# Patient Record
Sex: Female | Born: 1935 | Race: White | Hispanic: No | Smoking: Former smoker
Health system: Southern US, Community
[De-identification: ages and names within clinical notes are randomized; demographics above are authoritative.]

## PROBLEM LIST (undated history)

## (undated) DIAGNOSIS — N189 Chronic kidney disease, unspecified: Secondary | ICD-10-CM

## (undated) DIAGNOSIS — R911 Solitary pulmonary nodule: Secondary | ICD-10-CM

## (undated) DIAGNOSIS — I4819 Other persistent atrial fibrillation: Secondary | ICD-10-CM

## (undated) DIAGNOSIS — E039 Hypothyroidism, unspecified: Secondary | ICD-10-CM

## (undated) DIAGNOSIS — B9681 Helicobacter pylori [H. pylori] as the cause of diseases classified elsewhere: Secondary | ICD-10-CM

## (undated) DIAGNOSIS — C8409 Mycosis fungoides, extranodal and solid organ sites: Secondary | ICD-10-CM

## (undated) DIAGNOSIS — I10 Essential (primary) hypertension: Secondary | ICD-10-CM

## (undated) DIAGNOSIS — E8881 Metabolic syndrome: Secondary | ICD-10-CM

## (undated) DIAGNOSIS — E559 Vitamin D deficiency, unspecified: Secondary | ICD-10-CM

## (undated) DIAGNOSIS — K449 Diaphragmatic hernia without obstruction or gangrene: Secondary | ICD-10-CM

## (undated) DIAGNOSIS — Z9849 Cataract extraction status, unspecified eye: Secondary | ICD-10-CM

## (undated) DIAGNOSIS — E782 Mixed hyperlipidemia: Secondary | ICD-10-CM

## (undated) DIAGNOSIS — D1779 Benign lipomatous neoplasm of other sites: Secondary | ICD-10-CM

## (undated) DIAGNOSIS — I422 Other hypertrophic cardiomyopathy: Secondary | ICD-10-CM

## (undated) DIAGNOSIS — D1771 Benign lipomatous neoplasm of kidney: Secondary | ICD-10-CM

## (undated) DIAGNOSIS — K5730 Diverticulosis of large intestine without perforation or abscess without bleeding: Secondary | ICD-10-CM

## (undated) DIAGNOSIS — E871 Hypo-osmolality and hyponatremia: Secondary | ICD-10-CM

## (undated) DIAGNOSIS — K635 Polyp of colon: Secondary | ICD-10-CM

## (undated) DIAGNOSIS — M1990 Unspecified osteoarthritis, unspecified site: Secondary | ICD-10-CM

## (undated) DIAGNOSIS — IMO0001 Reserved for inherently not codable concepts without codable children: Secondary | ICD-10-CM

## (undated) DIAGNOSIS — M810 Age-related osteoporosis without current pathological fracture: Secondary | ICD-10-CM

## (undated) DIAGNOSIS — D649 Anemia, unspecified: Secondary | ICD-10-CM

## (undated) DIAGNOSIS — K259 Gastric ulcer, unspecified as acute or chronic, without hemorrhage or perforation: Secondary | ICD-10-CM

## (undated) HISTORY — DX: Diaphragmatic hernia without obstruction or gangrene: K44.9

## (undated) HISTORY — DX: Other hypertrophic cardiomyopathy: I42.2

## (undated) HISTORY — DX: Cataract extraction status, unspecified eye: Z98.49

## (undated) HISTORY — DX: Benign lipomatous neoplasm of other sites: D17.79

## (undated) HISTORY — DX: Chronic kidney disease, unspecified: N18.9

## (undated) HISTORY — DX: Polyp of colon: K63.5

## (undated) HISTORY — DX: Metabolic syndrome: E88.810

## (undated) HISTORY — DX: Metabolic syndrome: E88.81

## (undated) HISTORY — DX: Anemia, unspecified: D64.9

## (undated) HISTORY — DX: Other persistent atrial fibrillation: I48.19

## (undated) HISTORY — PX: OTHER SURGICAL HISTORY: SHX169

## (undated) HISTORY — DX: Benign lipomatous neoplasm of kidney: D17.71

---

## 1998-03-17 ENCOUNTER — Other Ambulatory Visit: Admission: RE | Admit: 1998-03-17 | Discharge: 1998-03-17 | Payer: Self-pay | Admitting: Obstetrics and Gynecology

## 1999-05-18 ENCOUNTER — Other Ambulatory Visit: Admission: RE | Admit: 1999-05-18 | Discharge: 1999-05-18 | Payer: Self-pay | Admitting: Obstetrics and Gynecology

## 2000-08-29 ENCOUNTER — Other Ambulatory Visit: Admission: RE | Admit: 2000-08-29 | Discharge: 2000-08-29 | Payer: Self-pay | Admitting: Obstetrics and Gynecology

## 2000-09-19 ENCOUNTER — Encounter: Payer: Self-pay | Admitting: Internal Medicine

## 2001-02-25 ENCOUNTER — Emergency Department (HOSPITAL_COMMUNITY): Admission: EM | Admit: 2001-02-25 | Discharge: 2001-02-25 | Payer: Self-pay | Admitting: Emergency Medicine

## 2001-02-25 ENCOUNTER — Encounter: Payer: Self-pay | Admitting: Emergency Medicine

## 2002-05-18 ENCOUNTER — Other Ambulatory Visit: Admission: RE | Admit: 2002-05-18 | Discharge: 2002-05-18 | Payer: Self-pay | Admitting: Obstetrics and Gynecology

## 2003-12-02 ENCOUNTER — Encounter: Payer: Self-pay | Admitting: Internal Medicine

## 2004-03-29 ENCOUNTER — Encounter: Payer: Self-pay | Admitting: Internal Medicine

## 2004-05-10 ENCOUNTER — Ambulatory Visit: Payer: Self-pay | Admitting: Internal Medicine

## 2004-06-09 ENCOUNTER — Ambulatory Visit: Payer: Self-pay | Admitting: Cardiology

## 2004-06-23 ENCOUNTER — Ambulatory Visit: Payer: Self-pay | Admitting: Cardiovascular Disease

## 2004-11-02 ENCOUNTER — Ambulatory Visit: Payer: Self-pay | Admitting: Internal Medicine

## 2004-12-07 ENCOUNTER — Ambulatory Visit: Payer: Self-pay | Admitting: Cardiology

## 2004-12-14 ENCOUNTER — Ambulatory Visit: Payer: Self-pay | Admitting: Cardiology

## 2005-04-02 ENCOUNTER — Ambulatory Visit: Payer: Self-pay | Admitting: Cardiology

## 2005-04-05 ENCOUNTER — Ambulatory Visit: Payer: Self-pay | Admitting: Cardiology

## 2005-04-11 ENCOUNTER — Ambulatory Visit: Payer: Self-pay | Admitting: Internal Medicine

## 2005-06-22 ENCOUNTER — Ambulatory Visit: Payer: Self-pay | Admitting: Cardiology

## 2005-06-28 ENCOUNTER — Ambulatory Visit: Payer: Self-pay | Admitting: Cardiology

## 2005-12-06 ENCOUNTER — Ambulatory Visit: Payer: Self-pay | Admitting: Internal Medicine

## 2005-12-13 ENCOUNTER — Ambulatory Visit: Payer: Self-pay | Admitting: Cardiology

## 2006-04-24 ENCOUNTER — Ambulatory Visit: Payer: Self-pay | Admitting: Internal Medicine

## 2006-04-24 LAB — CONVERTED CEMR LAB
BUN: 15 mg/dL (ref 6–23)
Creatinine, Ser: 1.2 mg/dL (ref 0.4–1.2)
Creatinine,U: 79.4 mg/dL
Hgb A1c MFr Bld: 5.6 % (ref 4.6–6.0)
Microalb Creat Ratio: 2.5 mg/g (ref 0.0–30.0)
Microalb, Ur: 0.2 mg/dL (ref 0.0–1.9)
Potassium: 3.5 meq/L (ref 3.5–5.1)
TSH: 2.67 microintl units/mL (ref 0.35–5.50)

## 2006-05-10 ENCOUNTER — Ambulatory Visit: Payer: Self-pay | Admitting: Cardiology

## 2006-05-16 ENCOUNTER — Ambulatory Visit: Payer: Self-pay | Admitting: Cardiology

## 2006-05-23 ENCOUNTER — Other Ambulatory Visit: Admission: RE | Admit: 2006-05-23 | Discharge: 2006-05-23 | Payer: Self-pay | Admitting: Obstetrics & Gynecology

## 2006-10-25 ENCOUNTER — Ambulatory Visit: Payer: Self-pay | Admitting: Cardiology

## 2006-10-25 LAB — CONVERTED CEMR LAB
ALT: 20 units/L (ref 0–40)
AST: 19 units/L (ref 0–37)
Albumin: 3.7 g/dL (ref 3.5–5.2)
Alkaline Phosphatase: 69 units/L (ref 39–117)
Bilirubin, Direct: 0.1 mg/dL (ref 0.0–0.3)
Cholesterol: 170 mg/dL (ref 0–200)
HDL: 46.4 mg/dL (ref >39.0)
LDL Cholesterol: 89 mg/dL (ref 0–99)
Total Bilirubin: 0.9 mg/dL (ref 0.3–1.2)
Total CHOL/HDL Ratio: 3.7
Total Protein: 6.8 g/dL (ref 6.0–8.3)
Triglycerides: 173 mg/dL — ABNORMAL HIGH (ref 0–149)
VLDL: 35 mg/dL (ref 0–40)

## 2006-10-31 ENCOUNTER — Ambulatory Visit: Payer: Self-pay | Admitting: *Deleted

## 2006-11-14 DIAGNOSIS — IMO0001 Reserved for inherently not codable concepts without codable children: Secondary | ICD-10-CM

## 2006-12-02 ENCOUNTER — Encounter: Payer: Self-pay | Admitting: Internal Medicine

## 2007-01-20 ENCOUNTER — Ambulatory Visit: Payer: Self-pay | Admitting: Cardiology

## 2007-01-20 LAB — CONVERTED CEMR LAB
ALT: 17 units/L (ref 0–35)
AST: 16 units/L (ref 0–37)
Albumin: 3.6 g/dL (ref 3.5–5.2)
Alkaline Phosphatase: 73 units/L (ref 39–117)
Bilirubin, Direct: 0.1 mg/dL (ref 0.0–0.3)
Cholesterol: 152 mg/dL (ref 0–200)
HDL: 40.2 mg/dL (ref >39.0)
LDL Cholesterol: 82 mg/dL (ref 0–99)
Total Bilirubin: 0.8 mg/dL (ref 0.3–1.2)
Total CHOL/HDL Ratio: 3.8
Total Protein: 6.6 g/dL (ref 6.0–8.3)
Triglycerides: 148 mg/dL (ref 0–149)
VLDL: 30 mg/dL (ref 0–40)

## 2007-01-23 ENCOUNTER — Ambulatory Visit: Payer: Self-pay | Admitting: Cardiology

## 2007-01-23 LAB — CONVERTED CEMR LAB: Total CK: 124 units/L (ref 7–177)

## 2007-04-10 ENCOUNTER — Ambulatory Visit: Payer: Self-pay | Admitting: Cardiology

## 2007-04-10 LAB — CONVERTED CEMR LAB
ALT: 19 units/L (ref 0–35)
AST: 19 units/L (ref 0–37)
Albumin: 3.5 g/dL (ref 3.5–5.2)
Alkaline Phosphatase: 77 units/L (ref 39–117)
Bilirubin, Direct: 0.1 mg/dL (ref 0.0–0.3)
Cholesterol: 168 mg/dL (ref 0–200)
HDL: 41.9 mg/dL (ref >39.0)
LDL Cholesterol: 93 mg/dL (ref 0–99)
Total Bilirubin: 0.6 mg/dL (ref 0.3–1.2)
Total CHOL/HDL Ratio: 4
Total CK: 66 units/L (ref 7–177)
Total Protein: 6.6 g/dL (ref 6.0–8.3)
Triglycerides: 167 mg/dL — ABNORMAL HIGH (ref 0–149)
VLDL: 33 mg/dL (ref 0–40)

## 2007-04-17 ENCOUNTER — Ambulatory Visit: Payer: Self-pay | Admitting: Cardiology

## 2007-04-28 ENCOUNTER — Ambulatory Visit: Payer: Self-pay | Admitting: Internal Medicine

## 2007-04-28 DIAGNOSIS — E039 Hypothyroidism, unspecified: Secondary | ICD-10-CM | POA: Insufficient documentation

## 2007-04-28 DIAGNOSIS — E782 Mixed hyperlipidemia: Secondary | ICD-10-CM

## 2007-04-28 DIAGNOSIS — E8881 Metabolic syndrome: Secondary | ICD-10-CM | POA: Insufficient documentation

## 2007-04-28 DIAGNOSIS — I10 Essential (primary) hypertension: Secondary | ICD-10-CM | POA: Insufficient documentation

## 2007-04-28 DIAGNOSIS — M810 Age-related osteoporosis without current pathological fracture: Secondary | ICD-10-CM | POA: Insufficient documentation

## 2007-04-29 ENCOUNTER — Encounter (INDEPENDENT_AMBULATORY_CARE_PROVIDER_SITE_OTHER): Payer: Self-pay | Admitting: *Deleted

## 2007-05-05 ENCOUNTER — Ambulatory Visit: Payer: Self-pay | Admitting: Internal Medicine

## 2007-05-05 ENCOUNTER — Encounter (INDEPENDENT_AMBULATORY_CARE_PROVIDER_SITE_OTHER): Payer: Self-pay | Admitting: *Deleted

## 2007-05-16 ENCOUNTER — Ambulatory Visit: Payer: Self-pay | Admitting: Internal Medicine

## 2007-05-16 LAB — CONVERTED CEMR LAB
Ferritin: 7.8 ng/mL — ABNORMAL LOW (ref 10.0–291.0)
Iron: 43 ug/dL (ref 42–145)
Saturation Ratios: 9.3 % — ABNORMAL LOW (ref 20.0–50.0)
Transferrin: 329.8 mg/dL (ref >=212.0)

## 2007-05-21 ENCOUNTER — Encounter: Payer: Self-pay | Admitting: Internal Medicine

## 2007-06-02 DIAGNOSIS — M1990 Unspecified osteoarthritis, unspecified site: Secondary | ICD-10-CM | POA: Insufficient documentation

## 2007-06-26 ENCOUNTER — Ambulatory Visit: Payer: Self-pay | Admitting: Internal Medicine

## 2007-06-28 LAB — CONVERTED CEMR LAB
Basophils Relative: 0.4 % (ref 0.0–1.0)
Folate: 10.6 ng/mL
Monocytes Relative: 9.6 % (ref 3.0–11.0)
Platelets: 234 10*3/uL (ref 150–400)
RDW: 13.8 % (ref 11.5–14.6)
Vitamin B-12: 469 pg/mL (ref 211–911)

## 2007-06-30 ENCOUNTER — Ambulatory Visit: Payer: Self-pay | Admitting: Internal Medicine

## 2007-06-30 ENCOUNTER — Encounter: Payer: Self-pay | Admitting: Internal Medicine

## 2007-06-30 ENCOUNTER — Encounter (INDEPENDENT_AMBULATORY_CARE_PROVIDER_SITE_OTHER): Payer: Self-pay | Admitting: *Deleted

## 2007-06-30 LAB — HM COLONOSCOPY

## 2007-07-10 ENCOUNTER — Ambulatory Visit: Payer: Self-pay

## 2007-07-10 LAB — CONVERTED CEMR LAB
ALT: 17 units/L (ref 0–35)
AST: 16 units/L (ref 0–37)
Albumin: 3.5 g/dL (ref 3.5–5.2)
Cholesterol: 217 mg/dL (ref 0–200)
Direct LDL: 134.8 mg/dL
HDL: 37.7 mg/dL — ABNORMAL LOW (ref >39.0)
Total Bilirubin: 1 mg/dL (ref 0.3–1.2)

## 2007-07-17 ENCOUNTER — Ambulatory Visit: Payer: Self-pay | Admitting: Cardiology

## 2007-07-25 HISTORY — PX: OTHER SURGICAL HISTORY: SHX169

## 2007-08-27 ENCOUNTER — Ambulatory Visit: Payer: Self-pay | Admitting: Cardiology

## 2007-08-27 LAB — CONVERTED CEMR LAB
ALT: 16 units/L (ref 0–35)
Bilirubin, Direct: 0.1 mg/dL (ref 0.0–0.3)
Total Bilirubin: 0.5 mg/dL (ref 0.3–1.2)
Total CHOL/HDL Ratio: 2.9
VLDL: 26 mg/dL (ref 0–40)

## 2007-08-28 ENCOUNTER — Ambulatory Visit: Payer: Self-pay | Admitting: Internal Medicine

## 2007-12-18 ENCOUNTER — Ambulatory Visit: Payer: Self-pay | Admitting: Cardiology

## 2007-12-18 LAB — CONVERTED CEMR LAB
Albumin: 3.7 g/dL (ref 3.5–5.2)
Alkaline Phosphatase: 69 units/L (ref 39–117)
HDL: 43.3 mg/dL (ref >39.0)
LDL Cholesterol: 62 mg/dL (ref 0–99)
Total CHOL/HDL Ratio: 3
Total Protein: 6.8 g/dL (ref 6.0–8.3)
Triglycerides: 114 mg/dL (ref 0–149)
VLDL: 23 mg/dL (ref 0–40)

## 2007-12-25 ENCOUNTER — Ambulatory Visit: Payer: Self-pay | Admitting: Cardiology

## 2008-01-08 ENCOUNTER — Encounter: Payer: Self-pay | Admitting: Internal Medicine

## 2008-01-16 ENCOUNTER — Encounter: Payer: Self-pay | Admitting: Internal Medicine

## 2008-05-17 ENCOUNTER — Telehealth (INDEPENDENT_AMBULATORY_CARE_PROVIDER_SITE_OTHER): Payer: Self-pay | Admitting: *Deleted

## 2008-05-21 ENCOUNTER — Ambulatory Visit: Payer: Self-pay | Admitting: Internal Medicine

## 2008-05-21 DIAGNOSIS — C8409 Mycosis fungoides, extranodal and solid organ sites: Secondary | ICD-10-CM | POA: Insufficient documentation

## 2008-05-21 DIAGNOSIS — K5730 Diverticulosis of large intestine without perforation or abscess without bleeding: Secondary | ICD-10-CM | POA: Insufficient documentation

## 2008-05-25 ENCOUNTER — Encounter (INDEPENDENT_AMBULATORY_CARE_PROVIDER_SITE_OTHER): Payer: Self-pay | Admitting: *Deleted

## 2008-09-09 ENCOUNTER — Ambulatory Visit: Payer: Self-pay | Admitting: Cardiology

## 2008-09-09 LAB — CONVERTED CEMR LAB
AST: 24 units/L (ref 0–37)
Albumin: 3.8 g/dL (ref 3.5–5.2)
Alkaline Phosphatase: 80 units/L (ref 39–117)
Cholesterol: 201 mg/dL — ABNORMAL HIGH (ref 0–200)
Direct LDL: 128.5 mg/dL
Total CHOL/HDL Ratio: 4
Total Protein: 6.9 g/dL (ref 6.0–8.3)
Triglycerides: 155 mg/dL — ABNORMAL HIGH (ref 0.0–149.0)

## 2008-09-13 ENCOUNTER — Ambulatory Visit: Payer: Self-pay | Admitting: Internal Medicine

## 2008-10-06 ENCOUNTER — Ambulatory Visit: Payer: Self-pay | Admitting: Cardiology

## 2008-10-06 ENCOUNTER — Encounter (INDEPENDENT_AMBULATORY_CARE_PROVIDER_SITE_OTHER): Payer: Self-pay | Admitting: *Deleted

## 2008-10-06 LAB — CONVERTED CEMR LAB
ALT: 15 units/L (ref 0–35)
AST: 21 units/L (ref 0–37)
Alkaline Phosphatase: 70 units/L (ref 39–117)
Bilirubin, Direct: 0 mg/dL (ref 0.0–0.3)
Total Bilirubin: 0.7 mg/dL (ref 0.3–1.2)
Total CHOL/HDL Ratio: 3

## 2008-10-11 ENCOUNTER — Ambulatory Visit: Payer: Self-pay | Admitting: Cardiovascular Disease

## 2008-10-11 DIAGNOSIS — E785 Hyperlipidemia, unspecified: Secondary | ICD-10-CM | POA: Insufficient documentation

## 2009-01-01 ENCOUNTER — Encounter: Payer: Self-pay | Admitting: Cardiology

## 2009-01-21 ENCOUNTER — Ambulatory Visit: Payer: Self-pay | Admitting: Internal Medicine

## 2009-01-21 DIAGNOSIS — Z860100 Personal history of colon polyps, unspecified: Secondary | ICD-10-CM | POA: Insufficient documentation

## 2009-01-21 DIAGNOSIS — E559 Vitamin D deficiency, unspecified: Secondary | ICD-10-CM

## 2009-01-21 DIAGNOSIS — N1832 Chronic kidney disease, stage 3b: Secondary | ICD-10-CM | POA: Insufficient documentation

## 2009-01-21 DIAGNOSIS — D649 Anemia, unspecified: Secondary | ICD-10-CM

## 2009-01-21 DIAGNOSIS — N183 Chronic kidney disease, stage 3 (moderate): Secondary | ICD-10-CM

## 2009-01-25 ENCOUNTER — Encounter: Payer: Self-pay | Admitting: Internal Medicine

## 2009-01-28 LAB — CONVERTED CEMR LAB
BUN: 22 mg/dL (ref 6–23)
Basophils Absolute: 0 10*3/uL (ref 0.0–0.1)
Eosinophils Absolute: 0.1 10*3/uL (ref 0.0–0.7)
HCT: 36.2 % (ref 36.0–46.0)
Hemoglobin: 12.1 g/dL (ref 12.0–15.0)
Lymphs Abs: 1.4 10*3/uL (ref 0.7–4.0)
MCHC: 33.6 g/dL (ref 30.0–36.0)
MCV: 87.6 fL (ref 78.0–100.0)
Monocytes Absolute: 0.5 10*3/uL (ref 0.1–1.0)
Monocytes Relative: 7.7 % (ref 3.0–12.0)
Neutro Abs: 4.3 10*3/uL (ref 1.4–7.7)
Platelets: 180 10*3/uL (ref 150.0–400.0)
Potassium: 5.3 meq/L — ABNORMAL HIGH (ref 3.5–5.1)
RDW: 13.6 % (ref 11.5–14.6)
Transferrin: 279.2 mg/dL (ref 212.0–360.0)

## 2009-02-01 ENCOUNTER — Encounter (INDEPENDENT_AMBULATORY_CARE_PROVIDER_SITE_OTHER): Payer: Self-pay | Admitting: *Deleted

## 2009-02-01 ENCOUNTER — Telehealth (INDEPENDENT_AMBULATORY_CARE_PROVIDER_SITE_OTHER): Payer: Self-pay | Admitting: *Deleted

## 2009-02-15 ENCOUNTER — Telehealth (INDEPENDENT_AMBULATORY_CARE_PROVIDER_SITE_OTHER): Payer: Self-pay | Admitting: *Deleted

## 2009-02-18 ENCOUNTER — Telehealth (INDEPENDENT_AMBULATORY_CARE_PROVIDER_SITE_OTHER): Payer: Self-pay | Admitting: *Deleted

## 2009-03-22 ENCOUNTER — Ambulatory Visit: Payer: Self-pay | Admitting: Internal Medicine

## 2009-03-23 ENCOUNTER — Encounter (INDEPENDENT_AMBULATORY_CARE_PROVIDER_SITE_OTHER): Payer: Self-pay | Admitting: *Deleted

## 2009-04-06 ENCOUNTER — Telehealth: Payer: Self-pay | Admitting: Internal Medicine

## 2009-05-26 ENCOUNTER — Ambulatory Visit: Payer: Self-pay | Admitting: Internal Medicine

## 2009-05-30 ENCOUNTER — Encounter (INDEPENDENT_AMBULATORY_CARE_PROVIDER_SITE_OTHER): Payer: Self-pay | Admitting: *Deleted

## 2009-07-18 ENCOUNTER — Ambulatory Visit: Payer: Self-pay | Admitting: Cardiology

## 2009-07-18 DIAGNOSIS — R9431 Abnormal electrocardiogram [ECG] [EKG]: Secondary | ICD-10-CM

## 2009-08-09 ENCOUNTER — Ambulatory Visit (HOSPITAL_COMMUNITY): Admission: RE | Admit: 2009-08-09 | Discharge: 2009-08-09 | Payer: Self-pay | Admitting: Cardiology

## 2009-08-09 ENCOUNTER — Ambulatory Visit: Payer: Self-pay

## 2009-08-09 ENCOUNTER — Ambulatory Visit: Payer: Self-pay | Admitting: Cardiovascular Disease

## 2009-08-23 ENCOUNTER — Ambulatory Visit: Payer: Self-pay | Admitting: Cardiology

## 2009-08-23 DIAGNOSIS — I422 Other hypertrophic cardiomyopathy: Secondary | ICD-10-CM

## 2010-01-09 ENCOUNTER — Telehealth (INDEPENDENT_AMBULATORY_CARE_PROVIDER_SITE_OTHER): Payer: Self-pay | Admitting: *Deleted

## 2010-01-26 ENCOUNTER — Encounter: Payer: Self-pay | Admitting: Internal Medicine

## 2010-04-11 ENCOUNTER — Encounter: Payer: Self-pay | Admitting: Internal Medicine

## 2010-06-27 ENCOUNTER — Encounter: Payer: Self-pay | Admitting: Internal Medicine

## 2010-06-27 ENCOUNTER — Ambulatory Visit
Admission: RE | Admit: 2010-06-27 | Discharge: 2010-06-27 | Payer: Self-pay | Source: Home / Self Care | Attending: Internal Medicine | Admitting: Internal Medicine

## 2010-06-27 ENCOUNTER — Other Ambulatory Visit: Payer: Self-pay | Admitting: Internal Medicine

## 2010-06-27 LAB — CBC WITH DIFFERENTIAL/PLATELET
Basophils Absolute: 0 10*3/uL (ref 0.0–0.1)
Basophils Relative: 0.5 % (ref 0.0–3.0)
Eosinophils Absolute: 0 10*3/uL (ref 0.0–0.7)
Eosinophils Relative: 0.2 % (ref 0.0–5.0)
HCT: 32 % — ABNORMAL LOW (ref 36.0–46.0)
Hemoglobin: 11 g/dL — ABNORMAL LOW (ref 12.0–15.0)
Lymphocytes Relative: 24.4 % (ref 12.0–46.0)
Lymphs Abs: 1.3 10*3/uL (ref 0.7–4.0)
MCHC: 34.5 g/dL (ref 30.0–36.0)
MCV: 88.1 fl (ref 78.0–100.0)
Monocytes Absolute: 0.3 10*3/uL (ref 0.1–1.0)
Monocytes Relative: 6.4 % (ref 3.0–12.0)
Neutro Abs: 3.6 10*3/uL (ref 1.4–7.7)
Neutrophils Relative %: 68.5 % (ref 43.0–77.0)
Platelets: 229 10*3/uL (ref 150.0–400.0)
RBC: 3.63 Mil/uL — ABNORMAL LOW (ref 3.87–5.11)
RDW: 13.6 % (ref 11.5–14.6)
WBC: 5.2 10*3/uL (ref 4.5–10.5)

## 2010-06-27 LAB — BASIC METABOLIC PANEL
BUN: 17 mg/dL (ref 6–23)
CO2: 27 mEq/L (ref 19–32)
Calcium: 9.3 mg/dL (ref 8.4–10.5)
Chloride: 103 mEq/L (ref 96–112)
Creatinine, Ser: 1.2 mg/dL (ref 0.4–1.2)
GFR: 45.9 mL/min — ABNORMAL LOW (ref >60.00)
Glucose, Bld: 81 mg/dL (ref 70–99)
Potassium: 4.2 mEq/L (ref 3.5–5.1)
Sodium: 137 mEq/L (ref 135–145)

## 2010-06-27 LAB — LIPID PANEL
Cholesterol: 140 mg/dL (ref 0–200)
HDL: 61.1 mg/dL (ref >39.00)
LDL Cholesterol: 60 mg/dL (ref 0–99)
Total CHOL/HDL Ratio: 2
Triglycerides: 93 mg/dL (ref 0.0–149.0)
VLDL: 18.6 mg/dL (ref 0.0–40.0)

## 2010-06-27 LAB — HEPATIC FUNCTION PANEL
ALT: 16 U/L (ref 0–35)
AST: 17 U/L (ref 0–37)
Albumin: 3.9 g/dL (ref 3.5–5.2)
Alkaline Phosphatase: 58 U/L (ref 39–117)
Bilirubin, Direct: 0.1 mg/dL (ref 0.0–0.3)
Total Bilirubin: 1 mg/dL (ref 0.3–1.2)
Total Protein: 6.6 g/dL (ref 6.0–8.3)

## 2010-06-27 LAB — TSH: TSH: 2.08 u[IU]/mL (ref 0.35–5.50)

## 2010-06-28 LAB — CONVERTED CEMR LAB: Vit D, 25-Hydroxy: 51 ng/mL (ref 30–89)

## 2010-07-02 LAB — CONVERTED CEMR LAB
AST: 27 units/L (ref 0–37)
Albumin: 4.1 g/dL (ref 3.5–5.2)
Albumin: 4.1 g/dL (ref 3.5–5.2)
Alkaline Phosphatase: 70 units/L (ref 39–117)
Alkaline Phosphatase: 83 units/L (ref 39–117)
BUN: 21 mg/dL (ref 6–23)
BUN: 26 mg/dL — ABNORMAL HIGH (ref 6–23)
BUN: 28 mg/dL — ABNORMAL HIGH (ref 6–23)
Basophils Absolute: 0 10*3/uL (ref 0.0–0.1)
Basophils Absolute: 0 10*3/uL (ref 0.0–0.1)
Basophils Absolute: 0 10*3/uL (ref 0.0–0.1)
Bilirubin, Direct: 0 mg/dL (ref 0.0–0.3)
CO2: 26 meq/L (ref 19–32)
Calcium: 9.8 mg/dL (ref 8.4–10.5)
Chloride: 104 meq/L (ref 96–112)
Chloride: 104 meq/L (ref 96–112)
Cholesterol, target level: 200 mg/dL
Cholesterol: 157 mg/dL (ref 0–200)
Cholesterol: 165 mg/dL (ref 0–200)
Creatinine, Ser: 1.2 mg/dL (ref 0.4–1.2)
Creatinine, Ser: 1.4 mg/dL — ABNORMAL HIGH (ref 0.4–1.2)
Eosinophils Absolute: 0.1 10*3/uL (ref 0.0–0.7)
Eosinophils Absolute: 0.1 10*3/uL (ref 0.0–0.7)
GFR calc Af Amer: 47 mL/min
GFR calc Af Amer: 47 mL/min
GFR calc non Af Amer: 39 mL/min
HCT: 34.3 % — ABNORMAL LOW (ref 36.0–46.0)
HDL: 58.7 mg/dL (ref >39.0)
HDL: 72.8 mg/dL (ref >39.00)
Hemoglobin: 11.1 g/dL — ABNORMAL LOW (ref 12.0–15.0)
Hgb A1c MFr Bld: 5.9 % (ref 4.6–6.0)
LDL Cholesterol: 72 mg/dL (ref 0–99)
MCHC: 33.5 g/dL (ref 30.0–36.0)
MCHC: 33.6 g/dL (ref 30.0–36.0)
MCHC: 34.2 g/dL (ref 30.0–36.0)
MCV: 83.2 fL (ref 78.0–100.0)
MCV: 88.5 fL (ref 78.0–100.0)
Monocytes Absolute: 0.4 10*3/uL (ref 0.1–1.0)
Monocytes Absolute: 0.5 10*3/uL (ref 0.1–1.0)
Monocytes Absolute: 0.5 10*3/uL (ref 0.2–0.7)
Monocytes Relative: 7.3 % (ref 3.0–11.0)
Neutro Abs: 4.1 10*3/uL (ref 1.4–7.7)
Neutrophils Relative %: 63.4 % (ref 43.0–77.0)
Neutrophils Relative %: 67.5 % (ref 43.0–77.0)
Platelets: 217 10*3/uL (ref 150.0–400.0)
Platelets: 220 10*3/uL (ref 150–400)
Potassium: 3.9 meq/L (ref 3.5–5.1)
Potassium: 4 meq/L (ref 3.5–5.1)
RDW: 13.4 % (ref 11.5–14.6)
RDW: 14.9 % — ABNORMAL HIGH (ref 11.5–14.6)
TSH: 0.69 microintl units/mL (ref 0.35–5.50)
Total Bilirubin: 0.8 mg/dL (ref 0.3–1.2)
Total Bilirubin: 1 mg/dL (ref 0.3–1.2)
Triglycerides: 100 mg/dL (ref 0.0–149.0)
Triglycerides: 124 mg/dL (ref 0–149)
VLDL: 25 mg/dL (ref 0–40)
Vit D, 1,25-Dihydroxy: 34 (ref 30–89)
Vit D, 25-Hydroxy: 32 ng/mL (ref 30–89)
WBC: 6.1 10*3/uL (ref 4.5–10.5)

## 2010-07-04 NOTE — Assessment & Plan Note (Signed)
Summary: NP6/ABN/EKG      Allergies Added:   Primary Provider:  Marga Melnick MD  CC:  new patient.  Abnormal ekg.  History of Present Illness: 77 yo with history of HTN, hyperlipidemia, and abnormal ECG presents for cardiology evaluation.  Patient was noted to have an abnormal ECG back in 2003, when she was noted to have fairly widespread anterior T wave inversions.  Myoview at that time showed no evidence for ischemia or infarction.  ECG in 2004 showed anterior T wave inversions.  She was seen by Dr. [name] in 12/10, and ECG showed lateral and anterior T wave inversions that seemed somewhat progressive from the past, so she was referred for cardiology evaluation.  Today, her ECG actually shows inferior T wave inversions with normal-appearing anterior T waves.    Patient has been doing well recently.  She has not had any episodes of chest pain or tightness.  She has good exercise tolerance with no unusual exertional shortness of breath.  She vacuums, sweeps, and mops at home with no problem.  She is able to climb a flight of steps without problem.  She has never had significant lightheaded spells or syncope.    ECG (2003, 2004): anterior T wave inversions ECG (12/10): anterior and lateral T wave inversions ECG (07/18/09): NSR, LVH,  inferior T wave inversions  Labs (12/10): LDL 72, HDL 73, K 4.3, creatinine 1.2    Current Medications (verified): 1)  Synthroid 75 Mcg Tabs (Levothyroxine Sodium) .Marland Kitchen.. 1 Daily Except 1/2 Tues & Sat 2)  Mobic 7.5 Mg Tabs (Meloxicam) .Marland Kitchen.. 1 Tab Once Daily To Two Times A Day Prn 3)  Furosemide 40 Mg  Tabs (Furosemide) .Marland Kitchen.. 1 By Mouth Once Daily Prn 4)  Lisinopril 20 Mg  Tabs (Lisinopril) .... Take 1 Tablet By Mouth Daily 5)  Centrum With Lutein .... Once Daily 6)  Caltrate 600 With Vit. D .... Once Daily 7)  Baby Aspirin 81 Mg  Chew (Aspirin) .... Take 1 Tablet By Mouth Daily As Directed 8)  Vitamin D3 2000 Unit Caps (Cholecalciferol) .Marland Kitchen.. 1 By Mouth Once  Daily 9)  Crestor 5 Mg  Tabs (Rosuvastatin Calcium) .... Take 1 Tab By Mouth Daily For Cholesterol. 10)  Lorazepam 0.5 Mg Tabs (Lorazepam) .... Take 1 Every 8 Hrs As Needed  Allergies (verified): 1)  ! Celebrex 2)  ! Vytorin 3)  ! * Achromycin 4)  ! Gaye Pollack  Past History:  Past Medical History: 1. OSTEOARTHRITIS (ICD-715.90) 2. HYPERTENSION, ESSENTIAL NOS (ICD-401.9) 3. OSTEOPOROSIS (ICD-733.00) 4. HYPERLIPIDEMIA (ICD-272.2)                                                                                                                             5. METABOLIC SYNDROME X (ICD-277.7) 6. FIBROMYALGIA (ICD-729.1), PMH of 7. HYPOTHYROIDISM (ICD-244.9) 8. SKIN CANCER, HX OF (ICD-V10.83) , MYCOSIS FUNGOIDES, [facility] & Dr Nicholas Lose 9. Diverticulosis, colon 10. Vitamin D deficiency 11. Mild CKD, last creatinine 1.2 (  12/10) 12. Colonic polyps, hx of, Dr Juanda Chance 13. Anemia-NOS 14. Abnormal ECG: Past workup has included a myoview in 2003 showing EF 78% and no evidence for ischemia or infarction.    Family History: Father: neg Mother: DM Siblings: none MGM:  HTN, CVA Maternal uncle:  MI  No premature CAD in 1st degree relatives  Social History: Alcohol use-yes-rare Low carb diet Retired Married, daughter also lives in [place] Quit tobacco 40+ years ago  Vital Signs:  Patient profile:   77 year old female Height:      58.75 inches Weight:      151 pounds BMI:     30.87 Pulse rate:   57 / minute Pulse rhythm:   regular BP sitting:   130 / 58  (left arm) Cuff size:   large  Vitals Entered By: Judithe Modest CMA (July 18, 2009 2:42 PM)  Physical Exam  General:  Well developed, well nourished, in no acute distress. Head:  normocephalic and atraumatic Nose:  no deformity, discharge, inflammation, or lesions Mouth:  Teeth, gums and palate normal. Oral mucosa normal. Neck:  Neck supple, no JVD. No masses, thyromegaly or abnormal cervical nodes. Lungs:  Clear bilaterally to  auscultation and percussion. Heart:  Non-displaced PMI, chest non-tender; regular rate and rhythm, S1, S2 without murmurs, rubs or gallops. Carotid upstroke normal, no bruit. Pedals normal pulses. No edema, no varicosities. Abdomen:  Bowel sounds positive; abdomen soft and non-tender without masses, organomegaly, or hernias noted. No hepatosplenomegaly. Msk:  Back normal, normal gait. Muscle strength and tone normal. Extremities:  No clubbing or cyanosis. Neurologic:  Alert and oriented x 3. Skin:  Intact without lesions or rashes. Psych:  Normal affect.   Impression & Recommendations:  Problem # 1:  ABNORMAL ELECTROCARDIOGRAM (ICD-794.31) The patient has had an abnormal ECG over the years.  In the past, she has had anterior and lateral T wave inversions.  Today she has inferior T wave inversions.  She had a negative myoview in 2003 in the setting of anterior T wave inversions.  She currently is doing very well with no exertional symptoms.  Given her lack of symptoms, I will plan on getting an echocardiogram to make sure that LV function is preserved and that there are no gross wall motion abnormalities.  If this looks normal, I think that the best course will be continued aggressive management of risk factors as Dr. [name] is doing.  Lipids are under very good control and BP is controlled. Continue ASA 81 mg daily.   Problem # 2:  HYPERLIPIDEMIA-MIXED (ICD-272.4) Excellent lipid profile in 12/10.   Problem # 3:  HYPERTENSION, ESSENTIAL NOS (ICD-401.9) BP is under good control.  Continue lisinopril.   Other Orders: EKG w/ Interpretation (93000) Echocardiogram (Echo)  Patient Instructions: 1)  Your physician has requested that you have an echocardiogram.  Echocardiography is a painless test that uses sound waves to create images of your heart. It provides your doctor with information about the size and shape of your heart and how well your heart's chambers and valves are working.  This  procedure takes approximately one hour. There are no restrictions for this procedure. 2)  Your physician recommends that you schedule a follow-up appointment as needed with Dr Marca Ancona

## 2010-07-04 NOTE — Assessment & Plan Note (Signed)
Summary: rov    Visit Type:  Follow-up Primary Provider:  Marga Melnick MD   History of Present Illness: 77 yo with history of HTN, hyperlipidemia, and abnormal ECG returns for cardiology followup.  Patient was noted to have an abnormal ECG back in 2003, when she was noted to have fairly widespread anterior T wave inversions.  Myoview at that time showed no evidence for ischemia or infarction.  ECG in 2004 showed anterior T wave inversions.  She was seen by Dr. [name] in 12/10, and ECG showed lateral and anterior T wave inversions that seemed somewhat progressive from the past, so she was referred for cardiology evaluation.  At last appointment,  her ECG actually showed inferior T wave inversions with normal-appearing anterior T waves.    Patient has been doing well recently.  She has not had any episodes of chest pain or tightness.  She has good exercise tolerance with no unusual exertional shortness of breath.  She vacuums, sweeps, and mops at home with no problem.  She is able to climb a flight of steps without problem.  She has never had significant lightheaded spells or syncope.    I ordered an echocardiogram.  I reviewed this study today. This was done with echo contrast for better view of the LV apex.  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  This finding classically is associated with widespread T wave inversions, which the patient has on ECG.   ECG (2003, 2004): anterior T wave inversions ECG (12/10): anterior and lateral T wave inversions ECG (07/18/09): NSR, LVH,  inferior T wave inversions  Labs (12/10): LDL 72, HDL 73, K 4.3, creatinine 1.2    Current Medications (verified): 1)  Synthroid 75 Mcg Tabs (Levothyroxine Sodium) .Marland Kitchen.. 1 Daily Except 1/2 Tues & Sat 2)  Mobic 7.5 Mg Tabs (Meloxicam) .Marland Kitchen.. 1 Tab Once Daily To Two Times A Day Prn 3)  Furosemide 40 Mg  Tabs (Furosemide) .Marland Kitchen.. 1 By Mouth Once Daily Prn 4)  Lisinopril 20 Mg  Tabs (Lisinopril) ....  Take 1 Tablet By Mouth Daily 5)  Centrum With Lutein .... Once Daily 6)  Caltrate 600 With Vit. D .... Once Daily 7)  Baby Aspirin 81 Mg  Chew (Aspirin) .... Take 1 Tablet By Mouth Daily As Directed 8)  Vitamin D3 2000 Unit Caps (Cholecalciferol) .Marland Kitchen.. 1 By Mouth Once Daily 9)  Crestor 5 Mg  Tabs (Rosuvastatin Calcium) .... Take 1 Tab By Mouth Daily For Cholesterol. 10)  Lorazepam 0.5 Mg Tabs (Lorazepam) .... Take 1 Every 8 Hrs As Needed  Allergies: 1)  ! Celebrex 2)  ! Vytorin 3)  ! * Achromycin 4)  ! Gaye Pollack  Past History:  Past Medical History: 1. OSTEOARTHRITIS (ICD-715.90) 2. HYPERTENSION, ESSENTIAL NOS (ICD-401.9) 3. OSTEOPOROSIS (ICD-733.00) 4. HYPERLIPIDEMIA (ICD-272.2)  5. METABOLIC SYNDROME X (ICD-277.7) 6. FIBROMYALGIA (ICD-729.1), PMH of 7. HYPOTHYROIDISM (ICD-244.9) 8. SKIN CANCER, HX OF (ICD-V10.83) , MYCOSIS FUNGOIDES, [facility] & Dr Nicholas Lose 9. Diverticulosis, colon 10. Vitamin D deficiency 11. Mild CKD, last creatinine 1.2 (12/10) 12. Colonic polyps, hx of, Dr Juanda Chance 13. Anemia-NOS 14. Myoview in 2003 showing EF 78% and no evidence for ischemia or infarction.   15. Apical hypertrophic cardiomyopathy: Widespread T wave inversions on ECG.  Echo (3/11) showed EF 55-60% with LV asymmetric apical hypertrophy confirmed by echo contrast, moderate diastolic dysfunction, mild mitral regurgitation, mild left atrial enlargement, PA systolic pressure 42 mmHg.   Family History: Reviewed history from 07/18/2009 and no changes required. Father: neg Mother: DM Siblings: none MGM:  HTN, CVA Maternal uncle:  MI  No premature CAD in 1st degree relatives  Social History: Reviewed history from 07/18/2009 and no changes required. Alcohol use-yes-rare Low carb diet Retired Married, daughter also lives in [place] Quit tobacco 40+ years ago  Review of Systems        All systems reviewed and negative except as per HPI.   Vital Signs:  Patient profile:   77 year old female Height:      58.75 inches Weight:      153 pounds BMI:     31.28 Pulse rate:   68 / minute BP sitting:   130 / 60  (right arm)  Vitals Entered By: Laurance Flatten CMA (August 23, 2009 2:02 PM)  Physical Exam  General:  Well developed, well nourished, in no acute distress. Neck:  Neck supple, no JVD. No masses, thyromegaly or abnormal cervical nodes. Lungs:  Clear bilaterally to auscultation and percussion. Heart:  Non-displaced PMI, chest non-tender; regular rate and rhythm, S1, S2 without murmurs, rubs. +S4. Carotid upstroke normal, no bruit. Pedals normal pulses. No edema, no varicosities. Abdomen:  Bowel sounds positive; abdomen soft and non-tender without masses, organomegaly, or hernias noted. No hepatosplenomegaly. Extremities:  No clubbing or cyanosis. Neurologic:  Alert and oriented x 3. Psych:  Normal affect.   Impression & Recommendations:  Problem # 1:  HYPERTROPHIC CARDIOMYOPATHY (ICD-425.1) Patient's echo and ECG are strongly suggestive of apical variant hypertrophic cardiomyopathy.  This variant tends to be more benign than other variants, with lower risk of cardiac events.  She is essentially asymptomatic.  She does have moderate diastolic dysfunction by echo but does not appear volume overloaded.  She has Lasix for as needed use which I think is reasonable.  Normally I would confirm this diagnosis with a cardiac MRI, but she is very claustrophobic and was unable to complete an MRI in the past.  There is no specific treatment needed at this time and no evidence that a beta blocker would be helpful with this particular variant of HCM.  She will need to be monitored for signs and symptoms of diastolic CHF which I think is probably her main risk.

## 2010-07-04 NOTE — Miscellaneous (Signed)
Summary: [facility]   Imported By: Lanelle Bal 04/20/2010 13:19:00  _____________________________________________________________________  External Attachment:    Type:   Image     Comment:   External Document

## 2010-07-04 NOTE — Progress Notes (Signed)
Summary: refill  Phone Note Refill Request Message from:  Fax from Pharmacy on January 09, 2010 2:16 PM  Refills Requested: Medication #1:  LORAZEPAM 0.5 MG TABS take 1 every 8 hrs as needed. Hettie Holstein [id number]  Initial call taken by: Okey Regal [place],  January 09, 2010 2:16 PM    Prescriptions: LORAZEPAM 0.5 MG TABS (LORAZEPAM) take 1 every 8 hrs as needed  #60 x 1   Entered by:   Shonna Chock CMA   Authorized by:   Marga Melnick MD   Signed by:   Shonna Chock CMA on 01/09/2010   Method used:   Printed then faxed to ...       [facility] (retail)       [address]       [place], [place]  [address]       Ph: [phone]       Fax: [phone]   RxID:   [id number]

## 2010-07-06 NOTE — Assessment & Plan Note (Signed)
Summary: yearly check/cbs   Vital Signs:  Patient profile:   77 year old female Height:      58.5 inches Weight:      148.6 pounds BMI:     30.64 Temp:     97.7 degrees F oral Pulse rate:   76 / minute Resp:     14 per minute BP sitting:   122 / 70  (left arm) Cuff size:   large  Vitals Entered By: Shonna Chock CMA (June 27, 2010 1:09 PM) CC: CPX   Vision Screening:      Vision Comments: Eye appointment with Dr.Hecker beginning of Jan 2012   Primary Care Provider:  Marga Melnick MD  CC:  CPX .  History of Present Illness: Here for Medicare AWV: 1.Risk factors based on Past M, S, F history:see Diagnoses; chart updated 2.Physical Activities walking :infrequently ; tv exercises daily 3.Depression/mood: denied ; she lost husband in Oct but has adjusted well, good support group in place 4.Hearing:decreased to whisper @ 6 ft on L 5.ADL's: no limitations 6.Fall Risk: no risks 7.Home Safety: safety proofed 8.Height, weight, &visual acuity: wall chart read @ 6 ft 9.Counseling: POA & Living Will in place 10.Labs ordered based on risk factors: see Orders  11.Referral Coordination: asked to consider Audiology referral 12. Care Plan: see Instructions 13.Cognitive Assessment: Oriented X 3; memory & recall  intact ; "WORLD" spelled backwards  ; mood & affect normal.    Hyperlipidemia Follow-Up:  The patient denies muscle aches, GI upset, abdominal pain, flushing, itching, constipation, diarrhea, and fatigue.  The patient denies the following symptoms: chest pain/pressure, exercise intolerance, dypsnea, palpitations, syncope, and pedal edema.  Compliance with medications (by patient report) has been near 100%.  Dietary compliance has been fair.  The patient reports exercising daily.  Adjunctive measures currently used by the patient include ASA and fish oil supplements.      Hypertension Follow-Up: The patient denies lightheadedness, urinary frequency, and headaches.  Compliance with  medications (by patient report) has been near 100%.  Adjunctive measures currently used by the patient include salt restriction.    Preventive Screening-Counseling & Management  Alcohol-Tobacco     Alcohol drinks/day: 0     Smoking Status: quit > 6 months     Packs/Day: 1/4 @ max     Year Quit: 1962  Caffeine-Diet-Exercise     Caffeine use/day: 1-2 cups/ day  Hep-HIV-STD-Contraception     Dental Visit-last 6 months yes     Sun Exposure-Excessive: no  Safety-Violence-Falls     Seat Belt Use: yes     Smoke Detectors: yes      Blood Transfusions:  no.        Travel History:  never.    Current Medications (verified): 1)  Synthroid 75 Mcg Tabs (Levothyroxine Sodium) .Marland Kitchen.. 1 Daily Except 1/2 Tues & Sat 2)  Mobic 7.5 Mg Tabs (Meloxicam) .Marland Kitchen.. 1 Tab Once Daily To Two Times A Day Prn 3)  Furosemide 40 Mg  Tabs (Furosemide) .Marland Kitchen.. 1 By Mouth Once Daily Prn 4)  Lisinopril 20 Mg  Tabs (Lisinopril) .... Take 1 Tablet By Mouth Daily 5)  Baby Aspirin 81 Mg  Chew (Aspirin) .... Take 1 Tablet By Mouth Daily As Directed 6)  Vitamin D3 2000 Unit Caps (Cholecalciferol) .Marland Kitchen.. 1 By Mouth Once Daily 7)  Crestor 5 Mg  Tabs (Rosuvastatin Calcium) .... Take 1 Tab By Mouth Daily For Cholesterol. 8)  Lorazepam 0.5 Mg Tabs (Lorazepam) .... Take 1 Every 8 Hrs  As Needed 9)  Ibuprofen 400 Mg Tabs (Ibuprofen) .... As Needed (Alternates With Mobic) 10)  Macu Cure Vit .Marland Kitchen.. 1 By Mouth Once Daily 11)  Fish Oil 1200 Mg Caps (Omega-3 Fatty Acids) .Marland Kitchen.. 1 By Mouth Once Daily  Allergies: 1)  ! Celebrex 2)  ! Vytorin 3)  ! * Achromycin 4)  ! Gaye Pollack  Past History:  Past Medical History: 1. OSTEOARTHRITIS (ICD-715.90) 2. HYPERTENSION, ESSENTIAL NOS (ICD-401.9) 3. OSTEOPOROSIS (ICD-733.00) 4. HYPERLIPIDEMIA (ICD-272.2)  : NMR Lipoprofile 2005: LDL [id number]), HDL 74, TG 118.                                  5.  5.METABOLIC SYNDROME X (ICD-277.7) 6. FIBROMYALGIA (ICD-729.1), PMH of 7. HYPOTHYROIDISM  (ICD-244.9) 8. SKIN CANCER, PMH OF (ICD-V10.83) , MYCOSIS FUNGOIDES, [facility] & Dr Nicholas Lose 9. Diverticulosis, colon 10. Vitamin D deficiency 11. Mild CKD, creatinine 1.2 (05/2009) 12. Colonic polyps, PMH of, Dr Juanda Chance 13. Anemia-NOS, PMH of 14. Myoview in 2003 showing EF 78% ; no evidence for ischemia or infarction.   15. Apical hypertrophic cardiomyopathy: Widespread T wave inversions on ECG.  Echo (3/11) showed EF 55-60% with LV asymmetric apical hypertrophy confirmed by echo contrast, moderate diastolic dysfunction, mild mitral regurgitation, mild left atrial enlargement, PA systolic pressure 42 mmHg.   Past Surgical History: Cyst removed Rt Breast, benign Cyst on Lt shoulder; G3 P2 Mis 1 Colonoscopy-2002,2009 : Tics, Dr Juanda Chance (due 2014) D&C -40  Family History: Father: negative Mother: DM Siblings: none MGM:  HTN, CVA Maternal: uncle:  MI in 32s No premature CAD in 1st degree relatives  Social History: Low carb diet Retired daughter also lives in [place] Quit tobacco  1962, < 1/4 ppd WidowSmoking Status:  quit > 6 months Packs/Day:  1/4 @ max Caffeine use/day:  1-2 cups/ day Dental Care w/in 6 mos.:  yes Sun Exposure-Excessive:  no Seat Belt Use:  yes Blood Transfusions:  no  Review of Systems  The patient denies anorexia, fever, weight loss, weight gain, hoarseness, prolonged cough, hemoptysis, melena, hematochezia, severe indigestion/heartburn, hematuria, suspicious skin lesions, unusual weight change, abnormal bleeding, enlarged lymph nodes, and angioedema.    Physical Exam  General:  appears younger than age,in no acute distress; alert,appropriate and cooperative throughout examination Head:  Normocephalic and atraumatic without obvious abnormalities.  Eyes:  No corneal or conjunctival inflammation noted. Perrla. Funduscopic exam benign, without hemorrhages, exudates or papilledema.  Ears:  External ear exam shows no significant lesions or deformities.  Otoscopic  examination reveals  wax bilaterally Nose:  External nasal examination shows no deformity or inflammation. Nasal mucosa are pink and moist without lesions or exudates. Mouth:  Oral mucosa and oropharynx without lesions or exudates.  Teeth in good repair. Neck:  No deformities, masses, or tenderness noted. Breasts:  mammograms 12/11 Lungs:  Normal respiratory effort, chest expands symmetrically. Lungs are clear to auscultation, no crackles or wheezes. Heart:  regular rhythm, no gallop, no rub, no JVD, no HJR, bradycardia, and grade 1 /6 systolic murmur. l carotid bruit  Abdomen:  Bowel sounds positive,abdomen soft and non-tender without masses, organomegaly or hernias noted. Genitalia:  Gyn exam due Msk:  Increased lordosis Pulses:  R and L carotid,radial,dorsalis pedis and posterior tibial pulses are full and equal bilaterally Extremities:  No clubbing, cyanosis, edema, or deformity noted with normal full range of motion of all joints.   Neurologic:  alert & oriented X3 and  DTRs symmetrical and normal.   Skin:  Intact without suspicious lesions or rashes Cervical Nodes:  No lymphadenopathy noted Axillary Nodes:  No palpable lymphadenopathy Psych:  memory intact for recent and remote, normally interactive, good eye contact, not anxious appearing, and not depressed appearing.     Impression & Recommendations:  Problem # 1:  PREVENTIVE HEALTH CARE (ICD-V70.0)  Orders: Medicare -1st Annual Wellness Visit [phone])  Problem # 2:  HYPERLIPIDEMIA-MIXED (ICD-272.4)  Her updated medication list for this problem includes:    Crestor 5 Mg Tabs (Rosuvastatin calcium) .Marland Kitchen... Take 1 tab by mouth daily for cholesterol.  Problem # 3:  HYPERLIPIDEMIA (ICD-272.2)  Her updated medication list for this problem includes:    Crestor 5 Mg Tabs (Rosuvastatin calcium) .Marland Kitchen... Take 1 tab by mouth daily for cholesterol.  Orders: Venipuncture (60454) TLB-Lipid Panel (80061-LIPID) TLB-Hepatic/Liver Function Pnl  (80076-HEPATIC) Specimen Handling (09811)  Problem # 4:  OSTEOPOROSIS (ICD-733.00)  Orders: Venipuncture (91478) T-Vitamin D (25-Hydroxy) (29562-13086) Specimen Handling (57846)  Problem # 5:  HYPERTROPHIC CARDIOMYOPATHY (ICD-425.1) as per Dr Myrtis Ser; chronic NS ST -T changes  Problem # 6:  HYPOTHYROIDISM (ICD-244.9)  Orders: Venipuncture (96295) TLB-TSH (Thyroid Stimulating Hormone) (84443-TSH)  Problem # 7:  COLONIC POLYPS, HX OF (ICD-V12.72)  due 2014  Orders: TLB-CBC Platelet - w/Differential (85025-CBCD) Specimen Handling (28413)  Problem # 8:  ABNORMAL ELECTROCARDIOGRAM (ICD-794.31) stable  Complete Medication List: 1)  Levothyroxine Sodium 75 Mcg  .Marland Kitchen.. 1 daily except 1/2 tues & sat 2)  Mobic 7.5 Mg Tabs (Meloxicam) .Marland Kitchen.. 1 tab once daily to two times a day prn 3)  Furosemide 40 Mg Tabs (Furosemide) .Marland Kitchen.. 1 by mouth once daily prn 4)  Lisinopril 20 Mg Tabs (Lisinopril) .... Take 1 tablet by mouth daily 5)  Baby Aspirin 81 Mg Chew (Aspirin) .... Take 1 tablet by mouth daily as directed 6)  Vitamin D3 2000 Unit Caps (Cholecalciferol) .Marland Kitchen.. 1 by mouth once daily 7)  Crestor 5 Mg Tabs (Rosuvastatin calcium) .... Take 1 tab by mouth daily for cholesterol. 8)  Lorazepam 0.5 Mg Tabs (Lorazepam) .... Take 1 every 8 hrs as needed 9)  Ibuprofen 400 Mg Tabs (Ibuprofen) .... As needed (alternates with mobic) 10)  Macu Cure Vit  .Marland KitchenMarland Kitchen. 1 by mouth once daily 11)  Fish Oil 1200 Mg Caps (Omega-3 fatty acids) .Marland Kitchen.. 1 by mouth once daily 12)  Tramadol Hcl 50 Mg Tabs (Tramadol hcl) .Marland Kitchen.. 1 every 8 hrs as needed for pain  Other Orders: TLB-BMP (Basic Metabolic Panel-BMET) (80048-METABOL) EKG w/ Interpretation (93000) Tdap => 9yrs IM (24401) Admin 1st Vaccine (02725)  Patient Instructions: 1)  Consider Bone Density & Gyn appt. 2)  Schedule a colonoscopy  to help detect colon cancer as per Dr Juanda Chance (? due 2014). 3)  Check your Blood Pressure regularly. If it is above:135/85 ON AVERAGE   you should make an appointment. 4)  Take calcium  600 mg twice a day & Vitamin D daily (Note : on 2000 IU once daily  @ present ) Prescriptions: TRAMADOL HCL 50 MG TABS (TRAMADOL HCL) 1 every 8 hrs as needed for pain  #30 x 2   Entered and Authorized by:   Marga Melnick MD   Signed by:   Marga Melnick MD on 06/27/2010   Method used:   Electronically to        [facility] (retail)       [address]       [place]  [place], [place]  [address]       Ph: [phone]       Fax: [phone]   RxID:   [id number] LORAZEPAM 0.5 MG TABS (LORAZEPAM) take 1 every 8 hrs as needed  #60 x 1   Entered and Authorized by:   Marga Melnick MD   Signed by:   Marga Melnick MD on 06/27/2010   Method used:   Printed then faxed to ...       [facility] (retail)       [address]       [place], [place]  [address]       Ph: [phone]       Fax: [phone]   RxID:   [id number] LISINOPRIL 20 MG  TABS (LISINOPRIL) take 1 tablet by mouth daily  #90 x 3   Entered and Authorized by:   Marga Melnick MD   Signed by:   Marga Melnick MD on 06/27/2010   Method used:   Printed then faxed to ...       [facility] (retail)       [address]       [place], [place]  [address]       Ph: [phone]       Fax: [phone]   RxID:   [id number] FUROSEMIDE 40 MG  TABS (FUROSEMIDE) 1 by mouth once daily prn  #90 x 3   Entered and Authorized by:   Marga Melnick MD   Signed by:   Marga Melnick MD on 06/27/2010   Method used:   Printed then faxed to ...       [facility] (retail)       [address]       [place], [place]  [address]       Ph: [phone]       Fax: [phone]   RxID:   [id number] LEVOTHYROXINE SODIUM 75 MCG 1 daily EXCEPT 1/2 Tues & Sat  #90 x 3   Entered and Authorized by:   Marga Melnick MD   Signed by:   Marga Melnick MD on  06/27/2010   Method used:   Printed then faxed to ...       [facility] (retail)       [address]       [place], [place]  [address]       Ph: [phone]       Fax: [phone]   RxID:   [id number]    Orders Added: 1)  Medicare -1st Annual Wellness Visit [G0438] 2)  Est. Patient Level III [19509] 3)  Venipuncture [32671] 4)  TLB-Lipid Panel [80061-LIPID] 5)  TLB-BMP (Basic Metabolic Panel-BMET) [80048-METABOL] 6)  TLB-CBC Platelet - w/Differential [85025-CBCD] 7)  TLB-Hepatic/Liver Function Pnl [80076-HEPATIC] 8)  TLB-TSH (Thyroid Stimulating Hormone) [84443-TSH] 9)  T-Vitamin D (25-Hydroxy) [24580-99833] 10)  EKG w/ Interpretation [93000] 11)  Tdap => 41yrs IM [90715] 12)  Admin 1st Vaccine [90471] 13)  Specimen Handling [99000]   Immunization History:  Pneumovax Immunization History:    Pneumovax:  historical (06/05/2007)  Immunizations Administered:  Tetanus Vaccine:    Vaccine Type: Tdap    Site: right deltoid    Mfr: GlaxoSmithKline    Dose: 0.5 ml    Route: IM  Given by: Shonna Chock CMA    Exp. Date: 03/23/2012    Lot #: [id number]   Immunization History:  Pneumovax Immunization History:    Pneumovax:  Historical (06/05/2007)  Immunizations Administered:  Tetanus Vaccine:    Vaccine Type: Tdap    Site: right deltoid    Mfr: GlaxoSmithKline    Dose: 0.5 ml    Route: IM    Given by: Shonna Chock CMA    Exp. Date: 03/23/2012    Lot #: [id number]     Laboratory Results

## 2010-08-24 ENCOUNTER — Other Ambulatory Visit: Payer: Self-pay | Admitting: Dermatology

## 2010-09-07 ENCOUNTER — Other Ambulatory Visit: Payer: Self-pay | Admitting: Dermatology

## 2010-10-17 NOTE — Assessment & Plan Note (Signed)
[facility]                               LIPID CLINIC NOTE   NAME:[name], [name]                     MRN:          [id number]  DATE:07/17/2007                            DOB:          Jul 31, 1927    The patient is seen in Lipid Clinic for further evaluation and  medication titration associated with her hyperlipidemia in the setting  of hypothyroidism, hypertension and high risk features. [name] states  that she is feeling well and doing well overall.  She has worked to  decrease her starches and carbohydrate and the simple sugars.  For the  most part, since her last visit, she has not been able to exercise due  to the bad weather and due to her other work activities.  She has been  evaluated by gastroenterology since we last saw her and is under  evaluation for some possible gastroesophageal reflux. At our last visit  the patient was increased to Lipitor 40 mg daily and she is back today  for followup associated with lipid and liver panels drawn.  Her past  medical history is pertinent for as noted above.   CURRENT MEDICATIONS:  1. Lipitor 40 mg daily.  2. Vitamin D three daily.  3. Furosemide 40 mg daily.  4. Calcium with D daily.  5. Multivitamin daily.  6. Aspirin 81 mg daily.  7. Meloxicam 7.5 mg daily.  8. Lisinopril 20 mg daily.  9. Synthroid 75 mcg daily.   DRUG ALLERGIES:  VYTORIN AND PENICILLIN.   LABORATORY DATA:  Labs obtained on July 10, 2007 reveal direct LDL  cholesterol of 134.  Liver function tests were within normal limits.  The total cholesterol is 217, triglyceride 222, HDL of 37.7.   PHYSICAL EXAMINATION:  Weight today in the office is 170 pounds, blood  pressure is 120/78, heart rate is 76.   ASSESSMENT:  The patient does not meet primary goal for lipid lowering  therapy at this time.  Have discussed addition of Zetia which is not a  good idea that the patient is willing to tolerate at this time due to  the  history of not tolerating Vytorin. Will plan:  1. Increase Lipitor to 80. I have discussed with her very frankly that      if her diet and exercise do not improve we will not be able to use      single therapy and will have to follow up at a later time with      addition of more potent statins or combination therapy to reduce      her LDL. She does seem motivated and willing to work to improve her      panel based on our discussion today. Time spent with the patient is      25 minutes.  She will follow up in 2-3 months with labs      and call sooner with issues in the meantime time.  Her prescription      was sent to [facility] per her request.      [name], PharmD, BCPS,  CPP  Electronically Signed      Amber Rotunda, MD, [facility]  Electronically Signed   MP/MedQ  DD: 07/29/2007  DT: 07/29/2007  Job #: [id number]   cc:   Amber C. Wall, MD, [facility]

## 2010-10-17 NOTE — Assessment & Plan Note (Signed)
[facility]                               LIPID CLINIC NOTE   NAME:[name], [name]                     MRN:          [id number]  DATE:10/31/2006                            DOB:          May 03, 1928    PAST MEDICAL HISTORY:  1. Hyperlipidemia.  2. Hypothyroidism.  3. Hypertension.   CURRENT MEDICATIONS:  1. Synthroid 75 mg daily.  2. Lisinopril 20 mg daily.  3. Meloxicam 7.5 mg daily.  4. Lipitor 20 mg daily.  5. Aspirin 81 mg daily.  6. Multivitamin daily.  7. Calcium plus D daily.   VITAL SIGNS:  Weight 171 pounds, blood pressure 124/70, heart rate 80.   LABORATORY DATA:  Total cholesterol 170, triglycerides 173, HDL 46, LDL  89.  LFTs within normal limits.   ASSESSMENT:  [name] is a very pleasant woman who returns to the Lipid  Clinic today for cholesterol management.  She has no complaints, no  shortness of breath, no muscle aches or pains.  She is compliant with  current medication regimen.  She had been doing great at her last visit,  however, over the last several months she has decreased her exercise or  completely stopped her exercise.  She has a multitude of excuses of why  she has done that and why she has not restarted.  I have encouraged her  to restart today by walking 15 minutes every day around her  neighborhood.  I have encouraged her to begin slowly and we will  increase this as she tolerates.  She had previously been eating low-fat  and low-carbohydrate meals, however she has increased her portions and  also increased the amount of carbohydrates in her diet; it is reflected  in her increasing triglycerides.  She says that she knows everything  that she needs to do and how to be healthy and make her lifestyle  modifications, she just says it is hard to change bad habits.  She makes  a good effort for a couple months and then falls back to her old habits  again.  I feel that given her 6 month followup is not  advantageous  because she needs someone to continue to encourage lifestyle  modification.  Total cholesterol is a goal of less than 200,  triglycerides greater than goal of less than 150, HDL at goal greater  than 40, and LDL is at goal of less than 100 however significantly  increased from last visit, non HDL is at goal of less than 130.   PLAN:  1. To continue current medication regimen although I have encouraged      her or warned her that we will have to increase her Lipitor if she      does not decrease her LDL.  2. Restart exercise regimen of walking 15 minutes daily around      neighborhood.  3. Decreased portion size and carbohydrates in her diet.  4. Followup visit in 3 months for lipid panel and LFTs, and make      adjustments that will be needed at that time.  Leota Sauers, PharmD  Electronically Signed      Jesse Sans. Daleen Squibb, MD, [facility]  Electronically Signed   LC/MedQ  DD: 10/31/2006  DT: 10/31/2006  Job #: [id number]

## 2010-10-17 NOTE — Assessment & Plan Note (Signed)
[facility]                               LIPID CLINIC NOTE   NAME:[name], [name]                     MRN:          [id number]  DATE:08/28/2007                            DOB:          11/23/1927    [name] comes in today for followup of her hyperlipidemia therapy  which includes Lipitor 80 mg daily.  She has been compliant with this  therapy and is tolerating it with no new muscle aches and pains to  report.  Her other medications have not changed, and they include  Synthroid, lisinopril, Mobic, aspirin 81 mg, multivitamin, calcium plus  D, Lasix and a vitamin D tablet.   PHYSICAL EXAMINATION:  Includes a weight of 168 pounds.  Blood pressure  is 130/55.  Heart rate is 64.   LABORATORY DATA:  Total cholesterol 119, triglycerides 130, HDL 40.9,  LDL 52.  Liver function tests are within normal limits.   ASSESSMENT:  [name] has been trying to follow a heart healthy diet,  but admits to some indiscretions.  She is limited in the amount of  exercise she can do due to her fibromyalgia, which is mainly foot and  leg pains.  Her cholesterol panel is improved.  Triglycerides, HDL and  LDL are all at goal.  [name] was recently increased from Lipitor 40  mg to 80 mg.  She has a lot of concerns about the potential toxicities  of medications in general, but especially what she hears in the media  about statin.  I assured her we are watching her liver function tests  carefully, and we will continue to ask her about new muscle aches and  pains, and have her call us with any questions, concerns or new  symptoms.   PLAN:  We are going to continue the Lipitor 80 mg daily.  I asked her to  try to improve her diet as much as she could and to exercise as much as  tolerated.  We are going to follow up with her with a repeat liver and  lipid panel in four months, and make any changes at that time that are  necessary.      [name], [name]  Electronically Signed      Amber Rotunda, MD, [facility]  Electronically Signed   TP/MedQ  DD: 08/28/2007  DT: 08/28/2007  Job #: [id number]

## 2010-10-17 NOTE — Assessment & Plan Note (Signed)
[facility]                               LIPID CLINIC NOTE   NAME:[name], [name]                     MRN:          [id number]  DATE:01/23/2007                            DOB:          22-Feb-1928    PAST MEDICAL HISTORY:  1. Hyperlipidemia.  2. Hypothyroidism.  3. Hypertension.   CURRENT MEDICATIONS:  1. Synthroid 75 mcg daily.  2. Lisinopril 20 mg daily.  3. Meloxicam 7.5 mg daily.  4. Lipitor 20 mg daily.  5. Aspirin 81 mg daily.  6. Multivitamin daily.  7. Calcium plus D daily.  8. Furosemide 20 mg daily.   VITAL SIGNS:  Weight 172 pounds, blood pressure 130/66, heart rate 60.   LAB DATA:  Total cholesterol 152, triglycerides 148, HDL 40, LDL 82,  LFTs within normal limits.   ASSESSMENT:  [name] is a pleasant elderly woman who returns to lipid  clinic today with no chest pain, no shortness of breath.  She does  complain of some muscle pains in the evening time about a month ago for  about 2-3 days.  She was having some pains across her shoulders and her  arms that felt kind of like an achy muscle pain.  She also had some  muscle pain or weakness in her upper legs.  This did resolve and has not  returned.  I doubt that it is anything related to her lipid therapy;  however, she would like a total CK done at this visit.  Her total  cholesterol is at goal of less than 200, triglycerides at goal of less  than 150.  HDL. although decreased since last visit remains above goal  of greater than 40, and her LDL remains less than goal of 100.  She  seems to be following her low fat, low carbohydrate diet.  She says she  is eating less bread recently and has increased the amount of her  vegetables; however, she remains to be noncompliant with her exercise.  She walks approximately 30 minutes 2-3 times a week whenever she kind of  feels like it.  She is not on any consistent exercise regimen.  She said  there are no sidewalks around her house  for her to walk, and it is a  chore for her to drive to the nearest park which would be [facility]  and does not like to go to [employer] to walk.  I have encouraged  her to add this to her daily routine to take 15-20 minutes whenever she  is planning on going to a grocery store or out and about to stop by the  [facility] either before or after errands for a 30 minute walk.  She  does seem to be agreeable to this, and I will continue to encourage  lifestyle modification with her in the future.   PLAN:  1. Continue current medication regimen.  2. Restart exercise regimen.  3. Continue to decrease carbohydrates and fats in diet and increase      fruits and vegetables.  4. Followup visit in 3  months for lipid panel and LFTs, and we will      make adjustments that will be needed at that time.      Leota Sauers, PharmD  Electronically Signed      Luis Abed, MD, [facility]  Electronically Signed   LC/MedQ  DD: 01/23/2007  DT: 01/24/2007  Job #: [id number]

## 2010-10-17 NOTE — Assessment & Plan Note (Signed)
[facility]                         GASTROENTEROLOGY OFFICE NOTE   NAME:Conkright, Pedro [place] PAONE                     MRN:          [id number]  DATE:05/16/2007                            DOB:          Jan 31, 1928    PROBLEMS:  1. Hemoccult positive stool.  2. Anemia.   HISTORY:  Jennifer is a pleasant, 77 year old, white female known  remotely to Dr. Lina Sar from colonoscopy done in 2002.  This was  done for evaluation of anemia and low iron saturation.  She was found to  have severe diverticular disease with narrowing of the lumen in the  sigmoid segment, required pediatric scope to pass.  The exam was  otherwise negative.  She was to have followup in 10 years.  The patient  says that she did hemoccult cards as part of her recent physical and was  found to be hemoccult positive.  Routine labs also show a hemoglobin of  11.1, hematocrit of 32.4, MCV of 81.5.  BUN was 28, creatinine 1.4.  The  patient denies any current GI problems.  She has not had any recent  difficulty with heartburn, indigestion, dysphagia, etcetera.  Her  appetite has been fine.  Her weight has been stable.  She has no  complaints of abdominal pain.  No changes in her bowel habits.  No  melena or hematochezia.  She does take one aspirin daily and has been  using Mobic for arthritic symptoms, generally no more than 3 times per  week.   CURRENT MEDICATIONS:  1. Synthroid 75 mcg daily.  2. Lisinopril 20 daily.  3. Mobic 7.5 perhaps every other day.  4. Lipitor 40 mg daily.  5. Aspirin 81 mg daily.  6. Multivitamin daily.  7. Calcium plus D daily.  8. Lasix 40 mg daily.  9. Vitamin D supplement daily.   ALLERGIES:  1. VYTORIN.  2. PENICILLIN.   FAMILY HISTORY:  Negative for colon cancer or polyps.   PAST HISTORY:  Pertinent for:  1. 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  2. She has not had any abdominal surgery.  3. She does have hypertension.  4.  Hyperlipidemia.  5. Arthritis.  6. Possible fibromyalgia.  7. Hypothyroidism.   SOCIAL HISTORY:  The patient is married and lives with her spouse.  She  has 2 children.  She is a nonsmoker, nondrinker.   REVIEW OF SYSTEMS:  Positive for arthritic symptoms, otherwise  completely negative review of systems.   PHYSICAL EXAMINATION:  GENERAL:  Well developed, elderly, white female,  pleasant in no acute distress.  VITAL SIGNS:  Height is 5 feet, weight is 173.8, blood pressure 150/60,  pulse is 72.  HEENT:  Nontraumatic, normocephalic.  EOMI.  PERRLA.  Sclerae are  anicteric.  NECK:  Supple.  CARDIOVASCULAR:  Regular rate and rhythm with S1 and S2.  No murmur,  rub, or gallop.  PULMONARY:  Clear to A&P.  ABDOMEN:  Soft, nontender.  Bowel sounds are active.  There is no  palpable mass or hepatosplenomegaly.  RECTAL:  Not done today.  Recent hemoccult positive.   IMPRESSION:  A 77 year old white female with a normocytic anemia,  hemoccult positive stool rule out aspirin/NSAID-induced gastropathy,  rule out occult lesion with previous history of an iron-deficiency  anemia dating several years back, consider arteriovenous malformations  as another possibility.   PLAN:  1. Check iron studies today.  2. Discontinue Mobic.  3. Trial of Protonix 40 mg q.a.m.  4. Schedule an upper endoscopy.  5. Schedule colonoscopy with a pediatric scope.  These procedures will      be scheduled with Dr. Juanda Chance.      Mike Gip, PA-C  Electronically Signed      Iva Boop, MD,FACG  Electronically Signed   AE/MedQ  DD: 05/16/2007  DT: 05/18/2007  Job #: [id number]   cc:   Titus Dubin. [name], MD,FACP,FCCP  Hedwig Morton. Juanda Chance, MD

## 2010-10-17 NOTE — Assessment & Plan Note (Signed)
[facility]                               LIPID CLINIC NOTE   NAME:LEWISTaylinn, [name]                     MRN:          [id number]  DATE:12/25/2007                            DOB:          Oct 31, 1927    Return office visit for Lipid Clinic.   PAST MEDICAL HISTORY:  1. Hyperlipidemia.  2. Degenerative joint disease.  3. Hypothyroidism.  4. Hypertension.   MEDICATIONS:  1. Synthroid 75 mcg daily.  2. Lisinopril 20 mg daily.  3. Meloxicam 7.5 mg daily.  4. Lipitor 80 mg daily.  5. Aspirin 81 mg daily.  6. Multivitamin daily.  7. Calcium with vitamin D daily.  8. Furosemide 40 mg daily.  9. Vitamin D 2000 mg daily.   PHYSICAL EXAMINATION:  VITAL SIGNS:  Weight 166 pounds, blood pressure  150/60, heart rate was 80.   LABORATORY DATA:  Total cholesterol 128, triglycerides 114, HDL 43, and  LDL 62.  LFTs within normal limits.   ASSESSMENT:  [name] is a very pleasant elderly woman who returns to  Lipid Clinic today with no chest pain, no shortness, no muscle aches or  pains.  She is compliant with current medication regimen.  She tries to  be compliant with a low-fat and low-carbohydrate diet.  She occasionally  snacks on sweets and thinks that she knows that she should not have it,  but sometimes she states that she needs to self-indulge on sweets that  she misses.  She follows a fairly low-fat, low-carbohydrate diet on a  daily basis.  Her biggest vice is that she knows that she should  exercise, but she does not like to, nor does she want to do it.  She  occasionally will walk about 10-20 minutes around her block, but does  not do this on a consistent basis.  We had a lengthy conversation  regarding the benefit of exercise and she does seem somewhat willing to  begin a small exercise regimen of lifting some arm weights at home that  she has and also doing 3 days a week of a 20-minute walk.  When she is  on some type of exercise schedule, I  will encourage an increase in the  time that she is walking.  Currently, her total cholesterol is at goal  of less than 200, triglycerides at goal of less than 150, HDL at goal of  greater than 40, and LDL at goal of less than 70.   PLAN:  1. Continue current Lipitor 80 mg daily.  She is not having any      problems with this dose.  2. Continue low-fat, low-carbohydrate diet.  3. Increase exercise.  4. Followup visit, a lipid panel, and LFTs in 6 months and make      adjustments at that time.      Leota Sauers, PharmD  Electronically Signed      Jesse Sans. Daleen Squibb, MD, [facility]  Electronically Signed   LC/MedQ  DD: 12/25/2007  DT: 12/26/2007  Job #: [id number]

## 2010-10-17 NOTE — Assessment & Plan Note (Signed)
[facility]                               LIPID CLINIC NOTE   NAME:[name], [name]                     MRN:          [id number]  DATE:04/17/2007                            DOB:          Nov 02, 1927    CHIEF COMPLAINT:  Dyslipidemia.   PAST MEDICAL HISTORY:  1. Hyperlipidemia.  2. Hypothyroidism.  3. Hypertension.   CURRENT MEDICATIONS:  1. Synthroid 75 mcg daily.  2. Lisinopril 20 mg daily.  3. Meloxicam 7.5 mg daily.  4. Lipitor 20 mg daily.  5. Aspirin 81 mg daily.  6. Multivitamin once daily.  7. Calcium once daily.  8. Furosemide 40 mg daily.   CURRENT LABORATORY:  Total cholesterol 168, triglycerides 167, HDL 41.9,  LDL 93.  LFTs within normal limits.  CK is 66.   VITAL SIGNS:  Weight is 172 pounds, blood pressure is 132/60, heart rate  is 64.   ASSESSMENT:  Ms. Moening comes in today with assessing her diet today.  She states that she tries to lay off her sweets and she eats lots of  vegetables but she does occasionally eat cornbread and some ice cream  and more deserts than she thinks she should.  She also eats a lot of  pasta, so I encouraged her to decrease the amount of pasta and be more  conscious on her deserts, also to use a whole grain pasta which she was  willing to try.  As far as her exercise, she unfortunately is having difficulty meeting  our goals for her exercise.  I encouraged her to try a little bit more  at least 30 minutes 4-5 times a week, but she does have some  fibromyalgia which seems to be impairing her abilities.  She did seem to  express some interest in possibly joining a gym or a facility to  increase her exercise abilities and she will be pursuing this at  followup and we will assess this.  Today, her cholesterol panel has changed slightly since last time.  Her  LDL has increased slightly from 82 to 93, and her total cholesterol has  gone up as well, and her triglycerides.  As a result, today we  decided  to increase her Lipitor to 40 mg daily.  As stated before, we assessed  diet changes and exercise changes and we will follow up in 3 months with  a lipid panel and LFTs panel.  Also because at last visit she was having some muscle pain, we drew a CK  level which was normal and explained to her what this meant.      Leota Sauers, PharmD  Electronically Signed      Jesse Sans. Daleen Squibb, MD, [facility]  Electronically Signed   LC/MedQ  DD: 04/17/2007  DT: 04/17/2007  Job #: [id number]

## 2010-10-20 NOTE — Assessment & Plan Note (Signed)
[facility]                              CARDIOLOGY OFFICE NOTE   NAME:[name], [name]                     MRN:          [id number]  DATE:12/13/2005                            DOB:          [birth year]    PAST MEDICAL HISTORY:  1.  Hyperlipidemia.  2.  Hypothyroidism.  3.  Hypertension.   MEDICATIONS:  1.  Synthroid 0.75 mg daily.  2.  Furosemide 40 mg daily.  3.  Mobic 7.5 mg daily.  4.  Multivitamin daily.  5.  Aspirin 81 mg daily.  6.  Lipitor 20 mg daily.   VITAL SIGNS:  Weight 178 pounds, blood pressure 150/70, heart rate 76.   LABORATORY DATA:  Total cholesterol 126, triglycerides 97, HDL 40, LDL 67.  LFTs within normal limits.   ASSESSMENT:  [name] is a very pleasant woman who returns to Lipid Clinic  today with no chest pain, no shortness of breath, no muscle aches or pains.  Her only complaint is increased swelling in her feet. She is on Furosemide  40 mg once daily.  Occasionally she takes another tablet in the afternoon if  her feet are very swollen.  I have discussed with her decreasing the amount  of sodium in her diet.  I am limiting her to a 2 g sodium diet.  Talked to  her about reading labels which she needs to look at regarding sodium in her  diet as well as fat and cholesterol in her diet.  She does exercise by  walking two to three times per week for about 30 minutes at a time.  She  also does arm weights while seated at home.  I have encouraged her to  increase her exercise to three to four days a week, make that a consistent  schedule for 30 minutes at a time and continue her arm weights.  Her total  cholesterol has a goal of less than 200, triglycerides at goal of less than  150, HDL at goal of 40 or greater, LDL at goal of less than 70.  My only  concern is that she is having the swelling in her feet and her blood  pressure is elevated this visit.  In the past, she had been on Lisinopril  20, however, she says it  was not refilled at her one-year check-up by her  primary physician last year.  I have encouraged her on her next visit, to  ask him about this and see if there was a misunderstanding that she needs to  continue that.   PLAN:  1.  Continue current medication therapy, Lipitor 20 mg daily.  2.  Continue heart healthy diet while decreasing fats and sodium.  3.  Increase exercise regimen.  4.  Follow-up visit in six months for lipid panel and LFTs and will make any      medications that will be warranted at that time.  [name], PharmD    LC/MedQ  DD:  12/17/2005  DT:  12/17/2005  Job #:  [id number]

## 2010-10-20 NOTE — Assessment & Plan Note (Signed)
[facility]                               LIPID CLINIC NOTE   NAME:[name], [name]                     MRN:          [id number]  DATE:05/16/2006                            DOB:          [birth year]    PAST MEDICAL HISTORY:  Significant for hyperlipidemia, hypothyroidism,  and hypertension.   CURRENT MEDICATIONS:  Include Synthroid 0.75 mg daily, Furosemide 40 mg  daily, Mobic 7.5 mg daily, multivitamin once daily, aspirin 81 mg daily,  Lipitor 20 mg daily, and lisinopril 20 mg daily.   VITAL SIGNS:  Weight 173 pounds, blood pressure 126/56, and a heart rate  of 68.   Most recent labs from May 10, 2006.  Total cholesterol 140,  triglycerides of 110, HDL 47.9, LDL of 70.  Her liver function tests are  within normal limits.   PATIENT ASSESSMENT:  [name] is a 77 year old female who returns to  the lipid clinic for cholesterol management.  She has no complaints of  shortness of breath, chest pain, or muscle aches.  The patient states  that she has decreased her carbohydrate intake and decreased portion  sizes since her last visit.  She is eating soups with a half sandwich  for lunch, and eats pickles almost daily.  I have instructed her to  limit her salt intake by decreasing the number of pickles, or diluting  her canned soups.  She has agreed to cook less fried meat and to  continue to watch her portion sizes.  In terms of exercises, she  exercises 10-15 minutes around the block twice weekly by walking.  I  have encouraged her to increase the length of her walks to about 25  minutes as tolerated, and to increase her walking to 3 to 4 times a  week.  Furthermore, I have advised her to begin her arm exercises daily,  as she has not been doing this as directed previously.  The patient is  currently at goal of a total cholesterol less than 200, triglycerides  less than 150, HDL greater than 40.  Her LDL is currently at 70 and all  of her liver  function tests are within normal limits.  In comparison to  her labs from 6 months ago, [name] has an increase in her  triglycerides from 97 to 110, and an increase in her LDL from 67 to 70.  Her HDL has improved from 39.7 to 47.9 since her last visit.   PLAN:  1. We plan to continue her Lipitor 20 mg once daily.  [name] did      enquire about generic alternatives.  I suggested simvastatin 40 mg.      However, due to a previous nausea, vomiting, and diarrhea with      Vytorin, the patient would like to continue her Lipitor at this      time, and she may reconsider simvastatin at a future visit.  2. We emphasized heart healthy diet, decrease her fried foods, and to      limit her salt intake.  3. Increase her exercise regimen by walking  for at least 25 minutes as      directed, and encouraging arm exercises.  4. Followup visit will be scheduled in 6 months for a lipid panel and      LFTs.  We will make any medication changes warranted at that time.      Leota Sauers, PharmD  Electronically Signed      Jesse Sans. Daleen Squibb, MD, [facility]  Electronically Signed   LC/MedQ  DD: 05/16/2006  DT: 05/16/2006  Job #: [id number]

## 2010-12-11 ENCOUNTER — Other Ambulatory Visit: Payer: Self-pay | Admitting: Internal Medicine

## 2010-12-12 MED ORDER — LORAZEPAM 0.5 MG PO TABS: 0.5000 mg | ORAL_TABLET | Freq: Three times a day (TID) | ORAL | Status: DC | PRN

## 2010-12-12 NOTE — Telephone Encounter (Signed)
Faxed to pharmacy

## 2010-12-13 ENCOUNTER — Other Ambulatory Visit: Payer: Self-pay | Admitting: Internal Medicine

## 2010-12-13 ENCOUNTER — Other Ambulatory Visit (INDEPENDENT_AMBULATORY_CARE_PROVIDER_SITE_OTHER): Payer: Self-pay

## 2010-12-13 DIAGNOSIS — D649 Anemia, unspecified: Secondary | ICD-10-CM

## 2010-12-13 DIAGNOSIS — Z79899 Other long term (current) drug therapy: Secondary | ICD-10-CM

## 2010-12-13 DIAGNOSIS — E785 Hyperlipidemia, unspecified: Secondary | ICD-10-CM

## 2010-12-13 LAB — IRON: Iron: 61 ug/dL (ref 42–145)

## 2010-12-13 LAB — CBC WITH DIFFERENTIAL/PLATELET
Basophils Relative: 0.7 % (ref 0.0–3.0)
Eosinophils Relative: 0.2 % (ref 0.0–5.0)
Lymphocytes Relative: 34.2 % (ref 12.0–46.0)
Monocytes Relative: 8.5 % (ref 3.0–12.0)
Neutrophils Relative %: 56.4 % (ref 43.0–77.0)
RBC: 3.49 Mil/uL — ABNORMAL LOW (ref 3.87–5.11)
WBC: 4.3 10*3/uL — ABNORMAL LOW (ref 4.5–10.5)

## 2010-12-13 LAB — FOLATE: Folate: >24.8 ng/mL (ref >5.9)

## 2010-12-13 LAB — VITAMIN B12: Vitamin B-12: 370 pg/mL (ref 211–911)

## 2010-12-25 ENCOUNTER — Encounter: Payer: Self-pay | Admitting: Internal Medicine

## 2010-12-27 ENCOUNTER — Ambulatory Visit (INDEPENDENT_AMBULATORY_CARE_PROVIDER_SITE_OTHER): Payer: Medicare Other | Admitting: Internal Medicine

## 2010-12-27 ENCOUNTER — Encounter: Payer: Self-pay | Admitting: Internal Medicine

## 2010-12-27 DIAGNOSIS — E782 Mixed hyperlipidemia: Secondary | ICD-10-CM

## 2010-12-27 DIAGNOSIS — Z86010 Personal history of colonic polyps: Secondary | ICD-10-CM

## 2010-12-27 DIAGNOSIS — I10 Essential (primary) hypertension: Secondary | ICD-10-CM

## 2010-12-27 DIAGNOSIS — D649 Anemia, unspecified: Secondary | ICD-10-CM

## 2010-12-27 NOTE — Patient Instructions (Addendum)
Eat a low-fat diet with lots of fruits and vegetables, up to 7-9 servings per day. Avoid obesity; your goal is waist measurement < 35 inches.Consume less than 30 grams of sugar per day from foods & drinks with High Fructose Corn Sugar as #1,2,3 or # 4 on label. Follow the low carb nutrition program in [employer] as closely as possible to prevent Diabetes progression & complications. White carbohydrates (potatoes, rice, bread, and pasta) have a high spike of sugar and a high load of sugar. For example a  baked potato has a cup of sugar and a  french fry  2 teaspoons of sugar. Yams, wild  rice, whole grained bread &  wheat pasta have been much lower spike and load of  sugar. Portions should be the size of a deck of cards or your palm. Please  schedule fasting Labs : BMET,Lipids,A1c, TSH , CBCD( 277.7, V18.0, 401.9, 244.9, 285.9)

## 2010-12-27 NOTE — Progress Notes (Signed)
Subjective:    Patient ID: [name], female    DOB: 1927-06-11, 77 y.o.   MRN: [id number]  HPI  #1 Anemia:  Her hematocrit is 31.1. In the past 3 years this has  married from this level to a high of 36.5 in December/2010. She denies dysphasia, abdominal pain, weight loss,melena, or rectal bleeding. She states that she had an upper endoscopy and colonoscopy in 2009 by Dr. Juanda Chance. Polyps were removed; followup was recommended in 7-10 years. Her iron level and B12 level are both normal. The B12 level was 815 one year ago and is now 370.   #2 Dyslipidemia assessment: Advanced Lipid Testing: NMR Lipoprofile 12/13/10.   Family history of premature CAD/ MI: no .  Nutrition: no plan .  Exercise: no . Diabetes : no but mother had DM . HTN: controlled. Smoking history  : quit 1962 .   Weight :  Up 10#.  Lab results reviewed : Triglycerides are  Up to 228.These previously were 93-100. LDL is 139; goal would be less than 100, ideally  less than 70 based on particle distribution.    Review of SystemsROS: fatigue: no ; chest pain : no ;claudication: no; palpitations: no; abd pain/bowel changes: no ; myalgias:no;  syncope : no ; memory loss: no;skin changes: no.     Objective:   Physical Exam Gen.: well-nourished in appearance. Alert, appropriate and cooperative throughout exam. Eyes: No corneal or conjunctival inflammation noted.  Neck: No deformities, masses, or tenderness noted.  Thyroid  normal. Lungs: Normal respiratory effort; chest expands symmetrically. Lungs are clear to auscultation without rales, wheezes, or increased work of breathing. Heart: Normal rate and rhythm. Normal S1 and S2. No gallop, click, or rub. Occasional premature; Grade 1/6 systolic  murmur. Abdomen: Bowel sounds normal; abdomen soft and nontender. No masses, organomegaly or hernias noted.                                                                                  Musculoskeletal/extremities: Lordosis noted  of  the thoracic or lumbar spine. No clubbing, cyanosis, edema, or deformity noted. Joints normal. Nail health  good. Vascular: Carotid, radial artery, dorsalis pedis and  posterior tibial pulses are full and equal. No bruits present. Neurologic: Alert and oriented x3. Deep tendon reflexes symmetrical and normal.         Skin: Intact without suspicious lesions or rashes. Psych: Mood and affect are normal. Normally interactive                                                                                         Assessment & Plan:  #1 dyslipidemia; LDL is not at goal of less than 100. Major dietary contribution suggested by triglycerides of 228  #2 hypertension, controlled  #3 anemia, essentially stable. By history she's had a negative  upper endoscopy and colonoscopy in 2009. B12, folate, and iron levels are normal. There is no renal insufficiency based on recent labs.  Plan: See patient instructions for nutritional interventions. Repeat fasting labs in 4 months. If lipids are not at goal; considers pravastatin 20 mg daily.  CBC will be monitored as well.

## 2011-01-01 ENCOUNTER — Other Ambulatory Visit: Payer: Medicare Other

## 2011-01-01 DIAGNOSIS — Z1211 Encounter for screening for malignant neoplasm of colon: Secondary | ICD-10-CM

## 2011-01-01 LAB — HEMOCCULT GUIAC POC 1CARD (OFFICE)
Card #2 Fecal Occult Blod, POC: NEGATIVE
Card #3 Fecal Occult Blood, POC: NEGATIVE

## 2011-01-01 NOTE — Progress Notes (Signed)
Labs only

## 2011-01-26 ENCOUNTER — Telehealth: Payer: Self-pay | Admitting: Internal Medicine

## 2011-01-26 DIAGNOSIS — E039 Hypothyroidism, unspecified: Secondary | ICD-10-CM

## 2011-01-26 DIAGNOSIS — I10 Essential (primary) hypertension: Secondary | ICD-10-CM

## 2011-01-26 DIAGNOSIS — D649 Anemia, unspecified: Secondary | ICD-10-CM

## 2011-01-26 DIAGNOSIS — E8881 Metabolic syndrome: Secondary | ICD-10-CM

## 2011-01-26 DIAGNOSIS — E785 Hyperlipidemia, unspecified: Secondary | ICD-10-CM

## 2011-01-26 DIAGNOSIS — R7309 Other abnormal glucose: Secondary | ICD-10-CM

## 2011-01-26 DIAGNOSIS — Z833 Family history of diabetes mellitus: Secondary | ICD-10-CM

## 2011-01-26 NOTE — Telephone Encounter (Signed)
Spoke w/ pt would like to start on Pravastatin 20 mg and will call office to schedule appt to recheck labs.

## 2011-01-29 MED ORDER — PRAVASTATIN SODIUM 20 MG PO TABS: 20.0000 mg | ORAL_TABLET | Freq: Every day | ORAL | Status: DC

## 2011-01-29 NOTE — Telephone Encounter (Signed)
Addended by: Doristine Devoid on: 01/29/2011 09:41 AM   Modules accepted: Orders

## 2011-02-14 ENCOUNTER — Telehealth: Payer: Self-pay | Admitting: *Deleted

## 2011-02-14 DIAGNOSIS — T887XXA Unspecified adverse effect of drug or medicament, initial encounter: Secondary | ICD-10-CM

## 2011-02-14 DIAGNOSIS — IMO0001 Reserved for inherently not codable concepts without codable children: Secondary | ICD-10-CM

## 2011-02-14 NOTE — Telephone Encounter (Signed)
She needs a CK drawn  to assess complaints; this does not need to be fasting and can be done on 9/13.(729.1, 995.20)

## 2011-02-14 NOTE — Telephone Encounter (Signed)
Patient states that she was recently placed on Pravastatin 20 mg and is now experiencing sickness w/muscle aches and would like to know if there is an alternative medication or if she should not take cholesterol medication at this time. Informed Pt to stop Pravastatin until she hears back with response from MD.

## 2011-02-15 NOTE — Telephone Encounter (Signed)
Patient informed; scheduled at [facility] lab per patient request 02/16/11 @ 9:00am Lab order placed.

## 2011-02-16 ENCOUNTER — Other Ambulatory Visit: Payer: Self-pay | Admitting: Internal Medicine

## 2011-02-16 ENCOUNTER — Other Ambulatory Visit: Payer: Medicare Other

## 2011-02-16 ENCOUNTER — Other Ambulatory Visit (INDEPENDENT_AMBULATORY_CARE_PROVIDER_SITE_OTHER): Payer: Medicare Other

## 2011-02-16 DIAGNOSIS — I10 Essential (primary) hypertension: Secondary | ICD-10-CM

## 2011-02-16 DIAGNOSIS — E039 Hypothyroidism, unspecified: Secondary | ICD-10-CM

## 2011-02-16 DIAGNOSIS — T887XXA Unspecified adverse effect of drug or medicament, initial encounter: Secondary | ICD-10-CM

## 2011-02-16 DIAGNOSIS — R7309 Other abnormal glucose: Secondary | ICD-10-CM

## 2011-02-16 DIAGNOSIS — D649 Anemia, unspecified: Secondary | ICD-10-CM

## 2011-02-16 DIAGNOSIS — E785 Hyperlipidemia, unspecified: Secondary | ICD-10-CM

## 2011-02-16 LAB — BASIC METABOLIC PANEL
BUN: 25 mg/dL — ABNORMAL HIGH (ref 6–23)
Chloride: 109 mEq/L (ref 96–112)
GFR: 46.27 mL/min — ABNORMAL LOW (ref >60.00)
Glucose, Bld: 94 mg/dL (ref 70–99)
Potassium: 4.5 mEq/L (ref 3.5–5.1)
Sodium: 142 mEq/L (ref 135–145)

## 2011-02-16 LAB — CBC WITH DIFFERENTIAL/PLATELET
Basophils Absolute: 0 10*3/uL (ref 0.0–0.1)
HCT: 32.3 % — ABNORMAL LOW (ref 36.0–46.0)
Lymphs Abs: 1.6 10*3/uL (ref 0.7–4.0)
MCV: 90.2 fl (ref 78.0–100.0)
Monocytes Absolute: 0.4 10*3/uL (ref 0.1–1.0)
Monocytes Relative: 8.7 % (ref 3.0–12.0)
Neutrophils Relative %: 52.4 % (ref 43.0–77.0)
Platelets: 213 10*3/uL (ref 150.0–400.0)
RDW: 14.2 % (ref 11.5–14.6)
WBC: 4.3 10*3/uL — ABNORMAL LOW (ref 4.5–10.5)

## 2011-02-16 LAB — LIPID PANEL
HDL: 53.5 mg/dL (ref >39.00)
Total CHOL/HDL Ratio: 4
VLDL: 37.2 mg/dL (ref 0.0–40.0)

## 2011-02-16 LAB — CK: Total CK: 75 U/L (ref 7–177)

## 2011-02-16 LAB — TSH: TSH: 2.4 u[IU]/mL (ref 0.35–5.50)

## 2011-02-19 ENCOUNTER — Encounter: Payer: Self-pay | Admitting: Internal Medicine

## 2011-05-10 ENCOUNTER — Other Ambulatory Visit: Payer: Self-pay | Admitting: Internal Medicine

## 2011-06-14 ENCOUNTER — Telehealth: Payer: Self-pay | Admitting: Internal Medicine

## 2011-06-14 MED ORDER — LORAZEPAM 0.5 MG PO TABS: 0.5000 mg | ORAL_TABLET | Freq: Three times a day (TID) | ORAL | Status: DC | PRN

## 2011-06-14 NOTE — Telephone Encounter (Signed)
Please advise refill? 

## 2011-06-14 NOTE — Telephone Encounter (Signed)
Refill-lorazepam 0.5mg  tab wats. Last fill 7.10.12

## 2011-06-14 NOTE — Telephone Encounter (Signed)
Rx sent 

## 2011-06-25 DIAGNOSIS — M19079 Primary osteoarthritis, unspecified ankle and foot: Secondary | ICD-10-CM | POA: Diagnosis not present

## 2011-07-03 ENCOUNTER — Encounter: Payer: Self-pay | Admitting: Internal Medicine

## 2011-07-03 ENCOUNTER — Ambulatory Visit (INDEPENDENT_AMBULATORY_CARE_PROVIDER_SITE_OTHER): Payer: Medicare Other | Admitting: Internal Medicine

## 2011-07-03 VITALS — BP 120/68 | HR 70 | Temp 97.8°F | Resp 12 | Ht 58.25 in | Wt 157.2 lb

## 2011-07-03 DIAGNOSIS — Z0000 Encounter for general adult medical examination without abnormal findings: Secondary | ICD-10-CM | POA: Diagnosis not present

## 2011-07-03 DIAGNOSIS — E782 Mixed hyperlipidemia: Secondary | ICD-10-CM | POA: Diagnosis not present

## 2011-07-03 DIAGNOSIS — I10 Essential (primary) hypertension: Secondary | ICD-10-CM | POA: Diagnosis not present

## 2011-07-03 DIAGNOSIS — D649 Anemia, unspecified: Secondary | ICD-10-CM | POA: Diagnosis not present

## 2011-07-03 DIAGNOSIS — R9431 Abnormal electrocardiogram [ECG] [EKG]: Secondary | ICD-10-CM

## 2011-07-03 DIAGNOSIS — E8881 Metabolic syndrome: Secondary | ICD-10-CM | POA: Diagnosis not present

## 2011-07-03 DIAGNOSIS — E559 Vitamin D deficiency, unspecified: Secondary | ICD-10-CM

## 2011-07-03 DIAGNOSIS — E039 Hypothyroidism, unspecified: Secondary | ICD-10-CM

## 2011-07-03 NOTE — Assessment & Plan Note (Signed)
By report home blood pressure measurements indicate good control; labs will be checked.

## 2011-07-03 NOTE — Progress Notes (Signed)
Subjective:    Patient ID: [name], female    DOB: Apr 29, 1928, 78 y.o.   MRN: [id number]   HPI Medicare Wellness Visit:  The following psychosocial & medical history were reviewed as required by Medicare.   Social history: caffeine: 2 cups coffee/ day , alcohol: no ,  tobacco use : quit 1960s  & exercise : no.   Home & personal  safety / fall risk: no issues, activities of daily living: no limitations, seatbelt use : yes , and smoke alarm employment : yes.  Power of Attorney/Living Will status : yes  Vision ( as recorded per Nurse) & Hearing  evaluation : Ophth exam appt pending;acuity slightly decreased to whisper on L. Orientation :orientedx 3 , memory & recall :good, spelling testing: good,and mood & affect : normal . Depression / anxiety: denied Travel history : never out of [place] , immunization status :Shingles needed , transfusion history:  ? Post partum, and preventive health surveillance ( colonoscopies, BMD , etc as per protocol/ [facility]): colonoscopy up to date, Dental care: seen every 6 mos . Chart reviewed &  Updated. Active issues reviewed & addressed.       Review of Systems HYPERTENSION: Disease Monitoring: Blood pressure range-well controlled Chest pain, palpitations- no       Dyspnea- no Medications: Compliance- yes Lightheadedness,Syncope- no    Edema- occasional   METABOLIC SYNDROME: Disease Monitoring: Blood Sugar ranges-not checked  Polyuria/phagia/dipsia-no       Visual problems- no   HYPERLIPIDEMIA: Disease Monitoring: See symptoms for Hypertension Medications: Compliance- no  Abd pain, bowel changes-no  Muscle aches- no        Objective:   Physical Exam Gen.: Healthy and well-nourished in appearance. Alert, appropriate and cooperative throughout exam. Head: Normocephalic without obvious abnormalities Eyes: No corneal or conjunctival inflammation noted.  Extraocular motion intact. Vision grossly decreased in OS even  with lenses. Ears:  External  ear exam reveals no significant lesions or deformities. Canals;wax bilaterally. Nose: External nasal exam reveals no deformity or inflammation. Nasal mucosa are pink and moist. No lesions or exudates noted.  Mouth: Oral mucosa and oropharynx reveal no lesions or exudates. Teeth in good repair. Neck: No deformities, masses, or tenderness noted.  Thyroid normal Lungs: Normal respiratory effort; chest expands symmetrically. Lungs are clear to auscultation without rales, wheezes, or increased work of breathing. Heart: Normal rate and rhythm. Normal S1 and S2. No gallop, click, or rub. Occasional premature beat.Grade 1/6 systolic murmur  Abdomen: Bowel sounds normal; abdomen soft and nontender. No masses, organomegaly or hernias noted. Genitalia: as per Gyn  .                                                                                   Musculoskeletal/extremities: lordosis noted of  the thoracic or lumbar spine. No clubbing, cyanosis, edema, or deformity noted. Tone & strength  normal.Joints normal. Nail health  good. Vascular: Carotid, radial artery, dorsalis pedis and  posterior tibial pulses are full and equal. No bruits present. Neurologic: Alert and oriented x3. Deep tendon reflexes symmetrical and normal.          Skin: Intact without suspicious lesions or rashes.  Lymph: No cervical, axillary lymphadenopathy present. Psych: Mood and affect are normal. Normally interactive                                                                                         Assessment & Plan:  #1 Medicare Wellness Exam; criteria met ; data entered #2 Problem List reviewed ; Assessment/ Recommendations made Plan: see Orders

## 2011-07-03 NOTE — Assessment & Plan Note (Signed)
Fasting lipids 02/16/11 revealed triglycerides of 186 and LDL of 127. Fasting lipids will be rechecked.

## 2011-07-03 NOTE — Patient Instructions (Signed)
The most common cause of elevated triglycerides is the ingestion of sugar from high fructose corn syrup sources added to processed foods & drinks.  Eat a low-fat diet with lots of fruits and vegetables, up to 7-9 servings per day. Consume less than 30 (preferably ZERO)grams of sugar per day from foods & drinks with High Fructose Corn Syrup (HFCS) sugar as #1,2,3 or # 4 on label.Whole Foods, [employer] Jo[employer] & [employer] do not carry products with HFCS. Follow a  low carb nutrition program such as [place] or [employer]  to prevent Diabetes progression . White carbohydrates (potatoes, rice, bread, and pasta) have a high spike of sugar and a high load of sugar. For example a  baked potato has a cup of sugar and a  french fry  2 teaspoons of sugar. Yams, wild  rice, whole grained bread &  wheat pasta have been much lower spike and load of  sugar. Portions should be the size of a deck of cards or your palm.  Blood Pressure Goal  Ideally is an AVERAGE < 135/85. This AVERAGE should be calculated from @ least 5-7 BP readings taken @ different times of day on different days of week. You should not respond to isolated BP readings , but rather the AVERAGE for that week

## 2011-07-04 LAB — LIPID PANEL
Cholesterol: 167 mg/dL (ref 0–200)
HDL: 61.9 mg/dL (ref >39.00)
LDL Cholesterol: 83 mg/dL (ref 0–99)
Triglycerides: 109 mg/dL (ref 0.0–149.0)
VLDL: 21.8 mg/dL (ref 0.0–40.0)

## 2011-07-04 LAB — BASIC METABOLIC PANEL
BUN: 23 mg/dL (ref 6–23)
Chloride: 100 mEq/L (ref 96–112)
Creatinine, Ser: 1.2 mg/dL (ref 0.4–1.2)
GFR: 44.91 mL/min — ABNORMAL LOW (ref >60.00)
Glucose, Bld: 89 mg/dL (ref 70–99)
Potassium: 4.5 mEq/L (ref 3.5–5.1)

## 2011-07-04 LAB — HEPATIC FUNCTION PANEL: Total Bilirubin: 0.8 mg/dL (ref 0.3–1.2)

## 2011-07-04 LAB — CBC WITH DIFFERENTIAL/PLATELET
Basophils Absolute: 0 10*3/uL (ref 0.0–0.1)
HCT: 32.8 % — ABNORMAL LOW (ref 36.0–46.0)
Lymphs Abs: 1.4 10*3/uL (ref 0.7–4.0)
MCV: 86.8 fl (ref 78.0–100.0)
Monocytes Absolute: 0.2 10*3/uL (ref 0.1–1.0)
Platelets: 206 10*3/uL (ref 150.0–400.0)
RDW: 13.9 % (ref 11.5–14.6)

## 2011-07-04 LAB — VITAMIN D 25 HYDROXY (VIT D DEFICIENCY, FRACTURES): Vit D, 25-Hydroxy: 48 ng/mL (ref 30–89)

## 2011-07-16 ENCOUNTER — Ambulatory Visit (INDEPENDENT_AMBULATORY_CARE_PROVIDER_SITE_OTHER): Payer: Medicare Other | Admitting: *Deleted

## 2011-07-16 DIAGNOSIS — Z2911 Encounter for prophylactic immunotherapy for respiratory syncytial virus (RSV): Secondary | ICD-10-CM

## 2011-07-16 DIAGNOSIS — Z0000 Encounter for general adult medical examination without abnormal findings: Secondary | ICD-10-CM

## 2011-08-17 ENCOUNTER — Other Ambulatory Visit: Payer: Self-pay | Admitting: Internal Medicine

## 2011-11-06 ENCOUNTER — Other Ambulatory Visit (INDEPENDENT_AMBULATORY_CARE_PROVIDER_SITE_OTHER): Payer: Medicare Other

## 2011-11-06 ENCOUNTER — Telehealth: Payer: Self-pay | Admitting: Internal Medicine

## 2011-11-06 DIAGNOSIS — IMO0001 Reserved for inherently not codable concepts without codable children: Secondary | ICD-10-CM

## 2011-11-06 DIAGNOSIS — Z789 Other specified health status: Secondary | ICD-10-CM

## 2011-11-06 LAB — BASIC METABOLIC PANEL
Chloride: 107 mEq/L (ref 96–112)
Potassium: 4.4 mEq/L (ref 3.5–5.1)
Sodium: 143 mEq/L (ref 135–145)

## 2011-11-06 NOTE — Telephone Encounter (Signed)
Future order placed, patient aware

## 2011-11-06 NOTE — Telephone Encounter (Signed)
Patient stated she called this morning and requested labs at [facility]. I do not see orders in nor do I see a phone note stating she called. Patient states she thinks it is for sodium reck., can you review and advise, if ok enter order and I will call patient back at [phone] Also does patient need to fast for this lab

## 2011-11-27 ENCOUNTER — Encounter: Payer: Self-pay | Admitting: Internal Medicine

## 2011-11-27 ENCOUNTER — Other Ambulatory Visit: Payer: Self-pay | Admitting: Internal Medicine

## 2011-11-27 ENCOUNTER — Ambulatory Visit (INDEPENDENT_AMBULATORY_CARE_PROVIDER_SITE_OTHER): Payer: Medicare Other | Admitting: Internal Medicine

## 2011-11-27 VITALS — BP 130/68 | HR 60 | Wt 156.6 lb

## 2011-11-27 DIAGNOSIS — D649 Anemia, unspecified: Secondary | ICD-10-CM

## 2011-11-27 DIAGNOSIS — E8881 Metabolic syndrome: Secondary | ICD-10-CM

## 2011-11-27 DIAGNOSIS — M25571 Pain in right ankle and joints of right foot: Secondary | ICD-10-CM

## 2011-11-27 DIAGNOSIS — I10 Essential (primary) hypertension: Secondary | ICD-10-CM

## 2011-11-27 DIAGNOSIS — E782 Mixed hyperlipidemia: Secondary | ICD-10-CM | POA: Diagnosis not present

## 2011-11-27 DIAGNOSIS — E559 Vitamin D deficiency, unspecified: Secondary | ICD-10-CM

## 2011-11-27 DIAGNOSIS — M25579 Pain in unspecified ankle and joints of unspecified foot: Secondary | ICD-10-CM

## 2011-11-27 DIAGNOSIS — N259 Disorder resulting from impaired renal tubular function, unspecified: Secondary | ICD-10-CM

## 2011-11-27 DIAGNOSIS — R7309 Other abnormal glucose: Secondary | ICD-10-CM

## 2011-11-27 MED ORDER — TRAMADOL HCL 50 MG PO TABS: ORAL_TABLET | ORAL | Status: DC

## 2011-11-27 NOTE — Progress Notes (Signed)
  Subjective:    Patient ID: [name], female    DOB: 1927-06-21, 78 y.o.   MRN: [id number]  HPI HYPERTENSION: Disease Monitoring: Blood pressure range-no list  Chest pain, palpitations- no      Dyspnea- no Medications: Compliance- yes  Lightheadedness,Syncope-no    Edema- minimally on occasion  FASTING HYPERGLYCEMIA, PMH of:  Disease Monitoring: Blood Sugar ranges-no monitor  Polyuria/phagia/dipsia- no       Visual problems- no Medications: Compliance- on ACE-I   HYPERLIPIDEMIA: Disease Monitoring: See symptoms for Hypertension Medications: Compliance- yes  Abd pain, bowel changes- no   Muscle aches- no but joint pain            Review of Systems Dr. Charlett Blake has prescribed Aleve twice a day for her arthralgias. She takes prophylactic 81 mg aspirin daily as well.     Objective:   Physical Exam Gen.:  well-nourished in appearance. Alert, appropriate and cooperative throughout exam. Eyes: No corneal or conjunctival inflammation noted.  Neck: No deformities, masses, or tenderness noted.  Thyroid normal. Lungs: Normal respiratory effort; chest expands symmetrically. Lungs are clear to auscultation without rales, wheezes, or increased work of breathing. Heart: Slow rate and regular rhythm. Normal S1 and S2. No gallop, click, or rub. No murmur. Abdomen: Bowel sounds normal; abdomen soft and nontender. No masses, organomegaly or hernias noted.                                                                 Musculoskeletal/extremities: No clubbing, cyanosis, edema, or deformity noted. Finger joints normal. Nail health  good. Vascular: Carotid, radial artery, dorsalis pedis and  posterior tibial pulses are full and equal. No bruits present. Neurologic: Alert and oriented x3. Deep tendon reflexes symmetrical and normal.          Skin: Intact without suspicious lesions or rashes. Psych: Mood and affect are normal. Normally interactive                                                                                          Assessment & Plan:

## 2011-11-27 NOTE — Patient Instructions (Addendum)
Blood Pressure Goal  Ideally is an AVERAGE < 135/85. This AVERAGE should be calculated from @ least 5-7 BP readings taken @ different times of day on different days of week. You should not respond to isolated BP readings , but rather the AVERAGE for that week  Please try to go on My Chart within the next 24 hours to allow me to release the results directly to you.  Please  schedule fasting Labs : BMET,A1c in 6-8 weeks. PLEASE BRING THESE INSTRUCTIONS TO FOLLOW UP  LAB APPOINTMENT.This will guarantee correct labs are drawn, eliminating need for repeat blood sampling ( needle sticks ! ). Diagnoses /Codes: 790.29,401.9.

## 2011-11-27 NOTE — Assessment & Plan Note (Signed)
As noted she takes nonsteroidals twice a day; a trial of tramadol will be substituted with renal function monitor.

## 2011-11-27 NOTE — Assessment & Plan Note (Signed)
Vitamin D level was therapeutic at 48 in January 2013; no change indicated

## 2011-11-28 NOTE — Addendum Note (Signed)
Addended by: Maurice Small on: 11/28/2011 10:24 AM   Modules accepted: Orders

## 2011-12-10 ENCOUNTER — Other Ambulatory Visit: Payer: Self-pay | Admitting: Internal Medicine

## 2011-12-10 MED ORDER — LORAZEPAM 0.5 MG PO TABS: 0.5000 mg | ORAL_TABLET | Freq: Three times a day (TID) | ORAL | Status: DC | PRN

## 2011-12-10 NOTE — Telephone Encounter (Signed)
refill lorazepam 0.5mg  tab #60 take one tablet by mouth every eight hours as needed Last fill 3.15.13 Last ov 6.25.13

## 2011-12-10 NOTE — Telephone Encounter (Signed)
RX called in .

## 2011-12-19 DIAGNOSIS — H2510 Age-related nuclear cataract, unspecified eye: Secondary | ICD-10-CM | POA: Diagnosis not present

## 2011-12-19 DIAGNOSIS — H35369 Drusen (degenerative) of macula, unspecified eye: Secondary | ICD-10-CM | POA: Diagnosis not present

## 2011-12-19 DIAGNOSIS — H04129 Dry eye syndrome of unspecified lacrimal gland: Secondary | ICD-10-CM | POA: Diagnosis not present

## 2011-12-19 DIAGNOSIS — H353190 Nonexudative age-related macular degeneration, unspecified eye, stage unspecified: Secondary | ICD-10-CM | POA: Diagnosis not present

## 2011-12-19 DIAGNOSIS — H43399 Other vitreous opacities, unspecified eye: Secondary | ICD-10-CM | POA: Diagnosis not present

## 2011-12-26 ENCOUNTER — Encounter: Payer: Self-pay | Admitting: Internal Medicine

## 2011-12-26 ENCOUNTER — Telehealth: Payer: Self-pay | Admitting: Internal Medicine

## 2011-12-26 ENCOUNTER — Ambulatory Visit (INDEPENDENT_AMBULATORY_CARE_PROVIDER_SITE_OTHER): Payer: Medicare Other | Admitting: Internal Medicine

## 2011-12-26 VITALS — BP 124/62 | HR 67 | Temp 97.6°F | Wt 154.0 lb

## 2011-12-26 DIAGNOSIS — R5383 Other fatigue: Secondary | ICD-10-CM

## 2011-12-26 DIAGNOSIS — N259 Disorder resulting from impaired renal tubular function, unspecified: Secondary | ICD-10-CM

## 2011-12-26 DIAGNOSIS — R5381 Other malaise: Secondary | ICD-10-CM | POA: Diagnosis not present

## 2011-12-26 DIAGNOSIS — E039 Hypothyroidism, unspecified: Secondary | ICD-10-CM | POA: Diagnosis not present

## 2011-12-26 DIAGNOSIS — I10 Essential (primary) hypertension: Secondary | ICD-10-CM

## 2011-12-26 NOTE — Telephone Encounter (Signed)
Decrease lisinopril to one half pill daily and monitor blood pressure.Blood Pressure Goal  Ideally is an AVERAGE < 135/85. This AVERAGE should be calculated from @ least 5-7 BP readings taken @ different times of day on different days of week. You should not respond to isolated BP readings , but rather the AVERAGE for that week . Office visit with all meds if symptoms persist or progress

## 2011-12-26 NOTE — Telephone Encounter (Signed)
Left message to call office

## 2011-12-26 NOTE — Patient Instructions (Addendum)
Blood Pressure Goal  Ideally is an AVERAGE < 135/85. This AVERAGE should be calculated from @ least 5-7 BP readings taken @ different times of day on different days of week. You should not respond to isolated BP readings , but rather the AVERAGE for that week  Please try to go on My Chart within the next 24 hours to allow me to release the results directly to you.  

## 2011-12-26 NOTE — Telephone Encounter (Signed)
Caller: [name]/Mother; PCP: Marga Melnick; CB#: [phone];  Call regarding Blood Pressure Is Low. Onset 12/25/11.  Afebrile.   BP 129/51 P 65-70 in L arm.  Recent BP 123/48.  Reports checked BP d/t "energy level is off." Feels OK. Doing NL activities. No weakness. Says is not "eating right" d/t lives alone. Also reports not drinking enough water. Advised to see MD within 72 hrs for all other situations per Weakness Guideline.  Declined to schedule appt now d/t daughter must drive. Will check with daughter and call back for appt.

## 2011-12-26 NOTE — Progress Notes (Signed)
  Subjective:    Patient ID: [name], female    DOB: [birth year], 78 y.o.   MRN: [id number]  HPI She believes that she became overheated at the [employer] 12/25/11. She felt weak and monitor blood pressure. From yesterday today it has ranged from 119-131/42-51. Her intake has been suboptimal as she does not enjoy cooking for  or eating by herself .  Creatinine was 1.4, BUN 31, and GFR 38.29 on 11/06/11. Her thyroid was therapeutic and liver function tests normal in January    Review of Systems  She denies chest pain, palpitations, dyspnea, paroxysmal nocturnal dyspnea, or claudication.     Objective:   Physical Exam General appearance is one of good  nourishment w/o distress.  Eyes: No conjunctival inflammation or scleral icterus is present.  Oral exam: Dental hygiene is good; lips and gums are healthy appearing.There is no oropharyngeal erythema or exudate noted.   Neck: thyroid normal  Heart:  Normal rate and regular rhythm. S1 and S2 normal without gallop, murmur, click, rub or other extra sounds . Heart sounds distant   Lungs:Chest clear to auscultation; no wheezes, rhonchi,rales ,or rubs present.No increased work of breathing.   Abdomen: bowel sounds normal, soft and non-tender without masses, organomegaly or hernias noted.  No guarding or rebound   Skin:Warm & dry.  Intact without suspicious lesions or rashes ; no jaundice or tenting  Lymphatic: No lymphadenopathy is noted about the head, neck, axilla areas.              Assessment & Plan:  #1 malaise  #2 mild diastolic hypotension  #3 mild renal insufficiency  #4 statin therapy   #5 hypothyroidism  Plan: See orders and recommendations

## 2011-12-27 LAB — CBC WITH DIFFERENTIAL/PLATELET
Basophils Relative: 0.4 % (ref 0.0–3.0)
Eosinophils Absolute: 0.1 10*3/uL (ref 0.0–0.7)
Eosinophils Relative: 0.9 % (ref 0.0–5.0)
Lymphocytes Relative: 20.5 % (ref 12.0–46.0)
MCHC: 33.2 g/dL (ref 30.0–36.0)
MCV: 83.3 fl (ref 78.0–100.0)
Monocytes Absolute: 0.4 10*3/uL (ref 0.1–1.0)
Neutrophils Relative %: 72.3 % (ref 43.0–77.0)
Platelets: 266 10*3/uL (ref 150.0–400.0)
RBC: 3.93 Mil/uL (ref 3.87–5.11)
WBC: 7.3 10*3/uL (ref 4.5–10.5)

## 2011-12-27 LAB — AST: AST: 19 U/L (ref 0–37)

## 2011-12-27 LAB — BASIC METABOLIC PANEL
BUN: 20 mg/dL (ref 6–23)
Creatinine, Ser: 1.3 mg/dL — ABNORMAL HIGH (ref 0.4–1.2)
GFR: 41.32 mL/min — ABNORMAL LOW (ref >60.00)

## 2011-12-27 LAB — ALT: ALT: 13 U/L (ref 0–35)

## 2011-12-27 LAB — TSH: TSH: 1.34 u[IU]/mL (ref 0.35–5.50)

## 2012-01-02 NOTE — Telephone Encounter (Signed)
Discuss with patient  

## 2012-02-07 DIAGNOSIS — Z1231 Encounter for screening mammogram for malignant neoplasm of breast: Secondary | ICD-10-CM | POA: Diagnosis not present

## 2012-02-19 ENCOUNTER — Encounter: Payer: Self-pay | Admitting: Internal Medicine

## 2012-03-06 NOTE — Progress Notes (Signed)
This encounter was created in error - please disregard. This encounter was created in error - please disregard. This encounter was created in error - please disregard. 

## 2012-03-11 DIAGNOSIS — Z23 Encounter for immunization: Secondary | ICD-10-CM | POA: Diagnosis not present

## 2012-05-19 ENCOUNTER — Other Ambulatory Visit: Payer: Self-pay | Admitting: Internal Medicine

## 2012-05-19 MED ORDER — LORAZEPAM 0.5 MG PO TABS: 0.5000 mg | ORAL_TABLET | Freq: Three times a day (TID) | ORAL | Status: DC | PRN

## 2012-05-19 NOTE — Telephone Encounter (Signed)
refill LORazepam (Tab) 0.5 MG NO instructions listed --last ov 7.24.13 last fill 7.13.13

## 2012-05-19 NOTE — Telephone Encounter (Signed)
RX called in .

## 2012-07-19 ENCOUNTER — Other Ambulatory Visit: Payer: Self-pay

## 2012-08-27 ENCOUNTER — Other Ambulatory Visit: Payer: Self-pay | Admitting: Internal Medicine

## 2012-09-08 ENCOUNTER — Encounter: Payer: Self-pay | Admitting: Lab

## 2012-09-09 ENCOUNTER — Ambulatory Visit (INDEPENDENT_AMBULATORY_CARE_PROVIDER_SITE_OTHER): Payer: Medicare Other | Admitting: Internal Medicine

## 2012-09-09 ENCOUNTER — Encounter: Payer: Self-pay | Admitting: Internal Medicine

## 2012-09-09 VITALS — BP 130/74 | HR 82 | Temp 97.5°F | Resp 14 | Ht <= 58 in | Wt 155.0 lb

## 2012-09-09 DIAGNOSIS — I10 Essential (primary) hypertension: Secondary | ICD-10-CM | POA: Diagnosis not present

## 2012-09-09 DIAGNOSIS — Z0000 Encounter for general adult medical examination without abnormal findings: Secondary | ICD-10-CM | POA: Diagnosis not present

## 2012-09-09 DIAGNOSIS — M810 Age-related osteoporosis without current pathological fracture: Secondary | ICD-10-CM

## 2012-09-09 DIAGNOSIS — E782 Mixed hyperlipidemia: Secondary | ICD-10-CM

## 2012-09-09 DIAGNOSIS — E039 Hypothyroidism, unspecified: Secondary | ICD-10-CM

## 2012-09-09 DIAGNOSIS — Z860100 Personal history of colon polyps, unspecified: Secondary | ICD-10-CM

## 2012-09-09 LAB — BASIC METABOLIC PANEL
CO2: 24 mEq/L (ref 19–32)
Chloride: 99 mEq/L (ref 96–112)
Glucose, Bld: 91 mg/dL (ref 70–99)
Sodium: 133 mEq/L — ABNORMAL LOW (ref 135–145)

## 2012-09-09 LAB — CBC WITH DIFFERENTIAL/PLATELET
Basophils Absolute: 0 10*3/uL (ref 0.0–0.1)
Basophils Relative: 0.6 % (ref 0.0–3.0)
Eosinophils Absolute: 0.1 10*3/uL (ref 0.0–0.7)
Hemoglobin: 10.9 g/dL — ABNORMAL LOW (ref 12.0–15.0)
Lymphs Abs: 1.4 10*3/uL (ref 0.7–4.0)
MCHC: 34.1 g/dL (ref 30.0–36.0)
MCV: 86 fl (ref 78.0–100.0)
Monocytes Absolute: 0.5 10*3/uL (ref 0.1–1.0)
Neutro Abs: 3.9 10*3/uL (ref 1.4–7.7)
RBC: 3.73 Mil/uL — ABNORMAL LOW (ref 3.87–5.11)
RDW: 13 % (ref 11.5–14.6)

## 2012-09-09 LAB — HEPATIC FUNCTION PANEL
Albumin: 4 g/dL (ref 3.5–5.2)
Total Protein: 7 g/dL (ref 6.0–8.3)

## 2012-09-09 LAB — LIPID PANEL
Cholesterol: 209 mg/dL — ABNORMAL HIGH (ref 0–200)
HDL: 50 mg/dL (ref >39.00)
Triglycerides: 186 mg/dL — ABNORMAL HIGH (ref 0.0–149.0)

## 2012-09-09 MED ORDER — PRAVASTATIN SODIUM 20 MG PO TABS: ORAL_TABLET | ORAL | Status: DC

## 2012-09-09 MED ORDER — LISINOPRIL 10 MG PO TABS: 10.0000 mg | ORAL_TABLET | Freq: Every day | ORAL | Status: DC

## 2012-09-09 NOTE — Progress Notes (Signed)
Subjective:    Patient ID: [name], female    DOB: [birth year], 79 y.o.   MRN: [id number]  HPI Medicare Wellness Visit:  Psychosocial & medical history were reviewed as required by Medicare (abuse,antisocial behavioral risks,firearm risk).  Social history: caffeine:2 cups coffee/ day  , alcohol: no  ,  tobacco use:  Quit 1961  Exercise : minimal No home & personal  safety / fall risk Activities of daily living: no limitations  Seatbelt  and smoke alarm employed. Power of Attorney/Living Will status : in place Ophthalmology exam current Hearing evaluation not current Orientation :oriented X 3  Memory & recall :good Spelling or math testing:declined Mood & affect : normal . Depression / anxiety: denied Travel history :  never  Immunization status : Shingles /Flu/ PNA/ tetanus current Transfusion history:  none  Preventive health surveillance ( colonoscopy, BMD , mammograms,PAP as per protocol/ SOC): current ; no Gyn F/U Dental care:  Every 6 mos. Chart reviewed &  Updated. Active issues reviewed & addressed.      Review of Systems HYPERTENSION: Disease Monitoring: Blood pressure range- data not brought  Chest pain, palpitations- no       Dyspnea- no Medications: Compliance- yes  Lightheadedness,Syncope-no    Edema- intermittent  PMH of elevated FBS: Disease Monitoring: Blood Sugar ranges-no Polyuria/phagia/dipsia- no       Visual problems-no  HYPERLIPIDEMIA: Disease Monitoring: See symptoms for Hypertension Medications: Compliance- yes  Abd pain, bowel changes- no   Muscle aches- no      Objective:   Physical Exam Gen.: Healthy and well-nourished in appearance. Alert, appropriate and cooperative throughout exam. Appears younger than stated age  Head: Normocephalic without obvious abnormalities  Eyes: No corneal or conjunctival inflammation noted. Ptosis bilaterally. Extraocular motion intact. Vision grossly normal with lenses Ears: External  ear  exam reveals no significant lesions or deformities. Wax in  Both canals. Hearing is grossly decreased bilaterally. Nose: External nasal exam reveals no deformity or inflammation. Nasal mucosa are pink and moist. No lesions or exudates noted.   Mouth: Oral mucosa and oropharynx reveal no lesions or exudates. Teeth in good repair. Neck: No deformities, masses, or tenderness noted. Range of motion decreased. Thyroid normal Lungs: Normal respiratory effort; chest expands symmetrically. Lungs are clear to auscultation without rales, wheezes, or increased work of breathing. Heart: Normal rate and rhythm. Normal S1 and S2. No gallop, click, or rub. Grade 1/2 over 6 systolic murmur. Abdomen: Bowel sounds normal; abdomen soft and nontender. No masses, organomegaly or hernias noted. Genitalia:As per Gyn                                  Musculoskeletal/extremities: Accentuated curvature of  thoracic spine.  No clubbing, cyanosis, edema, or significant extremity  deformity noted. Range of motion decreased @ knees .Tone & strength  Normal. Joints normal . Nail health good. Able to lie down & sit up w/o help. Negative SLR bilaterally Vascular: Carotid, radial artery, dorsalis pedis and  posterior tibial pulses are full and equal. Faint L carotid  bruit. Neurologic: Alert and oriented x3. Deep tendon reflexes symmetrical and normal.   Skin: Intact without suspicious lesions or rashes. Lymph: No cervical, axillary lymphadenopathy present. Psych: Mood and affect are normal. Normally interactive  Assessment & Plan:  #1 Medicare Wellness Exam; criteria met ; data entered #2 Problem List reviewed ; Assessment/ Recommendations made Plan: see Orders

## 2012-09-09 NOTE — Patient Instructions (Addendum)
Review and correct the record as indicated. Please share record with all medical staff seen.  

## 2012-09-10 LAB — LDL CHOLESTEROL, DIRECT: Direct LDL: 128.9 mg/dL

## 2012-09-11 DIAGNOSIS — Z85828 Personal history of other malignant neoplasm of skin: Secondary | ICD-10-CM | POA: Diagnosis not present

## 2012-09-11 DIAGNOSIS — L820 Inflamed seborrheic keratosis: Secondary | ICD-10-CM | POA: Diagnosis not present

## 2012-09-11 DIAGNOSIS — L570 Actinic keratosis: Secondary | ICD-10-CM | POA: Diagnosis not present

## 2012-09-11 DIAGNOSIS — C8409 Mycosis fungoides, extranodal and solid organ sites: Secondary | ICD-10-CM | POA: Diagnosis not present

## 2012-09-14 LAB — VITAMIN D 1,25 DIHYDROXY: Vitamin D 1, 25 (OH)2 Total: 33 pg/mL (ref 18–72)

## 2012-09-19 ENCOUNTER — Other Ambulatory Visit: Payer: Self-pay | Admitting: Internal Medicine

## 2012-10-09 DIAGNOSIS — H524 Presbyopia: Secondary | ICD-10-CM | POA: Diagnosis not present

## 2012-10-09 DIAGNOSIS — H353190 Nonexudative age-related macular degeneration, unspecified eye, stage unspecified: Secondary | ICD-10-CM | POA: Diagnosis not present

## 2012-10-09 DIAGNOSIS — H04129 Dry eye syndrome of unspecified lacrimal gland: Secondary | ICD-10-CM | POA: Diagnosis not present

## 2012-10-09 DIAGNOSIS — H43399 Other vitreous opacities, unspecified eye: Secondary | ICD-10-CM | POA: Diagnosis not present

## 2012-10-09 DIAGNOSIS — H2510 Age-related nuclear cataract, unspecified eye: Secondary | ICD-10-CM | POA: Diagnosis not present

## 2012-11-12 ENCOUNTER — Telehealth: Payer: Self-pay | Admitting: Internal Medicine

## 2012-11-12 DIAGNOSIS — T887XXA Unspecified adverse effect of drug or medicament, initial encounter: Secondary | ICD-10-CM

## 2012-11-12 DIAGNOSIS — E785 Hyperlipidemia, unspecified: Secondary | ICD-10-CM

## 2012-11-12 NOTE — Telephone Encounter (Signed)
Dispense 30, refill x2 of atorvastatin 10 mg Please  schedule fasting Labs in 12 weeks after new med: CK,Lipids, hepatic panel. PLEASE BRING THESE INSTRUCTIONS TO FOLLOW UP  LAB APPOINTMENT.This will guarantee correct labs are drawn, eliminating need for repeat blood sampling ( needle sticks ! ). Diagnoses /Codes: 454.0,981.19

## 2012-11-12 NOTE — Telephone Encounter (Signed)
Patient states that her Pravastatin Rx has gone up from $10 to $75. Patient is asking that another generic be sent to the pharmacy that is cheaper. Patient still uses [facility] on [address].

## 2012-11-12 NOTE — Telephone Encounter (Signed)
Tried to call pt line busy will try in AM.

## 2012-11-13 MED ORDER — ATORVASTATIN CALCIUM 10 MG PO TABS: 10.0000 mg | ORAL_TABLET | Freq: Every day | ORAL | Status: DC

## 2012-11-13 NOTE — Telephone Encounter (Signed)
Discuss with patient, Rx sent. 

## 2012-11-14 ENCOUNTER — Telehealth: Payer: Self-pay | Admitting: *Deleted

## 2012-11-14 MED ORDER — SIMVASTATIN 20 MG PO TABS: 20.0000 mg | ORAL_TABLET | Freq: Every day | ORAL | Status: DC

## 2012-11-14 NOTE — Telephone Encounter (Signed)
Discuss with patient, Rx sent will call with any reaction.

## 2012-11-14 NOTE — Telephone Encounter (Signed)
Pt has listed reaction with Ezetimibe-simvastatin this med causes diarrhea and nausea.Please advise

## 2012-11-14 NOTE — Telephone Encounter (Signed)
Simvastatin 20 mg #90.Please  schedule fasting Labs in 12 weeks after med changes: CK,Lipids, hepatic panel. PLEASE BRING THESE INSTRUCTIONS TO FOLLOW UP  LAB APPOINTMENT.This will guarantee correct labs are drawn, eliminating need for repeat blood sampling ( needle sticks ! ). Diagnoses /Codes: 161.0,960.45

## 2012-11-14 NOTE — Telephone Encounter (Signed)
Pt states that Lipitor was expensive as well. Pt states that pharmacy advise her that simvastatin would be the cheapest for her..Please advise   Pt will call her insurance as well to obtain a formulary.

## 2012-11-14 NOTE — Telephone Encounter (Signed)
Diarrhea would have been due to Zetia component; change # from 90 to 30  With 2 refills just in case

## 2012-11-18 ENCOUNTER — Other Ambulatory Visit: Payer: Self-pay | Admitting: Internal Medicine

## 2012-12-10 ENCOUNTER — Telehealth: Payer: Self-pay | Admitting: *Deleted

## 2012-12-10 MED ORDER — LORAZEPAM 0.5 MG PO TABS: 0.5000 mg | ORAL_TABLET | Freq: Three times a day (TID) | ORAL | Status: DC | PRN

## 2012-12-10 NOTE — Telephone Encounter (Signed)
Last OV 09-09-12, Last refilled 05-19-12 #60

## 2012-12-10 NOTE — Telephone Encounter (Signed)
OK X1 

## 2012-12-10 NOTE — Telephone Encounter (Signed)
Rx sent 

## 2012-12-15 ENCOUNTER — Telehealth: Payer: Self-pay | Admitting: Internal Medicine

## 2012-12-15 ENCOUNTER — Other Ambulatory Visit: Payer: Self-pay | Admitting: *Deleted

## 2012-12-15 MED ORDER — LEVOTHYROXINE SODIUM 75 MCG PO TABS: ORAL_TABLET | ORAL | Status: DC

## 2012-12-15 NOTE — Telephone Encounter (Signed)
Refilled Levothyroxine 75 mcg 12/15/12 Medication was e-scribed to [facility].

## 2012-12-15 NOTE — Telephone Encounter (Signed)
[name] is calling to let Dr. [name] know that her Simvastatin Rx is causing her to have muscle aches and feel sick to her stomach at night. She states that she has stopped taking it for three days and has not had those symptoms so she knows its the medication that is causing them. She is requesting that something else be prescribed.

## 2012-12-15 NOTE — Telephone Encounter (Signed)
Discuss with patient  

## 2012-12-15 NOTE — Telephone Encounter (Signed)
Stay off Simvastatin.  Eat a low-fat diet with lots of fruits and vegetables, up to 7-9 servings per day. Consume less than  30  Grams (preferably ZERO) of sugar per day from foods & drinks with High Fructose Corn Syrup (HFCS) sugar as #1,2,3 or # 4 on label.Whole Foods, [employer] & [employer] do not carry products with HFCS. Please  schedule fasting Labs in 12 weeks after nutrition changes: CK,Lipids, hepatic panel. PLEASE BRING THESE INSTRUCTIONS TO FOLLOW UP  LAB APPOINTMENT.This will guarantee correct labs are drawn, eliminating need for repeat blood sampling ( needle sticks ! ). Diagnoses /Codes:277.7

## 2013-02-23 DIAGNOSIS — Z1231 Encounter for screening mammogram for malignant neoplasm of breast: Secondary | ICD-10-CM | POA: Diagnosis not present

## 2013-02-25 DIAGNOSIS — Z23 Encounter for immunization: Secondary | ICD-10-CM | POA: Diagnosis not present

## 2013-03-04 ENCOUNTER — Other Ambulatory Visit: Payer: Self-pay | Admitting: Internal Medicine

## 2013-03-04 NOTE — Telephone Encounter (Signed)
OK X1 The medication is " as needed" and should not be taken on a regular basis. It should be taken :as needed only. Regular use can increase risk of addiction but more importantly affect level of alertness and balance with increased risk of falling. 

## 2013-03-04 NOTE — Telephone Encounter (Signed)
LORazepam (ATIVAN) 0.5 MG tablet Last OV 09/09/2012 Last refill: 12/10/2012 Contract on file  *Lisinopril refill sent to pharmacy

## 2013-03-05 NOTE — Telephone Encounter (Signed)
Refill done.  

## 2013-03-12 ENCOUNTER — Encounter: Payer: Self-pay | Admitting: Internal Medicine

## 2013-04-08 ENCOUNTER — Telehealth: Payer: Self-pay | Admitting: Internal Medicine

## 2013-04-08 DIAGNOSIS — E8881 Metabolic syndrome: Secondary | ICD-10-CM

## 2013-04-08 NOTE — Telephone Encounter (Signed)
Patient called and stated that its time for her to recheck her vitamin d level and chl. Please advise me with orders. Also patient wants to go to [place] lab to get the labs drawn thanks.

## 2013-04-09 ENCOUNTER — Other Ambulatory Visit: Payer: Self-pay

## 2013-04-10 DIAGNOSIS — H6120 Impacted cerumen, unspecified ear: Secondary | ICD-10-CM | POA: Diagnosis not present

## 2013-04-15 NOTE — Telephone Encounter (Signed)
Spoke with the pt and informed her that she did not need a Vit D level, but what she does need has been ordered and sent to [facility] and she can go at anytime to have it drawn.  Pt understood and agreed.//AB/CMA

## 2013-04-21 ENCOUNTER — Other Ambulatory Visit (INDEPENDENT_AMBULATORY_CARE_PROVIDER_SITE_OTHER): Payer: Medicare Other

## 2013-04-21 DIAGNOSIS — E8881 Metabolic syndrome: Secondary | ICD-10-CM | POA: Diagnosis not present

## 2013-04-21 LAB — LIPID PANEL
Cholesterol: 200 mg/dL (ref 0–200)
LDL Cholesterol: 121 mg/dL — ABNORMAL HIGH (ref 0–99)
Total CHOL/HDL Ratio: 4
VLDL: 29.6 mg/dL (ref 0.0–40.0)

## 2013-04-21 LAB — HEPATIC FUNCTION PANEL
ALT: 14 U/L (ref 0–35)
AST: 14 U/L (ref 0–37)
Bilirubin, Direct: 0.1 mg/dL (ref 0.0–0.3)
Total Bilirubin: 0.8 mg/dL (ref 0.3–1.2)
Total Protein: 7.3 g/dL (ref 6.0–8.3)

## 2013-04-21 LAB — CK: Total CK: 63 U/L (ref 7–177)

## 2013-05-14 ENCOUNTER — Telehealth: Payer: Self-pay | Admitting: Internal Medicine

## 2013-05-14 NOTE — Telephone Encounter (Signed)
Please take the probiotic ,Florastor twice a day until the bowels are normal. This will replace the normal bacteria which  are necessary for formation of normal stool and processing of food.OVINB

## 2013-05-14 NOTE — Telephone Encounter (Signed)
Advised per orders. Enacted verbal order of next 48 hours per Hopp. Patient repeats and voices understanding.

## 2013-05-14 NOTE — Telephone Encounter (Signed)
Patient says that she is having an issue with Diarrhea and is unable to make it in for an appointment this week. She would like to know if Dr. [name] can rx something to her pharmacy or recommend something OTC. Patient denies a fever and says she has checked her BP which is normal. Please advise.

## 2013-05-14 NOTE — Telephone Encounter (Signed)
Spoke with patient and advised of liquid and bland food to let the gut rest. Has not had any diarrhea today but is concerned that it will start back. Had two episodes last evening and has taken immoudin x 2. Would like for Hopp to send something in. Advised to call me later today to let me know how she is doing. Please advise if you would like to send something in for her.

## 2013-05-14 NOTE — Telephone Encounter (Signed)
Patient called and check on the status. Informed patient that as soon as we get a response we will let her know. Patient fully understood.

## 2013-05-18 ENCOUNTER — Telehealth: Payer: Self-pay | Admitting: Internal Medicine

## 2013-05-18 NOTE — Telephone Encounter (Signed)
Patient states that she was told to call to let [name] know how she was doing on the probiotic. Patient states that it has helped and she no longer has diarrhea. She does have some gas. Wants to know if Dr. [name] would suggest her taking Florastor regularly to help with "bad bacteria." Also wants to know if he would suggest her eating the yogurt that is supposed to help her digestive system. Please advise.

## 2013-05-19 NOTE — Telephone Encounter (Signed)
Please take a  probiotic , Florastor  OR Align ( whichever is cheaper) every day if the bowels are loose. This will replace the normal bacteria which  are necessary for formation of normal stool and processing of food.

## 2013-05-19 NOTE — Telephone Encounter (Signed)
Please advise.//AB/CMA 

## 2013-05-19 NOTE — Telephone Encounter (Signed)
Advised patient per recommendations. Voices understanding.  Advised to CB if symptoms reoccur or doesn't completely clear.

## 2013-06-01 ENCOUNTER — Telehealth: Payer: Self-pay | Admitting: *Deleted

## 2013-06-01 NOTE — Telephone Encounter (Signed)
Please take the probiotic , Align, every day until the bowels are normal. This will replace the normal bacteria which  are necessary for formation of normal stool and processing of food. 

## 2013-06-01 NOTE — Telephone Encounter (Signed)
Called and left message for patient to call back so I can inform her of Dr. Caryl Never recommendation below. JG//CMA

## 2013-06-01 NOTE — Telephone Encounter (Signed)
Patient called and stated that she was seen at a urgent care this past weekend for diarrhea and was told to take Align over the counter. Patient states that she is felling better but wanted to know could she still keep taking that medication.

## 2013-06-01 NOTE — Telephone Encounter (Signed)
Please advise 

## 2013-06-01 NOTE — Telephone Encounter (Signed)
Patient stated her bowels are now normal and will take the Probiotic for one more day. JG//CMA

## 2013-06-12 ENCOUNTER — Other Ambulatory Visit: Payer: Self-pay | Admitting: Internal Medicine

## 2013-06-15 NOTE — Telephone Encounter (Signed)
LORazepam (ATIVAN) 0.5 MG tablet Last refill: 03/04/13 #60, 0 refills Last OV: 09/09/12 Contract on file

## 2013-06-15 NOTE — Telephone Encounter (Signed)
Lorazepam script faxed to [facility]. JG//CMA

## 2013-06-15 NOTE — Telephone Encounter (Signed)
Ok X1 

## 2013-06-17 ENCOUNTER — Telehealth: Payer: Self-pay | Admitting: *Deleted

## 2013-06-17 NOTE — Telephone Encounter (Signed)
Patient called and stated that sometimes she still has diarrhea at times. Patient would like a nurse to call her back.

## 2013-06-18 NOTE — Telephone Encounter (Signed)
Called and left message for patient to return call.  

## 2013-06-26 ENCOUNTER — Encounter: Payer: Self-pay | Admitting: Internal Medicine

## 2013-06-26 ENCOUNTER — Ambulatory Visit (INDEPENDENT_AMBULATORY_CARE_PROVIDER_SITE_OTHER): Payer: BC Managed Care – PPO | Admitting: Internal Medicine

## 2013-06-26 ENCOUNTER — Ambulatory Visit: Payer: Medicare Other

## 2013-06-26 VITALS — BP 108/61 | HR 72 | Temp 97.8°F | Wt 149.6 lb

## 2013-06-26 DIAGNOSIS — R197 Diarrhea, unspecified: Secondary | ICD-10-CM | POA: Diagnosis not present

## 2013-06-26 DIAGNOSIS — E039 Hypothyroidism, unspecified: Secondary | ICD-10-CM | POA: Diagnosis not present

## 2013-06-26 DIAGNOSIS — D729 Disorder of white blood cells, unspecified: Secondary | ICD-10-CM

## 2013-06-26 LAB — TSH: TSH: 2.81 u[IU]/mL (ref 0.35–5.50)

## 2013-06-26 LAB — HEPATIC FUNCTION PANEL
ALT: 19 U/L (ref 0–35)
AST: 32 U/L (ref 0–37)
Albumin: 3.8 g/dL (ref 3.5–5.2)
Alkaline Phosphatase: 61 U/L (ref 39–117)
BILIRUBIN TOTAL: 0.7 mg/dL (ref 0.3–1.2)
Bilirubin, Direct: 0 mg/dL (ref 0.0–0.3)
Total Protein: 6.8 g/dL (ref 6.0–8.3)

## 2013-06-26 LAB — CBC WITH DIFFERENTIAL/PLATELET
BASOS ABS: 0 10*3/uL (ref 0.0–0.1)
Basophils Relative: 0.1 % (ref 0.0–3.0)
EOS ABS: 0 10*3/uL (ref 0.0–0.7)
Eosinophils Relative: 0 % (ref 0.0–5.0)
HEMATOCRIT: 31.6 % — AB (ref 36.0–46.0)
HEMOGLOBIN: 10.6 g/dL — AB (ref 12.0–15.0)
LYMPHS ABS: 0.4 10*3/uL — AB (ref 0.7–4.0)
Lymphocytes Relative: 2.9 % — ABNORMAL LOW (ref 12.0–46.0)
MCHC: 33.4 g/dL (ref 30.0–36.0)
MCV: 85.9 fl (ref 78.0–100.0)
Monocytes Absolute: 0.5 10*3/uL (ref 0.1–1.0)
Monocytes Relative: 4.2 % (ref 3.0–12.0)
NEUTROS ABS: 11.8 10*3/uL — AB (ref 1.4–7.7)
Neutrophils Relative %: 92.8 % — ABNORMAL HIGH (ref 43.0–77.0)
PLATELETS: 251 10*3/uL (ref 150.0–400.0)
RBC: 3.67 Mil/uL — ABNORMAL LOW (ref 3.87–5.11)
RDW: 15.3 % — AB (ref 11.5–14.6)
WBC: 12.8 10*3/uL — ABNORMAL HIGH (ref 4.5–10.5)

## 2013-06-26 LAB — BASIC METABOLIC PANEL
BUN: 23 mg/dL (ref 6–23)
CALCIUM: 9.1 mg/dL (ref 8.4–10.5)
CO2: 24 meq/L (ref 19–32)
Chloride: 95 mEq/L — ABNORMAL LOW (ref 96–112)
Creatinine, Ser: 1.3 mg/dL — ABNORMAL HIGH (ref 0.4–1.2)
GFR: 40.46 mL/min — ABNORMAL LOW (ref >60.00)
GLUCOSE: 96 mg/dL (ref 70–99)
POTASSIUM: 4.3 meq/L (ref 3.5–5.1)
SODIUM: 126 meq/L — AB (ref 135–145)

## 2013-06-26 NOTE — Progress Notes (Signed)
  To  Subjective:    Patient ID: [name], female    DOB: 09-19-1927, 80 y.o.   MRN: [id number]  HPI  "DIARRHEA"  Reports incident of bowel incontinence at Christmas and approximately every 1 accident/2 weeks since that time, described as watery stool where she can't make it to the bathroom. Having to wear Depends since that time; concerned that it could be greasy food, although noted a decreased appetite since Christmas. Reports increasing Lorazepam to 1mg  taking every 8 hrs when she feels she needs it.   Onset:October 2014  Time course: worse since 12/14   Description: loose stool until Align started ; now 1 formed stool / day & stool incontinence every 2 weeks  Number of stools per day: one Previous treatment: only Align Vomiting: no Abdominal Pain: no Weight loss:no  Decreased urine output: no Lightheadedness:no Recent travel history: no Sick contacts: no    Suspicious food or water: only greasy foods as these may be trigger for  incontinence watery stool   Red Flags Fever: no   Bloody stools: no Recent antibiotics:no Immuncompromised: no      Review of Systems Cardiovascular: chest pain, palpitations, exertional dyspnea, claudication, paroxysmal nocturnal dyspnea, or edema absent GI: no constipation;hoarseness;dysphagia Derm: no change in nails,hair,skin Neuro: no numbness or tremor.  Tingling in fingers @ night Psych:no anxiety; depression; panic attacks Endo: no temperature intolerance to heat ,cold      Objective:   Physical Exam General appearance is one of good health and nourishment w/o distress.Appears younger than stated age  Eyes: No conjunctival inflammation or scleral icterus is present.  Oral exam: Dental hygiene is good; lips and gums are healthy appearing.There is no oropharyngeal erythema or exudate noted. Tongue moist.   Heart:  Normal rate and regular rhythm. S1 and S2 normal without gallop, murmur, click, rub or other extra sounds      Lungs:Chest clear to auscultation; no wheezes, rhonchi,rales ,or rubs present.No increased work of breathing.   Abdomen: bowel sounds normal, soft and non-tender without masses, organomegaly or hernias noted.  No guarding or rebound   Skin:Warm & dry.  Intact without suspicious lesions or rashes ; no jaundice or tenting  Lymphatic: No lymphadenopathy is noted about the head, neck, axilla                  Assessment & Plan:  #1 abnormal bowels with episodic diarrhea. R/O IBS #2 see Problem List  Plan:see orders

## 2013-06-26 NOTE — Patient Instructions (Signed)
Please keep a food diary of possible food triggers. If you have frank diarrhea (frankly watery stool) look @  the previous one to 2 meals. For example fatty or greasy foods might exacerbate gallbladder disease. Other food triggers for bowel dysfunction include lactose (milk sugar) or wheat / gluten which can cause Sprue.

## 2013-06-26 NOTE — Progress Notes (Signed)
Pre visit review using our clinic review tool, if applicable. No additional management support is needed unless otherwise documented below in the visit note. 

## 2013-06-26 NOTE — Assessment & Plan Note (Signed)
TSH 

## 2013-06-27 ENCOUNTER — Other Ambulatory Visit: Payer: Self-pay | Admitting: Internal Medicine

## 2013-06-27 DIAGNOSIS — E871 Hypo-osmolality and hyponatremia: Secondary | ICD-10-CM | POA: Insufficient documentation

## 2013-06-27 DIAGNOSIS — N259 Disorder resulting from impaired renal tubular function, unspecified: Secondary | ICD-10-CM

## 2013-06-27 DIAGNOSIS — D649 Anemia, unspecified: Secondary | ICD-10-CM

## 2013-06-29 LAB — PATHOLOGIST SMEAR REVIEW

## 2013-07-02 ENCOUNTER — Telehealth: Payer: Self-pay

## 2013-07-02 ENCOUNTER — Other Ambulatory Visit: Payer: Self-pay | Admitting: *Deleted

## 2013-07-02 NOTE — Telephone Encounter (Signed)
Take the ranitidine  150 mg( #60 , RX 1 )30 minutes before breakfast and evening meal. If this does not control reflux symptoms;start omeprazole 20 mg  30 minutes before breakfast in place of the ranitidine.

## 2013-07-02 NOTE — Telephone Encounter (Signed)
Patient states that she is still having nausea without diarrhea, decreased appetite and afebrile.  She is unsure if she is depressed but reports feeling down because she can't get out and do things right now. Request something for the nausea. Please advise recommendations.

## 2013-07-03 ENCOUNTER — Encounter: Payer: Self-pay | Admitting: Internal Medicine

## 2013-07-03 ENCOUNTER — Ambulatory Visit (INDEPENDENT_AMBULATORY_CARE_PROVIDER_SITE_OTHER): Payer: Medicare Other | Admitting: Internal Medicine

## 2013-07-03 VITALS — BP 152/83 | HR 66 | Temp 97.7°F | Wt 145.2 lb

## 2013-07-03 DIAGNOSIS — R110 Nausea: Secondary | ICD-10-CM

## 2013-07-03 DIAGNOSIS — E871 Hypo-osmolality and hyponatremia: Secondary | ICD-10-CM

## 2013-07-03 DIAGNOSIS — D649 Anemia, unspecified: Secondary | ICD-10-CM

## 2013-07-03 DIAGNOSIS — N259 Disorder resulting from impaired renal tubular function, unspecified: Secondary | ICD-10-CM

## 2013-07-03 DIAGNOSIS — F411 Generalized anxiety disorder: Secondary | ICD-10-CM

## 2013-07-03 LAB — CBC WITH DIFFERENTIAL/PLATELET
Basophils Absolute: 0 10*3/uL (ref 0.0–0.1)
Basophils Relative: 0.3 % (ref 0.0–3.0)
EOS ABS: 0 10*3/uL (ref 0.0–0.7)
EOS PCT: 0.4 % (ref 0.0–5.0)
HEMATOCRIT: 35.7 % — AB (ref 36.0–46.0)
Hemoglobin: 11.6 g/dL — ABNORMAL LOW (ref 12.0–15.0)
LYMPHS ABS: 2 10*3/uL (ref 0.7–4.0)
Lymphocytes Relative: 19.8 % (ref 12.0–46.0)
MCHC: 32.5 g/dL (ref 30.0–36.0)
MCV: 87.4 fl (ref 78.0–100.0)
MONO ABS: 0.7 10*3/uL (ref 0.1–1.0)
Monocytes Relative: 6.6 % (ref 3.0–12.0)
Neutro Abs: 7.2 10*3/uL (ref 1.4–7.7)
Neutrophils Relative %: 72.9 % (ref 43.0–77.0)
PLATELETS: 375 10*3/uL (ref 150.0–400.0)
RBC: 4.09 Mil/uL (ref 3.87–5.11)
RDW: 15.7 % — ABNORMAL HIGH (ref 11.5–14.6)
WBC: 9.9 10*3/uL (ref 4.5–10.5)

## 2013-07-03 LAB — VITAMIN B12: VITAMIN B 12: 632 pg/mL (ref 211–911)

## 2013-07-03 LAB — IBC PANEL
IRON: 48 ug/dL (ref 42–145)
Saturation Ratios: 12.6 % — ABNORMAL LOW (ref 20.0–50.0)
Transferrin: 272.4 mg/dL (ref 212.0–360.0)

## 2013-07-03 LAB — BASIC METABOLIC PANEL
BUN: 13 mg/dL (ref 6–23)
CHLORIDE: 98 meq/L (ref 96–112)
CO2: 24 mEq/L (ref 19–32)
Calcium: 9.1 mg/dL (ref 8.4–10.5)
Creatinine, Ser: 1 mg/dL (ref 0.4–1.2)
GFR: 53.73 mL/min — ABNORMAL LOW (ref >60.00)
Glucose, Bld: 88 mg/dL (ref 70–99)
Potassium: 4 mEq/L (ref 3.5–5.1)
Sodium: 130 mEq/L — ABNORMAL LOW (ref 135–145)

## 2013-07-03 MED ORDER — RANITIDINE HCL 150 MG PO TABS: 150.0000 mg | ORAL_TABLET | Freq: Two times a day (BID) | ORAL | Status: DC

## 2013-07-03 NOTE — Progress Notes (Signed)
   Subjective:    Patient ID: [name], female    DOB: Feb 17, 1928, 80 y.o.   MRN: [id number]  HPI  NAUSEA  Onset: since 06/26/13  Time course: constant  Description: Decreased lorazepam dosage on 1/23 from 1 mg BID to 0.5 mg daily.  Previous treatment: Ematrol (?) taken on Wed/Thurs; symptoms unrelieved  Vomiting: no emesis  Emesis occurrences: 0  Anorexia: yes  Abdominal Pain: no  Last BM: today, normal . Probiotic discontinued Weight loss: 4 lbs since 1/23  Decreased urine output: no  Lightheadedness:no  Recent travel history: no  Sick contacts: no  Suspicious food or water: none  Red Flags:  Fever: no  Bloody stools: no  Recent antibiotics:no  Immuncompromised: no     Review of Systems  Cardiovascular: Chest pain, palpitations, exertional dyspnea, claudication, paroxysmal nocturnal dyspnea, or edema absent.  GI: no constipation;hoarseness;dysphagia  Derm: no change in nails,hair,skin  Neuro: no numbness or tremor. Tingling in fingers @ night  Psych:no anxiety; depression; panic attacks  Endo: no temperature intolerance to heat ,cold       Objective:   Physical Exam General appearance is one of good health and nourishment w/o distress.Appears younger than stated age  Eyes: No conjunctival inflammation or scleral icterus is present.  Oral exam: Dental hygiene is good; lips and gums are healthy appearing.There is no oropharyngeal erythema or exudate noted. Tongue moist.  Heart: Normal rate and regular rhythm. S1 and S2 normal without gallop, murmur, click, rub or other extra sounds  Lungs:Chest clear to auscultation; no wheezes, rhonchi,rales ,or rubs present.No increased work of breathing.  Abdomen: bowel sounds hypoactive, soft and non-tender without masses, organomegaly or hernias noted. No guarding or rebound  Skin:Warm & dry. Intact without suspicious lesions or rashes ; no jaundice, mild tenting  Lymphatic: No lymphadenopathy is noted about the head, neck,  axilla       Assessment & Plan:  #1 Nausea, R/O gastritis; R/O Lorazepam withdrawal reaction #2 Anxiety #3hyponatremia #4 leukocytosis #5 anemia See Orders

## 2013-07-03 NOTE — Patient Instructions (Signed)
Your next office appointment will be determined based upon review of your pending labs &/ or x-rays. Those instructions will be transmitted to you through My Chart  or by mail if you're not using this system.  Followup as needed for your this acute issue. Please report any significant change in your symptoms. Reflux of gastric acid may be asymptomatic as this may occur mainly during sleep.The triggers for reflux  include stress; the "aspirin family" ; alcohol; peppermint; and caffeine (coffee, tea, cola, and chocolate). The aspirin family would include aspirin and the nonsteroidal agents such as ibuprofen &  Naproxen. Tylenol would not cause reflux. If having symptoms ; food & drink should be avoided for @ least 2 hours before going to bed.

## 2013-07-03 NOTE — Addendum Note (Signed)
Addended by: Reino Bellis on: 07/03/2013 08:43 AM   Modules accepted: Orders

## 2013-07-03 NOTE — Progress Notes (Signed)
   Subjective:    Patient ID: [name], female    DOB: [birth year], 80 y.o.   MRN: [id number]  HPI NAUSEA  Onset: since 06/26/13  Time course: constant  Description: Decreased lorazepam dosage on 1/23 from 1 mg BID to 0.5 mg daily.  Previous treatment: Ematrol (?) taken on Wed/Thurs; symptoms unrelieved Vomiting: no emesis  Emesis occurrences: 0  Anorexia: yes Abdominal Pain: no  Last BM: today, normal Weight loss: 4 lbs since 1/23 Decreased urine output: no  Lightheadedness:no  Recent travel history: no  Sick contacts: no  Suspicious food or water: none Red Flags:  Fever: no  Bloody stools: no  Recent antibiotics:no  Immuncompromised: no     Review of Systems Cardiovascular: Chest pain, palpitations, exertional dyspnea, claudication, paroxysmal nocturnal dyspnea, or edema absent. GI: no constipation;hoarseness;dysphagia  Derm: no change in nails,hair,skin  Neuro: no numbness or tremor. Tingling in fingers @ night  Psych:no anxiety; depression; panic attacks  Endo: no temperature intolerance to heat ,cold     Objective:   Physical Exam  General appearance is one of good health and nourishment w/o distress.Appears younger than stated age  Eyes: No conjunctival inflammation or scleral icterus is present.  Oral exam: Dental hygiene is good; lips and gums are healthy appearing.There is no oropharyngeal erythema or exudate noted. Tongue moist.  Heart: Normal rate and regular rhythm. S1 and S2 normal without gallop, murmur, click, rub or other extra sounds  Lungs:Chest clear to auscultation; no wheezes, rhonchi,rales ,or rubs present.No increased work of breathing.  Abdomen: bowel sounds hypoactive, soft and non-tender without masses, organomegaly or hernias noted. No guarding or rebound  Skin:Warm & dry. Intact without suspicious lesions or rashes ; no jaundice, mild tenting  Lymphatic: No lymphadenopathy is noted about the head, neck, axilla       Assessment &  Plan:  #1 nausea r/o gastritis #2 hyponatremia #3 leukocytosis #4 B12 deficiency #5 anxiety

## 2013-07-03 NOTE — Progress Notes (Signed)
Pre visit review using our clinic review tool, if applicable. No additional management support is needed unless otherwise documented below in the visit note. 

## 2013-07-03 NOTE — Telephone Encounter (Addendum)
Spoke with patient and advised per recommendations. Patient states that she feels so bad that she would like to be seen. Scheduled her an appointment for today.  Did not send any meds in yet.

## 2013-07-06 ENCOUNTER — Telehealth: Payer: Self-pay | Admitting: Internal Medicine

## 2013-07-06 NOTE — Telephone Encounter (Signed)
Spoke with patient and she has had nausea without vomiting since December. She saw Dr. Linna Darner and has been on Zantac without relief. She can only come on Monday and Friday's due to transportation. Scheduled with Nicoletta Ba, PA on 07/10/13 at 9:30 AM.

## 2013-07-07 ENCOUNTER — Encounter: Payer: Self-pay | Admitting: Physician Assistant

## 2013-07-10 ENCOUNTER — Ambulatory Visit (INDEPENDENT_AMBULATORY_CARE_PROVIDER_SITE_OTHER): Payer: Medicare Other | Admitting: Physician Assistant

## 2013-07-10 ENCOUNTER — Other Ambulatory Visit (INDEPENDENT_AMBULATORY_CARE_PROVIDER_SITE_OTHER): Payer: Medicare Other

## 2013-07-10 ENCOUNTER — Encounter: Payer: Self-pay | Admitting: Physician Assistant

## 2013-07-10 VITALS — BP 138/72 | HR 80 | Ht <= 58 in | Wt 144.0 lb

## 2013-07-10 DIAGNOSIS — R634 Abnormal weight loss: Secondary | ICD-10-CM | POA: Diagnosis not present

## 2013-07-10 DIAGNOSIS — R110 Nausea: Secondary | ICD-10-CM

## 2013-07-10 LAB — HEPATIC FUNCTION PANEL
ALT: 14 U/L (ref 0–35)
AST: 17 U/L (ref 0–37)
Albumin: 4.3 g/dL (ref 3.5–5.2)
Alkaline Phosphatase: 68 U/L (ref 39–117)
Bilirubin, Direct: 0.1 mg/dL (ref 0.0–0.3)
TOTAL PROTEIN: 7.4 g/dL (ref 6.0–8.3)
Total Bilirubin: 0.9 mg/dL (ref 0.3–1.2)

## 2013-07-10 MED ORDER — ONDANSETRON HCL 4 MG PO TABS: ORAL_TABLET | ORAL | Status: DC

## 2013-07-10 NOTE — Progress Notes (Signed)
Subjective:    Patient ID: [name], female    DOB: 1928/04/19, 80 y.o.   MRN: [id number]  HPI  [name] is a very nice 80 year old white female known remotely to Dr. Delfin Edis from previous procedures. She had undergone colonoscopy in January 2009 with finding of hyperplastic polyps and diverticulosis and also had EGD at that same time with a mild acute gastritis. She is referred today for evaluation of persistent nausea and weight loss. Patient and her daughter states that she has had symptoms for over a month with a weight loss of 5-7 pounds. Patient admits to feeling anxious and somewhat depressed and her daughter feels that most of her symptoms may be due to depression. She says she wakes up feeling nauseated and is nauseated all day long, "queasy". She has not had any vomiting. Denies any dysphagia or odynophagia no heartburn, no abdominal pain or changes in her bowel habits. Apparently she had had some loose stools this past fall but that has resolved and she has not noted any melena or hematochezia. She is alone much of the day and says she has found herself thinking negative dots during the day and thinking about all of her friends and family who have died etc. She says she's also not sleeping well and is laying awake worrying. She has lorazepam but has only been using that as needed and currently only a quarter tablet. She says she has no appetite but is able to force herself to 8. She's not been on any new medications or had any other changes in her regimen. No prior abdominal surgery  Labs done January 30 hemoglobin 11.6 hematocrit 35.7 baseline for her WC 9.9 sodium 1:30 creatinine 1. no LFTs were done.     Review of Systems  Constitutional: Positive for appetite change and unexpected weight change.  HENT: Negative.   Eyes: Negative.   Respiratory: Negative.   Cardiovascular: Negative.   Gastrointestinal: Positive for nausea.  Endocrine: Negative.   Genitourinary:  Negative.   Musculoskeletal: Negative.   Allergic/Immunologic: Negative.   Neurological: Negative.   Hematological: Negative.   Psychiatric/Behavioral: Positive for dysphoric mood. The patient is nervous/anxious.    Outpatient Prescriptions Prior to Visit  Medication Sig Dispense Refill  . aspirin 81 MG tablet Take 81 mg by mouth daily.        . Calcium Carbonate-Vitamin D (CALTRATE 600+D PO) Take by mouth daily.      . furosemide (LASIX) 40 MG tablet TAKE HALF TABLET BY MOUTH DAILY   45 tablet  5  . levothyroxine (SYNTHROID, LEVOTHROID) 75 MCG tablet TAKE 1 TABLET BY MOUTH DAILY WITH THE EXCEPTION OF TUESDAYS ANDSATURDAYS TAKE 1/2 TABLET AS DIRECTED.  72 tablet  3  . lisinopril (PRINIVIL,ZESTRIL) 20 MG tablet Take 10 tablets by mouth daily.      Marland Kitchen LORazepam (ATIVAN) 0.5 MG tablet TAKE ONE TABLET BY MOUTH EVERY EIGHT HOURS AS NEEDED   60 tablet  0  . Multiple Vitamins-Minerals (OCUVITE PO) Take by mouth daily.        . ranitidine (ZANTAC) 150 MG tablet Take 1 tablet (150 mg total) by mouth 2 (two) times daily.  60 tablet  2   No facility-administered medications prior to visit.   Allergies  Allergen Reactions  . Achromycin [Tetracycline Hcl]     Facial swelling   . Omnipen [Ampicillin]     Facial swelling   . Simvastatin     12/15/12 myalgias  . Celecoxib  REACTION: bleeding  . Ezetimibe-Simvastatin     REACTION: diarrhea, N/V   Patient Active Problem List   Diagnosis Date Noted  . Hyponatremia 06/27/2013  . HYPERTROPHIC CARDIOMYOPATHY 08/23/2009  . ABNORMAL ELECTROCARDIOGRAM 07/18/2009  . UNSPECIFIED VITAMIN D DEFICIENCY 01/21/2009  . RENAL INSUFFICIENCY 01/21/2009  . COLONIC POLYPS, HX OF 01/21/2009  . ANEMIA-NOS 01/21/2009  . MYCOSIS FUNGOIDES 05/21/2008  . DIVERTICULOSIS, COLON 05/21/2008  . OSTEOARTHRITIS 06/02/2007  . HYPOTHYROIDISM 04/28/2007  . HYPERLIPIDEMIA 04/28/2007  . METABOLIC SYNDROME X 93/23/5573  . HYPERTENSION, ESSENTIAL NOS 04/28/2007  .  OSTEOPOROSIS 04/28/2007  . FIBROMYALGIA 11/14/2006     History  Substance Use Topics  . Smoking status: Former Smoker    Quit date: 06/05/1959  . Smokeless tobacco: Not on file     Comment: smoked 20-40, up to 1/2 ppd  . Alcohol Use: No   family history includes Diabetes in her mother; Heart attack (age of onset: 82) in her maternal uncle; Hypertension in her maternal grandmother; Stroke (age of onset: 16) in her maternal grandmother. There is no history of Cancer.     Objective:   Physical Exam well-developed elderly white female very pleasant in no acute distress accompanied by her daughter blood pressure 138/72 pulse 80 height 4 foot 9 weight 144. HEENT; nontraumatic normocephalic EOMI PERRLA sclera anicteric, Supple; no JVD, Cardiovascular ;regular rate and rhythm with S1-S2 no murmur or gallop, Pulmonary; clear bilaterally, Abdomen; soft nontender nondistended no palpable mass or hepatosplenomegaly bowel sounds are present, Rectal ;exam not done, Extremities ;no clubbing cyanosis or edema skin warm and dry, Psych; mood and affect normal and appropriate        Assessment & Plan:  # 64  80 year old white female with several week history of persistent nausea and 5-7 pound weight loss. Nausea appears to be in the setting of dysphoria and increased anxiety and may be due to underlying depression. Organic causes should be ruled out #2 history of hyperplastic polyps last colonoscopy 2009 #3 hypothyroidism #4 hypertension #5 hypertrophic cardiomyopathy #6 history of fibromyalgia #7 mild chronic anemia normocytic  Plan; check hepatic panel today to complete baseline labs CT scan of the abdomen and pelvis with contrast Start Zofran 4 mg every morning and may take every 8 hours as needed for nausea Encourage patient to use the lorazepam and take one half tablet every morning and one half tablet each evening Stop Zantac Start Zegerid 40 mg every morning-samples given Patient and daughter  encouraged to contact Dr. Clayborn Heron office regarding a trial of antidepressant if the above measures are not quickly helpful We discussed possibility of an upper endoscopy as well

## 2013-07-10 NOTE — Patient Instructions (Signed)
Please go to the basement level to have your labs drawn.  We sent a prescription for Zofran for nausea to [facility].    You have been scheduled for a CT scan of the abdomen and pelvis at [facility] ([address]---this is in the same building as Press [facility]).   You are scheduled on Friday 07-17-2013 at 9:00 am . You should arrive at 8:45 AM  prior to your appointment time for registration. Please follow the written instructions below on the day of your exam:  WARNING: IF YOU ARE ALLERGIC TO IODINE/X-RAY DYE, PLEASE NOTIFY RADIOLOGY IMMEDIATELY AT [phone]! YOU WILL BE GIVEN A 13 HOUR PREMEDICATION PREP.  1) Do not eat or drink anything after 5:00 am  (4 hours prior to your test) 2) You have been given 2 bottles of oral contrast to drink. The solution may taste better if refrigerated, but do NOT add ice or any other liquid to this solution. Shake well before drinking.    Drink 1 bottle of contrast @ 7:00 am  (2 hours prior to your exam)  Drink 1 bottle of contrast @ 8:00 am  (1 hour prior to your exam)  You may take any medications as prescribed with a small amount of water except for the following: Metformin, Glucophage, Glucovance, Avandamet, Riomet, Fortamet, Actoplus Met, Janumet, Glumetza or Metaglip. The above medications must be held the day of the exam AND 48 hours after the exam.  The purpose of you drinking the oral contrast is to aid in the visualization of your intestinal tract. The contrast solution may cause some diarrhea. Before your exam is started, you will be given a small amount of fluid to drink. Depending on your individual set of symptoms, you may also receive an intravenous injection of x-ray contrast/dye. Plan on being at [facility] for 30 minutes or long, depending on the type of exam you are having performed.  If you have any questions regarding your exam or if you need to reschedule, you may call the CT department at  [phone] between the hours of 8:00 am and 5:00 pm, Monday-Friday.  ________________________________________________________________________

## 2013-07-10 NOTE — Progress Notes (Signed)
Reviewed and agree.

## 2013-07-12 ENCOUNTER — Encounter: Payer: Self-pay | Admitting: Physician Assistant

## 2013-07-12 ENCOUNTER — Encounter: Payer: Self-pay | Admitting: Internal Medicine

## 2013-07-13 ENCOUNTER — Other Ambulatory Visit: Payer: Self-pay | Admitting: Internal Medicine

## 2013-07-13 MED ORDER — SERTRALINE HCL 25 MG PO TABS: 25.0000 mg | ORAL_TABLET | Freq: Every day | ORAL | Status: DC

## 2013-07-17 ENCOUNTER — Ambulatory Visit (INDEPENDENT_AMBULATORY_CARE_PROVIDER_SITE_OTHER)
Admission: RE | Admit: 2013-07-17 | Discharge: 2013-07-17 | Disposition: A | Discharge status: Home / Self Care | Payer: Medicare Other | Source: Ambulatory Visit | Attending: Physician Assistant | Admitting: Physician Assistant

## 2013-07-17 DIAGNOSIS — R634 Abnormal weight loss: Secondary | ICD-10-CM

## 2013-07-17 DIAGNOSIS — N289 Disorder of kidney and ureter, unspecified: Secondary | ICD-10-CM | POA: Diagnosis not present

## 2013-07-17 DIAGNOSIS — R110 Nausea: Secondary | ICD-10-CM

## 2013-07-17 MED ORDER — IOHEXOL 300 MG/ML  SOLN
100.0000 mL | Freq: Once | INTRAMUSCULAR | Status: AC | PRN
  Administered 2013-07-17: 100 mL via INTRAVENOUS

## 2013-07-18 ENCOUNTER — Encounter: Payer: Self-pay | Admitting: Internal Medicine

## 2013-07-18 ENCOUNTER — Inpatient Hospital Stay (HOSPITAL_COMMUNITY)
Admission: EM | Admit: 2013-07-18 | Discharge: 2013-07-20 | DRG: 641 | Disposition: A | Discharge status: Home / Self Care | Payer: Medicare Other | Attending: Internal Medicine | Admitting: Internal Medicine

## 2013-07-18 ENCOUNTER — Encounter (HOSPITAL_COMMUNITY): Payer: Self-pay | Admitting: Emergency Medicine

## 2013-07-18 DIAGNOSIS — E559 Vitamin D deficiency, unspecified: Secondary | ICD-10-CM | POA: Diagnosis present

## 2013-07-18 DIAGNOSIS — Z860100 Personal history of colon polyps, unspecified: Secondary | ICD-10-CM

## 2013-07-18 DIAGNOSIS — Z87891 Personal history of nicotine dependence: Secondary | ICD-10-CM | POA: Diagnosis not present

## 2013-07-18 DIAGNOSIS — R531 Weakness: Secondary | ICD-10-CM

## 2013-07-18 DIAGNOSIS — R5383 Other fatigue: Secondary | ICD-10-CM | POA: Diagnosis present

## 2013-07-18 DIAGNOSIS — Z79899 Other long term (current) drug therapy: Secondary | ICD-10-CM | POA: Diagnosis not present

## 2013-07-18 DIAGNOSIS — F339 Major depressive disorder, recurrent, unspecified: Secondary | ICD-10-CM | POA: Diagnosis present

## 2013-07-18 DIAGNOSIS — Z833 Family history of diabetes mellitus: Secondary | ICD-10-CM | POA: Diagnosis not present

## 2013-07-18 DIAGNOSIS — F329 Major depressive disorder, single episode, unspecified: Secondary | ICD-10-CM

## 2013-07-18 DIAGNOSIS — R9431 Abnormal electrocardiogram [ECG] [EKG]: Secondary | ICD-10-CM | POA: Diagnosis not present

## 2013-07-18 DIAGNOSIS — T43205A Adverse effect of unspecified antidepressants, initial encounter: Secondary | ICD-10-CM | POA: Diagnosis present

## 2013-07-18 DIAGNOSIS — F32A Depression, unspecified: Secondary | ICD-10-CM

## 2013-07-18 DIAGNOSIS — I422 Other hypertrophic cardiomyopathy: Secondary | ICD-10-CM | POA: Diagnosis present

## 2013-07-18 DIAGNOSIS — E8881 Metabolic syndrome: Secondary | ICD-10-CM | POA: Diagnosis present

## 2013-07-18 DIAGNOSIS — Z823 Family history of stroke: Secondary | ICD-10-CM | POA: Diagnosis not present

## 2013-07-18 DIAGNOSIS — R5381 Other malaise: Secondary | ICD-10-CM | POA: Diagnosis not present

## 2013-07-18 DIAGNOSIS — D649 Anemia, unspecified: Secondary | ICD-10-CM | POA: Diagnosis present

## 2013-07-18 DIAGNOSIS — Z7982 Long term (current) use of aspirin: Secondary | ICD-10-CM

## 2013-07-18 DIAGNOSIS — Z8719 Personal history of other diseases of the digestive system: Secondary | ICD-10-CM

## 2013-07-18 DIAGNOSIS — F419 Anxiety disorder, unspecified: Secondary | ICD-10-CM | POA: Diagnosis present

## 2013-07-18 DIAGNOSIS — R110 Nausea: Secondary | ICD-10-CM | POA: Diagnosis not present

## 2013-07-18 DIAGNOSIS — J438 Other emphysema: Secondary | ICD-10-CM | POA: Diagnosis present

## 2013-07-18 DIAGNOSIS — E039 Hypothyroidism, unspecified: Secondary | ICD-10-CM | POA: Diagnosis present

## 2013-07-18 DIAGNOSIS — T502X5A Adverse effect of carbonic-anhydrase inhibitors, benzothiadiazides and other diuretics, initial encounter: Secondary | ICD-10-CM | POA: Diagnosis present

## 2013-07-18 DIAGNOSIS — R911 Solitary pulmonary nodule: Secondary | ICD-10-CM | POA: Diagnosis present

## 2013-07-18 DIAGNOSIS — E785 Hyperlipidemia, unspecified: Secondary | ICD-10-CM | POA: Diagnosis present

## 2013-07-18 DIAGNOSIS — E871 Hypo-osmolality and hyponatremia: Secondary | ICD-10-CM | POA: Diagnosis not present

## 2013-07-18 DIAGNOSIS — Z8249 Family history of ischemic heart disease and other diseases of the circulatory system: Secondary | ICD-10-CM | POA: Diagnosis not present

## 2013-07-18 DIAGNOSIS — F341 Dysthymic disorder: Secondary | ICD-10-CM | POA: Diagnosis not present

## 2013-07-18 DIAGNOSIS — IMO0001 Reserved for inherently not codable concepts without codable children: Secondary | ICD-10-CM | POA: Diagnosis present

## 2013-07-18 DIAGNOSIS — I10 Essential (primary) hypertension: Secondary | ICD-10-CM | POA: Diagnosis not present

## 2013-07-18 HISTORY — DX: Reserved for inherently not codable concepts without codable children: IMO0001

## 2013-07-18 HISTORY — DX: Essential (primary) hypertension: I10

## 2013-07-18 HISTORY — DX: Hypo-osmolality and hyponatremia: E87.1

## 2013-07-18 HISTORY — DX: Mixed hyperlipidemia: E78.2

## 2013-07-18 HISTORY — DX: Diverticulosis of large intestine without perforation or abscess without bleeding: K57.30

## 2013-07-18 HISTORY — DX: Unspecified osteoarthritis, unspecified site: M19.90

## 2013-07-18 HISTORY — DX: Age-related osteoporosis without current pathological fracture: M81.0

## 2013-07-18 HISTORY — DX: Mycosis fungoides, extranodal and solid organ sites: C84.09

## 2013-07-18 HISTORY — DX: Solitary pulmonary nodule: R91.1

## 2013-07-18 HISTORY — DX: Vitamin D deficiency, unspecified: E55.9

## 2013-07-18 HISTORY — DX: Hypothyroidism, unspecified: E03.9

## 2013-07-18 LAB — CBC
HCT: 31.3 % — ABNORMAL LOW (ref 36.0–46.0)
Hemoglobin: 11 g/dL — ABNORMAL LOW (ref 12.0–15.0)
MCH: 28.9 pg (ref 26.0–34.0)
MCHC: 35.1 g/dL (ref 30.0–36.0)
MCV: 82.2 fL (ref 78.0–100.0)
Platelets: 253 K/uL (ref 150–400)
RBC: 3.81 MIL/uL — ABNORMAL LOW (ref 3.87–5.11)
RDW: 13.9 % (ref 11.5–15.5)
WBC: 5.5 K/uL (ref 4.0–10.5)

## 2013-07-18 LAB — URINALYSIS, ROUTINE W REFLEX MICROSCOPIC
Bilirubin Urine: NEGATIVE
Glucose, UA: NEGATIVE mg/dL
Hgb urine dipstick: NEGATIVE
Ketones, ur: NEGATIVE mg/dL
Nitrite: NEGATIVE
Protein, ur: NEGATIVE mg/dL
Specific Gravity, Urine: 1.008 (ref 1.005–1.030)
Urobilinogen, UA: 0.2 mg/dL (ref 0.0–1.0)
pH: 7 (ref 5.0–8.0)

## 2013-07-18 LAB — BASIC METABOLIC PANEL WITH GFR
BUN: 12 mg/dL (ref 6–23)
Calcium: 9.4 mg/dL (ref 8.4–10.5)
Chloride: 85 meq/L — ABNORMAL LOW (ref 96–112)

## 2013-07-18 LAB — BASIC METABOLIC PANEL
CO2: 23 mEq/L (ref 19–32)
Creatinine, Ser: 1.02 mg/dL (ref 0.50–1.10)
GFR calc Af Amer: 57 mL/min — ABNORMAL LOW (ref >90)
GFR calc non Af Amer: 49 mL/min — ABNORMAL LOW (ref >90)
Glucose, Bld: 112 mg/dL — ABNORMAL HIGH (ref 70–99)
Potassium: 4.3 mEq/L (ref 3.7–5.3)
Sodium: 122 mEq/L — ABNORMAL LOW (ref 137–147)

## 2013-07-18 LAB — OSMOLALITY: OSMOLALITY: 250 mosm/kg — AB (ref 275–300)

## 2013-07-18 LAB — URINE MICROSCOPIC-ADD ON

## 2013-07-18 LAB — TROPONIN I: Troponin I: <0.3 ng/mL (ref <0.30)

## 2013-07-18 LAB — CORTISOL: Cortisol, Plasma: 22.9 ug/dL

## 2013-07-18 MED ORDER — SODIUM CHLORIDE 0.9 % IV SOLN
INTRAVENOUS | Status: DC
  Administered 2013-07-18 – 2013-07-20 (×3): via INTRAVENOUS

## 2013-07-18 MED ORDER — SODIUM CHLORIDE 0.9 % IV SOLN
INTRAVENOUS | Status: AC
  Administered 2013-07-18: 125 mL/h via INTRAVENOUS

## 2013-07-18 MED ORDER — ONDANSETRON HCL 4 MG/2ML IJ SOLN: 4.0000 mg | Freq: Four times a day (QID) | INTRAMUSCULAR | Status: DC | PRN

## 2013-07-18 MED ORDER — ENOXAPARIN SODIUM 40 MG/0.4ML ~~LOC~~ SOLN
40.0000 mg | SUBCUTANEOUS | Status: DC
  Administered 2013-07-18 – 2013-07-19 (×2): 40 mg via SUBCUTANEOUS
  Filled 2013-07-18 (×3): qty 0.4

## 2013-07-18 MED ORDER — LISINOPRIL 20 MG PO TABS
20.0000 mg | ORAL_TABLET | Freq: Every morning | ORAL | Status: DC
  Administered 2013-07-18 – 2013-07-20 (×3): 20 mg via ORAL
  Filled 2013-07-18 (×3): qty 1

## 2013-07-18 MED ORDER — ONDANSETRON HCL 4 MG PO TABS
4.0000 mg | ORAL_TABLET | Freq: Four times a day (QID) | ORAL | Status: DC | PRN
  Administered 2013-07-18 – 2013-07-19 (×2): 4 mg via ORAL
  Filled 2013-07-18 (×2): qty 1

## 2013-07-18 MED ORDER — LORAZEPAM 0.5 MG PO TABS
0.5000 mg | ORAL_TABLET | Freq: Three times a day (TID) | ORAL | Status: DC | PRN
  Administered 2013-07-18: 0.5 mg via ORAL
  Filled 2013-07-18: qty 1

## 2013-07-18 MED ORDER — ASPIRIN 81 MG PO CHEW
81.0000 mg | CHEWABLE_TABLET | Freq: Every morning | ORAL | Status: DC
  Administered 2013-07-18 – 2013-07-20 (×3): 81 mg via ORAL
  Filled 2013-07-18 (×3): qty 1

## 2013-07-18 MED ORDER — PROSIGHT PO TABS
1.0000 | ORAL_TABLET | Freq: Every morning | ORAL | Status: DC
  Administered 2013-07-18 – 2013-07-20 (×3): 1 via ORAL
  Filled 2013-07-18 (×3): qty 1

## 2013-07-18 MED ORDER — ALUM & MAG HYDROXIDE-SIMETH 200-200-20 MG/5ML PO SUSP: 30.0000 mL | Freq: Four times a day (QID) | ORAL | Status: DC | PRN

## 2013-07-18 MED ORDER — LEVOTHYROXINE SODIUM 75 MCG PO TABS
37.5000 ug | ORAL_TABLET | ORAL | Status: DC
  Administered 2013-07-18: 15:00:00 via ORAL
  Filled 2013-07-18 (×2): qty 0.5

## 2013-07-18 MED ORDER — ACETAMINOPHEN 325 MG PO TABS
650.0000 mg | ORAL_TABLET | Freq: Once | ORAL | Status: AC
  Administered 2013-07-18: 650 mg via ORAL
  Filled 2013-07-18: qty 2

## 2013-07-18 MED ORDER — ONDANSETRON HCL 4 MG/2ML IJ SOLN: 4.0000 mg | Freq: Three times a day (TID) | INTRAMUSCULAR | Status: DC | PRN

## 2013-07-18 MED ORDER — ACETAMINOPHEN 325 MG PO TABS
650.0000 mg | ORAL_TABLET | Freq: Three times a day (TID) | ORAL | Status: DC
  Administered 2013-07-18 (×2): 650 mg via ORAL
  Filled 2013-07-18 (×5): qty 2

## 2013-07-18 MED ORDER — METOCLOPRAMIDE HCL 5 MG PO TABS
5.0000 mg | ORAL_TABLET | Freq: Three times a day (TID) | ORAL | Status: DC
  Administered 2013-07-18 – 2013-07-20 (×5): 5 mg via ORAL
  Filled 2013-07-18 (×8): qty 1

## 2013-07-18 MED ORDER — PANTOPRAZOLE SODIUM 40 MG PO TBEC
40.0000 mg | DELAYED_RELEASE_TABLET | Freq: Every day | ORAL | Status: DC
  Administered 2013-07-19 – 2013-07-20 (×2): 40 mg via ORAL
  Filled 2013-07-18 (×3): qty 1

## 2013-07-18 MED ORDER — LEVOTHYROXINE SODIUM 75 MCG PO TABS
75.0000 ug | ORAL_TABLET | ORAL | Status: DC
  Administered 2013-07-19 – 2013-07-20 (×2): 75 ug via ORAL
  Filled 2013-07-18 (×2): qty 1

## 2013-07-18 MED ORDER — VITAMIN D3 25 MCG (1000 UNIT) PO TABS
2000.0000 [IU] | ORAL_TABLET | Freq: Every morning | ORAL | Status: DC
  Administered 2013-07-18 – 2013-07-20 (×3): 2000 [IU] via ORAL
  Filled 2013-07-18 (×3): qty 2

## 2013-07-18 MED ORDER — SODIUM CHLORIDE 0.9 % IV BOLUS (SEPSIS)
1000.0000 mL | Freq: Once | INTRAVENOUS | Status: AC
  Administered 2013-07-18: 1000 mL via INTRAVENOUS

## 2013-07-18 NOTE — ED Provider Notes (Signed)
CSN: [id number]     Arrival date & time 07/18/13  [id number] History   First MD Initiated Contact with Patient 07/18/13 0745     Chief Complaint  Patient presents with  . Nausea  . Weakness     (Consider location/radiation/quality/duration/timing/severity/associated sxs/prior Treatment) HPI Comments: Pt feels more weak since yesterday.  Pt had an abd CT scan and had diarrhea ffrom the barium.  No abd pain, CP.  Pt has had nausea and poor appetite worsening for a few months.  Pt eats, but only small amounts, cereal, yogurt, cheese and crackers,.  She reports thinking of bigger meals like spagheti makes her nauseated.  She has seen PMD and Dr. Olevia Perches with GI due to these symptoms.  She has now been on lorazepam which she has used with bouts of minor depression in the past well, and was on zoloft for a few months 3 years ago when spouse passed and also did well.  She has been on zoloft now for 5 days, feels like she is hot, having bad thoughts at night and feels achy and she seems to be blaming the zoloft.  She didn't have this with prior use of zoloft.  Denies SI.  She has lost some weight over the last few months.  She states that she will feel hungry, will not have nausea, has no problems chewing or swallowing things like cereal, but then has problems with larger meals.  She ate cereal at 3 AM today.  No headache, blood in stool, back pain.  Pt lives alone.  Daughter notes pt doesn't do things with friends anymore, she has stopped watching the news due to the bad things she sees on the news.  She admits to worrying about her granddaughters a lot.    Patient is a 80 y.o. female presenting with weakness. The history is provided by the patient, a relative and medical records.  Weakness This is a new problem. Pertinent negatives include no chest pain and no abdominal pain.    Past Medical History  Diagnosis Date  . HTN (hypertension)   . Dysmetabolic syndrome X   . Hyperlipidemia   . Other abnormal  glucose    Past Surgical History  Procedure Laterality Date  . G 4 p 2    . Colonoscopy with polypectomy  07/25/2007    Dr Olevia Perches, Recheck in 7 years for 2009   Family History  Problem Relation Age of Onset  . Diabetes Mother   . Heart attack Maternal Uncle 13    his 2 sons had MI in 22s  . Hypertension Maternal Grandmother   . Stroke Maternal Grandmother 69  . Cancer Neg Hx    History  Substance Use Topics  . Smoking status: Former Smoker    Quit date: 06/05/1959  . Smokeless tobacco: Not on file     Comment: smoked 20-40, up to 1/2 ppd  . Alcohol Use: No   OB History   Grav Para Term Preterm Abortions TAB SAB Ect Mult Living                 Review of Systems  Constitutional: Negative for fever.  Cardiovascular: Negative for chest pain.  Gastrointestinal: Positive for nausea and diarrhea. Negative for abdominal pain.  Musculoskeletal: Negative for back pain.  Skin: Negative for rash.  Neurological: Positive for weakness.  Psychiatric/Behavioral: Positive for sleep disturbance and dysphoric mood. Negative for suicidal ideas, hallucinations, confusion, self-injury and agitation. The patient is nervous/anxious.   All other  systems reviewed and are negative.      Allergies  Achromycin; Omnipen; Simvastatin; Celecoxib; and Ezetimibe-simvastatin  Home Medications   Current Outpatient Rx  Name  Route  Sig  Dispense  Refill  . acetaminophen (TYLENOL) 325 MG tablet   Oral   Take 650 mg by mouth 3 (three) times daily.         Marland Kitchen aspirin 81 MG chewable tablet   Oral   Chew 81 mg by mouth every morning.         . beta carotene w/minerals (OCUVITE) tablet   Oral   Take 1 tablet by mouth every morning.         . cholecalciferol (VITAMIN D) 1000 UNITS tablet   Oral   Take 2,000 Units by mouth every morning.         . furosemide (LASIX) 40 MG tablet   Oral   Take 20 mg by mouth every morning.         Marland Kitchen levothyroxine (SYNTHROID, LEVOTHROID) 75 MCG  tablet   Oral   Take 37.5-75 mcg by mouth daily before breakfast. Take half of a tablet on Tuesdays and Saturdays. On all other days take 1 tablet         . lisinopril (PRINIVIL,ZESTRIL) 20 MG tablet   Oral   Take 10 tablets by mouth every morning.          Marland Kitchen LORazepam (ATIVAN) 0.5 MG tablet   Oral   Take 0.5 mg by mouth every 8 (eight) hours as needed for anxiety.         . ondansetron (ZOFRAN) 4 MG tablet   Oral   Take 4 mg by mouth 2 (two) times daily.         . sertraline (ZOLOFT) 25 MG tablet   Oral   Take 1 tablet (25 mg total) by mouth at bedtime.   30 tablet   1    BP 160/70  Pulse 66  Temp(Src) 98.6 F (37 C) (Oral)  Resp 20  SpO2 98% Physical Exam  Nursing note and vitals reviewed. Constitutional: She is oriented to person, place, and time. She appears well-developed and well-nourished. No distress.  HENT:  Head: Normocephalic and atraumatic.  Mouth/Throat: Oropharynx is clear and moist. No oropharyngeal exudate.  Eyes: Conjunctivae and EOM are normal. No scleral icterus.  Neck: Neck supple.  Cardiovascular: Normal rate, regular rhythm and intact distal pulses.   Pulmonary/Chest: Effort normal and breath sounds normal.  Abdominal: Soft. She exhibits no distension. There is no tenderness. There is no rebound and no guarding.  Musculoskeletal: She exhibits no edema and no tenderness.  Neurological: She is alert and oriented to person, place, and time.  Skin: Skin is warm and dry. No rash noted.  Psychiatric: Her mood appears anxious. Her affect is not blunt, not labile and not inappropriate. Her speech is not rapid and/or pressured, not delayed, not tangential and not slurred. She is not agitated, not aggressive, not hyperactive, not slowed, not withdrawn, not actively hallucinating and not combative. Thought content is not delusional. She does not express impulsivity. She exhibits a depressed mood. She expresses no suicidal ideation. She is communicative.  She exhibits normal recent memory and normal remote memory. She is attentive.    ED Course  Procedures (including critical care time) Labs Review Labs Reviewed  BASIC METABOLIC PANEL - Abnormal; Notable for the following:    Sodium 122 (*)    Chloride 85 (*)    Glucose, Bld  112 (*)    GFR calc non Af Amer 49 (*)    GFR calc Af Amer 57 (*)    All other components within normal limits  URINALYSIS, ROUTINE W REFLEX MICROSCOPIC  CBC  TROPONIN I   Imaging Review Ct Abdomen Pelvis W Contrast  07/17/2013   CLINICAL DATA:  10 lb weight loss and nausea for a few weeks. Abdominal discomfort beginning in late December. Decreased appetite. Hypertension.  EXAM: CT ABDOMEN AND PELVIS WITH CONTRAST  TECHNIQUE: Multidetector CT imaging of the abdomen and pelvis was performed using the standard protocol following bolus administration of intravenous contrast.  CONTRAST:  121mL OMNIPAQUE IOHEXOL 300 MG/ML  SOLN  COMPARISON:  None.  FINDINGS: Lower Chest: 5 mm left lower lobe lung nodule on image 6. Mild cardiomegaly. A small hiatal hernia. Trace bilateral pleural thickening.  Abdomen/Pelvis: No focal osseous lesion. Normal spleen, distal stomach. Moderate-sized periampullary duodenal diverticulum. Normal pancreas. A diverticulum is also identified at the duodenal jejunal junction on image 23.  Normal gallbladder, biliary tract, right adrenal gland. A left adrenal mass measures 3.4 x 3.8 cm on image 15 and has macroscopic fat within, most consistent with a myelolipoma.  An interpolar left renal lesion measures 2.3 cm and is also fat density, consistent with an angiomyolipoma. More ill-defined perirenal fat irregularity is likely related to extension of the capsular based angiomyolipoma.  Aortic and branch vessel atherosclerosis. No retroperitoneal or retrocrural adenopathy.  Extensive colonic diverticulosis with sigmoid muscular hypertrophy. Normal terminal ileum and appendix. Normal small bowel without abdominal  ascites.  No pelvic adenopathy. Pelvic floor laxity which is moderate. Otherwise normal urinary bladder and uterus, without adnexal mass or significant free pelvic fluid.  Bones/Musculoskeletal: Advanced lower lumbar spondylosis with mild osteopenia.  IMPRESSION: 1.  No acute process in the abdomen or pelvis. 2. 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 4. 5 mm left lower lobe lung nodule. If the patient is at high risk for bronchogenic carcinoma, follow-up chest CT at 6-12 months is recommended. If the patient is at low risk for bronchogenic carcinoma, follow-up chest CT at 12 months is recommended. This recommendation follows the consensus statement: "Guidelines for Management of Small Pulmonary Nodules Detected on CT Scans: A Statement from the [place]" as published in Radiology2005;237:395-400. Online at: https://www.arnold.com/.   Electronically Signed   By: Abigail Miyamoto M.D.   On: 07/17/2013 10:16    EKG Interpretation    Date/Time:  Saturday July 18 2013 09:08:39 EST Ventricular Rate:  62 PR Interval:  155 QRS Duration: 92 QT Interval:  440 QTC Calculation: 447 R Axis:   53 Text Interpretation:  Sinus rhythm Abnormal R-wave progression, early transition Abnormal T, consider ischemia, lateral leads Abnormal ECG No previous tracing Reconfirmed by [facility]  MD, MICHEAL ([id number]) on 07/18/2013 9:15:06 AM           RA sat is 98% and I interpret to be adequate   9:15 AM Pt's Na and Cl are low, Na is 122, suggesting she may be slightly encephalopathic.  Will need IVF's and brief admission.  May be a result of the diarrhea yesterday.  Normal renal function.  MDM   Final diagnoses:  Hyponatremia  Abnormal ECG  Weakness    I reviewed pt's records.  Her CT yesterday had no acute findings, discussed with pt and family.  Pt is in no distress.  Given her diarrhea yesterday, physically she may feel worse.  Will gvie IVF's, check BMP, UA  and ECG.  Otherwise symptoms seems more chronic and needs continued follow up with Dr. Linna Darner.  Pt and daughter voice understanding.      Saddie Benders. Alfie Rideaux, MD 07/18/13 1058

## 2013-07-18 NOTE — ED Notes (Signed)
Pt from home c/o nausea, weakness, inability to eat x 2 weeks. Pt had CT abd yesterday but has not received results. Pt was given Zofran for nausea, but is not helping. Pt has just been rxd Zoloft on Monday and believes she is having a reaction. Pt denies CP, SOB, V/D. Pt is A&O and in NAD

## 2013-07-18 NOTE — H&P (Signed)
History and Physical  [name]:[id number] DOB: March 17, 1928 DOA: 07/18/2013  Referring physician: Dr. Kingsley Spittle PCP: Unice Cobble, MD  GI: Velora Heckler  Chief Complaint: Weakness and nausea   History of Present Illness: [name] is an 80 y.o. female with a PMH of depression/anxiety, prior history of hyponatremia who presents with a 1-2 month history of nausea without vomiting, decreased oral intake, and weight loss.  Prior to this she had some issues with diarrhea.  No history of hematochezia or melena.  She had undergone colonoscopy in January 2009 with finding of hyperplastic polyps and diverticulosis and also had EGD at that same time with a mild acute gastritis. She saw Dr. Olevia Perches of gastroenterology on 07/10/13 who obtained a CT scan of the abdomen and pelvis on 07/17/13 with no significant pathology noted. The patient has also had problems with her mood including generalized anxiety, excessive worry, anhedonia, insomnia, and negativity. Her symptoms had been previously managed with Lorazepam, and the dosage was adjusted by her PCP on 07/03/13.  She was subsequently started on Zoloft, which she feels has worsened the insomnia.  She presented to the hospital today secondary to generalized weakness and persistent nausea. Upon initial evaluation in the ED, the patient was noted to be hyponatremic with sodium of 122.  Review of Systems: Constitutional: No fever, no chills;  Appetite diminished; + weight loss of 10 lbs in 6 months, no  weight gain, + fatigue.  HEENT: No blurry vision, no diplopia, no pharyngitis, no dysphagia CV: No chest pain, no palpitations, no PND.  Resp: No SOB, no cough, no pleuritic pain. GI: +nausea, no vomiting, + recent diarrhea, no melena, no hematochezia, no constipation.  GU: No dysuria, no hematuria, no frequency, no urgency. MSK: + nocturnal myalgias with ? Restless legs, no arthralgias.  Neuro:  No headache, no focal neurological deficits, no history of  seizures.  Psych: + depression, + anxiety, +insomnia.  Endo: No heat intolerance, + cold intolerance, no polyuria, no polydipsia  Skin: No rashes, no skin lesions.  Heme: + easy bruising.  Travel history:  None recent.  Past Medical History Past Medical History  Diagnosis Date  . Dysmetabolic syndrome X   . Hyperlipidemia   . Other abnormal glucose   . Hypothyroidism   . ANEMIA-NOS 01/21/2009    Qualifier: Diagnosis of  By: Linna Darner MD, Gwyndolyn Saxon   Hematocrit has ranged from 31.1-32.8 with normochromic, normocytic indices. B12 level was 370 in July 2012.    Marland Kitchen DIVERTICULOSIS, COLON 05/21/2008    Qualifier: Diagnosis of  By: Linna Darner MD, Eda Paschal POLYPS, HX OF 01/21/2009    Qualifier: Diagnosis of  By: Linna Darner MD, Gwyndolyn Saxon    . FIBROMYALGIA 11/14/2006    Qualifier: Diagnosis of  By: Linna Darner MD, Gwyndolyn Saxon    . HYPERLIPIDEMIA 04/28/2007    Qualifier: Diagnosis of  By: Linna Darner MD, Cristopher Estimable 2012: LDL 139 ( 2348/1176), HDL 51, TG 228. LDL goal = < 100, ideally < 70. M uncle MI @ 67.   Marland Kitchen HYPERTENSION, ESSENTIAL NOS 04/28/2007    Qualifier: Diagnosis of  By: Linna Darner MD, Gwyndolyn Saxon    . HYPERTROPHIC CARDIOMYOPATHY 08/23/2009    Qualifier: Diagnosis of  By: Aundra Dubin, MD, [place]    . Hyponatremia 06/27/2013  . MYCOSIS FUNGOIDES 05/21/2008    Qualifier: Diagnosis of  By: Linna Darner MD, Gwyndolyn Saxon   Dr Rolm Bookbinder, Derm    . OSTEOARTHRITIS 06/02/2007    Qualifier: Diagnosis of  By: Nils Pyle CMA (Deborra Medina), Mearl Latin    . OSTEOPOROSIS 04/28/2007    Qualifier: Diagnosis of  By: Linna Darner MD, Gwyndolyn Saxon    . RENAL INSUFFICIENCY 01/21/2009    Qualifier: Diagnosis of  By: Linna Darner MD, Gwyndolyn Saxon   02/16/11: BUN 25, creatinine 1.2, GFR 46.27  11/06/11: BUN 31, creatinine 1.4, GFR 38.29    . Unspecified vitamin D deficiency 01/21/2009    Qualifier: Diagnosis of  By: Linna Darner MD, Gwyndolyn Saxon    . Nodule of left lung 07/18/2013     Past Surgical History Past Surgical History  Procedure Laterality Date  . G 4 p 2    . Colonoscopy with  polypectomy  07/25/2007    Dr Olevia Perches, Recheck in 7 years for 2009     Social History: History   Social History  . Marital Status: Married    Spouse Name: N/A    Number of Children: 2  . Years of Education: N/A   Occupational History  .     Social History Main Topics  . Smoking status: Former Smoker    Quit date: 06/05/1959  . Smokeless tobacco: Not on file     Comment: smoked [age], up to 1/2 ppd  . Alcohol Use: No  . Drug Use: No  . Sexual Activity: Not on file   Other Topics Concern  . Not on file   Social History Narrative   Widowed.  Lives alone.  Ambulates without assistance.  Steady on feet.    Family History:  Family History  Problem Relation Age of Onset  . Diabetes Mother   . Heart attack Maternal Uncle 38    his 2 sons had MI in 60s  . Hypertension Maternal Grandmother   . Stroke Maternal Grandmother 69  . Cancer Neg Hx     Allergies: Achromycin; Omnipen; Simvastatin; Celecoxib; and Ezetimibe-simvastatin  Meds: Prior to Admission medications   Medication Sig Start Date End Date Taking? Authorizing Provider  acetaminophen (TYLENOL) 325 MG tablet Take 650 mg by mouth 3 (three) times daily.   Yes Historical Provider, MD  aspirin 81 MG chewable tablet Chew 81 mg by mouth every morning.   Yes Historical Provider, MD  beta carotene w/minerals (OCUVITE) tablet Take 1 tablet by mouth every morning.   Yes Historical Provider, MD  cholecalciferol (VITAMIN D) 1000 UNITS tablet Take 2,000 Units by mouth every morning.   Yes Historical Provider, MD  furosemide (LASIX) 40 MG tablet Take 20 mg by mouth every morning.   Yes Historical Provider, MD  levothyroxine (SYNTHROID, LEVOTHROID) 75 MCG tablet Take 37.5-75 mcg by mouth daily before breakfast. Take half of a tablet on Tuesdays and Saturdays. On all other days take 1 tablet   Yes Historical Provider, MD  lisinopril (PRINIVIL,ZESTRIL) 20 MG tablet Take 10 tablets by mouth every morning.  06/17/13  Yes Historical  Provider, MD  LORazepam (ATIVAN) 0.5 MG tablet Take 0.5 mg by mouth every 8 (eight) hours as needed for anxiety.   Yes Historical Provider, MD  ondansetron (ZOFRAN) 4 MG tablet Take 4 mg by mouth 2 (two) times daily.   Yes Historical Provider, MD  sertraline (ZOLOFT) 25 MG tablet Take 1 tablet (25 mg total) by mouth at bedtime. 07/13/13  Yes Hendricks Limes, MD    Physical Exam: Filed Vitals:   07/18/13 0735 07/18/13 1156  BP: 160/70 146/70  Pulse: 66 65  Temp: 98.6 F (37 C) 97.7 F (36.5 C)  TempSrc: Oral Oral  Resp: 20 18  SpO2: 98%  96%     Physical Exam: Blood pressure 146/70, pulse 65, temperature 97.7 F (36.5 C), temperature source Oral, resp. rate 18, SpO2 96.00%. Gen: No acute distress. Head: Normocephalic, atraumatic. Eyes: PERRL, EOMI, sclerae nonicteric. Mouth: Oropharynx clear with moist mucous membranes. Neck: Supple, no thyromegaly, no lymphadenopathy, no jugular venous distention. Chest: Lungs clear to auscultation bilaterally with good air movement. CV: Heart sounds are regular. No murmurs, rubs, or gallops. Abdomen: Soft, nontender, nondistended with normal active bowel sounds. Extremities: Extremities are without clubbing, edema, or cyanosis. Skin: Warm and dry. Neuro: Alert and oriented times 3; cranial nerves II through XII grossly intact. Psych: Mood and affect mildly depressed.  Labs on Admission:  Basic Metabolic Panel:  Recent Labs Lab 07/18/13 0755  NA 122*  K 4.3  CL 85*  CO2 23  GLUCOSE 112*  BUN 12  CREATININE 1.02  CALCIUM 9.4   CBC:  Recent Labs Lab 07/18/13 0800  WBC 5.5  HGB 11.0*  HCT 31.3*  MCV 82.2  PLT 253   Cardiac Enzymes:  Recent Labs Lab 07/18/13 0800  TROPONINI <0.30   Radiological Exams on Admission: Ct Abdomen Pelvis W Contrast  07/17/2013   CLINICAL DATA:  10 lb weight loss and nausea for a few weeks. Abdominal discomfort beginning in late December. Decreased appetite. Hypertension.  EXAM: CT ABDOMEN  AND PELVIS WITH CONTRAST  TECHNIQUE: Multidetector CT imaging of the abdomen and pelvis was performed using the standard protocol following bolus administration of intravenous contrast.  CONTRAST:  167mL OMNIPAQUE IOHEXOL 300 MG/ML  SOLN  COMPARISON:  None.  FINDINGS: Lower Chest: 5 mm left lower lobe lung nodule on image 6. Mild cardiomegaly. A small hiatal hernia. Trace bilateral pleural thickening.  Abdomen/Pelvis: No focal osseous lesion. Normal spleen, distal stomach. Moderate-sized periampullary duodenal diverticulum. Normal pancreas. A diverticulum is also identified at the duodenal jejunal junction on image 23.  Normal gallbladder, biliary tract, right adrenal gland. A left adrenal mass measures 3.4 x 3.8 cm on image 15 and has macroscopic fat within, most consistent with a myelolipoma.  An interpolar left renal lesion measures 2.3 cm and is also fat density, consistent with an angiomyolipoma. More ill-defined perirenal fat irregularity is likely related to extension of the capsular based angiomyolipoma.  Aortic and branch vessel atherosclerosis. No retroperitoneal or retrocrural adenopathy.  Extensive colonic diverticulosis with sigmoid muscular hypertrophy. Normal terminal ileum and appendix. Normal small bowel without abdominal ascites.  No pelvic adenopathy. Pelvic floor laxity which is moderate. Otherwise normal urinary bladder and uterus, without adnexal mass or significant free pelvic fluid.  Bones/Musculoskeletal: Advanced lower lumbar spondylosis with mild osteopenia.  IMPRESSION: 1.  No acute process in the abdomen or pelvis. 2. 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 4. 5 mm left lower lobe lung nodule. If the patient is at high risk for bronchogenic carcinoma, follow-up chest CT at 6-12 months is recommended. If the patient is at low risk for bronchogenic carcinoma, follow-up chest CT at 12 months is recommended. This recommendation follows the consensus statement: "Guidelines for  Management of Small Pulmonary Nodules Detected on CT Scans: A Statement from the [place]" as published in Radiology2005;237:395-400. Online at: [email or web address].arnold.com/.   Electronically Signed   By: Abigail Miyamoto M.D.   On: 07/17/2013 10:16    EKG: Independently reviewed. Sinus rhythm at 62 beats per minute. T wave inversions in aVL and V2 with T wave flattening V3 to V6.  Assessment/Plan Principal Problem:   Hyponatremia The patient has had problems  with hyponatremia in the past with documented sodium levels as well as 126. Her current sodium is lower than her usual baseline values of 126-130, and likely is a side effect of SSRI therapy. We'll hold Zoloft and send off urine sodium, urine osmolality, and serum osmolality.  TSH recently checked and was within normal limits. Check cortisol. Active Problems:   HYPOTHYROIDISM Continue Synthroid. TSH 2.81 on 06/26/13.   Generalized weakness Likely from hyponatremia. Physical therapy evaluation requested.   Anxiety and depression Psychiatry evaluation requested given hyponatremia with SSRI use.   Nodule of left lung Consider followup CT in one year, the patient considered to be low risk with only a remote history of tobacco use.   Nausea alone Continue Zofran as needed. Place on empiric Reglan and Protonix.   Normocytic anemia Iron 48, transferrin 272.4, B12 632 on prior testing.   DVT prophylaxis Lovenox ordered.  Code Status: Full. Family Communication: Desma Mcgregor (daughter): [phone]. Disposition Plan: Home when stable.  Time spent: 70 minutes.  Ison Wichmann [facility] Pager [phone] Cell: [phone]   If 7PM-7AM, please contact night-coverage [email or web address] Password [email or web address] 07/18/2013, 1:40 PM    **Disclaimer: This note was dictated with voice recognition software. Similar sounding words can inadvertently be transcribed and this note may contain transcription errors which may not  have been corrected upon publication of note.**

## 2013-07-19 DIAGNOSIS — R110 Nausea: Secondary | ICD-10-CM | POA: Diagnosis not present

## 2013-07-19 DIAGNOSIS — E039 Hypothyroidism, unspecified: Secondary | ICD-10-CM | POA: Diagnosis not present

## 2013-07-19 DIAGNOSIS — E871 Hypo-osmolality and hyponatremia: Secondary | ICD-10-CM | POA: Diagnosis not present

## 2013-07-19 DIAGNOSIS — F341 Dysthymic disorder: Secondary | ICD-10-CM | POA: Diagnosis not present

## 2013-07-19 LAB — BASIC METABOLIC PANEL
BUN: 9 mg/dL (ref 6–23)
CALCIUM: 8.7 mg/dL (ref 8.4–10.5)
CO2: 23 mEq/L (ref 19–32)
CREATININE: 1 mg/dL (ref 0.50–1.10)
Chloride: 94 mEq/L — ABNORMAL LOW (ref 96–112)
GFR calc Af Amer: 59 mL/min — ABNORMAL LOW (ref >90)
GFR calc non Af Amer: 51 mL/min — ABNORMAL LOW (ref >90)
GLUCOSE: 99 mg/dL (ref 70–99)
Potassium: 4.2 mEq/L (ref 3.7–5.3)
Sodium: 128 mEq/L — ABNORMAL LOW (ref 137–147)

## 2013-07-19 LAB — OSMOLALITY, URINE: Osmolality, Ur: 133 mOsm/kg — ABNORMAL LOW (ref 390–1090)

## 2013-07-19 LAB — SODIUM, URINE, RANDOM: SODIUM UR: 24 meq/L

## 2013-07-19 MED ORDER — ARIPIPRAZOLE 2 MG PO TABS
2.0000 mg | ORAL_TABLET | Freq: Every day | ORAL | Status: DC
  Administered 2013-07-19: 2 mg via ORAL
  Filled 2013-07-19 (×2): qty 1

## 2013-07-19 MED ORDER — BUPROPION HCL ER (SR) 100 MG PO TB12: 100.0000 mg | ORAL_TABLET | Freq: Every day | ORAL | Status: DC

## 2013-07-19 MED ORDER — ACETAMINOPHEN 325 MG PO TABS
650.0000 mg | ORAL_TABLET | Freq: Four times a day (QID) | ORAL | Status: DC
  Administered 2013-07-19 – 2013-07-20 (×6): 650 mg via ORAL
  Filled 2013-07-19 (×5): qty 2

## 2013-07-19 MED ORDER — BUPROPION HCL ER (SR) 100 MG PO TB12
100.0000 mg | ORAL_TABLET | Freq: Every day | ORAL | Status: DC
  Administered 2013-07-19 – 2013-07-20 (×2): 100 mg via ORAL
  Filled 2013-07-19 (×2): qty 1

## 2013-07-19 MED ORDER — ENSURE COMPLETE PO LIQD
237.0000 mL | Freq: Two times a day (BID) | ORAL | Status: DC
  Administered 2013-07-19: 237 mL via ORAL

## 2013-07-19 NOTE — Progress Notes (Signed)
Utilization review completed.  

## 2013-07-19 NOTE — Progress Notes (Signed)
[facility] PROGRESS NOTE  [name] SNK:[id number] DOB: 17-Aug-1927 DOA: 07/18/2013  PCP: Unice Cobble, MD  Brief HPI: [name] is an 80 y.o. female with a PMH of depression/anxiety, prior history of hyponatremia who presents with a 1-2 month history of nausea without vomiting, decreased oral intake, and weight loss. She saw Dr. Olevia Perches of gastroenterology on 07/10/13 who obtained a CT scan of the abdomen and pelvis on 07/17/13 with no significant pathology noted. The patient has also had symptoms suggestive of depression and her Lorazepam dosage was adjusted by her PCP on 07/03/13. She was subsequently started on Zoloft, which she feels has worsened the insomnia. She presented to the hospital secondary to generalized weakness and persistent nausea. Upon initial evaluation in the ED, the patient was noted to be hyponatremic with sodium of 122.  Past medical history:  Past Medical History  Diagnosis Date  . Dysmetabolic syndrome X   . Hyperlipidemia   . Other abnormal glucose   . Hypothyroidism   . ANEMIA-NOS 01/21/2009    Qualifier: Diagnosis of  By: Linna Darner MD, Gwyndolyn Saxon   Hematocrit has ranged from 31.1-32.8 with normochromic, normocytic indices. B12 level was 370 in July 2012.    Marland Kitchen DIVERTICULOSIS, COLON 05/21/2008    Qualifier: Diagnosis of  By: Linna Darner MD, Eda Paschal POLYPS, HX OF 01/21/2009    Qualifier: Diagnosis of  By: Linna Darner MD, Gwyndolyn Saxon    . FIBROMYALGIA 11/14/2006    Qualifier: Diagnosis of  By: Linna Darner MD, Gwyndolyn Saxon    . HYPERLIPIDEMIA 04/28/2007    Qualifier: Diagnosis of  By: Linna Darner MD, Cristopher Estimable 2012: LDL 139 ( 2348/1176), HDL 51, TG 228. LDL goal = < 100, ideally < 70. M uncle MI @ 31.   Marland Kitchen HYPERTENSION, ESSENTIAL NOS 04/28/2007    Qualifier: Diagnosis of  By: Linna Darner MD, Gwyndolyn Saxon    . HYPERTROPHIC CARDIOMYOPATHY 08/23/2009    Qualifier: Diagnosis of  By: Aundra Dubin, MD, [place]    . Hyponatremia 06/27/2013  . MYCOSIS FUNGOIDES 05/21/2008    Qualifier:  Diagnosis of  By: Linna Darner MD, Gwyndolyn Saxon   Dr Rolm Bookbinder, Derm    . OSTEOARTHRITIS 06/02/2007    Qualifier: Diagnosis of  By: Nils Pyle CMA ([place]), Mearl Latin    . OSTEOPOROSIS 04/28/2007    Qualifier: Diagnosis of  By: Linna Darner MD, Gwyndolyn Saxon    . RENAL INSUFFICIENCY 01/21/2009    Qualifier: Diagnosis of  By: Linna Darner MD, Gwyndolyn Saxon   02/16/11: BUN 25, creatinine 1.2, GFR 46.27  11/06/11: BUN 31, creatinine 1.4, GFR 38.29    . Unspecified vitamin D deficiency 01/21/2009    Qualifier: Diagnosis of  By: Linna Darner MD, Gwyndolyn Saxon    . Nodule of left lung 07/18/2013    Consultants: None  Procedures: None  Antibiotics: None  Subjective: Patient was able to eat her breakfast. Nausea is present but better. Denies any pain in abdomen.  Objective: Vital Signs  Filed Vitals:   07/18/13 1156 07/18/13 2016 07/18/13 2141 07/19/13 0525  BP: 146/70  103/54 122/70  Pulse: 65  67 61  Temp: 97.7 F (36.5 C)  97.4 F (36.3 C) 97.5 F (36.4 C)  TempSrc: Oral  Oral Oral  Resp: 18  16 16   Height:  4\' 9"  (1.448 m)    Weight:  67.8 kg (149 lb 7.6 oz)    SpO2: 96%  95% 95%    Intake/Output Summary (Last 24 hours) at 07/19/13 0840 Last data filed at 07/19/13 0600  Gross  per 24 hour  Intake 763.33 ml  Output    500 ml  Net 263.33 ml   Filed Weights   07/18/13 2016  Weight: 67.8 kg (149 lb 7.6 oz)    General appearance: alert, cooperative, appears stated age and no distress Resp: clear to auscultation bilaterally Cardio: regular rate and rhythm, S1, S2 normal, no murmur, click, rub or gallop GI: soft, non-tender; bowel sounds normal; no masses,  no organomegaly Extremities: extremities normal, atraumatic, no cyanosis or edema Neurologic: No focal deficits  Lab Results:  Basic Metabolic Panel:  Recent Labs Lab 07/18/13 0755 07/19/13 0530  NA 122* 128*  K 4.3 4.2  CL 85* 94*  CO2 23 23  GLUCOSE 112* 99  BUN 12 9  CREATININE 1.02 1.00  CALCIUM 9.4 8.7   CBC:  Recent Labs Lab 07/18/13 0800  WBC 5.5    HGB 11.0*  HCT 31.3*  MCV 82.2  PLT 253   Cardiac Enzymes:  Recent Labs Lab 07/18/13 0800  TROPONINI <0.30    Studies/Results: Ct Abdomen Pelvis W Contrast  07/17/2013   CLINICAL DATA:  10 lb weight loss and nausea for a few weeks. Abdominal discomfort beginning in late December. Decreased appetite. Hypertension.  EXAM: CT ABDOMEN AND PELVIS WITH CONTRAST  TECHNIQUE: Multidetector CT imaging of the abdomen and pelvis was performed using the standard protocol following bolus administration of intravenous contrast.  CONTRAST:  132mL OMNIPAQUE IOHEXOL 300 MG/ML  SOLN  COMPARISON:  None.  FINDINGS: Lower Chest: 5 mm left lower lobe lung nodule on image 6. Mild cardiomegaly. A small hiatal hernia. Trace bilateral pleural thickening.  Abdomen/Pelvis: No focal osseous lesion. Normal spleen, distal stomach. Moderate-sized periampullary duodenal diverticulum. Normal pancreas. A diverticulum is also identified at the duodenal jejunal junction on image 23.  Normal gallbladder, biliary tract, right adrenal gland. A left adrenal mass measures 3.4 x 3.8 cm on image 15 and has macroscopic fat within, most consistent with a myelolipoma.  An interpolar left renal lesion measures 2.3 cm and is also fat density, consistent with an angiomyolipoma. More ill-defined perirenal fat irregularity is likely related to extension of the capsular based angiomyolipoma.  Aortic and branch vessel atherosclerosis. No retroperitoneal or retrocrural adenopathy.  Extensive colonic diverticulosis with sigmoid muscular hypertrophy. Normal terminal ileum and appendix. Normal small bowel without abdominal ascites.  No pelvic adenopathy. Pelvic floor laxity which is moderate. Otherwise normal urinary bladder and uterus, without adnexal mass or significant free pelvic fluid.  Bones/Musculoskeletal: Advanced lower lumbar spondylosis with mild osteopenia.  IMPRESSION: 1.  No acute process in the abdomen or pelvis. 2. Left adrenal myelolipoma.  3. Left renal angiomyolipoma. 4. 5 mm left lower lobe lung nodule. If the patient is at high risk for bronchogenic carcinoma, follow-up chest CT at 6-12 months is recommended. If the patient is at low risk for bronchogenic carcinoma, follow-up chest CT at 12 months is recommended. This recommendation follows the consensus statement: "Guidelines for Management of Small Pulmonary Nodules Detected on CT Scans: A Statement from the [place]" as published in Radiology2005;237:395-400. Online at: https://www.arnold.com/.   Electronically Signed   By: Abigail Miyamoto M.D.   On: 07/17/2013 10:16    Medications:  Scheduled: . acetaminophen  650 mg Oral Q6H  . aspirin  81 mg Oral q morning - 10a  . cholecalciferol  2,000 Units Oral q morning - 10a  . enoxaparin (LOVENOX) injection  40 mg Subcutaneous Q24H  . levothyroxine  37.5 mcg Oral Once per day on  Tue Sat  . levothyroxine  75 mcg Oral Once per day on Sun Mon Wed Thu Fri  . lisinopril  20 mg Oral q morning - 10a  . metoCLOPramide  5 mg Oral TID AC  . multivitamin  1 tablet Oral q morning - 10a  . pantoprazole  40 mg Oral Q0600   Continuous: . sodium chloride 50 mL/hr at 07/19/13 0526   [id number] & mag hydroxide-simeth, LORazepam, ondansetron (ZOFRAN) IV, ondansetron  Assessment/Plan:  Principal Problem:   Hyponatremia Active Problems:   HYPOTHYROIDISM   Generalized weakness   Anxiety and depression   Nodule of left lung   Nausea alone   Normocytic anemia    Hyponatremia  Improved. Was low probably due to medication, SSRI. Poor oral intake could have contributed as well. Continue to hold Zoloft. Urine osm is low and so SIADH is unlikely. The patient has had problems with hyponatremia in the past with documented sodium levels as low as 126. TSH recently checked and was within normal limits. Cortisol is normal.  HYPOTHYROIDISM  Continue Synthroid. TSH 2.81 on 06/26/13.   Generalized weakness    Likely from hyponatremia. Physical therapy evaluation requested. Ambulate in hall with assistance.  Anxiety and depression  Psychiatry evaluation can be pursued as OP. Stop SSRI for now. Since this was started recently no need to taper it off.  Nodule of left lung  Consider followup CT in one year, the patient considered to be low risk with only a remote history of tobacco use.   Nausea alone  Etiology remains unclear. Recent CT failed to reveal a specific reason. Continue Zofran as needed. Continue empiric Reglan and Protonix. LFT's were normal recently. Can follow up with Dr. Olevia Perches as OP. May end up requiring endoscopy eventually.   Normocytic anemia  Iron 48, transferrin 272.4, B12 632 on prior testing.   Code Status: Full Code  DVT Prophylaxis: Lovenox    Family Communication: Discussed with patient and daughter  Disposition Plan: Anticipate discharge 2/16.    LOS: 1 day   [facility] Pager [phone] 07/19/2013, 8:40 AM  If 8PM-8AM, please contact night-coverage at [email or web address], password [facility]

## 2013-07-19 NOTE — Progress Notes (Signed)
INITIAL NUTRITION ASSESSMENT  DOCUMENTATION CODES Per approved criteria  -Obesity grade 1   INTERVENTION: Ensure Complete po BID, each supplement provides 350 kcal and 13 grams of protein Educated patient on good sources of protein, benefits of 3 meals plus snacks or supplements when intake is poor.  Ideas for oral nutrition supplements are Ensure which patient has tried and [employer].  Coupons for Ensure provided RD available as needed  NUTRITION DIAGNOSIS: Inadequate oral intake related to depression and nausea as evidenced by patient report.   Goal: Intake of meals and supplements to meet ?90% estimated needs.  Monitor:  Intake, labs, weight  Reason for Assessment: Consult in patient with chronic nausea and weight loss.  80 y.o. female  Admitting Dx: Hyponatremia  ASSESSMENT: Patient admitted with hyponatremia.  Patient states that she is depressed.  Has to force herself to eat.  Eats cereal in the morning, snacks such as cheese and crackers and maybe soup and sandwich for dinner.  Has had Ensure in the past.  Patient is worried about the weight loss and reports loss to 144 lbs but now increased to 149 lbs.  UBW 150 lbs.  Patient is worried about her poor appetite.  Depression contributing to poor appetite.  Patient's meds have been adjusted.    Nutrition Focused Physical Exam:  Subcutaneous Fat:  Orbital Region: wnl Upper Arm Region: wnl Thoracic and Lumbar Region: wnl  Muscle:  Temple Region: wnl Clavicle Bone Region: wnl Clavicle and Acromion Bone Region: wnl Scapular Bone Region: mild Dorsal Hand: wnl Patellar Region: wnl Anterior Thigh Region: wnl Posterior Calf Region: mild  Edema: none noted    Height: Ht Readings from Last 1 Encounters:  07/18/13 4\' 9"  (1.448 m)    Weight: Wt Readings from Last 1 Encounters:  07/18/13 149 lb 7.6 oz (67.8 kg)    Ideal Body Weight: 91 lbs  % Ideal Body Weight: 164  Wt Readings from Last 10  Encounters:  07/18/13 149 lb 7.6 oz (67.8 kg)  07/10/13 144 lb (65.318 kg)  07/03/13 145 lb 3.2 oz (65.862 kg)  06/26/13 149 lb 9.6 oz (67.858 kg)  09/09/12 155 lb (70.308 kg)  02/12/12 155 lb 3.2 oz (70.398 kg)  12/26/11 154 lb (69.854 kg)  11/27/11 156 lb 9.6 oz (71.033 kg)  07/03/11 157 lb 3.2 oz (71.305 kg)  12/27/10 153 lb 9.6 oz (69.673 kg)    Usual Body Weight: 150 lbs  % Usual Body Weight: 99  BMI:  Body mass index is 32.34 kg/(m^2).  Estimated Nutritional Needs: Kcal: 1200-1400 Protein: 50-60 gm Fluid: 1.7L  Skin: wnl  Diet Order: General  EDUCATION NEEDS: -No education needs identified at this time   Intake/Output Summary (Last 24 hours) at 07/19/13 1507 Last data filed at 07/19/13 1403  Gross per 24 hour  Intake   1630 ml  Output    500 ml  Net   1130 ml     Labs:   Recent Labs Lab 07/18/13 0755 07/19/13 0530  NA 122* 128*  K 4.3 4.2  CL 85* 94*  CO2 23 23  BUN 12 9  CREATININE 1.02 1.00  CALCIUM 9.4 8.7  GLUCOSE 112* 99    CBG (last 3)  No results found for this basename: GLUCAP,  in the last 72 hours  Scheduled Meds: . acetaminophen  650 mg Oral Q6H  . ARIPiprazole  2 mg Oral QHS  . aspirin  81 mg Oral q morning - 10a  . buPROPion  100 mg Oral Daily  . cholecalciferol  2,000 Units Oral q morning - 10a  . enoxaparin (LOVENOX) injection  40 mg Subcutaneous Q24H  . levothyroxine  37.5 mcg Oral Once per day on Tue Sat  . levothyroxine  75 mcg Oral Once per day on Sun Mon Wed Thu Fri  . lisinopril  20 mg Oral q morning - 10a  . metoCLOPramide  5 mg Oral TID AC  . multivitamin  1 tablet Oral q morning - 10a  . pantoprazole  40 mg Oral Q0600    Continuous Infusions: . sodium chloride 50 mL/hr at 07/19/13 5465    Past Medical History  Diagnosis Date  . Dysmetabolic syndrome X   . Hyperlipidemia   . Other abnormal glucose   . Hypothyroidism   . ANEMIA-NOS 01/21/2009    Qualifier: Diagnosis of  By: Linna Darner MD, Gwyndolyn Saxon   Hematocrit  has ranged from 31.1-32.8 with normochromic, normocytic indices. B12 level was 370 in July 2012.    Marland Kitchen DIVERTICULOSIS, COLON 05/21/2008    Qualifier: Diagnosis of  By: Linna Darner MD, Eda Paschal POLYPS, HX OF 01/21/2009    Qualifier: Diagnosis of  By: Linna Darner MD, Gwyndolyn Saxon    . FIBROMYALGIA 11/14/2006    Qualifier: Diagnosis of  By: Linna Darner MD, Gwyndolyn Saxon    . HYPERLIPIDEMIA 04/28/2007    Qualifier: Diagnosis of  By: Linna Darner MD, Cristopher Estimable 2012: LDL 139 ( 2348/1176), HDL 51, TG 228. LDL goal = < 100, ideally < 70. M uncle MI @ 63.   Marland Kitchen HYPERTENSION, ESSENTIAL NOS 04/28/2007    Qualifier: Diagnosis of  By: Linna Darner MD, Gwyndolyn Saxon    . HYPERTROPHIC CARDIOMYOPATHY 08/23/2009    Qualifier: Diagnosis of  By: Aundra Dubin, MD, [place]    . Hyponatremia 06/27/2013  . MYCOSIS FUNGOIDES 05/21/2008    Qualifier: Diagnosis of  By: Linna Darner MD, Gwyndolyn Saxon   Dr Rolm Bookbinder, Derm    . OSTEOARTHRITIS 06/02/2007    Qualifier: Diagnosis of  By: Nils Pyle CMA ([place]), Mearl Latin    . OSTEOPOROSIS 04/28/2007    Qualifier: Diagnosis of  By: Linna Darner MD, Gwyndolyn Saxon    . RENAL INSUFFICIENCY 01/21/2009    Qualifier: Diagnosis of  By: Linna Darner MD, Gwyndolyn Saxon   02/16/11: BUN 25, creatinine 1.2, GFR 46.27  11/06/11: BUN 31, creatinine 1.4, GFR 38.29    . Unspecified vitamin D deficiency 01/21/2009    Qualifier: Diagnosis of  By: Linna Darner MD, Gwyndolyn Saxon    . Nodule of left lung 07/18/2013    Past Surgical History  Procedure Laterality Date  . G 4 p 2    . Colonoscopy with polypectomy  07/25/2007    Dr Olevia Perches, Recheck in 7 years for 2009    Antonieta Iba, RD, LDN Clinical Inpatient Dietitian Pager:  [phone] Weekend and after hours pager:  [phone]

## 2013-07-19 NOTE — Progress Notes (Signed)
PT Cancellation Note  Patient Details Name: TENSLEY WERY MRN: [id number] DOB: 1927/07/21   Cancelled Treatment:    Reason Eval/Treat Not Completed: Patient declined, already walked w/ CNA/ Eval in AM.   Claretha Cooper 07/19/2013, 2:01 PM Tresa Endo PT [phone]

## 2013-07-19 NOTE — Consult Note (Signed)
Reason for Consult:depression and medication management Referring Physician: Dr. Tye Maryland [name] is an 80 y.o. female.  HPI: Patient was seen and chart reviewed. Patient daughter and granddaughter were at bedside. Patient reportedly suffering with the symptoms of depression and has been receiving medication Zoloft which he has a limited response and also found with hyponatremia. Patient medication might have a some contribution towards her low sodium levels and need to be changed. Patient and her family consent to start a new medication Wellbutrin SR 100 mg daily for depression and also Abilify 2 mg at bedtime as an add-on medication for depression and also improving sleep and appetite. Patient and her family expecting to be discharged tomorrow when she was able to tolerate her medication and has not reported adverse effects.   As per medical history: [name] is an 80 y.o. female with a PMH of depression/anxiety, prior history of hyponatremia who presents with a 1-2 month history of nausea without vomiting, decreased oral intake, and weight loss. Prior to this she had some issues with diarrhea. No history of hematochezia or melena. She had undergone colonoscopy in January 2009 with finding of hyperplastic polyps and diverticulosis and also had EGD at that same time with a mild acute gastritis. She saw Dr. Olevia Perches of gastroenterology on 07/10/13 who obtained a CT scan of the abdomen and pelvis on 07/17/13 with no significant pathology noted. The patient has also had problems with her mood including generalized anxiety, excessive worry, anhedonia, insomnia, and negativity. Her symptoms had been previously managed with Lorazepam, and the dosage was adjusted by her PCP on 07/03/13. She was subsequently started on Zoloft, which she feels has worsened the insomnia. She presented to the hospital today secondary to generalized weakness and persistent nausea. Upon initial evaluation in the ED, the patient  was noted to be hyponatremic with sodium of 122.    Review of Systems:  Constitutional: No fever, no chills; Appetite diminished; + weight loss of 10 lbs in 6 months, no weight gain, + fatigue. HEENT: No blurry vision, no diplopia, no pharyngitis, no dysphagia CV: No chest pain, no palpitations, no PND. Resp: No SOB, no cough, no pleuritic pain. GI: +nausea, no vomiting, + recent diarrhea, no melena, no hematochezia, no constipation. GU: No dysuria, no hematuria, no frequency, no urgency. MSK: + nocturnal myalgias with ? Restless legs, no arthralgias. Neuro: No headache, no focal neurological deficits, no history of seizures. Psych: + depression, + anxiety, +insomnia. Endo: No heat intolerance, + cold intolerance, no polyuria, no polydipsia Skin: No rashes, no skin lesions. Heme: + easy bruising. Travel history: None recent.   Mental Status Examination: Patient appeared as per his stated age and fairly groomed, and  has  good eye contact. Patient has  depressed and anxious  mood and  affect is appropriate and congruent .  she has normal rate, rhythm, and volume of speech.  her  thought process is linear and goal directed. Patient has denied suicidal, homicidal ideations, intentions or plans. Patient has no evidence of auditory or visual hallucinations, delusions, and paranoia. Patient has fair insight judgment and impulse control.  Past Medical History  Diagnosis Date  . Dysmetabolic syndrome X   . Hyperlipidemia   . Other abnormal glucose   . Hypothyroidism   . ANEMIA-NOS 01/21/2009    Qualifier: Diagnosis of  By: Linna Darner MD, Gwyndolyn Saxon   Hematocrit has ranged from 31.1-32.8 with normochromic, normocytic indices. B12 level was 370 in July 2012.    Marland Kitchen  DIVERTICULOSIS, COLON 05/21/2008    Qualifier: Diagnosis of  By: Linna Darner MD, Eda Paschal POLYPS, HX OF 01/21/2009    Qualifier: Diagnosis of  By: Linna Darner MD, Gwyndolyn Saxon    . FIBROMYALGIA 11/14/2006    Qualifier: Diagnosis of  By: Linna Darner MD, Gwyndolyn Saxon     . HYPERLIPIDEMIA 04/28/2007    Qualifier: Diagnosis of  By: Linna Darner MD, Cristopher Estimable 2012: LDL 139 ( 2348/1176), HDL 51, TG 228. LDL goal = < 100, ideally < 70. M uncle MI @ 28.   Marland Kitchen HYPERTENSION, ESSENTIAL NOS 04/28/2007    Qualifier: Diagnosis of  By: Linna Darner MD, Gwyndolyn Saxon    . HYPERTROPHIC CARDIOMYOPATHY 08/23/2009    Qualifier: Diagnosis of  By: Aundra Dubin, MD, [place]    . Hyponatremia 06/27/2013  . MYCOSIS FUNGOIDES 05/21/2008    Qualifier: Diagnosis of  By: Linna Darner MD, Gwyndolyn Saxon   Dr Rolm Bookbinder, Derm    . OSTEOARTHRITIS 06/02/2007    Qualifier: Diagnosis of  By: Nils Pyle CMA ([place]), Mearl Latin    . OSTEOPOROSIS 04/28/2007    Qualifier: Diagnosis of  By: Linna Darner MD, Gwyndolyn Saxon    . RENAL INSUFFICIENCY 01/21/2009    Qualifier: Diagnosis of  By: Linna Darner MD, Gwyndolyn Saxon   02/16/11: BUN 25, creatinine 1.2, GFR 46.27  11/06/11: BUN 31, creatinine 1.4, GFR 38.29    . Unspecified vitamin D deficiency 01/21/2009    Qualifier: Diagnosis of  By: Linna Darner MD, Gwyndolyn Saxon    . Nodule of left lung 07/18/2013    Past Surgical History  Procedure Laterality Date  . G 4 p 2    . Colonoscopy with polypectomy  07/25/2007    Dr Olevia Perches, Recheck in 7 years for 2009    Family History  Problem Relation Age of Onset  . Diabetes Mother   . Heart attack Maternal Uncle 12    his 2 sons had MI in 35s  . Hypertension Maternal Grandmother   . Stroke Maternal Grandmother 69  . Cancer Neg Hx     Social History:  reports that she quit smoking about 54 years ago. She does not have any smokeless tobacco history on file. She reports that she does not drink alcohol or use illicit drugs.  Allergies:  Allergies  Allergen Reactions  . Achromycin [Tetracycline Hcl]     Facial swelling   . Omnipen [Ampicillin]     Facial swelling   . Simvastatin Other (See Comments)    12/15/12 myalgias  . Celecoxib Other (See Comments)    REACTION: bleeding  . Ezetimibe-Simvastatin Diarrhea, Nausea And Vomiting and Other (See Comments)    REACTION:  myalgia    Medications: I have reviewed the patient's current medications.  Results for orders placed during the hospital encounter of 07/18/13 (from the past 48 hour(s))  BASIC METABOLIC PANEL     Status: Abnormal   Collection Time    07/18/13  7:55 AM      Result Value Ref Range   Sodium 122 (*) 137 - 147 mEq/L   Potassium 4.3  3.7 - 5.3 mEq/L   Chloride 85 (*) 96 - 112 mEq/L   CO2 23  19 - 32 mEq/L   Glucose, Bld 112 (*) 70 - 99 mg/dL   BUN 12  6 - 23 mg/dL   Creatinine, Ser 1.02  0.50 - 1.10 mg/dL   Calcium 9.4  8.4 - 10.5 mg/dL   GFR calc non Af Amer 49 (*) >90 mL/min   GFR calc Af Amer 57 (*) >  90 mL/min   Comment: (NOTE)     The eGFR has been calculated using the CKD EPI equation.     This calculation has not been validated in all clinical situations.     eGFR's persistently <90 mL/min signify possible Chronic Kidney     Disease.  OSMOLALITY     Status: Abnormal   Collection Time    07/18/13  7:55 AM      Result Value Ref Range   Osmolality 250 (*) 275 - 300 mOsm/kg   Comment: Performed at [facility]     Status: None   Collection Time    07/18/13  7:55 AM      Result Value Ref Range   Cortisol, Plasma 22.9     Comment: (NOTE)     AM:  4.3 - 22.4 ug/dL     PM:  3.1 - 16.7 ug/dL     Performed at [facility]  CBC     Status: Abnormal   Collection Time    07/18/13  8:00 AM      Result Value Ref Range   WBC 5.5  4.0 - 10.5 K/uL   RBC 3.81 (*) 3.87 - 5.11 MIL/uL   Hemoglobin 11.0 (*) 12.0 - 15.0 g/dL   HCT 31.3 (*) 36.0 - 46.0 %   MCV 82.2  78.0 - 100.0 fL   MCH 28.9  26.0 - 34.0 pg   MCHC 35.1  30.0 - 36.0 g/dL   RDW 13.9  11.5 - 15.5 %   Platelets 253  150 - 400 K/uL  TROPONIN I     Status: None   Collection Time    07/18/13  8:00 AM      Result Value Ref Range   Troponin I <0.30  <0.30 ng/mL   Comment:            Due to the release kinetics of cTnI,     a negative result within the first hours     of the onset of symptoms  does not rule out     myocardial infarction with certainty.     If myocardial infarction is still suspected,     repeat the test at appropriate intervals.  URINALYSIS, ROUTINE W REFLEX MICROSCOPIC     Status: Abnormal   Collection Time    07/18/13 10:44 AM      Result Value Ref Range   Color, Urine YELLOW  YELLOW   APPearance CLEAR  CLEAR   Specific Gravity, Urine 1.008  1.005 - 1.030   pH 7.0  5.0 - 8.0   Glucose, UA NEGATIVE  NEGATIVE mg/dL   Hgb urine dipstick NEGATIVE  NEGATIVE   Bilirubin Urine NEGATIVE  NEGATIVE   Ketones, ur NEGATIVE  NEGATIVE mg/dL   Protein, ur NEGATIVE  NEGATIVE mg/dL   Urobilinogen, UA 0.2  0.0 - 1.0 mg/dL   Nitrite NEGATIVE  NEGATIVE   Leukocytes, UA TRACE (*) NEGATIVE  URINE MICROSCOPIC-ADD ON     Status: None   Collection Time    07/18/13 10:44 AM      Result Value Ref Range   Squamous Epithelial / LPF RARE  RARE   WBC, UA 0-2  <3 WBC/hpf  SODIUM, URINE, RANDOM     Status: None   Collection Time    07/18/13  8:11 PM      Result Value Ref Range   Sodium, Ur 24     Comment: Performed at [facility]  OSMOLALITY, URINE     Status: Abnormal   Collection Time    07/18/13  8:11 PM      Result Value Ref Range   Osmolality, Ur 133 (*) 390 - 1090 mOsm/kg   Comment: Performed at [facility]     Status: Abnormal   Collection Time    07/19/13  5:30 AM      Result Value Ref Range   Sodium 128 (*) 137 - 147 mEq/L   Potassium 4.2  3.7 - 5.3 mEq/L   Chloride 94 (*) 96 - 112 mEq/L   Comment: DELTA CHECK NOTED   CO2 23  19 - 32 mEq/L   Glucose, Bld 99  70 - 99 mg/dL   BUN 9  6 - 23 mg/dL   Creatinine, Ser 1.00  0.50 - 1.10 mg/dL   Calcium 8.7  8.4 - 10.5 mg/dL   GFR calc non Af Amer 51 (*) >90 mL/min   GFR calc Af Amer 59 (*) >90 mL/min   Comment: (NOTE)     The eGFR has been calculated using the CKD EPI equation.     This calculation has not been validated in all clinical situations.     eGFR's persistently  <90 mL/min signify possible Chronic Kidney     Disease.    No results found.  Positive for anxiety, bad mood, depression and sleep disturbance Blood pressure 147/75, pulse 61, temperature 97.5 F (36.4 C), temperature source Oral, resp. rate 16, height '4\' 9"'  (1.448 m), weight 67.8 kg (149 lb 7.6 oz), SpO2 95.00%.   Assessment/Plan: Major depressive disorder, recurrent  Recommendation: Patient does not meet criteria for acute acute psychiatric hospitalization  She will be referred to the outpatient psychiatric services and appropriate  Start Wellbutrin SR 100 mg PO Qam for depression Start Abilify 2 mg PO Qhs for depression as a add on  Discontinue zoloft due to hyponatremia Monitor for adverse effects Appreciate psych consult and call [phone] if needs further assistance  [name],Amber R. 07/19/2013, 1:56 PM

## 2013-07-20 DIAGNOSIS — E871 Hypo-osmolality and hyponatremia: Secondary | ICD-10-CM | POA: Diagnosis not present

## 2013-07-20 LAB — BASIC METABOLIC PANEL
BUN: 9 mg/dL (ref 6–23)
CO2: 23 mEq/L (ref 19–32)
Calcium: 8.5 mg/dL (ref 8.4–10.5)
Chloride: 98 mEq/L (ref 96–112)
Creatinine, Ser: 0.91 mg/dL (ref 0.50–1.10)
GFR calc Af Amer: 66 mL/min — ABNORMAL LOW (ref >90)
GFR calc non Af Amer: 57 mL/min — ABNORMAL LOW (ref >90)
Glucose, Bld: 117 mg/dL — ABNORMAL HIGH (ref 70–99)
Potassium: 4.5 mEq/L (ref 3.7–5.3)
Sodium: 133 mEq/L — ABNORMAL LOW (ref 137–147)

## 2013-07-20 MED ORDER — METOCLOPRAMIDE HCL 5 MG PO TABS: 5.0000 mg | ORAL_TABLET | Freq: Three times a day (TID) | ORAL | Status: DC

## 2013-07-20 MED ORDER — BUPROPION HCL ER (SR) 100 MG PO TB12: 100.0000 mg | ORAL_TABLET | Freq: Every day | ORAL | Status: DC

## 2013-07-20 MED ORDER — ONDANSETRON HCL 4 MG PO TABS: 4.0000 mg | ORAL_TABLET | Freq: Two times a day (BID) | ORAL | Status: DC | PRN

## 2013-07-20 MED ORDER — ESOMEPRAZOLE MAGNESIUM 20 MG PO CPDR: 20.0000 mg | DELAYED_RELEASE_CAPSULE | Freq: Every day | ORAL | Status: DC

## 2013-07-20 NOTE — Progress Notes (Signed)
PT Cancellation Note  Patient Details Name: RYIAH BELLISSIMO MRN: [id number] DOB: 14-Jan-1928   Cancelled Treatment:    Reason Eval/Treat Not Completed: PT screened, no needs identified, will sign off   Claretha Cooper 07/20/2013, 11:06 AM

## 2013-07-20 NOTE — Discharge Summary (Signed)
[facility]  Physician Discharge Summary   Patient ID: [name] MRN: [id number] DOB/AGE: 1927-07-15 80 y.o.  Admit date: 07/18/2013 Discharge date: 07/20/2013  PCP: Unice Cobble, MD  DISCHARGE DIAGNOSES:  Principal Problem:   Hyponatremia Active Problems:   HYPOTHYROIDISM   Generalized weakness   Anxiety and depression   Nodule of left lung   Nausea alone   Normocytic anemia   RECOMMENDATIONS FOR OUTPATIENT FOLLOW UP: 1. Patient needs close follow up for depression 2. Please address need for Lasix in view of hyponatremia 3. Needs follow up with Dr. Nichola Sizer office for nausea. 4. PCP to follow up for lung nodule detected on CT  DISCHARGE CONDITION: fair  Diet recommendation: Low Salt  Filed Weights   07/18/13 2016  Weight: 67.8 kg (149 lb 7.6 oz)    INITIAL HISTORY: [name] is an 80 y.o. female with a PMH of depression/anxiety, prior history of hyponatremia who presents with a 1-2 month history of nausea without vomiting, decreased oral intake, and weight loss. She saw Dr. Olevia Perches of gastroenterology on 07/10/13 who obtained a CT scan of the abdomen and pelvis on 07/17/13 with no significant pathology noted. The patient has also had symptoms suggestive of depression and her Lorazepam dosage was adjusted by her PCP on 07/03/13. She was subsequently started on Zoloft, which she feels has worsened the insomnia. She presented to the hospital secondary to generalized weakness and persistent nausea. Upon initial evaluation in the ED, the patient was noted to be hyponatremic with sodium of 122.  Consultations:  Psychiatry  Procedures:  None  HOSPITAL COURSE:   Hyponatremia  This was low probably due to medications: SSRI and Lasix. Poor oral intake could have contributed as well. Will stop Zoloft and Laisx for now. PCP to re-evaluate need for Lasix. Alternative anti-depressant ordered as below. Urine osm was low and so SIADH was thought to be unlikely.  The patient has had problems with hyponatremia in the past with documented sodium levels as low as 126. TSH recently checked and was within normal limits. Cortisol is normal. Please recheck as OP as necessary clinically.  HYPOTHYROIDISM  Continue Synthroid. TSH 2.81 on 06/26/13.   Generalized weakness  Likely from hyponatremia. Has improved with increasing sodium levels. Physical therapy evaluation requested but patient was able to ambulate without assistance and so they did not see her.   Anxiety and depression  Psychiatry evaluation was requested as inpatient by admitting provider. She was changed over to Wellbutrin and was also given Abilify at nighttime. But patient could not sleep after taking Abilify and does not want to take it any more. Will stop. Will continue Wellbutrin and PCP can address this further.   Nodule of left lung  Consider followup CT in one year, the patient considered to be low risk with only a remote history of tobacco use.   Nausea alone  Etiology remains unclear. Recent CT failed to reveal a specific reason. Continue Zofran as needed. She was initiated on Reglan before meals. She has been able to tolerate her diet well without nausea. But have explained to patient and daughter that Reglan is not a good long term medications for nausea of unknown etiology. She was asked to follow up with Dr. Olevia Perches next week. She may end up requiring endoscopy eventually. PPI will be continued as well.  Normocytic anemia  Iron 48, transferrin 272.4, B12 632 on prior testing. Hgb remained stable.  Patient feels better. She wants to go home. She is  stable for discharge.   PERTINENT LABS:  The results of significant diagnostics from this hospitalization (including imaging, microbiology, ancillary and laboratory) are listed below for reference.     Labs: Basic Metabolic Panel:  Recent Labs Lab 07/18/13 0755 07/19/13 0530 07/20/13 0525  NA 122* 128* 133*  K 4.3 4.2 4.5  CL  85* 94* 98  CO2 23 23 23   GLUCOSE 112* 99 117*  BUN 12 9 9   CREATININE 1.02 1.00 0.91  CALCIUM 9.4 8.7 8.5   CBC:  Recent Labs Lab 07/18/13 0800  WBC 5.5  HGB 11.0*  HCT 31.3*  MCV 82.2  PLT 253   Cardiac Enzymes:  Recent Labs Lab 07/18/13 0800  TROPONINI <0.30    IMAGING STUDIES Ct Abdomen Pelvis W Contrast  07/17/2013   CLINICAL DATA:  10 lb weight loss and nausea for a few weeks. Abdominal discomfort beginning in late December. Decreased appetite. Hypertension.  EXAM: CT ABDOMEN AND PELVIS WITH CONTRAST  TECHNIQUE: Multidetector CT imaging of the abdomen and pelvis was performed using the standard protocol following bolus administration of intravenous contrast.  CONTRAST:  138mL OMNIPAQUE IOHEXOL 300 MG/ML  SOLN  COMPARISON:  None.  FINDINGS: Lower Chest: 5 mm left lower lobe lung nodule on image 6. Mild cardiomegaly. A small hiatal hernia. Trace bilateral pleural thickening.  Abdomen/Pelvis: No focal osseous lesion. Normal spleen, distal stomach. Moderate-sized periampullary duodenal diverticulum. Normal pancreas. A diverticulum is also identified at the duodenal jejunal junction on image 23.  Normal gallbladder, biliary tract, right adrenal gland. A left adrenal mass measures 3.4 x 3.8 cm on image 15 and has macroscopic fat within, most consistent with a myelolipoma.  An interpolar left renal lesion measures 2.3 cm and is also fat density, consistent with an angiomyolipoma. More ill-defined perirenal fat irregularity is likely related to extension of the capsular based angiomyolipoma.  Aortic and branch vessel atherosclerosis. No retroperitoneal or retrocrural adenopathy.  Extensive colonic diverticulosis with sigmoid muscular hypertrophy. Normal terminal ileum and appendix. Normal small bowel without abdominal ascites.  No pelvic adenopathy. Pelvic floor laxity which is moderate. Otherwise normal urinary bladder and uterus, without adnexal mass or significant free pelvic fluid.   Bones/Musculoskeletal: Advanced lower lumbar spondylosis with mild osteopenia.  IMPRESSION: 1.  No acute process in the abdomen or pelvis. 2. 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 4. 5 mm left lower lobe lung nodule. If the patient is at high risk for bronchogenic carcinoma, follow-up chest CT at 6-12 months is recommended. If the patient is at low risk for bronchogenic carcinoma, follow-up chest CT at 12 months is recommended. This recommendation follows the consensus statement: "Guidelines for Management of Small Pulmonary Nodules Detected on CT Scans: A Statement from the [place]" as published in Radiology2005;237:395-400. Online at: https://www.arnold.com/.   Electronically Signed   By: Abigail Miyamoto M.D.   On: 07/17/2013 10:16    DISCHARGE EXAMINATION: Filed Vitals:   07/19/13 1002 07/19/13 1402 07/19/13 2136 07/20/13 0612  BP: 147/75 134/65 130/57 146/80  Pulse:  68 68 68  Temp:  97.9 F (36.6 C) 98.1 F (36.7 C) 97.6 F (36.4 C)  TempSrc:  Oral Oral Oral  Resp:  16 18 18   Height:      Weight:      SpO2:  97% 97% 97%   General appearance: alert, cooperative, appears stated age and no distress Resp: clear to auscultation bilaterally Cardio: regular rate and rhythm, S1, S2 normal, no murmur, click, rub or gallop GI: soft,  non-tender; bowel sounds normal; no masses,  no organomegaly Neurologic: Alert and oriented X 3, normal strength and tone. Normal symmetric reflexes. Normal coordination and gait  DISPOSITION: Home with daughter  Discharge Orders   Future Orders Complete By Expires   Diet - low sodium heart healthy  As directed    Discharge instructions  As directed    Comments:     Please follow up with Dr. Olevia Perches or her PA for further evaluation of nausea. Please follow with Dr. Linna Darner for symptoms of depression and to discuss if you need to continue taking Lasix.   Increase activity slowly  As directed        ALLERGIES:  Allergies  Allergen Reactions  . Achromycin [Tetracycline Hcl]     Facial swelling   . Omnipen [Ampicillin]     Facial swelling   . Simvastatin Other (See Comments)    12/15/12 myalgias  . Celecoxib Other (See Comments)    REACTION: bleeding  . Ezetimibe-Simvastatin Diarrhea, Nausea And Vomiting and Other (See Comments)    REACTION: myalgia    Current Discharge Medication List    START taking these medications   Details  buPROPion (WELLBUTRIN SR) 100 MG 12 hr tablet Take 1 tablet (100 mg total) by mouth daily. Qty: 30 tablet, Refills: 0    esomeprazole (NEXIUM) 20 MG capsule Take 1 capsule (20 mg total) by mouth daily at 12 noon. Qty: 30 capsule, Refills: 0    metoCLOPramide (REGLAN) 5 MG tablet Take 1 tablet (5 mg total) by mouth 3 (three) times daily before meals. Qty: 90 tablet, Refills: 0      CONTINUE these medications which have CHANGED   Details  ondansetron (ZOFRAN) 4 MG tablet Take 1 tablet (4 mg total) by mouth 2 (two) times daily as needed for nausea or vomiting. Qty: 20 tablet, Refills: 0      CONTINUE these medications which have NOT CHANGED   Details  acetaminophen (TYLENOL) 325 MG tablet Take 650 mg by mouth 3 (three) times daily.    aspirin 81 MG chewable tablet Chew 81 mg by mouth every morning.    beta carotene w/minerals (OCUVITE) tablet Take 1 tablet by mouth every morning.    cholecalciferol (VITAMIN D) 1000 UNITS tablet Take 2,000 Units by mouth every morning.    levothyroxine (SYNTHROID, LEVOTHROID) 75 MCG tablet Take 37.5-75 mcg by mouth daily before breakfast. Take half of a tablet on Tuesdays and Saturdays. On all other days take 1 tablet    lisinopril (PRINIVIL,ZESTRIL) 20 MG tablet Take 20 mg by mouth every morning.     LORazepam (ATIVAN) 0.5 MG tablet Take 0.5 mg by mouth every 8 (eight) hours as needed for anxiety.      STOP taking these medications     furosemide (LASIX) 40 MG tablet      sertraline (ZOLOFT) 25 MG  tablet        Follow-up Information   Follow up with Unice Cobble, MD. Schedule an appointment as soon as possible for a visit in 3 days. (to discuss depression and to see if Lasix is necessary to take in view of low sodium levels)    Specialty:  Internal Medicine   Contact information:   [address] [place] [address] [phone]       Follow up with Delfin Edis, MD. Schedule an appointment as soon as possible for a visit in 1 week. (for nausea)    Specialty:  Gastroenterology   Contact information:  [address] [place] [address] [phone]       TOTAL DISCHARGE TIME: 46 mins  [facility] Pager [phone]  07/20/2013, 7:51 AM

## 2013-07-22 ENCOUNTER — Encounter: Payer: Self-pay | Admitting: Internal Medicine

## 2013-07-24 ENCOUNTER — Encounter: Payer: Self-pay | Admitting: *Deleted

## 2013-07-27 ENCOUNTER — Encounter: Payer: Self-pay | Admitting: Internal Medicine

## 2013-07-27 ENCOUNTER — Other Ambulatory Visit (INDEPENDENT_AMBULATORY_CARE_PROVIDER_SITE_OTHER): Payer: Medicare Other

## 2013-07-27 ENCOUNTER — Ambulatory Visit (INDEPENDENT_AMBULATORY_CARE_PROVIDER_SITE_OTHER): Payer: Medicare Other | Admitting: Internal Medicine

## 2013-07-27 VITALS — BP 150/62 | HR 80 | Temp 97.1°F | Wt 143.1 lb

## 2013-07-27 DIAGNOSIS — E871 Hypo-osmolality and hyponatremia: Secondary | ICD-10-CM | POA: Diagnosis not present

## 2013-07-27 DIAGNOSIS — F3289 Other specified depressive episodes: Secondary | ICD-10-CM | POA: Diagnosis not present

## 2013-07-27 DIAGNOSIS — N259 Disorder resulting from impaired renal tubular function, unspecified: Secondary | ICD-10-CM

## 2013-07-27 DIAGNOSIS — R911 Solitary pulmonary nodule: Secondary | ICD-10-CM

## 2013-07-27 DIAGNOSIS — F329 Major depressive disorder, single episode, unspecified: Secondary | ICD-10-CM | POA: Insufficient documentation

## 2013-07-27 DIAGNOSIS — F32A Depression, unspecified: Secondary | ICD-10-CM | POA: Insufficient documentation

## 2013-07-27 LAB — BASIC METABOLIC PANEL
BUN: 16 mg/dL (ref 6–23)
CO2: 25 meq/L (ref 19–32)
CREATININE: 1 mg/dL (ref 0.4–1.2)
Calcium: 9.2 mg/dL (ref 8.4–10.5)
Chloride: 99 mEq/L (ref 96–112)
GFR: 54.33 mL/min — ABNORMAL LOW (ref >60.00)
GLUCOSE: 92 mg/dL (ref 70–99)
Potassium: 4.3 mEq/L (ref 3.5–5.1)
Sodium: 131 mEq/L — ABNORMAL LOW (ref 135–145)

## 2013-07-27 NOTE — Assessment & Plan Note (Signed)
CT scan of the lung in February 2016. Concept of  doubling time and risk discussed

## 2013-07-27 NOTE — Assessment & Plan Note (Signed)
BMET 

## 2013-07-27 NOTE — Progress Notes (Signed)
Pre visit review using our clinic review tool, if applicable. No additional management support is needed unless otherwise documented below in the visit note. 

## 2013-07-27 NOTE — Progress Notes (Signed)
   Subjective:    Patient ID: [name], female    DOB: [birth year], 80 y.o.   MRN: [id number]  [facility] records 2/14-2/16/15 were reviewed. The sertraline was discontinued as there were concerns that might be contributing to her hyponatremia.  She was prescribed generic Wellbutrin as well as Abilify. The latter may have caused insomnia and was discontinued.  An incidental finding was a pulmonary nodule. She was at a low risk. Followup CT in one year was recommended.    Review of Systems  She denies significant nausea, vomiting,dyspepsia, dysphagia, unexplained weight loss, abdominal pain, melena, rectal bleeding, or small caliber stools.  She has no cough, sputum production, or dyspnea except with significant walking.     Objective:   Physical Exam General appearance is one of good health and nourishment w/o distress.  Eyes: No conjunctival inflammation or scleral icterus is present. Ptosis  Oral exam: Dental hygiene is good; lips and gums are healthy appearing.There is no oropharyngeal erythema or exudate noted.   Heart:  Normal rate and regular rhythm. S1 and S2 normal without gallop, murmur, click, rub or other extra sounds  . Occasional premature  Trace edema; good pedal pulses   Lungs:Chest clear to auscultation; no wheezes, rhonchi,rales ,or rubs present.No increased work of breathing.   Abdomen: bowel sounds normal, soft and non-tender without masses, organomegaly or hernias noted.  No guarding or rebound . No tenderness over the flanks to percussion  Musculoskeletal: Able to lie flat and sit up with minimal help.  Skin:Warm & dry.  Intact without suspicious lesions or rashes ; no jaundice. Slight tenting  Lymphatic: No lymphadenopathy is noted about the head, neck, axilla              Assessment & Plan:  See Current Assessment & Plan in Problem List under specific Diagnosis

## 2013-07-27 NOTE — Patient Instructions (Signed)
To prevent sleep dysfunction follow these instructions for sleep hygiene. Do not read, watch TV, or eat in bed. Do not get into bed until you are ready to turn off the light &  to go to sleep. Do not ingest stimulants ( decongestants, diet pills, nicotine, caffeine) after the evening meal.Do not take daytime naps.Cardiovascular exercise, this can be as simple a program as walking, is recommended 10-15 minutes 3-4 times per week. If you're not exercising you should take 6-8 weeks to build up to this level.

## 2013-07-28 ENCOUNTER — Other Ambulatory Visit: Payer: Self-pay | Admitting: Internal Medicine

## 2013-07-28 DIAGNOSIS — E871 Hypo-osmolality and hyponatremia: Secondary | ICD-10-CM

## 2013-08-16 ENCOUNTER — Encounter: Payer: Self-pay | Admitting: Internal Medicine

## 2013-08-18 MED ORDER — BUPROPION HCL ER (SR) 100 MG PO TB12: 100.0000 mg | ORAL_TABLET | Freq: Every day | ORAL | Status: DC

## 2013-08-18 NOTE — Telephone Encounter (Signed)
Rx sent to the pharmacy by e-script.//AB/CMA 

## 2013-08-20 ENCOUNTER — Encounter: Payer: Self-pay | Admitting: Internal Medicine

## 2013-08-20 ENCOUNTER — Ambulatory Visit (INDEPENDENT_AMBULATORY_CARE_PROVIDER_SITE_OTHER): Payer: Medicare Other | Admitting: Internal Medicine

## 2013-08-20 VITALS — BP 138/62 | HR 68 | Ht <= 58 in | Wt 142.2 lb

## 2013-08-20 DIAGNOSIS — R634 Abnormal weight loss: Secondary | ICD-10-CM

## 2013-08-20 DIAGNOSIS — R112 Nausea with vomiting, unspecified: Secondary | ICD-10-CM | POA: Diagnosis not present

## 2013-08-20 NOTE — Patient Instructions (Addendum)
You have been scheduled for an endoscopy with propofol. Please follow written instructions given to you at your visit today. If you use inhalers (even only as needed), please bring them with you on the day of your procedure. Your physician has requested that you go to www.startemmi.com and enter the access code given to you at your visit today. This web site gives a general overview about your procedure. However, you should still follow specific instructions given to you by our office regarding your preparation for the procedure.  CC: Dr Linna Darner

## 2013-08-20 NOTE — Progress Notes (Signed)
[name] 1928/04/16 [id number]  Note: This dictation was prepared with Dragon digital system. Any transcriptional errors that result from this procedure are unintentional.   History of Present Illness:  This is an 80 year old white female with nausea and gradual mild weight loss from 153 pounds in July 2012 to 144 pounds last month when she saw Amy Esterwood, PA-C to 142 pounds today. She comes with her daughter. She denies abdominal pain but does note decreased appetite. She was started on Wellbutrin 100 mg daily for depression. Her last upper endoscopy in January 2009 showed gastritis. She had a colonoscopy at the same time with removal of a hyperplastic polyp. She had mild diverticulosis of the time. A CT scan of the abdomen in February 2015 showed no acute process. Her daughter feels that the nausea is functional becausewhen she gets distracted when she is with her grandchildren the nausea does not seem to be a problem.. There has been no vomiting.    Past Medical History  Diagnosis Date  . Dysmetabolic syndrome X   . Hyperlipidemia   . Other abnormal glucose   . Hypothyroidism   . DIVERTICULOSIS, COLON   . Hyperplastic colon polyp   . FIBROMYALGIA   . HYPERLIPIDEMIA   . HYPERTENSION, ESSENTIAL NOS   . HYPERTROPHIC CARDIOMYOPATHY   . Hyponatremia   . MYCOSIS FUNGOIDES   . OSTEOARTHRITIS   . OSTEOPOROSIS   . RENAL INSUFFICIENCY   . Unspecified vitamin D deficiency   . Nodule of left lung   . Normocytic anemia   . Adrenal myelolipoma   . Renal angiomyolipoma     Past Surgical History  Procedure Laterality Date  . G 4 p 2    . Colonoscopy with polypectomy  07/25/2007    Dr Olevia Perches, Recheck in 7 years for 2009    Allergies  Allergen Reactions  . Achromycin [Tetracycline Hcl]     Facial swelling   . Omnipen [Ampicillin]     Facial swelling   . Simvastatin Other (See Comments)    12/15/12 myalgias  . Celecoxib Other (See Comments)    REACTION: bleeding  .  Ezetimibe-Simvastatin Diarrhea, Nausea And Vomiting and Other (See Comments)    REACTION: myalgia  . Abilify [Aripiprazole]     2/15 prescribed by psychiatry for depression along with Wellbutrin. This caused insomnia    Family history and social history have been reviewed.  Review of Systems: Negative for dysphagia fever or rectal bleeding  The remainder of the 10 point ROS is negative except as outlined in the H&P  Physical Exam: General Appearance Well developed, in no distress Eyes  Non icteric  HEENT  Non traumatic, normocephalic  Mouth No lesion, tongue papillated, no cheilosis Neck Supple without adenopathy, thyroid not enlarged, no carotid bruits, no JVD Lungs Clear to auscultation bilaterally COR Normal S1, normal S2, regular rhythm, no murmur, quiet precordium Abdomen protuberant abdomen. Soft with normoactive bowel sounds. No fluid wave no tympany. Liver edge at costal margin Rectal not done Extremities  No pedal edema Skin No lesions Neurological Alert and oriented x 3 Psychological Normal mood and affect  Assessment and Plan:   Problem #1 Gradual weight loss which may be related to depression. Her daughter is worried about her poor appetite. She has a history of gastritis. We have discussed the pros and cons of upper endoscopy. We will go ahead with upper endoscopy to rule out H. pylori gastropathy, peptic ulcer disease or structural abnormality of the stomach. She will continue  Wellbutrin and Zofran as needed.    Delfin Edis 08/20/2013

## 2013-08-21 ENCOUNTER — Encounter: Payer: Self-pay | Admitting: Internal Medicine

## 2013-09-08 ENCOUNTER — Other Ambulatory Visit: Payer: Self-pay | Admitting: Internal Medicine

## 2013-09-08 NOTE — Telephone Encounter (Signed)
Rx sent to the pharmacy by e-script.//AB/CMA 

## 2013-09-11 DIAGNOSIS — D692 Other nonthrombocytopenic purpura: Secondary | ICD-10-CM | POA: Diagnosis not present

## 2013-09-11 DIAGNOSIS — Z85828 Personal history of other malignant neoplasm of skin: Secondary | ICD-10-CM | POA: Diagnosis not present

## 2013-09-11 DIAGNOSIS — L821 Other seborrheic keratosis: Secondary | ICD-10-CM | POA: Diagnosis not present

## 2013-09-11 DIAGNOSIS — L819 Disorder of pigmentation, unspecified: Secondary | ICD-10-CM | POA: Diagnosis not present

## 2013-09-11 DIAGNOSIS — L570 Actinic keratosis: Secondary | ICD-10-CM | POA: Diagnosis not present

## 2013-09-25 DIAGNOSIS — H524 Presbyopia: Secondary | ICD-10-CM | POA: Diagnosis not present

## 2013-09-25 DIAGNOSIS — H353190 Nonexudative age-related macular degeneration, unspecified eye, stage unspecified: Secondary | ICD-10-CM | POA: Diagnosis not present

## 2013-09-25 DIAGNOSIS — H43399 Other vitreous opacities, unspecified eye: Secondary | ICD-10-CM | POA: Diagnosis not present

## 2013-09-25 DIAGNOSIS — H2510 Age-related nuclear cataract, unspecified eye: Secondary | ICD-10-CM | POA: Diagnosis not present

## 2013-09-30 ENCOUNTER — Telehealth: Payer: Self-pay | Admitting: *Deleted

## 2013-09-30 NOTE — Telephone Encounter (Signed)
Pt called states she was advised to call PCP to request whether Eye Caps oral medication is compatible with other medications.  Please advise

## 2013-09-30 NOTE — Telephone Encounter (Signed)
Left detailed message on VM.

## 2013-09-30 NOTE — Telephone Encounter (Signed)
OK but do not take with thyroid supplement

## 2013-10-06 ENCOUNTER — Telehealth: Payer: Self-pay | Admitting: *Deleted

## 2013-10-06 NOTE — Telephone Encounter (Signed)
Pt called requesting whether she can take Ocuvite with her other medications.  Please advise

## 2013-10-06 NOTE — Telephone Encounter (Signed)
Left detailed message of MDs response

## 2013-10-06 NOTE — Telephone Encounter (Signed)
I reviewed Med List ; I see no contraindication

## 2013-10-12 ENCOUNTER — Encounter: Payer: Self-pay | Admitting: Internal Medicine

## 2013-10-18 ENCOUNTER — Other Ambulatory Visit: Payer: Self-pay | Admitting: Internal Medicine

## 2013-10-19 NOTE — Telephone Encounter (Signed)
Last ov 07/27/13

## 2013-10-19 NOTE — Telephone Encounter (Signed)
OK #30 

## 2013-10-23 ENCOUNTER — Encounter: Payer: Medicare Other | Admitting: Internal Medicine

## 2013-10-30 ENCOUNTER — Ambulatory Visit (AMBULATORY_SURGERY_CENTER): Payer: Medicare Other | Admitting: Internal Medicine

## 2013-10-30 ENCOUNTER — Encounter: Payer: Self-pay | Admitting: Internal Medicine

## 2013-10-30 VITALS — BP 138/63 | HR 59 | Temp 96.2°F | Resp 27 | Ht <= 58 in | Wt 142.0 lb

## 2013-10-30 DIAGNOSIS — E669 Obesity, unspecified: Secondary | ICD-10-CM | POA: Diagnosis not present

## 2013-10-30 DIAGNOSIS — K2970 Gastritis, unspecified, without bleeding: Secondary | ICD-10-CM

## 2013-10-30 DIAGNOSIS — K2960 Other gastritis without bleeding: Secondary | ICD-10-CM

## 2013-10-30 DIAGNOSIS — K259 Gastric ulcer, unspecified as acute or chronic, without hemorrhage or perforation: Secondary | ICD-10-CM | POA: Diagnosis not present

## 2013-10-30 DIAGNOSIS — I10 Essential (primary) hypertension: Secondary | ICD-10-CM | POA: Diagnosis not present

## 2013-10-30 DIAGNOSIS — I428 Other cardiomyopathies: Secondary | ICD-10-CM | POA: Diagnosis not present

## 2013-10-30 DIAGNOSIS — IMO0001 Reserved for inherently not codable concepts without codable children: Secondary | ICD-10-CM | POA: Diagnosis not present

## 2013-10-30 DIAGNOSIS — E039 Hypothyroidism, unspecified: Secondary | ICD-10-CM | POA: Diagnosis not present

## 2013-10-30 DIAGNOSIS — K2990 Gastroduodenitis, unspecified, without bleeding: Secondary | ICD-10-CM

## 2013-10-30 DIAGNOSIS — R634 Abnormal weight loss: Secondary | ICD-10-CM | POA: Diagnosis not present

## 2013-10-30 DIAGNOSIS — R110 Nausea: Secondary | ICD-10-CM | POA: Diagnosis not present

## 2013-10-30 DIAGNOSIS — A048 Other specified bacterial intestinal infections: Secondary | ICD-10-CM | POA: Diagnosis not present

## 2013-10-30 DIAGNOSIS — R112 Nausea with vomiting, unspecified: Secondary | ICD-10-CM

## 2013-10-30 MED ORDER — OMEPRAZOLE 20 MG PO CPDR: DELAYED_RELEASE_CAPSULE | ORAL | Status: DC

## 2013-10-30 MED ORDER — SODIUM CHLORIDE 0.9 % IV SOLN: 500.0000 mL | INTRAVENOUS | Status: DC

## 2013-10-30 NOTE — Op Note (Signed)
[place]  [address]. [place], [address]   ENDOSCOPY PROCEDURE REPORT  PATIENT: [name], [name]  MR#: [id number] BIRTHDATE: [birth year] , 82  yrs. old GENDER: Female ENDOSCOPIST: Lafayette Dragon, MD REFERRED BY:  Unice Cobble, M.D. PROCEDURE DATE:  10/30/2013 PROCEDURE:  EGD w/ biopsy ASA CLASS:     Class III INDICATIONS:  Nausea.   Vomiting.   weight loss last endoscopy in January 2009 and prior to that in 2002 showed acute gastritis which was H.  pylori negative. MEDICATIONS: MAC sedation, administered by CRNA and Propofol (Diprivan) 90 mg IV TOPICAL ANESTHETIC: none  DESCRIPTION OF PROCEDURE: After the risks benefits and alternatives of the procedure were thoroughly explained, informed consent was obtained.  The LB [id number] endoscope was introduced through the mouth and advanced to the second portion of the duodenum. Without limitations.  The instrument was slowly withdrawn as the mucosa was fully examined.      Esophagus: Proximal mid and distal esophageal mucosa was normal. Squamocolumnar junction appeared normal. There was no stricture or esophagitis. There was a small reducible hiatal hernia Stomach: There was decreased rugal pattern. There was reticulation and erythema throughout the body and the gastric antrum. There was a small shallow 6-7 mm ulcer in the gastric antrum surrounded by multiple erosions. There was no sign of bleeding. Gastric outlet was normal. Biopsies were taken to rule out H. pylori. Retroflexion of the endoscope revealed normal fundus and cardia Duodenum duodenal bulb and descending duodenum was normal[ The scope was then withdrawn from the patient and the procedure completed.  COMPLICATIONS: There were no complications. ENDOSCOPIC IMPRESSION:  small benign-appearing antral ulcer status post biopsies Gastritis antrum and body Small reducible hiatal hernia  RECOMMENDATIONS: 1.  Await pathology results 2.   Continue PPI 3.  Continue Zofran and dietary modifications.  Patient has been feeling better since her last visit.  I feel that some of her symptoms are stress-related  REPEAT EXAM: for EGD pending biopsy results.  eSigned:  Lafayette Dragon, MD 10/30/2013 8:24 AM   CC:  PATIENT NAME:  Leliana, Kontz MR#: [id number]

## 2013-10-30 NOTE — Patient Instructions (Addendum)
YOU HAD AN ENDOSCOPIC PROCEDURE TODAY AT THE [facility]: Refer to the procedure report that was given to you for any specific questions about what was found during the examination.  If the procedure report does not answer your questions, please call your gastroenterologist to clarify.  If you requested that your care partner not be given the details of your procedure findings, then the procedure report has been included in a sealed envelope for you to review at your convenience later.  YOU SHOULD EXPECT: Some feelings of bloating in the abdomen. Passage of more gas than usual.  Walking can help get rid of the air that was put into your GI tract during the procedure and reduce the bloating. If you had a lower endoscopy (such as a colonoscopy or flexible sigmoidoscopy) you may notice spotting of blood in your stool or on the toilet paper. If you underwent a bowel prep for your procedure, then you may not have a normal bowel movement for a few days.  DIET: Your first meal following the procedure should be a light meal and then it is ok to progress to your normal diet.  A half-sandwich or bowl of soup is an example of a good first meal.  Heavy or fried foods are harder to digest and may make you feel nauseous or bloated.  Likewise meals heavy in dairy and vegetables can cause extra gas to form and this can also increase the bloating.  Drink plenty of fluids but you should avoid alcoholic beverages for 24 hours.  ACTIVITY: Your care partner should take you home directly after the procedure.  You should plan to take it easy, moving slowly for the rest of the day.  You can resume normal activity the day after the procedure however you should NOT DRIVE or use heavy machinery for 24 hours (because of the sedation medicines used during the test).    SYMPTOMS TO REPORT IMMEDIATELY: A gastroenterologist can be reached at any hour.  During normal business hours, 8:30 AM to 5:00 PM Monday through Friday,  call [phone].  After hours and on weekends, please call the GI answering service at [phone] who will take a message and have the physician on call contact you.     Following upper endoscopy (EGD)  Vomiting of blood or coffee ground material  New chest pain or pain under the shoulder blades  Painful or persistently difficult swallowing  New shortness of breath  Fever of 100F or higher  Black, tarry-looking stools  FOLLOW UP: If any biopsies were taken you will be contacted by phone or by letter within the next 1-3 weeks.  Call your gastroenterologist if you have not heard about the biopsies in 3 weeks.  Our staff will call the home number listed on your records the next business day following your procedure to check on you and address any questions or concerns that you may have at that time regarding the information given to you following your procedure. This is a courtesy call and so if there is no answer at the home number and we have not heard from you through the emergency physician on call, we will assume that you have returned to your regular daily activities without incident.  SIGNATURES/CONFIDENTIALITY: You and/or your care partner have signed paperwork which will be entered into your electronic medical record.  These signatures attest to the fact that that the information above on your After Visit Summary has been reviewed and is understood.    Full responsibility of the confidentiality of this discharge information lies with you and/or your care-partner.   Prilosec ( anti reflux medication ) 20 mg every am 30 minutes before 1st meal of the day . This order was sent to [facility] for you to pick up.  Continue Zofran if needed and dietary modifications  Appointment to see Dr. Olevia Perches back as you discussed with her

## 2013-10-30 NOTE — Progress Notes (Signed)
Procedure ends, to recovery, report given and VSS. 

## 2013-10-30 NOTE — Progress Notes (Signed)
Called to room to assist during endoscopic procedure.  Patient ID and intended procedure confirmed with present staff. Received instructions for my participation in the procedure from the performing physician.  

## 2013-11-02 ENCOUNTER — Telehealth: Payer: Self-pay | Admitting: *Deleted

## 2013-11-02 ENCOUNTER — Telehealth: Payer: Self-pay | Admitting: Internal Medicine

## 2013-11-02 NOTE — Telephone Encounter (Signed)
May have Zofran 4mg , #30 1 po q8 hrs prn nausea, 1 refill

## 2013-11-02 NOTE — Telephone Encounter (Signed)
Spoke with patient and she is having problems with nausea. She states she does not have any Zofran. Per procedure note she was to take it for nausea. Can she have rx for Zofran 4 mg? Please, advise.

## 2013-11-02 NOTE — Telephone Encounter (Signed)
  Follow up Call-  Call back number 10/30/2013  Post procedure Call Back phone  # [phone]  Permission to leave phone message Yes     Patient questions:  Do you have a fever, pain , or abdominal swelling? no Pain Score  0 *  Have you tolerated food without any problems? yes  Have you been able to return to your normal activities? yes  Do you have any questions about your discharge instructions: Diet   no Medications  no Follow up visit  no  Do you have questions or concerns about your Care? no  Actions: * If pain score is 4 or above: No action needed, pain <4.

## 2013-11-03 ENCOUNTER — Telehealth: Payer: Self-pay | Admitting: *Deleted

## 2013-11-03 ENCOUNTER — Encounter: Payer: Self-pay | Admitting: Internal Medicine

## 2013-11-03 MED ORDER — ONDANSETRON HCL 4 MG PO TABS: 4.0000 mg | ORAL_TABLET | Freq: Three times a day (TID) | ORAL | Status: DC | PRN

## 2013-11-03 NOTE — Telephone Encounter (Signed)
Spoke with pt advised of MDs message 

## 2013-11-03 NOTE — Telephone Encounter (Signed)
She needs OV with all meds

## 2013-11-03 NOTE — Telephone Encounter (Signed)
Spoke with patient and told her I have sent rx to pharmacy.

## 2013-11-03 NOTE — Telephone Encounter (Signed)
Line busy will try again later. Rx sent to pharmacy.

## 2013-11-03 NOTE — Telephone Encounter (Signed)
Pt called states Bupropion is not effective.  Please advise

## 2013-11-04 ENCOUNTER — Telehealth: Payer: Self-pay | Admitting: *Deleted

## 2013-11-04 DIAGNOSIS — K259 Gastric ulcer, unspecified as acute or chronic, without hemorrhage or perforation: Secondary | ICD-10-CM

## 2013-11-04 MED ORDER — METRONIDAZOLE 500 MG PO TABS: ORAL_TABLET | ORAL | Status: DC

## 2013-11-04 MED ORDER — CLARITHROMYCIN 500 MG PO TABS: ORAL_TABLET | ORAL | Status: DC

## 2013-11-04 MED ORDER — OMEPRAZOLE 20 MG PO CPDR: DELAYED_RELEASE_CAPSULE | ORAL | Status: DC

## 2013-11-04 NOTE — Telephone Encounter (Signed)
Please substitute Flagyl 500 mg po tid x 10 days., #30

## 2013-11-04 NOTE — Telephone Encounter (Signed)
Please call pt to start on a H.pylori treatment with Bioxin 500mg  po tid, Amoxacillin 500mg  po tid and a PPI bid x 10 days.

## 2013-11-04 NOTE — Telephone Encounter (Signed)
Patient is allergic to penicillins. Please advise.

## 2013-11-04 NOTE — Telephone Encounter (Signed)
Patient given results and recommendations. Rx's sent to pharmacy.

## 2013-11-05 ENCOUNTER — Telehealth: Payer: Self-pay | Admitting: Internal Medicine

## 2013-11-05 NOTE — Telephone Encounter (Signed)
Reviewed Omeprazole instructions with pt and answered her questions.

## 2013-11-07 ENCOUNTER — Encounter: Payer: Self-pay | Admitting: Internal Medicine

## 2013-11-08 ENCOUNTER — Encounter: Payer: Self-pay | Admitting: Internal Medicine

## 2013-11-08 ENCOUNTER — Inpatient Hospital Stay (HOSPITAL_COMMUNITY)
Admission: EM | Admit: 2013-11-08 | Discharge: 2013-11-12 | DRG: 125 | Disposition: A | Discharge status: Home Health | Payer: Medicare Other | Attending: Internal Medicine | Admitting: Internal Medicine

## 2013-11-08 ENCOUNTER — Encounter (HOSPITAL_COMMUNITY): Payer: Self-pay | Admitting: Emergency Medicine

## 2013-11-08 DIAGNOSIS — F329 Major depressive disorder, single episode, unspecified: Secondary | ICD-10-CM | POA: Diagnosis not present

## 2013-11-08 DIAGNOSIS — E785 Hyperlipidemia, unspecified: Secondary | ICD-10-CM | POA: Diagnosis present

## 2013-11-08 DIAGNOSIS — D649 Anemia, unspecified: Secondary | ICD-10-CM | POA: Diagnosis present

## 2013-11-08 DIAGNOSIS — C8409 Mycosis fungoides, extranodal and solid organ sites: Secondary | ICD-10-CM

## 2013-11-08 DIAGNOSIS — Z881 Allergy status to other antibiotic agents status: Secondary | ICD-10-CM | POA: Diagnosis not present

## 2013-11-08 DIAGNOSIS — K259 Gastric ulcer, unspecified as acute or chronic, without hemorrhage or perforation: Secondary | ICD-10-CM | POA: Diagnosis present

## 2013-11-08 DIAGNOSIS — F3289 Other specified depressive episodes: Secondary | ICD-10-CM | POA: Diagnosis not present

## 2013-11-08 DIAGNOSIS — Z79899 Other long term (current) drug therapy: Secondary | ICD-10-CM | POA: Diagnosis not present

## 2013-11-08 DIAGNOSIS — F419 Anxiety disorder, unspecified: Secondary | ICD-10-CM

## 2013-11-08 DIAGNOSIS — Z87891 Personal history of nicotine dependence: Secondary | ICD-10-CM

## 2013-11-08 DIAGNOSIS — E871 Hypo-osmolality and hyponatremia: Secondary | ICD-10-CM | POA: Diagnosis not present

## 2013-11-08 DIAGNOSIS — I10 Essential (primary) hypertension: Secondary | ICD-10-CM | POA: Diagnosis present

## 2013-11-08 DIAGNOSIS — E8881 Metabolic syndrome: Secondary | ICD-10-CM

## 2013-11-08 DIAGNOSIS — IMO0001 Reserved for inherently not codable concepts without codable children: Secondary | ICD-10-CM

## 2013-11-08 DIAGNOSIS — H5316 Psychophysical visual disturbances: Secondary | ICD-10-CM | POA: Diagnosis present

## 2013-11-08 DIAGNOSIS — Z833 Family history of diabetes mellitus: Secondary | ICD-10-CM

## 2013-11-08 DIAGNOSIS — R110 Nausea: Secondary | ICD-10-CM | POA: Diagnosis present

## 2013-11-08 DIAGNOSIS — E039 Hypothyroidism, unspecified: Secondary | ICD-10-CM | POA: Diagnosis present

## 2013-11-08 DIAGNOSIS — Z8249 Family history of ischemic heart disease and other diseases of the circulatory system: Secondary | ICD-10-CM | POA: Diagnosis not present

## 2013-11-08 DIAGNOSIS — I422 Other hypertrophic cardiomyopathy: Secondary | ICD-10-CM | POA: Diagnosis present

## 2013-11-08 DIAGNOSIS — R911 Solitary pulmonary nodule: Secondary | ICD-10-CM

## 2013-11-08 DIAGNOSIS — M810 Age-related osteoporosis without current pathological fracture: Secondary | ICD-10-CM | POA: Diagnosis present

## 2013-11-08 DIAGNOSIS — E782 Mixed hyperlipidemia: Secondary | ICD-10-CM

## 2013-11-08 DIAGNOSIS — K5730 Diverticulosis of large intestine without perforation or abscess without bleeding: Secondary | ICD-10-CM | POA: Diagnosis present

## 2013-11-08 DIAGNOSIS — Z823 Family history of stroke: Secondary | ICD-10-CM

## 2013-11-08 DIAGNOSIS — A048 Other specified bacterial intestinal infections: Secondary | ICD-10-CM | POA: Diagnosis present

## 2013-11-08 DIAGNOSIS — Z7982 Long term (current) use of aspirin: Secondary | ICD-10-CM | POA: Diagnosis not present

## 2013-11-08 DIAGNOSIS — N259 Disorder resulting from impaired renal tubular function, unspecified: Secondary | ICD-10-CM

## 2013-11-08 DIAGNOSIS — M1990 Unspecified osteoarthritis, unspecified site: Secondary | ICD-10-CM

## 2013-11-08 DIAGNOSIS — Z888 Allergy status to other drugs, medicaments and biological substances status: Secondary | ICD-10-CM

## 2013-11-08 DIAGNOSIS — F32A Depression, unspecified: Secondary | ICD-10-CM

## 2013-11-08 DIAGNOSIS — E559 Vitamin D deficiency, unspecified: Secondary | ICD-10-CM | POA: Diagnosis present

## 2013-11-08 DIAGNOSIS — R9431 Abnormal electrocardiogram [ECG] [EKG]: Secondary | ICD-10-CM

## 2013-11-08 DIAGNOSIS — Z86010 Personal history of colonic polyps: Secondary | ICD-10-CM

## 2013-11-08 DIAGNOSIS — T50995A Adverse effect of other drugs, medicaments and biological substances, initial encounter: Secondary | ICD-10-CM | POA: Diagnosis present

## 2013-11-08 DIAGNOSIS — R441 Visual hallucinations: Secondary | ICD-10-CM | POA: Diagnosis present

## 2013-11-08 DIAGNOSIS — R531 Weakness: Secondary | ICD-10-CM

## 2013-11-08 DIAGNOSIS — R443 Hallucinations, unspecified: Secondary | ICD-10-CM | POA: Diagnosis not present

## 2013-11-08 DIAGNOSIS — F341 Dysthymic disorder: Secondary | ICD-10-CM | POA: Diagnosis not present

## 2013-11-08 HISTORY — DX: Gastric ulcer, unspecified as acute or chronic, without hemorrhage or perforation: K25.9

## 2013-11-08 HISTORY — DX: Gastric ulcer, unspecified as acute or chronic, without hemorrhage or perforation: B96.81

## 2013-11-08 LAB — COMPREHENSIVE METABOLIC PANEL
ALK PHOS: 67 U/L (ref 39–117)
ALT: 15 U/L (ref 0–35)
AST: 16 U/L (ref 0–37)
Albumin: 3.5 g/dL (ref 3.5–5.2)
BUN: 13 mg/dL (ref 6–23)
CHLORIDE: 89 meq/L — AB (ref 96–112)
CO2: 23 mEq/L (ref 19–32)
Calcium: 9.1 mg/dL (ref 8.4–10.5)
Creatinine, Ser: 0.85 mg/dL (ref 0.50–1.10)
GFR calc Af Amer: 71 mL/min — ABNORMAL LOW (ref >90)
GFR calc non Af Amer: 62 mL/min — ABNORMAL LOW (ref >90)
Glucose, Bld: 115 mg/dL — ABNORMAL HIGH (ref 70–99)
POTASSIUM: 4.6 meq/L (ref 3.7–5.3)
SODIUM: 124 meq/L — AB (ref 137–147)
TOTAL PROTEIN: 6.3 g/dL (ref 6.0–8.3)
Total Bilirubin: 0.3 mg/dL (ref 0.3–1.2)

## 2013-11-08 LAB — CBC WITH DIFFERENTIAL/PLATELET
BASOS PCT: 0 % (ref 0–1)
Basophils Absolute: 0 10*3/uL (ref 0.0–0.1)
Eosinophils Absolute: 0 10*3/uL (ref 0.0–0.7)
Eosinophils Relative: 0 % (ref 0–5)
HCT: 32 % — ABNORMAL LOW (ref 36.0–46.0)
HEMOGLOBIN: 10.9 g/dL — AB (ref 12.0–15.0)
Lymphocytes Relative: 14 % (ref 12–46)
Lymphs Abs: 1 10*3/uL (ref 0.7–4.0)
MCH: 28.7 pg (ref 26.0–34.0)
MCHC: 34.1 g/dL (ref 30.0–36.0)
MCV: 84.2 fL (ref 78.0–100.0)
MONOS PCT: 11 % (ref 3–12)
Monocytes Absolute: 0.8 10*3/uL (ref 0.1–1.0)
NEUTROS ABS: 5.8 10*3/uL (ref 1.7–7.7)
Neutrophils Relative %: 75 % (ref 43–77)
PLATELETS: 250 10*3/uL (ref 150–400)
RBC: 3.8 MIL/uL — ABNORMAL LOW (ref 3.87–5.11)
RDW: 12.9 % (ref 11.5–15.5)
WBC: 7.6 10*3/uL (ref 4.0–10.5)

## 2013-11-08 LAB — URINE MICROSCOPIC-ADD ON

## 2013-11-08 LAB — URINALYSIS, ROUTINE W REFLEX MICROSCOPIC
BILIRUBIN URINE: NEGATIVE
GLUCOSE, UA: NEGATIVE mg/dL
HGB URINE DIPSTICK: NEGATIVE
Ketones, ur: NEGATIVE mg/dL
Nitrite: NEGATIVE
PROTEIN: NEGATIVE mg/dL
Specific Gravity, Urine: 1.008 (ref 1.005–1.030)
Urobilinogen, UA: 0.2 mg/dL (ref 0.0–1.0)
pH: 6.5 (ref 5.0–8.0)

## 2013-11-08 MED ORDER — BUPROPION HCL ER (SR) 100 MG PO TB12
100.0000 mg | ORAL_TABLET | Freq: Every day | ORAL | Status: DC
  Administered 2013-11-09 – 2013-11-12 (×4): 100 mg via ORAL
  Filled 2013-11-08 (×4): qty 1

## 2013-11-08 MED ORDER — ASPIRIN 81 MG PO CHEW
81.0000 mg | CHEWABLE_TABLET | Freq: Every morning | ORAL | Status: DC
  Administered 2013-11-09 – 2013-11-12 (×4): 81 mg via ORAL
  Filled 2013-11-08 (×4): qty 1

## 2013-11-08 MED ORDER — LEVOTHYROXINE SODIUM 75 MCG PO TABS
75.0000 ug | ORAL_TABLET | ORAL | Status: DC
  Administered 2013-11-09 – 2013-11-12 (×3): 75 ug via ORAL
  Filled 2013-11-08 (×4): qty 1

## 2013-11-08 MED ORDER — CLARITHROMYCIN 500 MG PO TABS
500.0000 mg | ORAL_TABLET | Freq: Two times a day (BID) | ORAL | Status: DC
  Filled 2013-11-08 (×3): qty 1

## 2013-11-08 MED ORDER — ONDANSETRON 8 MG PO TBDP
8.0000 mg | ORAL_TABLET | Freq: Once | ORAL | Status: AC
  Administered 2013-11-08: 8 mg via ORAL
  Filled 2013-11-08: qty 1

## 2013-11-08 MED ORDER — PANTOPRAZOLE SODIUM 40 MG PO TBEC
40.0000 mg | DELAYED_RELEASE_TABLET | Freq: Every day | ORAL | Status: DC
  Administered 2013-11-09 – 2013-11-12 (×4): 40 mg via ORAL
  Filled 2013-11-08 (×4): qty 1

## 2013-11-08 MED ORDER — LORAZEPAM 0.5 MG PO TABS
0.5000 mg | ORAL_TABLET | Freq: Three times a day (TID) | ORAL | Status: DC | PRN
  Administered 2013-11-09: 0.5 mg via ORAL
  Filled 2013-11-08: qty 1

## 2013-11-08 MED ORDER — ONDANSETRON HCL 4 MG/2ML IJ SOLN
4.0000 mg | Freq: Four times a day (QID) | INTRAMUSCULAR | Status: DC | PRN
  Administered 2013-11-09 – 2013-11-10 (×6): 4 mg via INTRAVENOUS
  Filled 2013-11-08 (×6): qty 2

## 2013-11-08 MED ORDER — LEVOTHYROXINE SODIUM 75 MCG PO TABS
37.5000 ug | ORAL_TABLET | ORAL | Status: DC
  Administered 2013-11-10: 37.5 ug via ORAL
  Filled 2013-11-08: qty 0.5

## 2013-11-08 MED ORDER — METRONIDAZOLE 500 MG PO TABS
500.0000 mg | ORAL_TABLET | Freq: Three times a day (TID) | ORAL | Status: DC
  Administered 2013-11-09 – 2013-11-12 (×11): 500 mg via ORAL
  Filled 2013-11-08 (×14): qty 1

## 2013-11-08 MED ORDER — ENOXAPARIN SODIUM 40 MG/0.4ML ~~LOC~~ SOLN
40.0000 mg | SUBCUTANEOUS | Status: DC
  Administered 2013-11-09 – 2013-11-12 (×4): 40 mg via SUBCUTANEOUS
  Filled 2013-11-08 (×4): qty 0.4

## 2013-11-08 MED ORDER — SODIUM CHLORIDE 0.9 % IV SOLN
INTRAVENOUS | Status: DC
  Administered 2013-11-08: 23:00:00 via INTRAVENOUS

## 2013-11-08 MED ORDER — ACETAMINOPHEN 325 MG PO TABS
650.0000 mg | ORAL_TABLET | ORAL | Status: DC | PRN
  Administered 2013-11-08 – 2013-11-12 (×8): 650 mg via ORAL
  Filled 2013-11-08 (×8): qty 2

## 2013-11-08 MED ORDER — ONDANSETRON HCL 4 MG PO TABS: 4.0000 mg | ORAL_TABLET | Freq: Three times a day (TID) | ORAL | Status: DC | PRN

## 2013-11-08 MED ORDER — LISINOPRIL 10 MG PO TABS
10.0000 mg | ORAL_TABLET | Freq: Every day | ORAL | Status: DC
  Administered 2013-11-09 – 2013-11-12 (×4): 10 mg via ORAL
  Filled 2013-11-08 (×4): qty 1

## 2013-11-08 NOTE — ED Notes (Signed)
Per pt report: Pt recently put on 2 antibiotics for stomach ulcers. Pt had a endoscopy with Dr. Maurene Capes on may 29 and found pt's ulcers was caused by H. Pylori. Pt did not sleep at all last night.  Family reports pt is seeing flowers and people on the walls.  Pt took herself off wellbutrin last Thursday without telling anyone.  Pt denies hearing any voices.

## 2013-11-08 NOTE — ED Notes (Signed)
MD at bedside. 

## 2013-11-08 NOTE — ED Notes (Signed)
Family states pt has been taking flagyl and biaxin for h pylori and has been not feeling well from it for the past two days  They called the dr and they discontinued it today  Pt stopped taking her wellbutrin 3 days ago and did not tell anyone  Today pt has been having visual hallucinations seeing flowers and people on the walls  Pt states she sees chairs and a table on the wall in her hospital room  Denies visual hallucinations

## 2013-11-08 NOTE — ED Provider Notes (Signed)
CSN: [id number]     Arrival date & time 11/08/13  1949 History   First MD Initiated Contact with Patient 11/08/13 2039     Chief Complaint  Patient presents with  . Nausea  . Hallucinations     (Consider location/radiation/quality/duration/timing/severity/associated sxs/prior Treatment) The history is provided by the patient and a relative.   patient here with experiencing visual hallucinations which began 2 days ago. Patient recently stopped her Wellbutrin without consulting her doctor. That was stopped 3 days ago and she restarted her Wellbutrin today. Denies any visualization. No fever or chills. No confusion noted. No muscle pain or stiffness. She did have some insomnia. She's also recently started on medications for a tolerate what she had to stop due to medication intolerance. Her symptoms have been persistent and nothing makes them better worse. No treatment used prior to arrival. Denies any suicidal ideations.  Past Medical History  Diagnosis Date  . Dysmetabolic syndrome X   . Hyperlipidemia   . Other abnormal glucose   . Hypothyroidism   . DIVERTICULOSIS, COLON   . Hyperplastic colon polyp   . FIBROMYALGIA   . HYPERLIPIDEMIA   . HYPERTENSION, ESSENTIAL NOS   . HYPERTROPHIC CARDIOMYOPATHY   . Hyponatremia   . MYCOSIS FUNGOIDES   . OSTEOARTHRITIS   . OSTEOPOROSIS   . RENAL INSUFFICIENCY   . Unspecified vitamin D deficiency   . Nodule of left lung   . Normocytic anemia   . Adrenal myelolipoma   . Renal angiomyolipoma   . Gastric ulcer due to Helicobacter pylori    Past Surgical History  Procedure Laterality Date  . G 4 p 2    . Colonoscopy with polypectomy  07/25/2007    Dr Olevia Perches, Recheck in 7 years for 2009   Family History  Problem Relation Age of Onset  . Diabetes Mother   . Heart attack Maternal Uncle 86    his 2 sons had MI in 59s  . Hypertension Maternal Grandmother   . Stroke Maternal Grandmother 69  . Cancer Neg Hx   . Asthma Neg Hx   . COPD Neg Hx     History  Substance Use Topics  . Smoking status: Former Smoker    Quit date: 06/05/1963  . Smokeless tobacco: Never Used     Comment: smoked 1951-1965 , up to 1/2 ppd  . Alcohol Use: No   OB History   Grav Para Term Preterm Abortions TAB SAB Ect Mult Living                 Review of Systems  All other systems reviewed and are negative.     Allergies  Achromycin; Omnipen; Simvastatin; Celecoxib; Ezetimibe-simvastatin; and Abilify  Home Medications   Prior to Admission medications   Medication Sig Start Date End Date Taking? Authorizing Provider  acetaminophen (TYLENOL) 325 MG tablet Take 650 mg by mouth 3 (three) times daily.    Historical Provider, MD  aspirin 81 MG chewable tablet Chew 81 mg by mouth every morning.    Historical Provider, MD  beta carotene w/minerals (OCUVITE) tablet Take 1 tablet by mouth every morning.    Historical Provider, MD  buPROPion (WELLBUTRIN SR) 100 MG 12 hr tablet TAKE ONE TABLET BY MOUTH ONE TIME DAILY     Hendricks Limes, MD  cholecalciferol (VITAMIN D) 1000 UNITS tablet Take 2,000 Units by mouth every morning.    Historical Provider, MD  clarithromycin (BIAXIN) 500 MG tablet Take one po BID x 10  days 11/04/13   Lafayette Dragon, MD  levothyroxine (SYNTHROID, LEVOTHROID) 75 MCG tablet Take 37.5-75 mcg by mouth daily before breakfast. Take half of a tablet on Tuesdays and Saturdays. On all other days take 1 tablet    Historical Provider, MD  lisinopril (PRINIVIL,ZESTRIL) 20 MG tablet TAKE ONE TABLET BY MOUTH ONE TIME DAILY  09/08/13   Hendricks Limes, MD  LORazepam (ATIVAN) 0.5 MG tablet Take 0.5 mg by mouth every 8 (eight) hours as needed for anxiety.    Historical Provider, MD  metoCLOPramide (REGLAN) 5 MG tablet Take 1 tablet (5 mg total) by mouth 3 (three) times daily before meals. 07/20/13   Bonnielee Haff, MD  metroNIDAZOLE (FLAGYL) 500 MG tablet Take one po TID x 10 days 11/04/13   Lafayette Dragon, MD  omeprazole (PRILOSEC) 20 MG capsule Take one  capsule BID x 10 days then take one po daily 11/04/13   Lafayette Dragon, MD  ondansetron (ZOFRAN) 4 MG tablet Take 1 tablet (4 mg total) by mouth every 8 (eight) hours as needed for nausea or vomiting. 11/03/13   Lafayette Dragon, MD   BP 177/72  Pulse 71  Temp(Src) 97.9 F (36.6 C) (Oral)  Resp 20  SpO2 100% Physical Exam  Nursing note and vitals reviewed. Constitutional: She is oriented to person, place, and time. She appears well-developed and well-nourished.  Non-toxic appearance. No distress.  HENT:  Head: Normocephalic and atraumatic.  Eyes: Conjunctivae, EOM and lids are normal. Pupils are equal, round, and reactive to light.  Neck: Normal range of motion. Neck supple. No tracheal deviation present. No mass present.  Cardiovascular: Normal rate, regular rhythm and normal heart sounds.  Exam reveals no gallop.   No murmur heard. Pulmonary/Chest: Effort normal and breath sounds normal. No stridor. No respiratory distress. She has no decreased breath sounds. She has no wheezes. She has no rhonchi. She has no rales.  Abdominal: Soft. Normal appearance and bowel sounds are normal. She exhibits no distension. There is no tenderness. There is no rebound and no CVA tenderness.  Musculoskeletal: Normal range of motion. She exhibits no edema and no tenderness.  Neurological: She is alert and oriented to person, place, and time. She has normal strength. No cranial nerve deficit or sensory deficit. Coordination normal. GCS eye subscore is 4. GCS verbal subscore is 5. GCS motor subscore is 6.  Skin: Skin is warm and dry. No abrasion and no rash noted.  Psychiatric: She has a normal mood and affect. Her speech is normal. She is actively hallucinating. Thought content is paranoid. Thought content is not delusional. Cognition and memory are not impaired.  Patient notes visual hallucinations consisting of same flowers on the walls. Denies any auditory hallucinations.    ED Course  Procedures (including  critical care time) Labs Review Labs Reviewed  URINE CULTURE  URINALYSIS, ROUTINE W REFLEX MICROSCOPIC  CBC WITH DIFFERENTIAL  COMPREHENSIVE METABOLIC PANEL    Imaging Review No results found.   EKG Interpretation None      MDM   Final diagnoses:  None    Patient started on IV saline here and will be admitted for treatment of her hyponatremia    Leota Jacobsen, MD 11/08/13 2228

## 2013-11-08 NOTE — H&P (Signed)
[facility] History and Physical  [name] PPI:[id number] DOB: May 16, 1928 DOA: 11/08/2013  Referring physician: EDP PCP: Unice Cobble, MD   Chief Complaint: nausea and hallucinations since 2 days  HPI: [name] is a 80 y.o. female with prior h/o hypertension, hypothyroidism, chronic hyponatremia, recently diagnosed h pylori comes in for persistent nausea, and visual hallucinations sicne 2 days. On arrival to ED, she was found to be hyponatremic with sodium of 124 and she also reports stopping the wellbutrin 4 days ago. She is referred to medical service for admission for evaluation and management of the above. She denies any other complaints.    Review of Systems:  See hpi otherwise negative.   Past Medical History  Diagnosis Date  . Dysmetabolic syndrome X   . Hyperlipidemia   . Other abnormal glucose   . Hypothyroidism   . DIVERTICULOSIS, COLON   . Hyperplastic colon polyp   . FIBROMYALGIA   . HYPERLIPIDEMIA   . HYPERTENSION, ESSENTIAL NOS   . HYPERTROPHIC CARDIOMYOPATHY   . Hyponatremia   . MYCOSIS FUNGOIDES   . OSTEOARTHRITIS   . OSTEOPOROSIS   . RENAL INSUFFICIENCY   . Unspecified vitamin D deficiency   . Nodule of left lung   . Normocytic anemia   . Adrenal myelolipoma   . Renal angiomyolipoma   . Gastric ulcer due to Helicobacter pylori    Past Surgical History  Procedure Laterality Date  . G 4 p 2    . Colonoscopy with polypectomy  07/25/2007    Dr Olevia Perches, Recheck in 7 years for 2009   Social History:  reports that she quit smoking about 50 years ago. She has never used smokeless tobacco. She reports that she does not drink alcohol or use illicit drugs.  Allergies  Allergen Reactions  . Achromycin [Tetracycline Hcl]     Facial swelling   . Omnipen [Ampicillin]     Facial swelling   . Simvastatin Other (See Comments)    12/15/12 myalgias  . Celecoxib Other (See Comments)    REACTION: bleeding  . Ezetimibe-Simvastatin Diarrhea,  Nausea And Vomiting and Other (See Comments)    REACTION: myalgia  . Abilify [Aripiprazole]     2/15 prescribed by psychiatry for depression along with Wellbutrin. This caused insomnia    Family History  Problem Relation Age of Onset  . Diabetes Mother   . Heart attack Maternal Uncle 29    his 2 sons had MI in 8s  . Hypertension Maternal Grandmother   . Stroke Maternal Grandmother 69  . Cancer Neg Hx   . Asthma Neg Hx   . COPD Neg Hx      Prior to Admission medications   Medication Sig Start Date End Date Taking? Authorizing Provider  acetaminophen (TYLENOL) 325 MG tablet Take 650 mg by mouth 3 (three) times daily.   Yes Historical Provider, MD  aspirin 81 MG chewable tablet Chew 81 mg by mouth every morning.   Yes Historical Provider, MD  beta carotene w/minerals (OCUVITE) tablet Take 1 tablet by mouth every morning.   Yes Historical Provider, MD  buPROPion (WELLBUTRIN SR) 100 MG 12 hr tablet Take 100 mg by mouth daily.   Yes Historical Provider, MD  cholecalciferol (VITAMIN D) 1000 UNITS tablet Take 2,000 Units by mouth every morning.   Yes Historical Provider, MD  clarithromycin (BIAXIN) 500 MG tablet Take one po BID x 10 days 11/04/13  Yes Lafayette Dragon, MD  levothyroxine (SYNTHROID, LEVOTHROID) 75 MCG tablet  Take 37.5-75 mcg by mouth daily before breakfast. Take half of a tablet on Tuesdays and Saturdays. On all other days take 1 tablet   Yes Historical Provider, MD  lisinopril (PRINIVIL,ZESTRIL) 20 MG tablet Take 10 mg by mouth daily. Take one half tablet   Yes Historical Provider, MD  LORazepam (ATIVAN) 0.5 MG tablet Take 0.5 mg by mouth every 8 (eight) hours as needed for anxiety.   Yes Historical Provider, MD  metroNIDAZOLE (FLAGYL) 500 MG tablet Take one po TID x 10 days 11/04/13  Yes Lafayette Dragon, MD  omeprazole (PRILOSEC) 20 MG capsule Take one capsule BID x 10 days then take one po daily 11/04/13  Yes Lafayette Dragon, MD  ondansetron (ZOFRAN) 4 MG tablet Take 1 tablet (4 mg  total) by mouth every 8 (eight) hours as needed for nausea or vomiting. 11/03/13  Yes Lafayette Dragon, MD   Physical Exam: Filed Vitals:   11/08/13 2313  BP: 158/76  Pulse: 70  Temp: 97.5 F (36.4 C)  Resp: 18    BP 158/76  Pulse 70  Temp(Src) 97.5 F (36.4 C) (Oral)  Resp 18  Ht 4\' 9"  (1.448 m)  Wt 64.003 kg (141 lb 1.6 oz)  BMI 30.53 kg/m2  SpO2 97%  General:  Appears calm and comfortable Eyes: PERRL, normal lids, irises & conjunctiva Neck: no LAD, masses or thyromegaly Cardiovascular: RRR, no m/r/g. No LE edema. Respiratory: CTA bilaterally, no w/r/r. Normal respiratory effort. Abdomen: soft, ntnd Skin: no rash or induration seen on limited exam Musculoskeletal: grossly normal tone BUE/BLE Neurologic: grossly non-focal.          Labs on Admission:  Basic Metabolic Panel:  Recent Labs Lab 11/08/13 2100  NA 124*  K 4.6  CL 89*  CO2 23  GLUCOSE 115*  BUN 13  CREATININE 0.85  CALCIUM 9.1   Liver Function Tests:  Recent Labs Lab 11/08/13 2100  AST 16  ALT 15  ALKPHOS 67  BILITOT 0.3  PROT 6.3  ALBUMIN 3.5   No results found for this basename: LIPASE, AMYLASE,  in the last 168 hours No results found for this basename: AMMONIA,  in the last 168 hours CBC:  Recent Labs Lab 11/08/13 2100  WBC 7.6  NEUTROABS 5.8  HGB 10.9*  HCT 32.0*  MCV 84.2  PLT 250   Cardiac Enzymes: No results found for this basename: CKTOTAL, CKMB, CKMBINDEX, TROPONINI,  in the last 168 hours  BNP (last 3 results) No results found for this basename: PROBNP,  in the last 8760 hours CBG: No results found for this basename: GLUCAP,  in the last 168 hours  Radiological Exams on Admission: No results found.  EKG: sinus   Assessment/Plan Active Problems:   HYPOTHYROIDISM   HYPERTENSION, ESSENTIAL NOS   Hyponatremia   Nausea alone   Normocytic anemia   Hallucination, visual   1.  Hyponatremia:  - appears to be chronic. Order tsh, urine studies ordered.  - gentle  hydration and repeat in am.   2. Hallucinations: - could be secondary to antibiotics vs stopping the wellbutrin.  - monitor.   3. Nausea: - secondary to flagyl and biaxin.  - anti emetics.   4. H pylori in the antral ulcer : - resume biaxin and flagyl.  - call Dr Olevia Perches if she gets intolerant to the above antibiotics.  - resume PPI.   5. Hypothyroidism: - TSH , resume synthroid.   6. DVT prophylaxis.     Code  Status: full code Family Communication: discussed with daughter at bedside.  Disposition Plan: admit to med surg  Time spent: 76 m inutes/   [facility] Pager [phone]  **Disclaimer: This note may have been dictated with voice recognition software. Similar sounding words can inadvertently be transcribed and this note may contain transcription errors which may not have been corrected upon publication of note.**

## 2013-11-09 DIAGNOSIS — E871 Hypo-osmolality and hyponatremia: Secondary | ICD-10-CM

## 2013-11-09 LAB — URINE CULTURE

## 2013-11-09 LAB — BASIC METABOLIC PANEL
BUN: 10 mg/dL (ref 6–23)
CALCIUM: 8.7 mg/dL (ref 8.4–10.5)
CO2: 23 mEq/L (ref 19–32)
CREATININE: 0.84 mg/dL (ref 0.50–1.10)
Chloride: 91 mEq/L — ABNORMAL LOW (ref 96–112)
GFR, EST AFRICAN AMERICAN: 72 mL/min — AB (ref >90)
GFR, EST NON AFRICAN AMERICAN: 63 mL/min — AB (ref >90)
Glucose, Bld: 91 mg/dL (ref 70–99)
Potassium: 4.6 mEq/L (ref 3.7–5.3)
Sodium: 125 mEq/L — ABNORMAL LOW (ref 137–147)

## 2013-11-09 LAB — TSH: TSH: 4.45 u[IU]/mL (ref 0.350–4.500)

## 2013-11-09 LAB — OSMOLALITY: OSMOLALITY: 259 mosm/kg — AB (ref 275–300)

## 2013-11-09 LAB — SODIUM, URINE, RANDOM: SODIUM UR: 51 meq/L

## 2013-11-09 LAB — OSMOLALITY, URINE: OSMOLALITY UR: 189 mosm/kg — AB (ref 390–1090)

## 2013-11-09 MED ORDER — LORAZEPAM 0.5 MG PO TABS
0.5000 mg | ORAL_TABLET | Freq: Two times a day (BID) | ORAL | Status: DC
  Administered 2013-11-09 – 2013-11-12 (×7): 0.5 mg via ORAL
  Filled 2013-11-09 (×7): qty 1

## 2013-11-09 NOTE — Care Management Note (Signed)
    Page 1 of 1   11/12/2013     1:11:05 PM CARE MANAGEMENT NOTE 11/12/2013  Patient:  [name], [name]   Account Number:  [id number]  Date Initiated:  11/09/2013  Documentation initiated by:  [facility]  Subjective/Objective Assessment:   80 year old female admitted with nausea and hallucinations.     Action/Plan:   From home. HHPT recommended.   Anticipated DC Date:  11/12/2013   Anticipated DC Plan:  [place]  CM consult      Choice offered to / List presented to:  C-1 Patient        [place] arranged  [place] [facility]      [place].   Status of service:  In process, will continue to follow Medicare Important Message given?  YES (If response is "NO", the following Medicare IM given date fields will be blank) Date Medicare IM given:  11/12/2013 Date Additional Medicare IM given:    Discharge Disposition:  [place]  Per UR Regulation:  Reviewed for med. necessity/level of care/duration of stay  If discussed at [place] of Stay Meetings, dates discussed:    Comments:  11/12/13 Allene Dillon RN BSN [phone] Met with pt at bedside to follow up with PT recommendation of HHPT. Offered choice for [facility] agency to provide the service. She chose [facility]. Referreal made.  11/09/13 Allene Dillon RN BSN [phone] Met with pt at bedside to discuss d/c planning. She informed me she lives alone and is independent of her ADL's. 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 Her daughter lives nearby and checks on her several times a day. She has been getting up to the bathroom with staff. I will follow up with her tomorrow.

## 2013-11-09 NOTE — Progress Notes (Signed)
PROGRESS NOTE  [name] VXB:[id number] DOB: 07-24-1927 DOA: 11/08/2013 PCP: Unice Cobble, MD  HPI: [name] is a 80 y.o. female with prior h/o hypertension, hypothyroidism, chronic hyponatremia, recently diagnosed h pylori comes in for persistent nausea, and visual hallucinations sicne 2 days. On arrival to ED, she was found to be hyponatremic with sodium of 124 and she also reports stopping the wellbutrin 4 days ago. She is referred to medical service for admission for evaluation and management of the above. She denies any other complaints.   Assessment/Plan:  Hallucinations - ?due to sudden discontinuation of Wellbutrin, will restart. - clarithromycin can cause hallucinations also, extremely rare but worth holding for now  - patient on Lorazepam at home, she is unclear on how often she is taking it, ?whether she stopped it few days ago and withdrawing, will schedule low dose.   Hyponatremia - this is somewhat chronic, low osm this morning, will change fluids to [facility] repeat BMP in the morning. Doubt that this is related to her hallucinations.  H PylorI infection - unfortunately patient's allergies make treatment choices limited. She is on Clarithromycin and Metronidazole.   Hypothyroidism - TSH OK, continue synthroid.   Nausea - improved some this morning  Diet: regular  Fluids: NS at [facility] DVT Prophylaxis: lovenox  Code Status: Full Family Communication: daughter over the phone  Disposition Plan: inpatient   Consultants:  None   Procedures:  None   Antibiotics  Anti-infectives   Start     Dose/Rate Route Frequency Ordered Stop   11/08/13 2359  metroNIDAZOLE (FLAGYL) tablet 500 mg     500 mg Oral 3 times per day 11/08/13 2321     11/08/13 2359  clarithromycin (BIAXIN) tablet 500 mg  Status:  Discontinued     500 mg Oral Every 12 hours 11/08/13 2321 11/09/13 0921     Antibiotics Given (last 72 hours)   Date/Time Action Medication Dose   11/09/13 0835  Given   metroNIDAZOLE (FLAGYL) tablet 500 mg 500 mg     HPI/Subjective: Still complains of ongoing hallucinations.   Objective: Filed Vitals:   11/08/13 2304 11/08/13 2313 11/09/13 0641 11/09/13 1454  BP: 153/48 158/76 157/63 132/73  Pulse: 62 70 64 69  Temp:  97.5 F (36.4 C) 97.8 F (36.6 C) 98.5 F (36.9 C)  TempSrc:  Oral Oral Oral  Resp: 16 18 18 18   Height:  4\' 9"  (1.448 m)    Weight:  64.003 kg (141 lb 1.6 oz)    SpO2: 100% 97% 96% 98%    Intake/Output Summary (Last 24 hours) at 11/09/13 1636 Last data filed at 11/09/13 0600  Gross per 24 hour  Intake 600.42 ml  Output    700 ml  Net -99.58 ml   Filed Weights   11/08/13 2313  Weight: 64.003 kg (141 lb 1.6 oz)   Exam:  General:  NAD  Cardiovascular: regular rate and rhythm, without MRG  Respiratory: good air movement, clear to auscultation throughout, no wheezing, ronchi or rales  Abdomen: soft, not tender to palpation, positive bowel sounds  MSK: no peripheral edema  Neuro: non focal  Data Reviewed: Basic Metabolic Panel:  Recent Labs Lab 11/08/13 2100 11/09/13 0520  NA 124* 125*  K 4.6 4.6  CL 89* 91*  CO2 23 23  GLUCOSE 115* 91  BUN 13 10  CREATININE 0.85 0.84  CALCIUM 9.1 8.7   Liver Function Tests:  Recent Labs Lab 11/08/13 2100  AST 16  ALT 15  ALKPHOS 67  BILITOT 0.3  PROT 6.3  ALBUMIN 3.5   CBC:  Recent Labs Lab 11/08/13 2100  WBC 7.6  NEUTROABS 5.8  HGB 10.9*  HCT 32.0*  MCV 84.2  PLT 250   Studies: No results found.  Scheduled Meds: . aspirin  81 mg Oral q morning - 10a  . buPROPion  100 mg Oral Daily  . enoxaparin (LOVENOX) injection  40 mg Subcutaneous Q24H  . [START ON 11/10/2013] levothyroxine  37.5 mcg Oral Once per day on Tue Sat  . levothyroxine  75 mcg Oral Once per day on Sun Mon Wed Thu Fri  . lisinopril  10 mg Oral Daily  . LORazepam  0.5 mg Oral BID  . metroNIDAZOLE  500 mg Oral 3 times per day  . pantoprazole  40 mg Oral Daily   Continuous  Infusions: . sodium chloride 75 mL/hr at 11/08/13 2348   Active Problems:   HYPOTHYROIDISM   HYPERTENSION, ESSENTIAL NOS   Hyponatremia   Nausea alone   Normocytic anemia   Hallucination, visual  Time spent: 35  This note has been created with Surveyor, quantity. Any transcriptional errors are unintentional.   Marzetta Board, MD [facility] Pager [phone]. If 7 PM - 7 AM, please contact night-coverage at [email or web address], password [facility] 11/09/2013, 4:36 PM  LOS: 1 day

## 2013-11-09 NOTE — Progress Notes (Signed)
INITIAL NUTRITION ASSESSMENT  DOCUMENTATION CODES Per approved criteria  -Obesity Unspecified   INTERVENTION: - [employer] Instant Breakfast at night - Anti-emetics per MD - Encouraged increased meal intake - RD to continue to monitor   NUTRITION DIAGNOSIS: Predicted suboptimal energy intake related to chronic nausea as evidenced by daughter's report.   Goal: 1. Resolution of nausea 2. Pt to consume >90% of meals/supplements   Monitor:  Weights, labs, intake, nausea   Reason for Assessment: Malnutrition screening tool   80 y.o. female  Admitting Dx: Nausea and hallucinations x 2 days  ASSESSMENT: Pt with h/o hypertension, hypothyroidism, chronic hyponatremia, recently diagnosed h pylori comes in for persistent nausea, and visual hallucinations sicne 2 days.   - Pt and daughter in room, both report pt eats small amounts of food at home like jello and cottage and cheese for a meal. Eats well on Sunday nights when she has dinner with her daughter - Report pt with usual body weight of 142 pounds, now 141 pounds - Daughter reports pt has had nausea for months but it worsening recently which she attributes to pt being on Flagyl  - Pt agreeable to getting [employer], will order  - Lunch arrived during conversation, unable to perform nutrition focused physical exam    Height: Ht Readings from Last 1 Encounters:  11/08/13 4\' 9"  (1.448 m)    Weight: Wt Readings from Last 1 Encounters:  11/08/13 141 lb 1.6 oz (64.003 kg)    Ideal Body Weight: 95 lbs   % Ideal Body Weight: 148%  Wt Readings from Last 10 Encounters:  11/08/13 141 lb 1.6 oz (64.003 kg)  10/30/13 142 lb (64.411 kg)  08/20/13 142 lb 3.2 oz (64.501 kg)  07/27/13 143 lb 1.3 oz (64.901 kg)  07/18/13 149 lb 7.6 oz (67.8 kg)  07/10/13 144 lb (65.318 kg)  07/03/13 145 lb 3.2 oz (65.862 kg)  06/26/13 149 lb 9.6 oz (67.858 kg)  09/09/12 155 lb (70.308 kg)  02/12/12 155 lb 3.2 oz (70.398 kg)     Usual Body Weight: 142 lbs per pt and daughter   % Usual Body Weight: 99%  BMI:  Body mass index is 30.53 kg/(m^2). Class I obesity   Estimated Nutritional Needs: Kcal: 1200-1400 Protein: 50-65g Fluid: 1.2-1.4L/day   Skin: Intact   Diet Order: General  EDUCATION NEEDS: -No education needs identified at this time   Intake/Output Summary (Last 24 hours) at 11/09/13 1402 Last data filed at 11/09/13 0600  Gross per 24 hour  Intake 600.42 ml  Output    700 ml  Net -99.58 ml    Last BM: 6/7  Labs:   Recent Labs Lab 11/08/13 2100 11/09/13 0520  NA 124* 125*  K 4.6 4.6  CL 89* 91*  CO2 23 23  BUN 13 10  CREATININE 0.85 0.84  CALCIUM 9.1 8.7  GLUCOSE 115* 91    CBG (last 3)  No results found for this basename: GLUCAP,  in the last 72 hours  Scheduled Meds: . aspirin  81 mg Oral q morning - 10a  . buPROPion  100 mg Oral Daily  . enoxaparin (LOVENOX) injection  40 mg Subcutaneous Q24H  . [START ON 11/10/2013] levothyroxine  37.5 mcg Oral Once per day on Tue Sat  . levothyroxine  75 mcg Oral Once per day on Sun Mon Wed Thu Fri  . lisinopril  10 mg Oral Daily  . LORazepam  0.5 mg Oral BID  . metroNIDAZOLE  500 mg  Oral 3 times per day  . pantoprazole  40 mg Oral Daily    Continuous Infusions: . sodium chloride 75 mL/hr at 11/08/13 2348    Past Medical History  Diagnosis Date  . Dysmetabolic syndrome X   . Hyperlipidemia   . Other abnormal glucose   . Hypothyroidism   . DIVERTICULOSIS, COLON   . Hyperplastic colon polyp   . FIBROMYALGIA   . HYPERLIPIDEMIA   . HYPERTENSION, ESSENTIAL NOS   . HYPERTROPHIC CARDIOMYOPATHY   . Hyponatremia   . MYCOSIS FUNGOIDES   . OSTEOARTHRITIS   . OSTEOPOROSIS   . RENAL INSUFFICIENCY   . Unspecified vitamin D deficiency   . Nodule of left lung   . Normocytic anemia   . Adrenal myelolipoma   . Renal angiomyolipoma   . Gastric ulcer due to Helicobacter pylori     Past Surgical History  Procedure Laterality  Date  . G 4 p 2    . Colonoscopy with polypectomy  07/25/2007    Dr Olevia Perches, Recheck in 7 years for 2009    Allexis Bordenave MS, [place], [place] Pager [phone] Weekend/After Hours Pager

## 2013-11-10 ENCOUNTER — Inpatient Hospital Stay (HOSPITAL_COMMUNITY): Payer: Medicare Other

## 2013-11-10 LAB — BASIC METABOLIC PANEL
BUN: 12 mg/dL (ref 6–23)
CHLORIDE: 94 meq/L — AB (ref 96–112)
CO2: 21 meq/L (ref 19–32)
Calcium: 9.2 mg/dL (ref 8.4–10.5)
Creatinine, Ser: 0.98 mg/dL (ref 0.50–1.10)
GFR calc Af Amer: 60 mL/min — ABNORMAL LOW (ref >90)
GFR calc non Af Amer: 52 mL/min — ABNORMAL LOW (ref >90)
Glucose, Bld: 87 mg/dL (ref 70–99)
POTASSIUM: 4.7 meq/L (ref 3.7–5.3)
SODIUM: 130 meq/L — AB (ref 137–147)

## 2013-11-10 LAB — CBC
HCT: 31.8 % — ABNORMAL LOW (ref 36.0–46.0)
Hemoglobin: 11.1 g/dL — ABNORMAL LOW (ref 12.0–15.0)
MCH: 29 pg (ref 26.0–34.0)
MCHC: 34.9 g/dL (ref 30.0–36.0)
MCV: 83 fL (ref 78.0–100.0)
PLATELETS: 265 10*3/uL (ref 150–400)
RBC: 3.83 MIL/uL — AB (ref 3.87–5.11)
RDW: 13.2 % (ref 11.5–15.5)
WBC: 8.5 10*3/uL (ref 4.0–10.5)

## 2013-11-10 NOTE — Progress Notes (Signed)
PROGRESS NOTE  [name] WVP:[id number] DOB: 01-Sep-1927 DOA: 11/08/2013 PCP: Unice Cobble, MD  HPI: [name] is a 80 y.o. female with prior h/o hypertension, hypothyroidism, chronic hyponatremia, recently diagnosed h pylori comes in for persistent nausea, and visual hallucinations sicne 2 days. On arrival to ED, she was found to be hyponatremic with sodium of 124 and she also reports stopping the wellbutrin 4 days ago. She is referred to medical service for admission for evaluation and management of the above. She denies any other complaints.   Assessment/Plan:  Hallucinations - ?due to sudden discontinuation of Wellbutrin, will restart, now back on it for 2 days, still hallucinating. - clarithromycin can cause hallucinations also, extremely rare but worth holding for now  - patient on Lorazepam at home, she is unclear on how often she is taking it, ?whether she stopped it few days ago and withdrawing, will schedule low dose.  - consulted psychiatry today to evaluate whether this is due to her medications vs other etiologies  Hyponatremia - this is somewhat chronic - improving with fluid restriction.   H PylorI infection - unfortunately patient's allergies make treatment choices limited. She is on Clarithromycin and Metronidazole.   Hypothyroidism - TSH OK, continue synthroid.   Nausea - improved some this morning  Diet: regular  Fluids: NS at [facility] DVT Prophylaxis: lovenox  Code Status: Full Family Communication: daughter over the phone  Disposition Plan: inpatient   Consultants:  None   Procedures:  None   Antibiotics  Anti-infectives   Start     Dose/Rate Route Frequency Ordered Stop   11/08/13 2359  metroNIDAZOLE (FLAGYL) tablet 500 mg     500 mg Oral 3 times per day 11/08/13 2321     11/08/13 2359  clarithromycin (BIAXIN) tablet 500 mg  Status:  Discontinued     500 mg Oral Every 12 hours 11/08/13 2321 11/09/13 0921     Antibiotics Given (last 72  hours)   Date/Time Action Medication Dose   11/09/13 0835 Given   metroNIDAZOLE (FLAGYL) tablet 500 mg 500 mg   11/09/13 1645 Given   metroNIDAZOLE (FLAGYL) tablet 500 mg 500 mg   11/09/13 2332 Given   metroNIDAZOLE (FLAGYL) tablet 500 mg 500 mg   11/10/13 5462 Given   metroNIDAZOLE (FLAGYL) tablet 500 mg 500 mg     HPI/Subjective: Still complains of ongoing hallucinations, feels that they are better. She does not hear her daughter's voice anymore, and the flowers on the walls are still present but no longer spinning.   Objective: Filed Vitals:   11/09/13 0641 11/09/13 1454 11/09/13 2105 11/10/13 0608  BP: 157/63 132/73 147/76 150/66  Pulse: 64 69 67 64  Temp: 97.8 F (36.6 C) 98.5 F (36.9 C) 97.2 F (36.2 C) 97.7 F (36.5 C)  TempSrc: Oral Oral Oral Oral  Resp: 18 18 16 20   Height:      Weight:      SpO2: 96% 98% 98% 98%    Intake/Output Summary (Last 24 hours) at 11/10/13 7035 Last data filed at 11/10/13 0500  Gross per 24 hour  Intake 1107.67 ml  Output      0 ml  Net 1107.67 ml   Filed Weights   11/08/13 2313  Weight: 64.003 kg (141 lb 1.6 oz)   Exam:  General:  NAD  Cardiovascular: regular rate and rhythm, without MRG  Respiratory: good air movement, clear to auscultation throughout, no wheezing, ronchi or rales  Abdomen: soft, not tender to  palpation, positive bowel sounds  MSK: no peripheral edema  Neuro: non focal  Data Reviewed: Basic Metabolic Panel:  Recent Labs Lab 11/08/13 2100 11/09/13 0520 11/10/13 0521  NA 124* 125* 130*  K 4.6 4.6 4.7  CL 89* 91* 94*  CO2 23 23 21   GLUCOSE 115* 91 87  BUN 13 10 12   CREATININE 0.85 0.84 0.98  CALCIUM 9.1 8.7 9.2   Liver Function Tests:  Recent Labs Lab 11/08/13 2100  AST 16  ALT 15  ALKPHOS 67  BILITOT 0.3  PROT 6.3  ALBUMIN 3.5   CBC:  Recent Labs Lab 11/08/13 2100 11/10/13 0521  WBC 7.6 8.5  NEUTROABS 5.8  --   HGB 10.9* 11.1*  HCT 32.0* 31.8*  MCV 84.2 83.0  PLT 250  265   Studies: No results found.  Scheduled Meds: . aspirin  81 mg Oral q morning - 10a  . buPROPion  100 mg Oral Daily  . enoxaparin (LOVENOX) injection  40 mg Subcutaneous Q24H  . levothyroxine  37.5 mcg Oral Once per day on Tue Sat  . levothyroxine  75 mcg Oral Once per day on Sun Mon Wed Thu Fri  . lisinopril  10 mg Oral Daily  . LORazepam  0.5 mg Oral BID  . metroNIDAZOLE  500 mg Oral 3 times per day  . pantoprazole  40 mg Oral Daily   Continuous Infusions: . sodium chloride 10 mL/hr at 11/09/13 1644   Active Problems:   HYPOTHYROIDISM   HYPERTENSION, ESSENTIAL NOS   Hyponatremia   Nausea alone   Normocytic anemia   Hallucination, visual  Time spent: 25  This note has been created with Surveyor, quantity. Any transcriptional errors are unintentional.   Marzetta Board, MD [facility] Pager [phone]. If 7 PM - 7 AM, please contact night-coverage at [email or web address], password [facility] 11/10/2013, 9:22 AM  LOS: 2 days

## 2013-11-11 DIAGNOSIS — D649 Anemia, unspecified: Secondary | ICD-10-CM

## 2013-11-11 DIAGNOSIS — F329 Major depressive disorder, single episode, unspecified: Secondary | ICD-10-CM

## 2013-11-11 DIAGNOSIS — R443 Hallucinations, unspecified: Secondary | ICD-10-CM

## 2013-11-11 DIAGNOSIS — F3289 Other specified depressive episodes: Secondary | ICD-10-CM

## 2013-11-11 DIAGNOSIS — F341 Dysthymic disorder: Secondary | ICD-10-CM

## 2013-11-11 LAB — BASIC METABOLIC PANEL
BUN: 11 mg/dL (ref 6–23)
CO2: 24 meq/L (ref 19–32)
Calcium: 9 mg/dL (ref 8.4–10.5)
Chloride: 92 mEq/L — ABNORMAL LOW (ref 96–112)
Creatinine, Ser: 1.1 mg/dL (ref 0.50–1.10)
GFR calc Af Amer: 52 mL/min — ABNORMAL LOW (ref >90)
GFR, EST NON AFRICAN AMERICAN: 45 mL/min — AB (ref >90)
GLUCOSE: 88 mg/dL (ref 70–99)
POTASSIUM: 4.3 meq/L (ref 3.7–5.3)
SODIUM: 129 meq/L — AB (ref 137–147)

## 2013-11-11 MED ORDER — PROMETHAZINE HCL 25 MG/ML IJ SOLN: 12.5000 mg | Freq: Four times a day (QID) | INTRAMUSCULAR | Status: DC | PRN

## 2013-11-11 MED ORDER — SACCHAROMYCES BOULARDII 250 MG PO CAPS
250.0000 mg | ORAL_CAPSULE | Freq: Two times a day (BID) | ORAL | Status: DC
Start: 2013-11-11 — End: 2013-11-12
  Administered 2013-11-11 – 2013-11-12 (×3): 250 mg via ORAL
  Filled 2013-11-11 (×4): qty 1

## 2013-11-11 MED ORDER — CLARITHROMYCIN 250 MG PO TABS
250.0000 mg | ORAL_TABLET | Freq: Two times a day (BID) | ORAL | Status: DC
  Administered 2013-11-11 – 2013-11-12 (×2): 250 mg via ORAL
  Filled 2013-11-11 (×3): qty 1

## 2013-11-11 MED ORDER — ONDANSETRON 8 MG PO TBDP
8.0000 mg | ORAL_TABLET | ORAL | Status: DC | PRN
  Administered 2013-11-11 – 2013-11-12 (×6): 8 mg via ORAL
  Filled 2013-11-11 (×6): qty 1

## 2013-11-11 NOTE — Progress Notes (Signed)
Clinical Social Work Department CLINICAL SOCIAL WORK PSYCHIATRY SERVICE LINE ASSESSMENT 11/11/2013  Patient:  [name]  Account:  [id number]  Admit Date:  11/08/2013  Clinical Social Worker:  Sindy Messing, LCSW  Date/Time:  11/11/2013 10:30 AM Referred by:  Physician  Date referred:  11/11/2013 Reason for Referral  Psychosocial assessment   Presenting Symptoms/Problems (In the person's/family's own words):   Psych consulted due to hallucinations.   Abuse/Neglect/Trauma History (check all that apply)  Denies history   Abuse/Neglect/Trauma Comments:   Psychiatric History (check all that apply)  Outpatient treatment   Psychiatric medications:  Wellbutrin 100 mg   Current Mental Health Hospitalizations/Previous Mental Health History:   Patient reports that she was having trouble with her appetite and dtr and MD felt she might be depressed. Patient denies any symptoms of depression but reports that MD prescribed antidepressant medication to assist with symptoms.   Current provider:   Dr. Geraldo Pitter and Date:   [place], [place]   Current Medications:   Scheduled Meds:      . aspirin  81 mg Oral q morning - 10a  . buPROPion  100 mg Oral Daily  . enoxaparin (LOVENOX) injection  40 mg Subcutaneous Q24H  . levothyroxine  37.5 mcg Oral Once per day on Tue Sat  . levothyroxine  75 mcg Oral Once per day on Sun Mon Wed Thu Fri  . lisinopril  10 mg Oral Daily  . LORazepam  0.5 mg Oral BID  . metroNIDAZOLE  500 mg Oral 3 times per day  . pantoprazole  40 mg Oral Daily  . saccharomyces boulardii  250 mg Oral BID        Continuous Infusions:      . sodium chloride 10 mL/hr at 11/09/13 1644          PRN Meds:.acetaminophen, ondansetron, promethazine       Previous Impatient Admission/Date/Reason:   None reported   Emotional Health / Current Symptoms    Suicide/Self Harm  None reported   Suicide attempt in the past:   Patient denies any previous attempts. Patient  denies any SI or HI.   Other harmful behavior:   None reported   Psychotic/Dissociative Symptoms  Visual Hallucinations   Other Psychotic/Dissociative Symptoms:   Patient reports once she stopped taking Wellbutrin that she was experiencing VH such as seeing flowers on the wall. Patient reports that hallucinations have since resolved.    Attention/Behavioral Symptoms  Within Normal Limits   Other Attention / Behavioral Symptoms:   Patient engaged throughout assessment and agreeable to talk with CSW.    Cognitive Impairment  Within Normal Limits   Other Cognitive Impairment:   Patient alert and oriented.    Mood and Adjustment  Mood Congruent    Stress, Anxiety, Trauma, Any Recent Loss/Stressor  Grief/Loss (recent or history)   Anxiety (frequency):   N/A   Phobia (specify):   N/A   Compulsive behavior (specify):   N/A   Obsessive behavior (specify):   N/A   Other:   Patient's husband of 20 years passed away a few years ago. Patient reports that she has handled husband's death well.   Substance Abuse/Use  None   SBIRT completed (please refer for detailed history):  N  Self-reported substance use:   Patient denies all substance use.   Urinary Drug Screen Completed:  N Alcohol level:   N/A    Environmental/Housing/Living Arrangement  Stable housing   Who is in the home:  Alone   Emergency contact:  [place]   Patient's Strengths and Goals (patient's own words):   Patient reports supportive friends and family.   Clinical Social Worker's Interpretive Summary:   CSW received referral in order to complete psychosocial assessment. CSW reviewed chart and met with patient at bedside. CSW introduced myself and explained role.    Patient reports that she came to the hospital due to ulcers. Patient reports that she was having difficulty at home with eating and stomach problems due to ulcers. Patient reports  about 1 month ago she complained to dtr about poor appetite due to medical concerns. Dtr felt that patient's symptoms might be related to depression and encouraged patient to talk with PCP about problems. Patient denies any depression but reports she was agreeable to take antidepressants in order to see if it would assist with appetite.    Patient was prescribed Wellbutrin and reports she took medication for about 3 weeks. Patient states she did not think it was helpful and did not feel depressed so stopped taking medications abruptly. Patient was unaware that she should not stop taking medication without tapering medications and starting experiencing hallucinations. Patient reports that she has never experienced any hallucinations before and the hallucinations have resolved now. Patient reports that she is agreeable to take Wellbutrin again and will take medications as prescribed.    Patient denies any SA. Patient denies any current SI or HI or psychotic features. Patient reports that she does not feel depressed but does agree that medication could help with appetite. CSW and patient spoke about her support system. Patient reports that she has one dtr and one son. Dtr lives nearby but son lives in [place]. Patient reports that she has a few good friends that she goes shopping with or plays cards. Patient reports that she is not involved with any social groups or church but would be interested in some activities. CSW spoke with patient about Tax adviser and patient reports she would like more information. CSW provided referral and placed information on AVS as well.    Patient alert and oriented during assessment. Patient has appropriate affect and engaged during assessment. Patient does not feel she needs further psych follow up but thanked CSW for concern. CSW will continue to follow and will assist with any further recommendations provided by psych MD.   Disposition:  Recommend Psych  CSW continuing to support while in hospital   [place], [place] [phone]

## 2013-11-11 NOTE — Consult Note (Signed)
[facility] Face-to-Face Psychiatry Consult   Reason for Consult:  Visual hallucinations Referring Physician:  Dr. Kayren Eaves is an 80 y.o. female. Total Time spent with patient: 45 minutes  Assessment: AXIS I:  Major Depression, single episode AXIS II:  Deferred AXIS III:   Past Medical History  Diagnosis Date  . Dysmetabolic syndrome X   . Hyperlipidemia   . Other abnormal glucose   . Hypothyroidism   . DIVERTICULOSIS, COLON   . Hyperplastic colon polyp   . FIBROMYALGIA   . HYPERLIPIDEMIA   . HYPERTENSION, ESSENTIAL NOS   . HYPERTROPHIC CARDIOMYOPATHY   . Hyponatremia   . MYCOSIS FUNGOIDES   . OSTEOARTHRITIS   . OSTEOPOROSIS   . RENAL INSUFFICIENCY   . Unspecified vitamin D deficiency   . Nodule of left lung   . Normocytic anemia   . Adrenal myelolipoma   . Renal angiomyolipoma   . Gastric ulcer due to Helicobacter pylori    AXIS IV:  other psychosocial or environmental problems and recent discontinuation of medication  AXIS V:  51-60 moderate symptoms  Plan:  Paged Dr. Cruzita Lederer No evidence of imminent risk to self or others at present.   Patient does not meet criteria for psychiatric inpatient admission. Supportive therapy provided about ongoing stressors. Will be referred to out patient treatment when medically cleared.  Appreciate psychiatric consultation Please contact [phone] if needs further assistance  Subjective:   JESSICAMARIE AMIRI is a 80 y.o. female patient admitted with hallucinations.  HPI:  Patient is seen for psychiatric consultation and examination and case discussed with patient and psychiatric case manager. Patient has stated that she was experiencing visual hallucinations like flowers on the wall going round and round which she never experienced. She also complaint about stomach acid and bacteria etc. She stated that her primary doctor and daughter thought she was depressed when she was not eating well and given a trial of wellbutrin which  she stopped after taking 20 days because she felt that she was not depressed. She started feeling bad, sick and hallucination which resulted coming to the hospital and than admitted. She states that she knows that she suppose not to stop her medication without supervision and recommendation from her physician. She denied symptoms of depression and anxiety. She has no visual hallucination since her medication is restarted in hospital. She was in agreement to continue to take medication until further recommendation given to her. She denied suicidal or homicidal ideation, intention or plans. She has no safety concerns.   HPI Elements:   Location:  hallucination. Quality:  fair. Severity:  mild. Timing:  dicontinuation of medication.  Past Psychiatric History: Past Medical History  Diagnosis Date  . Dysmetabolic syndrome X   . Hyperlipidemia   . Other abnormal glucose   . Hypothyroidism   . DIVERTICULOSIS, COLON   . Hyperplastic colon polyp   . FIBROMYALGIA   . HYPERLIPIDEMIA   . HYPERTENSION, ESSENTIAL NOS   . HYPERTROPHIC CARDIOMYOPATHY   . Hyponatremia   . MYCOSIS FUNGOIDES   . OSTEOARTHRITIS   . OSTEOPOROSIS   . RENAL INSUFFICIENCY   . Unspecified vitamin D deficiency   . Nodule of left lung   . Normocytic anemia   . Adrenal myelolipoma   . Renal angiomyolipoma   . Gastric ulcer due to Helicobacter pylori     reports that she quit smoking about 50 years ago. She has never used smokeless tobacco. She reports that she does not drink  alcohol or use illicit drugs. Family History  Problem Relation Age of Onset  . Diabetes Mother   . Heart attack Maternal Uncle 30    his 2 sons had MI in 60s  . Hypertension Maternal Grandmother   . Stroke Maternal Grandmother 69  . Cancer Neg Hx   . Asthma Neg Hx   . COPD Neg Hx      Living Arrangements: Alone   Abuse/Neglect [facility]) Physical Abuse: Denies Verbal Abuse: Denies Sexual Abuse: Denies Allergies:   Allergies  Allergen  Reactions  . Achromycin [Tetracycline Hcl]     Facial swelling   . Omnipen [Ampicillin]     Facial swelling   . Simvastatin Other (See Comments)    12/15/12 myalgias  . Celecoxib Other (See Comments)    REACTION: bleeding  . Ezetimibe-Simvastatin Diarrhea, Nausea And Vomiting and Other (See Comments)    REACTION: myalgia  . Abilify [Aripiprazole]     2/15 prescribed by psychiatry for depression along with Wellbutrin. This caused insomnia    ACT Assessment Complete:  NO Objective: Blood pressure 147/76, pulse 57, temperature 97.6 F (36.4 C), temperature source Oral, resp. rate 18, height _0  (1.448 m), weight 64.003 kg (141 lb 1.6 oz), SpO2 97.00%.Body mass index is 30.53 kg/(m^2). Results for orders placed during the hospital encounter of 11/08/13 (from the past 72 hour(s))  CBC WITH DIFFERENTIAL     Status: Abnormal   Collection Time    11/08/13  9:00 PM      Result Value Ref Range   WBC 7.6  4.0 - 10.5 K/uL   RBC 3.80 (*) 3.87 - 5.11 MIL/uL   Hemoglobin 10.9 (*) 12.0 - 15.0 g/dL   HCT 32.0 (*) 36.0 - 46.0 %   MCV 84.2  78.0 - 100.0 fL   MCH 28.7  26.0 - 34.0 pg   MCHC 34.1  30.0 - 36.0 g/dL   RDW 12.9  11.5 - 15.5 %   Platelets 250  150 - 400 K/uL   Neutrophils Relative % 75  43 - 77 %   Neutro Abs 5.8  1.7 - 7.7 K/uL   Lymphocytes Relative 14  12 - 46 %   Lymphs Abs 1.0  0.7 - 4.0 K/uL   Monocytes Relative 11  3 - 12 %   Monocytes Absolute 0.8  0.1 - 1.0 K/uL   Eosinophils Relative 0  0 - 5 %   Eosinophils Absolute 0.0  0.0 - 0.7 K/uL   Basophils Relative 0  0 - 1 %   Basophils Absolute 0.0  0.0 - 0.1 K/uL  COMPREHENSIVE METABOLIC PANEL     Status: Abnormal   Collection Time    11/08/13  9:00 PM      Result Value Ref Range   Sodium 124 (*) 137 - 147 mEq/L   Potassium 4.6  3.7 - 5.3 mEq/L   Chloride 89 (*) 96 - 112 mEq/L   CO2 23  19 - 32 mEq/L   Glucose, Bld 115 (*) 70 - 99 mg/dL   BUN 13  6 - 23 mg/dL   Creatinine, Ser 0.85  0.50 - 1.10 mg/dL   Calcium 9.1   8.4 - 10.5 mg/dL   Total Protein 6.3  6.0 - 8.3 g/dL   Albumin 3.5  3.5 - 5.2 g/dL   AST 16  0 - 37 U/L   ALT 15  0 - 35 U/L   Alkaline Phosphatase 67  39 - 117 U/L   Total  Bilirubin 0.3  0.3 - 1.2 mg/dL   GFR calc non Af Amer 62 (*) >90 mL/min   GFR calc Af Amer 71 (*) >90 mL/min   Comment: (NOTE)     The eGFR has been calculated using the CKD EPI equation.     This calculation has not been validated in all clinical situations.     eGFR's persistently <90 mL/min signify possible Chronic Kidney     Disease.  URINALYSIS, ROUTINE W REFLEX MICROSCOPIC     Status: Abnormal   Collection Time    11/08/13  9:23 PM      Result Value Ref Range   Color, Urine YELLOW  YELLOW   APPearance CLEAR  CLEAR   Specific Gravity, Urine 1.008  1.005 - 1.030   pH 6.5  5.0 - 8.0   Glucose, UA NEGATIVE  NEGATIVE mg/dL   Hgb urine dipstick NEGATIVE  NEGATIVE   Bilirubin Urine NEGATIVE  NEGATIVE   Ketones, ur NEGATIVE  NEGATIVE mg/dL   Protein, ur NEGATIVE  NEGATIVE mg/dL   Urobilinogen, UA 0.2  0.0 - 1.0 mg/dL   Nitrite NEGATIVE  NEGATIVE   Leukocytes, UA TRACE (*) NEGATIVE  URINE CULTURE     Status: None   Collection Time    11/08/13  9:23 PM      Result Value Ref Range   Specimen Description URINE, CLEAN CATCH     Special Requests NONE     Culture  Setup Time       Value: 11/09/2013 03:16     Performed at [facility] Count       Value: 30,000 COLONIES/ML     Performed at [facility]   Culture       Value: Multiple bacterial morphotypes present, none predominant. Suggest appropriate recollection if clinically indicated.     Performed at [facility]   Report Status 11/09/2013 FINAL    URINE MICROSCOPIC-ADD ON     Status: None   Collection Time    11/08/13  9:23 PM      Result Value Ref Range   Squamous Epithelial / LPF RARE  RARE   WBC, UA 3-6  <3 WBC/hpf  OSMOLALITY, URINE     Status: Abnormal   Collection Time    11/09/13  2:33 AM      Result Value  Ref Range   Osmolality, Ur 189 (*) 390 - 1090 mOsm/kg   Comment: Performed at [facility]  SODIUM, URINE, RANDOM     Status: None   Collection Time    11/09/13  2:33 AM      Result Value Ref Range   Sodium, Ur 51     Comment: Performed at [place]     Status: Abnormal   Collection Time    11/09/13  5:20 AM      Result Value Ref Range   Osmolality 259 (*) 275 - 300 mOsm/kg   Comment: Performed at [facility]  TSH     Status: None   Collection Time    11/09/13  5:20 AM      Result Value Ref Range   TSH 4.450  0.350 - 4.500 uIU/mL   Comment: Performed at [place] PANEL     Status: Abnormal   Collection Time    11/09/13  5:20 AM      Result Value Ref Range   Sodium 125 (*) 137 - 147  mEq/L   Potassium 4.6  3.7 - 5.3 mEq/L   Chloride 91 (*) 96 - 112 mEq/L   CO2 23  19 - 32 mEq/L   Glucose, Bld 91  70 - 99 mg/dL   BUN 10  6 - 23 mg/dL   Creatinine, Ser 0.84  0.50 - 1.10 mg/dL   Calcium 8.7  8.4 - 10.5 mg/dL   GFR calc non Af Amer 63 (*) >90 mL/min   GFR calc Af Amer 72 (*) >90 mL/min   Comment: (NOTE)     The eGFR has been calculated using the CKD EPI equation.     This calculation has not been validated in all clinical situations.     eGFR's persistently <90 mL/min signify possible Chronic Kidney     Disease.  CBC     Status: Abnormal   Collection Time    11/10/13  5:21 AM      Result Value Ref Range   WBC 8.5  4.0 - 10.5 K/uL   RBC 3.83 (*) 3.87 - 5.11 MIL/uL   Hemoglobin 11.1 (*) 12.0 - 15.0 g/dL   HCT 31.8 (*) 36.0 - 46.0 %   MCV 83.0  78.0 - 100.0 fL   MCH 29.0  26.0 - 34.0 pg   MCHC 34.9  30.0 - 36.0 g/dL   RDW 13.2  11.5 - 15.5 %   Platelets 265  150 - 400 K/uL  BASIC METABOLIC PANEL     Status: Abnormal   Collection Time    11/10/13  5:21 AM      Result Value Ref Range   Sodium 130 (*) 137 - 147 mEq/L   Potassium 4.7  3.7 - 5.3 mEq/L   Chloride 94 (*) 96 - 112 mEq/L   CO2 21  19 - 32  mEq/L   Glucose, Bld 87  70 - 99 mg/dL   BUN 12  6 - 23 mg/dL   Creatinine, Ser 0.98  0.50 - 1.10 mg/dL   Calcium 9.2  8.4 - 10.5 mg/dL   GFR calc non Af Amer 52 (*) >90 mL/min   GFR calc Af Amer 60 (*) >90 mL/min   Comment: (NOTE)     The eGFR has been calculated using the CKD EPI equation.     This calculation has not been validated in all clinical situations.     eGFR's persistently <90 mL/min signify possible Chronic Kidney     Disease.  BASIC METABOLIC PANEL     Status: Abnormal   Collection Time    11/11/13  4:44 AM      Result Value Ref Range   Sodium 129 (*) 137 - 147 mEq/L   Potassium 4.3  3.7 - 5.3 mEq/L   Chloride 92 (*) 96 - 112 mEq/L   CO2 24  19 - 32 mEq/L   Glucose, Bld 88  70 - 99 mg/dL   BUN 11  6 - 23 mg/dL   Creatinine, Ser 1.10  0.50 - 1.10 mg/dL   Calcium 9.0  8.4 - 10.5 mg/dL   GFR calc non Af Amer 45 (*) >90 mL/min   GFR calc Af Amer 52 (*) >90 mL/min   Comment: (NOTE)     The eGFR has been calculated using the CKD EPI equation.     This calculation has not been validated in all clinical situations.     eGFR's persistently <90 mL/min signify possible Chronic Kidney     Disease.   Labs are reviewed and  are pertinent for hyponatremia and hypochloremia.  Current Facility-Administered Medications  Medication Dose Route Frequency Provider Last Rate Last Dose  . 0.9 %  sodium chloride infusion   Intravenous Continuous Caren Griffins, MD 10 mL/hr at 11/09/13 1644    . acetaminophen (TYLENOL) tablet 650 mg  650 mg Oral Q4H PRN Hosie Poisson, MD   650 mg at 11/10/13 2114  . aspirin chewable tablet 81 mg  81 mg Oral q morning - 10a Hosie Poisson, MD   81 mg at 11/11/13 1005  . buPROPion (WELLBUTRIN SR) 12 hr tablet 100 mg  100 mg Oral Daily Hosie Poisson, MD   100 mg at 11/11/13 1005  . enoxaparin (LOVENOX) injection 40 mg  40 mg Subcutaneous Q24H Hosie Poisson, MD   40 mg at 11/11/13 1004  . levothyroxine (SYNTHROID, LEVOTHROID) tablet 37.5 mcg  37.5 mcg Oral Once  per day on Tue Sat Hosie Poisson, MD   37.5 mcg at 11/10/13 0806  . levothyroxine (SYNTHROID, LEVOTHROID) tablet 75 mcg  75 mcg Oral Once per day on Sun Mon Wed Thu Fri Hosie Poisson, MD   75 mcg at 11/11/13 0744  . lisinopril (PRINIVIL,ZESTRIL) tablet 10 mg  10 mg Oral Daily Hosie Poisson, MD   10 mg at 11/11/13 1005  . LORazepam (ATIVAN) tablet 0.5 mg  0.5 mg Oral BID Caren Griffins, MD   0.5 mg at 11/11/13 1005  . metroNIDAZOLE (FLAGYL) tablet 500 mg  500 mg Oral 3 times per day Hosie Poisson, MD   500 mg at 11/11/13 0744  . ondansetron (ZOFRAN-ODT) disintegrating tablet 8 mg  8 mg Oral Q4H PRN Theodis Blaze, MD   8 mg at 11/11/13 0744  . pantoprazole (PROTONIX) EC tablet 40 mg  40 mg Oral Daily Hosie Poisson, MD   40 mg at 11/11/13 1005  . promethazine (PHENERGAN) injection 12.5 mg  12.5 mg Intravenous Q6H PRN Theodis Blaze, MD      . saccharomyces boulardii [facility]) capsule 250 mg  250 mg Oral BID Theodis Blaze, MD   250 mg at 11/11/13 1005    Psychiatric Specialty Exam: Physical Exam  Review of Systems  Psychiatric/Behavioral: Positive for depression and hallucinations.  All other systems reviewed and are negative.   Blood pressure 147/76, pulse 57, temperature 97.6 F (36.4 C), temperature source Oral, resp. rate 18, height _0  (1.448 m), weight 64.003 kg (141 lb 1.6 oz), SpO2 97.00%.Body mass index is 30.53 kg/(m^2).  General Appearance: Casual  Eye Contact::  Good  Speech:  Clear and Coherent  Volume:  Normal  Mood:  Depressed  Affect:  Appropriate and Congruent  Thought Process:  Coherent and Goal Directed  Orientation:  Full (Time, Place, and Person)  Thought Content:  WDL  Suicidal Thoughts:  No  Homicidal Thoughts:  No  Memory:  Immediate;   Fair  Judgement:  Intact  Insight:  Good  Psychomotor Activity:  Decreased  Concentration:  Good  Recall:  Good  Fund of Knowledge:Good  Language: Good  Akathisia:  NA  Handed:  Right  AIMS (if indicated):     Assets:   Communication Skills Desire for Improvement Financial Resources/Insurance Housing Leisure Time [place] Talents/Skills Transportation  Sleep:      Musculoskeletal: Strength & Muscle Tone: within normal limits Gait & Station: normal Patient leans: N/A  Treatment Plan Summary: Daily contact with patient to assess and evaluate symptoms and progress in treatment Medication management  Mazzie Brodrick,JANARDHAHA R.  11/11/2013 11:35 AM

## 2013-11-11 NOTE — Progress Notes (Addendum)
Patient ID: [name], female   DOB: [birth year], 80 y.o.   MRN: [id number]  [facility] PROGRESS NOTE  [name]:[id number] DOB: 03-05-1928 DOA: 11/08/2013 PCP: Unice Cobble, MD  Brief narrative: 80 y.o. female with prior h/o hypertension, hypothyroidism, chronic hyponatremia, recently diagnosed with h. pylori came in for persistent nausea, and visual hallucinations for 2 days. On arrival to ED, she was found to be hyponatremic with sodium of 124 and she also reported stopping the wellbutrin 4 days ago. She was referred to medical service for admission for evaluation and management.  Assessment/Plan:  Hallucinations - ? due to sudden discontinuation of Wellbutrin, it was restarted and psych consulted - no hallucinations this AM and pt reports feeling better  - clarithromycin can cause hallucinations also, extremely rare, held for two days and will restart today  - patient on Lorazepam at home, she is unclear on how often she is taking it, ? whether she stopped it few days ago and withdrawing Hyponatremia - this is somewhat chronic, improving with fluid restriction.  H PylorI infection - unfortunately patient's allergies make treatment choices limited. She is on Clarithromycin and Metronidazole.  Hypothyroidism - TSH OK, continue synthroid.  Nausea - improved some this morning   Diet: regular  Fluids: NS at [facility]  DVT Prophylaxis: lovenox   Code Status: Full  Family Communication: daughter at bedside  Disposition Plan: inpatient, possibly home in AM, PT eval pending    Consultants:  Psych Procedures/Studies:  Ct Head Wo Contrast  11/10/2013  No acute intracranial abnormalities. Mild chronic microvascular ischemic change.    Antibiotics:  Flagyl   Clarithromycin   HPI/Subjective: No events overnight.   Objective: Filed Vitals:   11/10/13 0608 11/10/13 1400 11/10/13 2153 11/11/13 0549  BP: 150/66 137/73 150/70 147/76  Pulse: 64 70 72 57  Temp: 97.7 F  (36.5 C) 97.5 F (36.4 C) 98.3 F (36.8 C) 97.6 F (36.4 C)  TempSrc: Oral Oral Oral Oral  Resp: 20 18 18 18   Height:      Weight:      SpO2: 98% 98% 98% 97%    Intake/Output Summary (Last 24 hours) at 11/11/13 0713 Last data filed at 11/11/13 0615  Gross per 24 hour  Intake  732.5 ml  Output      0 ml  Net  732.5 ml    Exam:   General:  Pt is alert, follows commands appropriately, not in acute distress  Cardiovascular: Regular rate and rhythm, no rubs, no gallops  Respiratory: Clear to auscultation bilaterally, no wheezing, no crackles, no rhonchi  Abdomen: Soft, non tender, non distended, bowel sounds present, no guarding  Extremities: No edema, pulses DP and PT palpable bilaterally  Neuro: Grossly nonfocal  Data Reviewed: Basic Metabolic Panel:  Recent Labs Lab 11/08/13 2100 11/09/13 0520 11/10/13 0521 11/11/13 0444  NA 124* 125* 130* 129*  K 4.6 4.6 4.7 4.3  CL 89* 91* 94* 92*  CO2 23 23 21 24   GLUCOSE 115* 91 87 88  BUN 13 10 12 11   CREATININE 0.85 0.84 0.98 1.10  CALCIUM 9.1 8.7 9.2 9.0   Liver Function Tests:  Recent Labs Lab 11/08/13 2100  AST 16  ALT 15  ALKPHOS 67  BILITOT 0.3  PROT 6.3  ALBUMIN 3.5   CBC:  Recent Labs Lab 11/08/13 2100 11/10/13 0521  WBC 7.6 8.5  NEUTROABS 5.8  --   HGB 10.9* 11.1*  HCT 32.0* 31.8*  MCV 84.2 83.0  PLT  250 265     Recent Results (from the past 240 hour(s))  URINE CULTURE     Status: None   Collection Time    11/08/13  9:23 PM      Result Value Ref Range Status   Specimen Description URINE, CLEAN CATCH   Final   Special Requests NONE   Final   Culture  Setup Time     Final   Value: 11/09/2013 03:16     Performed at [facility]     Final   Value: 30,000 COLONIES/ML     Performed at [facility]   Culture     Final   Value: Multiple bacterial morphotypes present, none predominant. Suggest appropriate recollection if clinically indicated.     Performed  at [facility]   Report Status 11/09/2013 FINAL   Final     Scheduled Meds: . aspirin  81 mg Oral q morning - 10a  . buPROPion  100 mg Oral Daily  . enoxaparin (LOVENOX) injection  40 mg Subcutaneous Q24H  . levothyroxine  37.5 mcg Oral Once per day on Tue Sat  . levothyroxine  75 mcg Oral Once per day on Sun Mon Wed Thu Fri  . lisinopril  10 mg Oral Daily  . LORazepam  0.5 mg Oral BID  . metroNIDAZOLE  500 mg Oral 3 times per day  . pantoprazole  40 mg Oral Daily   Continuous Infusions: . sodium chloride 10 mL/hr at 11/09/13 1644    Faye Ramsay, MD  [place] Pager [phone]  If 7PM-7AM, please contact night-coverage [email or web address] Password [email or web address] 11/11/2013, 7:13 AM   LOS: 3 days

## 2013-11-12 DIAGNOSIS — K5730 Diverticulosis of large intestine without perforation or abscess without bleeding: Secondary | ICD-10-CM

## 2013-11-12 LAB — CBC
HCT: 32.1 % — ABNORMAL LOW (ref 36.0–46.0)
HEMOGLOBIN: 11 g/dL — AB (ref 12.0–15.0)
MCH: 28.9 pg (ref 26.0–34.0)
MCHC: 34.3 g/dL (ref 30.0–36.0)
MCV: 84.5 fL (ref 78.0–100.0)
PLATELETS: 242 10*3/uL (ref 150–400)
RBC: 3.8 MIL/uL — AB (ref 3.87–5.11)
RDW: 13.3 % (ref 11.5–15.5)
WBC: 6.6 10*3/uL (ref 4.0–10.5)

## 2013-11-12 LAB — BASIC METABOLIC PANEL
BUN: 12 mg/dL (ref 6–23)
CO2: 23 meq/L (ref 19–32)
Calcium: 8.9 mg/dL (ref 8.4–10.5)
Chloride: 91 mEq/L — ABNORMAL LOW (ref 96–112)
Creatinine, Ser: 0.99 mg/dL (ref 0.50–1.10)
GFR calc Af Amer: 59 mL/min — ABNORMAL LOW (ref >90)
GFR calc non Af Amer: 51 mL/min — ABNORMAL LOW (ref >90)
GLUCOSE: 95 mg/dL (ref 70–99)
Potassium: 4.3 mEq/L (ref 3.7–5.3)
SODIUM: 127 meq/L — AB (ref 137–147)

## 2013-11-12 MED ORDER — CLARITHROMYCIN 250 MG PO TABS: 250.0000 mg | ORAL_TABLET | Freq: Two times a day (BID) | ORAL | Status: DC

## 2013-11-12 MED ORDER — SACCHAROMYCES BOULARDII 250 MG PO CAPS: 250.0000 mg | ORAL_CAPSULE | Freq: Two times a day (BID) | ORAL | Status: DC

## 2013-11-12 MED ORDER — ONDANSETRON 8 MG PO TBDP: 8.0000 mg | ORAL_TABLET | ORAL | Status: DC | PRN

## 2013-11-12 MED ORDER — METRONIDAZOLE 500 MG PO TABS: ORAL_TABLET | ORAL | Status: DC

## 2013-11-12 NOTE — Progress Notes (Signed)
Discharge instructions given to pt/daughter, verbalized understanding. Left the unit in stable condition.

## 2013-11-12 NOTE — Discharge Summary (Signed)
Physician Discharge Summary  [name] NTZ:[id number] DOB: [birth year] DOA: 11/08/2013  PCP: Unice Cobble, MD  Admit date: 11/08/2013 Discharge date: 11/12/2013  Recommendations for Outpatient Follow-up:  1. Pt will need to follow up with PCP in 2-3 weeks post discharge 2. Please obtain BMP to evaluate electrolytes and kidney function 3. Please also check CBC to evaluate Hg and Hct levels 4. Please note that pt was discharged on Flagyl and Clarithromycin as noted below   Discharge Diagnoses: hallucinations  Active Problems:   HYPOTHYROIDISM   HYPERTENSION, ESSENTIAL NOS   Hyponatremia   Nausea alone   Normocytic anemia   Hallucination, visual  Discharge Condition: Stable  Diet recommendation: Heart healthy diet discussed in details   Brief narrative:  80 y.o. female with prior h/o hypertension, hypothyroidism, chronic hyponatremia, recently diagnosed with h. pylori came in for persistent nausea, and visual hallucinations for 2 days. On arrival to ED, she was found to be hyponatremic with sodium of 124 and she also reported stopping the wellbutrin 4 days ago. She was referred to medical service for admission for evaluation and management.   Assessment/Plan:  Hallucinations - ? due to sudden discontinuation of Wellbutrin, it was restarted and psych consulted  - no hallucinations this AM and pt reports feeling better  - clarithromycin can cause hallucinations also, extremely rare, held for two days and restarted 24 hours prior to this admission  - patient on Lorazepam at home, she is unclear on how often she is taking it,  Hyponatremia - this is somewhat chronic, improving with fluid restriction.  H PylorI infection - unfortunately patient's allergies make treatment choices limited. She is on Clarithromycin and Metronidazole.  Hypothyroidism - TSH OK, continue synthroid.  Nausea - improved with Zofran    Code Status: Full  Family Communication: daughter at bedside    Consultants:   Psych Procedures/Studies:   Ct Head Wo Contrast 11/10/2013 No acute intracranial abnormalities. Mild chronic microvascular ischemic change.  Antibiotics:   Flagyl   Clarithromycin    Discharge Exam: Filed Vitals:   11/12/13 0648  BP: 146/55  Pulse: 68  Temp: 97.6 F (36.4 C)  Resp: 18   Filed Vitals:   11/11/13 0549 11/11/13 1421 11/11/13 2230 11/12/13 0648  BP: 147/76 148/75 145/53 146/55  Pulse: 57 67 62 68  Temp: 97.6 F (36.4 C) 97.7 F (36.5 C) 97.5 F (36.4 C) 97.6 F (36.4 C)  TempSrc: Oral Oral Oral Oral  Resp: 18 18 18 18   Height:      Weight:      SpO2: 97% 97% 98% 97%    General: Pt is alert, follows commands appropriately, not in acute distress Cardiovascular: Regular rate and rhythm, S1/S2 +, no murmurs, no rubs, no gallops Respiratory: Clear to auscultation bilaterally, no wheezing, no crackles, no rhonchi Abdominal: Soft, non tender, non distended, bowel sounds +, no guarding Extremities: no edema, no cyanosis, pulses palpable bilaterally DP and PT Neuro: Grossly nonfocal  Discharge Instructions     Medication List         acetaminophen 325 MG tablet  Commonly known as:  TYLENOL  Take 650 mg by mouth 3 (three) times daily.     aspirin 81 MG chewable tablet  Chew 81 mg by mouth every morning.     beta carotene w/minerals tablet  Take 1 tablet by mouth every morning.     buPROPion 100 MG 12 hr tablet  Commonly known as:  WELLBUTRIN SR  Take 100 mg by mouth  daily.     cholecalciferol 1000 UNITS tablet  Commonly known as:  VITAMIN D  Take 2,000 Units by mouth every morning.     clarithromycin 250 MG tablet  Commonly known as:  BIAXIN  Take 1 tablet (250 mg total) by mouth every 12 (twelve) hours.     levothyroxine 75 MCG tablet  Commonly known as:  SYNTHROID, LEVOTHROID  Take 37.5-75 mcg by mouth daily before breakfast. Take half of a tablet on Tuesdays and Saturdays. On all other days take 1 tablet      lisinopril 20 MG tablet  Commonly known as:  PRINIVIL,ZESTRIL  Take 10 mg by mouth daily. Take one half tablet     LORazepam 0.5 MG tablet  Commonly known as:  ATIVAN  Take 0.5 mg by mouth every 8 (eight) hours as needed for anxiety.     metroNIDAZOLE 500 MG tablet  Commonly known as:  FLAGYL  Take one po TID x 7 days     omeprazole 20 MG capsule  Commonly known as:  PRILOSEC  Take one capsule BID x 10 days then take one po daily     ondansetron 4 MG tablet  Commonly known as:  ZOFRAN  Take 1 tablet (4 mg total) by mouth every 8 (eight) hours as needed for nausea or vomiting.     ondansetron 8 MG disintegrating tablet  Commonly known as:  ZOFRAN-ODT  Take 1 tablet (8 mg total) by mouth every 4 (four) hours as needed for nausea or vomiting.     saccharomyces boulardii 250 MG capsule  Commonly known as:  FLORASTOR  Take 1 capsule (250 mg total) by mouth 2 (two) times daily.           Follow-up Information   Call [facility]. (If interested in senior activites in the community)    Contact information:   [address] [place], [place] [address] [phone]      Schedule an appointment as soon as possible for a visit with Unice Cobble, MD.   Specialty:  Internal Medicine   Contact information:   [address] [address] [phone]       Follow up with Faye Ramsay, MD. (As needed, If symptoms worsen, call my cell phone [phone])    Specialty:  Internal Medicine   Contact information:   [address] [place] [address] [phone]        The results of significant diagnostics from this hospitalization (including imaging, microbiology, ancillary and laboratory) are listed below for reference.     Microbiology: Recent Results (from the past 240 hour(s))  URINE CULTURE     Status: None   Collection Time    11/08/13  9:23 PM      Result Value Ref Range Status   Specimen Description URINE, CLEAN CATCH    Final   Special Requests NONE   Final   Culture  Setup Time     Final   Value: 11/09/2013 03:16     Performed at [facility]     Final   Value: 30,000 COLONIES/ML     Performed at [facility]   Culture     Final   Value: Multiple bacterial morphotypes present, none predominant. Suggest appropriate recollection if clinically indicated.     Performed at [facility]   Report Status 11/09/2013 FINAL   Final     Labs: Basic Metabolic Panel:  Recent  Labs Lab 11/08/13 2100 11/09/13 0520 11/10/13 0521 11/11/13 0444 11/12/13 0445  NA 124* 125* 130* 129* 127*  K 4.6 4.6 4.7 4.3 4.3  CL 89* 91* 94* 92* 91*  CO2 23 23 21 24 23   GLUCOSE 115* 91 87 88 95  BUN 13 10 12 11 12   CREATININE 0.85 0.84 0.98 1.10 0.99  CALCIUM 9.1 8.7 9.2 9.0 8.9   Liver Function Tests:  Recent Labs Lab 11/08/13 2100  AST 16  ALT 15  ALKPHOS 67  BILITOT 0.3  PROT 6.3  ALBUMIN 3.5   No results found for this basename: LIPASE, AMYLASE,  in the last 168 hours No results found for this basename: AMMONIA,  in the last 168 hours CBC:  Recent Labs Lab 11/08/13 2100 11/10/13 0521 11/12/13 0445  WBC 7.6 8.5 6.6  NEUTROABS 5.8  --   --   HGB 10.9* 11.1* 11.0*  HCT 32.0* 31.8* 32.1*  MCV 84.2 83.0 84.5  PLT 250 265 242   Cardiac Enzymes: No results found for this basename: CKTOTAL, CKMB, CKMBINDEX, TROPONINI,  in the last 168 hours BNP: BNP (last 3 results) No results found for this basename: PROBNP,  in the last 8760 hours CBG: No results found for this basename: GLUCAP,  in the last 168 hours   SIGNED: Time coordinating discharge: Over 30 minutes  Faye Ramsay, MD  [facility] 11/12/2013, 10:14 AM Pager [phone]  If 7PM-7AM, please contact night-coverage [email or web address] Password [email or web address]

## 2013-11-12 NOTE — Evaluation (Signed)
Physical Therapy Evaluation Patient Details Name: [name] MRN: [id number] DOB: [birth year] Today's Date: 11/12/2013   History of Present Illness  80 yo female admitted with hyponatremia, visual hallucinations. Hx of fibromyalgia, HTN, Oa, chronic hyponatremia, H pylori.  Pt lives alone.   Clinical Impression  On eval, pt was supervision level assist-able to ambulate ~300 feet without assistive device. Intermittent stumbling noted but no LOB. Recommend home safety evaluation by PT to assess safety of mobility in home environment and for possible balance training, if needed. 1x eval. Will sign off.     Follow Up Recommendations Home health PT    Equipment Recommendations  None recommended by PT    Recommendations for Other Services       Precautions / Restrictions Restrictions Weight Bearing Restrictions: No      Mobility  Bed Mobility Overal bed mobility: Modified Independent                Transfers Overall transfer level: Modified independent                  Ambulation/Gait Ambulation/Gait assistance: Supervision Ambulation Distance (Feet): 300 Feet Assistive device: None       General Gait Details: Intermittent stumbling but no LOB. Pt tolerated distance well.   Stairs Stairs: Yes Stairs assistance: Min guard Stair Management: Alternating pattern;Forwards;One rail Left Number of Stairs: 4 General stair comments: close guard for safety.   Wheelchair Mobility    Modified Rankin (Stroke Patients Only)       Balance Overall balance assessment: History of Falls   Sitting balance-Leahy Scale: Normal       Standing balance-Leahy Scale: Good Standing balance comment: had pt perform static standing with EO/EC, narrow BOS, and while withstanding external perturbations-no difficulty with these tasks.              High level balance activites: Backward walking;Direction changes;Turns High Level Balance Comments: 360 degree turn, pick  up object, step-to x 8 while tapping foot on trashcan-increased time to compete turn and tapping but no LOB.              Pertinent Vitals/Pain No c/o pain    Home Living Family/patient expects to be discharged to:: Private residence Living Arrangements: Alone   Type of Home: House Home Access: Stairs to enter Entrance Stairs-Rails: Right Entrance Stairs-Number of Steps: 3 Home Layout: One level Home Equipment: None      Prior Function Level of Independence: Independent               Hand Dominance        Extremity/Trunk Assessment   Upper Extremity Assessment: Overall WFL for tasks assessed           Lower Extremity Assessment: Overall WFL for tasks assessed      Cervical / Trunk Assessment: Normal  Communication   Communication: No difficulties  Cognition Arousal/Alertness: Awake/alert Behavior During Therapy: WFL for tasks assessed/performed Overall Cognitive Status: Within Functional Limits for tasks assessed                      General Comments      Exercises        Assessment/Plan    PT Assessment Patient needs continued PT services  PT Diagnosis Generalized weakness   PT Problem List Decreased strength;Decreased balance  PT Treatment Interventions     PT Goals (Current goals can be found in the Care Plan section) Acute Rehab PT Goals Patient  Stated Goal: home. to be able to eat more. get rid of nausea PT Goal Formulation: No goals set, d/c therapy    Frequency     Barriers to discharge        Co-evaluation               End of Session Equipment Utilized During Treatment: Gait belt Activity Tolerance: Patient tolerated treatment well Patient left: in bed;with call bell/phone within reach           Time: 1114-1139 PT Time Calculation (min): 25 min   Charges:   PT Evaluation $Initial PT Evaluation Tier I: 1 Procedure PT Treatments $Gait Training: 23-37 mins   PT G Codes:          Weston Anna, MPT Pager: [phone]

## 2013-11-12 NOTE — Discharge Instructions (Signed)
Helicobacter Pylori Antibodies Test °This is a blood test which looks for a germ called Helicobacter pylori. This can also be diagnosed by a breath test or a microscopic examination of a portion (biopsy) of the small bowel. H. pylori is a germ that is found in the cells that line the stomach. It is a risk factor for stomach and small bowel ulcers, long standing inflammation of the lining of the stomach, or even ulcers that may occur in the esophagus (the canal that runs from the mouth to the stomach). This bacterium is also a factor in stomach cancer. The amount of the bacteria is found in about 10% of healthy persons younger than 78 years of age and the amount of the bacteria increases with age. Most persons with these bacteria have no symptoms; however, it is thought that when these bacteria cause ulcers, antibiotic medications can be used to help eliminate or reduce the problem.  °PREPARATION FOR TEST °No preparation or fasting is necessary. °NORMAL FINDINGS °Negative (H-pylori bacteria not present) °Ranges for normal findingsmay vary among different laboratories and hospitals. You should always check with your doctor after having lab work or other tests done to discuss the meaning of your test results and whether or not your values are considered within normal limits. °MEANING OF TEST  °Your caregiver will go over the test results with you and discuss the importance and meaning of your results, as well as treatment options and the need for additional tests if necessary. °It is your responsibility to obtain your test results. Ask the lab or department performing the test when and how you will get your results. °OBTAINING THE TEST RESULTS °It is your responsibility to obtain your test results. Ask the lab or department performing the test when and how you will get your results. °Document Released: 06/14/2004 Document Revised: 08/13/2011 Document Reviewed: 05/01/2008 °[employer]® Patient Information ©2014 [employer]. ° °

## 2013-11-12 NOTE — Consult Note (Signed)
[name]   Reason for [name]:  Visual hallucinations Referring Physician:  Dr. Kayren Eaves is an 80 y.o. female. Total Time spent with patient: 45 minutes  Assessment: AXIS I:  Major Depression, single episode AXIS II:  Deferred AXIS III:   Past Medical History  Diagnosis Date  . Dysmetabolic syndrome X   . Hyperlipidemia   . Other abnormal glucose   . Hypothyroidism   . DIVERTICULOSIS, COLON   . Hyperplastic colon polyp   . FIBROMYALGIA   . HYPERLIPIDEMIA   . HYPERTENSION, ESSENTIAL NOS   . HYPERTROPHIC CARDIOMYOPATHY   . Hyponatremia   . MYCOSIS FUNGOIDES   . OSTEOARTHRITIS   . OSTEOPOROSIS   . RENAL INSUFFICIENCY   . Unspecified vitamin D deficiency   . Nodule of left lung   . Normocytic anemia   . Adrenal myelolipoma   . Renal angiomyolipoma   . Gastric ulcer due to Helicobacter pylori    AXIS IV:  other psychosocial or environmental problems and recent discontinuation of medication  AXIS V:  51-60 moderate symptoms  Plan:  Paged Dr. Cruzita Lederer No evidence of imminent risk to self or others at present.   Patient does not meet criteria for psychiatric inpatient admission. Supportive therapy provided about ongoing stressors. Will be referred to out patient treatment when medically cleared.  Appreciate psychiatric consultation Please contact [phone] if needs further assistance  Subjective:   [name] is a 80 y.o. female patient admitted with hallucinations.  HPI:  Patient is seen for psychiatric consultation and examination and case discussed with patient and psychiatric case manager. Patient has stated that she was experiencing visual hallucinations like flowers on the wall going round and round which she never experienced. She also complaint about stomach acid and bacteria etc. She stated that her primary doctor and daughter thought she was depressed when she was not eating well and given a trial of wellbutrin which  she stopped after taking 20 days because she felt that she was not depressed. She started feeling bad, sick and hallucination which resulted coming to the hospital and than admitted. She states that she knows that she suppose not to stop her medication without supervision and recommendation from her physician. She denied symptoms of depression and anxiety. She has no visual hallucination since her medication is restarted in hospital. She was in agreement to continue to take medication until further recommendation given to her. She denied suicidal or homicidal ideation, intention or plans. She has no safety concerns.   Interval History: patient has stated feeling somewhat tired and weak but denied symptoms of depression, anxiety and psychosis including hallucinations since yesterday. She is feeling stable and hoping to be discharged soon.   HPI Elements:    Location:  hallucination. Quality:  fair. Severity:  mild. Timing:  dicontinuation of medication.  Past Psychiatric History: Past Medical History  Diagnosis Date  . Dysmetabolic syndrome X   . Hyperlipidemia   . Other abnormal glucose   . Hypothyroidism   . DIVERTICULOSIS, COLON   . Hyperplastic colon polyp   . FIBROMYALGIA   . HYPERLIPIDEMIA   . HYPERTENSION, ESSENTIAL NOS   . HYPERTROPHIC CARDIOMYOPATHY   . Hyponatremia   . MYCOSIS FUNGOIDES   . OSTEOARTHRITIS   . OSTEOPOROSIS   . RENAL INSUFFICIENCY   . Unspecified vitamin D deficiency   . Nodule of left lung   . Normocytic anemia   . Adrenal myelolipoma   . Renal angiomyolipoma   .  Gastric ulcer due to Helicobacter pylori     reports that she quit smoking about 50 years ago. She has never used smokeless tobacco. She reports that she does not drink alcohol or use illicit drugs. Family History  Problem Relation Age of Onset  . Diabetes Mother   . Heart attack Maternal Uncle 73    his 2 sons had MI in 34s  . Hypertension Maternal Grandmother   . Stroke Maternal  Grandmother 69  . Cancer Neg Hx   . Asthma Neg Hx   . COPD Neg Hx      Living Arrangements: Alone   Abuse/Neglect [facility]) Physical Abuse: Denies Verbal Abuse: Denies Sexual Abuse: Denies Allergies:   Allergies  Allergen Reactions  . Achromycin [Tetracycline Hcl]     Facial swelling   . Omnipen [Ampicillin]     Facial swelling   . Simvastatin Other (See Comments)    12/15/12 myalgias  . Celecoxib Other (See Comments)    REACTION: bleeding  . Ezetimibe-Simvastatin Diarrhea, Nausea And Vomiting and Other (See Comments)    REACTION: myalgia  . Abilify [Aripiprazole]     2/15 prescribed by psychiatry for depression along with Wellbutrin. This caused insomnia    ACT Assessment Complete:  NO Objective: Blood pressure 146/55, pulse 68, temperature 97.6 F (36.4 C), temperature source Oral, resp. rate 18, height '4\' 9"'  (1.448 m), weight 64.003 kg (141 lb 1.6 oz), SpO2 97.00%.Body mass index is 30.53 kg/(m^2). Results for orders placed during the hospital encounter of 11/08/13 (from the past 72 hour(s))  CBC     Status: Abnormal   Collection Time    11/10/13  5:21 AM      Result Value Ref Range   WBC 8.5  4.0 - 10.5 K/uL   RBC 3.83 (*) 3.87 - 5.11 MIL/uL   Hemoglobin 11.1 (*) 12.0 - 15.0 g/dL   HCT 31.8 (*) 36.0 - 46.0 %   MCV 83.0  78.0 - 100.0 fL   MCH 29.0  26.0 - 34.0 pg   MCHC 34.9  30.0 - 36.0 g/dL   RDW 13.2  11.5 - 15.5 %   Platelets 265  150 - 400 K/uL  BASIC METABOLIC PANEL     Status: Abnormal   Collection Time    11/10/13  5:21 AM      Result Value Ref Range   Sodium 130 (*) 137 - 147 mEq/L   Potassium 4.7  3.7 - 5.3 mEq/L   Chloride 94 (*) 96 - 112 mEq/L   CO2 21  19 - 32 mEq/L   Glucose, Bld 87  70 - 99 mg/dL   BUN 12  6 - 23 mg/dL   Creatinine, Ser 0.98  0.50 - 1.10 mg/dL   Calcium 9.2  8.4 - 10.5 mg/dL   GFR calc non Af Amer 52 (*) >90 mL/min   GFR calc Af Amer 60 (*) >90 mL/min   Comment: (NOTE)     The eGFR has been calculated using the CKD EPI  equation.     This calculation has not been validated in all clinical situations.     eGFR's persistently <90 mL/min signify possible Chronic Kidney     Disease.  BASIC METABOLIC PANEL     Status: Abnormal   Collection Time    11/11/13  4:44 AM      Result Value Ref Range   Sodium 129 (*) 137 - 147 mEq/L   Potassium 4.3  3.7 - 5.3 mEq/L   Chloride  92 (*) 96 - 112 mEq/L   CO2 24  19 - 32 mEq/L   Glucose, Bld 88  70 - 99 mg/dL   BUN 11  6 - 23 mg/dL   Creatinine, Ser 1.10  0.50 - 1.10 mg/dL   Calcium 9.0  8.4 - 10.5 mg/dL   GFR calc non Af Amer 45 (*) >90 mL/min   GFR calc Af Amer 52 (*) >90 mL/min   Comment: (NOTE)     The eGFR has been calculated using the CKD EPI equation.     This calculation has not been validated in all clinical situations.     eGFR's persistently <90 mL/min signify possible Chronic Kidney     Disease.  CBC     Status: Abnormal   Collection Time    11/12/13  4:45 AM      Result Value Ref Range   WBC 6.6  4.0 - 10.5 K/uL   RBC 3.80 (*) 3.87 - 5.11 MIL/uL   Hemoglobin 11.0 (*) 12.0 - 15.0 g/dL   HCT 32.1 (*) 36.0 - 46.0 %   MCV 84.5  78.0 - 100.0 fL   MCH 28.9  26.0 - 34.0 pg   MCHC 34.3  30.0 - 36.0 g/dL   RDW 13.3  11.5 - 15.5 %   Platelets 242  150 - 400 K/uL  BASIC METABOLIC PANEL     Status: Abnormal   Collection Time    11/12/13  4:45 AM      Result Value Ref Range   Sodium 127 (*) 137 - 147 mEq/L   Potassium 4.3  3.7 - 5.3 mEq/L   Chloride 91 (*) 96 - 112 mEq/L   CO2 23  19 - 32 mEq/L   Glucose, Bld 95  70 - 99 mg/dL   BUN 12  6 - 23 mg/dL   Creatinine, Ser 0.99  0.50 - 1.10 mg/dL   Calcium 8.9  8.4 - 10.5 mg/dL   GFR calc non Af Amer 51 (*) >90 mL/min   GFR calc Af Amer 59 (*) >90 mL/min   Comment: (NOTE)     The eGFR has been calculated using the CKD EPI equation.     This calculation has not been validated in all clinical situations.     eGFR's persistently <90 mL/min signify possible Chronic Kidney     Disease.   Labs are reviewed  and are pertinent for hyponatremia and hypochloremia.  Current Facility-Administered Medications  Medication Dose Route Frequency Provider Last Rate Last Dose  . acetaminophen (TYLENOL) tablet 650 mg  650 mg Oral Q4H PRN Hosie Poisson, MD   650 mg at 11/11/13 2211  . aspirin chewable tablet 81 mg  81 mg Oral q morning - 10a Hosie Poisson, MD   81 mg at 11/12/13 0950  . buPROPion (WELLBUTRIN SR) 12 hr tablet 100 mg  100 mg Oral Daily Hosie Poisson, MD   100 mg at 11/12/13 0950  . clarithromycin (BIAXIN) tablet 250 mg  250 mg Oral Q12H Theodis Blaze, MD   250 mg at 11/12/13 0950  . enoxaparin (LOVENOX) injection 40 mg  40 mg Subcutaneous Q24H Hosie Poisson, MD   40 mg at 11/12/13 1049  . levothyroxine (SYNTHROID, LEVOTHROID) tablet 37.5 mcg  37.5 mcg Oral Once per day on Tue Sat Hosie Poisson, MD   37.5 mcg at 11/10/13 0806  . levothyroxine (SYNTHROID, LEVOTHROID) tablet 75 mcg  75 mcg Oral Once per day on Sun Mon Wed Thu Fri  Hosie Poisson, MD   75 mcg at 11/12/13 0817  . lisinopril (PRINIVIL,ZESTRIL) tablet 10 mg  10 mg Oral Daily Hosie Poisson, MD   10 mg at 11/12/13 0949  . LORazepam (ATIVAN) tablet 0.5 mg  0.5 mg Oral BID Caren Griffins, MD   0.5 mg at 11/12/13 1049  . metroNIDAZOLE (FLAGYL) tablet 500 mg  500 mg Oral 3 times per day Hosie Poisson, MD   500 mg at 11/12/13 0817  . ondansetron (ZOFRAN-ODT) disintegrating tablet 8 mg  8 mg Oral Q4H PRN Theodis Blaze, MD   8 mg at 11/12/13 0817  . pantoprazole (PROTONIX) EC tablet 40 mg  40 mg Oral Daily Hosie Poisson, MD   40 mg at 11/12/13 0950  . promethazine (PHENERGAN) injection 12.5 mg  12.5 mg Intravenous Q6H PRN Theodis Blaze, MD      . saccharomyces boulardii [facility]) capsule 250 mg  250 mg Oral BID Theodis Blaze, MD   250 mg at 11/12/13 0950    Psychiatric Specialty Exam: Physical Exam  Review of Systems  Psychiatric/Behavioral: Positive for depression and hallucinations.  All other systems reviewed and are negative.   Blood pressure  146/55, pulse 68, temperature 97.6 F (36.4 C), temperature source Oral, resp. rate 18, height '4\' 9"'  (1.448 m), weight 64.003 kg (141 lb 1.6 oz), SpO2 97.00%.Body mass index is 30.53 kg/(m^2).  General Appearance: Casual  Eye Contact::  Good  Speech:  Clear and Coherent  Volume:  Normal  Mood:  Depressed  Affect:  Appropriate and Congruent  Thought Process:  Coherent and Goal Directed  Orientation:  Full (Time, Place, and Person)  Thought Content:  WDL  Suicidal Thoughts:  No  Homicidal Thoughts:  No  Memory:  Immediate;   Fair  Judgement:  Intact  Insight:  Good  Psychomotor Activity:  Decreased  Concentration:  Good  Recall:  Good  Fund of Knowledge:Good  Language: Good  Akathisia:  NA  Handed:  Right  AIMS (if indicated):     Assets:  Communication Skills Desire for Improvement Financial Resources/Insurance Housing Leisure Time [place] Talents/Skills Transportation  Sleep:      Musculoskeletal: Strength & Muscle Tone: within normal limits Gait & Station: normal Patient leans: N/A  Treatment Plan Summary: Daily contact with patient to assess and evaluate symptoms and progress in treatment Medication management  Addasyn Mcbreen,JANARDHAHA R. 11/12/2013 10:49 AM

## 2013-11-13 ENCOUNTER — Other Ambulatory Visit: Payer: Self-pay | Admitting: Internal Medicine

## 2013-11-13 ENCOUNTER — Telehealth: Payer: Self-pay | Admitting: Internal Medicine

## 2013-11-13 NOTE — Telephone Encounter (Signed)
Spoke with patient's daughter and she was discharged from the hospital on extended antibiotics. She is asking if her mother should take these,. Told her to call the MD that prescribed the medications to verify the reason they were ordered.

## 2013-11-13 NOTE — Telephone Encounter (Signed)
This must be ordered by the specialist Dr Laurette Schimke who saw her 6/7

## 2013-11-16 ENCOUNTER — Telehealth: Payer: Self-pay

## 2013-11-16 NOTE — Telephone Encounter (Signed)
The Psychaitrist must Rx this type of medication

## 2013-11-16 NOTE — Telephone Encounter (Signed)
She saw Psychiatrist in hospital; he must Rx any such med. She can make appt to discuss further

## 2013-11-16 NOTE — Telephone Encounter (Signed)
Pt states that she no longer wants to take wellbutrin.  She states that she already is on too much medication.  Pt wanted to know if she had to ween herself off of this med, if she can have a refill to complete the process because she only has a few pills left. Please advise

## 2013-11-16 NOTE — Telephone Encounter (Signed)
Pt states that the last rx for wellbutrin was filled by you and she doesn't know what to do because she has to ween off of this medication. Please advise

## 2013-11-17 ENCOUNTER — Encounter: Payer: Self-pay | Admitting: *Deleted

## 2013-11-17 ENCOUNTER — Telehealth: Payer: Self-pay

## 2013-11-17 NOTE — Telephone Encounter (Signed)
Pt.notified

## 2013-11-17 NOTE — Telephone Encounter (Signed)
Pt scheduled an appt with dr. Linna Darner on 11/23/2013 to discuss plans for stopping wellbutrin

## 2013-11-20 DIAGNOSIS — I10 Essential (primary) hypertension: Secondary | ICD-10-CM | POA: Diagnosis not present

## 2013-11-20 DIAGNOSIS — A048 Other specified bacterial intestinal infections: Secondary | ICD-10-CM | POA: Diagnosis not present

## 2013-11-20 DIAGNOSIS — D649 Anemia, unspecified: Secondary | ICD-10-CM | POA: Diagnosis not present

## 2013-11-20 DIAGNOSIS — IMO0001 Reserved for inherently not codable concepts without codable children: Secondary | ICD-10-CM | POA: Diagnosis not present

## 2013-11-23 ENCOUNTER — Ambulatory Visit (INDEPENDENT_AMBULATORY_CARE_PROVIDER_SITE_OTHER): Payer: Medicare Other | Admitting: Internal Medicine

## 2013-11-23 ENCOUNTER — Encounter: Payer: Self-pay | Admitting: Internal Medicine

## 2013-11-23 ENCOUNTER — Other Ambulatory Visit (INDEPENDENT_AMBULATORY_CARE_PROVIDER_SITE_OTHER): Payer: Medicare Other

## 2013-11-23 VITALS — BP 170/76 | HR 63 | Temp 97.5°F | Wt 134.0 lb

## 2013-11-23 DIAGNOSIS — K2940 Chronic atrophic gastritis without bleeding: Secondary | ICD-10-CM | POA: Diagnosis not present

## 2013-11-23 DIAGNOSIS — A048 Other specified bacterial intestinal infections: Secondary | ICD-10-CM

## 2013-11-23 DIAGNOSIS — E871 Hypo-osmolality and hyponatremia: Secondary | ICD-10-CM | POA: Diagnosis not present

## 2013-11-23 DIAGNOSIS — B9681 Helicobacter pylori [H. pylori] as the cause of diseases classified elsewhere: Secondary | ICD-10-CM

## 2013-11-23 DIAGNOSIS — F341 Dysthymic disorder: Secondary | ICD-10-CM | POA: Diagnosis not present

## 2013-11-23 DIAGNOSIS — K2970 Gastritis, unspecified, without bleeding: Secondary | ICD-10-CM

## 2013-11-23 DIAGNOSIS — F329 Major depressive disorder, single episode, unspecified: Secondary | ICD-10-CM

## 2013-11-23 DIAGNOSIS — F419 Anxiety disorder, unspecified: Principal | ICD-10-CM

## 2013-11-23 LAB — BASIC METABOLIC PANEL
BUN: 18 mg/dL (ref 6–23)
CO2: 28 meq/L (ref 19–32)
Calcium: 9.6 mg/dL (ref 8.4–10.5)
Chloride: 97 mEq/L (ref 96–112)
Creatinine, Ser: 0.9 mg/dL (ref 0.4–1.2)
GFR: 60.32 mL/min (ref >60.00)
Glucose, Bld: 99 mg/dL (ref 70–99)
Potassium: 5 mEq/L (ref 3.5–5.1)
SODIUM: 131 meq/L — AB (ref 135–145)

## 2013-11-23 MED ORDER — BUPROPION HCL ER (SR) 100 MG PO TB12: 100.0000 mg | ORAL_TABLET | Freq: Every day | ORAL | Status: DC

## 2013-11-23 NOTE — Progress Notes (Signed)
Pre visit review using our clinic review tool, if applicable. No additional management support is needed unless otherwise documented below in the visit note. 

## 2013-11-23 NOTE — Patient Instructions (Signed)
Your next office appointment will be determined based upon review of your pending labs . Those instructions will be transmitted to you through My Chart . Lorazepam is high risk , controlled substance as per Dr Sheria Lang' s list.It must be  taken as little as possible, as needed only because of these risks of falls & possibly Alzheimer's.

## 2013-11-23 NOTE — Progress Notes (Signed)
   Subjective:    Patient ID: [name], female    DOB: 1927-06-28, 80 y.o.   MRN: [id number]  HPI Hospital records were reviewed & discussed with her & her daughter.  She was treated with triple therapy for H. pylori-induced ulcer. She had severe GI symptoms with Biaxin. It was also question that it may have caused some visual hallucinations. She was unable to complete the full course of Biaxin but did complete the other 2 drugs.  After 4 days off Wellbutrin visual hallucinations appeared ; but @ that time she was on triple therapy.  Psychiatry consultation was completed in  the hospital.He recommended continuing the Wellbutrin. Abilify was also  prescribed but this caused insomnia.  They are concerned about her recurrent hyponatremia despite restricting oral hydration.   Review of Systems  Abdominal pain, significant dyspepsia, dysphagia, melena, rectal bleeding, or persistently small caliber stools are denied.      Objective:   Physical Exam  Significant or distinguishing  findings on physical exam are documented first.  Below that are other systems examined & findings.   The most striking finding is her increased anxiety. She has minimal tenting.  General appearance is one of good health and nourishment w/o distress. Eyes: No conjunctival inflammation or scleral icterus is present. Oral exam: Dental hygiene is good; lips and gums are healthy appearing.There is no oropharyngeal erythema or exudate noted.  Heart:  Normal rate and regular rhythm. S1 and S2 normal without gallop, murmur, click, rub or other extra sounds   Lungs:Chest clear to auscultation; no wheezes, rhonchi,rales ,or rubs present.No increased work of breathing.  Abdomen: bowel sounds normal, soft and non-tender without masses, organomegaly or hernias noted.  No guarding or rebound . No tenderness over the flanks to percussion. Musculoskeletal: Able to lie flat and sit up without help. Negative straight leg  raising bilaterally. Gait normal Skin:Warm & dry.  Intact without suspicious lesions or rashes ; no jaundice  Lymphatic: No lymphadenopathy is noted about the head, neck, axilla areas.              Assessment & Plan:  #1 adverse drug effect, most likely related to Biaxin interacting with other medications. Incomplete therapeutic program for H. pylori gastritis  #2 anxiety. Risk of lorazepam discussed. Wellbutrin will be reinitiated   #3 recurrent hyponatremia  See orders

## 2013-11-24 DIAGNOSIS — I10 Essential (primary) hypertension: Secondary | ICD-10-CM | POA: Diagnosis not present

## 2013-11-24 DIAGNOSIS — A048 Other specified bacterial intestinal infections: Secondary | ICD-10-CM | POA: Diagnosis not present

## 2013-11-24 DIAGNOSIS — D649 Anemia, unspecified: Secondary | ICD-10-CM | POA: Diagnosis not present

## 2013-11-24 DIAGNOSIS — IMO0001 Reserved for inherently not codable concepts without codable children: Secondary | ICD-10-CM | POA: Diagnosis not present

## 2013-11-26 DIAGNOSIS — I10 Essential (primary) hypertension: Secondary | ICD-10-CM | POA: Diagnosis not present

## 2013-11-26 DIAGNOSIS — D649 Anemia, unspecified: Secondary | ICD-10-CM | POA: Diagnosis not present

## 2013-11-26 DIAGNOSIS — IMO0001 Reserved for inherently not codable concepts without codable children: Secondary | ICD-10-CM | POA: Diagnosis not present

## 2013-11-26 DIAGNOSIS — A048 Other specified bacterial intestinal infections: Secondary | ICD-10-CM | POA: Diagnosis not present

## 2013-11-27 ENCOUNTER — Ambulatory Visit: Payer: Medicare Other | Admitting: Internal Medicine

## 2013-12-01 DIAGNOSIS — I10 Essential (primary) hypertension: Secondary | ICD-10-CM | POA: Diagnosis not present

## 2013-12-01 DIAGNOSIS — A048 Other specified bacterial intestinal infections: Secondary | ICD-10-CM | POA: Diagnosis not present

## 2013-12-01 DIAGNOSIS — IMO0001 Reserved for inherently not codable concepts without codable children: Secondary | ICD-10-CM | POA: Diagnosis not present

## 2013-12-01 DIAGNOSIS — D649 Anemia, unspecified: Secondary | ICD-10-CM | POA: Diagnosis not present

## 2013-12-03 DIAGNOSIS — A048 Other specified bacterial intestinal infections: Secondary | ICD-10-CM | POA: Diagnosis not present

## 2013-12-03 DIAGNOSIS — IMO0001 Reserved for inherently not codable concepts without codable children: Secondary | ICD-10-CM | POA: Diagnosis not present

## 2013-12-03 DIAGNOSIS — D649 Anemia, unspecified: Secondary | ICD-10-CM | POA: Diagnosis not present

## 2013-12-03 DIAGNOSIS — I10 Essential (primary) hypertension: Secondary | ICD-10-CM | POA: Diagnosis not present

## 2013-12-11 ENCOUNTER — Other Ambulatory Visit: Payer: Self-pay | Admitting: Internal Medicine

## 2013-12-14 DIAGNOSIS — I10 Essential (primary) hypertension: Secondary | ICD-10-CM | POA: Diagnosis not present

## 2013-12-14 DIAGNOSIS — IMO0001 Reserved for inherently not codable concepts without codable children: Secondary | ICD-10-CM | POA: Diagnosis not present

## 2013-12-14 DIAGNOSIS — D649 Anemia, unspecified: Secondary | ICD-10-CM

## 2013-12-14 DIAGNOSIS — A048 Other specified bacterial intestinal infections: Secondary | ICD-10-CM | POA: Diagnosis not present

## 2014-01-22 ENCOUNTER — Ambulatory Visit (INDEPENDENT_AMBULATORY_CARE_PROVIDER_SITE_OTHER): Payer: Medicare Other | Admitting: Internal Medicine

## 2014-01-22 ENCOUNTER — Encounter: Payer: Self-pay | Admitting: Internal Medicine

## 2014-01-22 VITALS — BP 126/72 | HR 66 | Ht <= 58 in | Wt 138.0 lb

## 2014-01-22 DIAGNOSIS — K253 Acute gastric ulcer without hemorrhage or perforation: Secondary | ICD-10-CM | POA: Diagnosis not present

## 2014-01-22 MED ORDER — ONDANSETRON 4 MG PO TBDP: 4.0000 mg | ORAL_TABLET | Freq: Three times a day (TID) | ORAL | Status: DC | PRN

## 2014-01-22 NOTE — Patient Instructions (Addendum)
We have sent the following medications to your pharmacy for you to pick up at your convenience: Zofran  Please continue Prilosec 20 mg daily.  CC:Dr [employer]

## 2014-01-22 NOTE — Progress Notes (Signed)
[name] Oct 08, 1927 [id number]  Note: This dictation was prepared with Dragon digital system. Any transcriptional errors that result from this procedure are unintentional.   History of Present Illness:  This is an 80 year old white female who had gastric ulcer on  upper endoscopy in May 2015. It was a 6-7 mm antral ulcer which was H. pylori positive. She was treated with biaxin and amoxicillin as well as PPIs but became nauseated and had to interrupt the therapy. She finally completed A 14 day course. She is doing fine. Her nausea has subsided. There has been no abdominal pain. Prior endoscopies were in 2002 and 2009. She is also up-to-date on her colonoscopy which was done in 2009. No recall  planned because of  age. She continues on omeprazole 20 mg daily.    Past Medical History  Diagnosis Date  . Dysmetabolic syndrome X   . Hyperlipidemia   . Other abnormal glucose   . Hypothyroidism   . DIVERTICULOSIS, COLON   . Hyperplastic colon polyp   . FIBROMYALGIA   . HYPERLIPIDEMIA   . HYPERTENSION, ESSENTIAL NOS   . HYPERTROPHIC CARDIOMYOPATHY   . Hyponatremia   . MYCOSIS FUNGOIDES   . OSTEOARTHRITIS   . OSTEOPOROSIS   . RENAL INSUFFICIENCY   . Unspecified vitamin D deficiency   . Nodule of left lung   . Normocytic anemia   . Adrenal myelolipoma   . Renal angiomyolipoma   . Gastric ulcer due to Helicobacter pylori   . Hiatal hernia     Past Surgical History  Procedure Laterality Date  . G 4 p 2    . Colonoscopy with polypectomy  07/25/2007    Dr Olevia Perches, Recheck in 7 years for 2009    Allergies  Allergen Reactions  . Achromycin [Tetracycline Hcl]     Facial swelling   . Omnipen [Ampicillin]     Facial swelling   . Simvastatin Other (See Comments)    12/15/12 myalgias  . Celecoxib Other (See Comments)    REACTION: bleeding  . Clarithromycin   . Ezetimibe-Simvastatin Diarrhea, Nausea And Vomiting and Other (See Comments)    REACTION: myalgia  . Flagyl  [Metronidazole]   . Abilify [Aripiprazole]     2/15 prescribed by psychiatry for depression along with Wellbutrin. This caused insomnia    Family history and social history have been reviewed.  Review of Systems: Denies nausea  The remainder of the 10 point ROS is negative except as outlined in the H&P  Physical Exam: General Appearance Well developed, in no distress Eyes  Non icteric  HEENT  Non traumatic, normocephalic  Mouth No lesion, tongue papillated, no cheilosis Neck Supple without adenopathy, thyroid not enlarged, no carotid bruits, no JVD Lungs Clear to auscultation bilaterally COR Normal S1, normal S2, regular rhythm, no murmur, quiet precordium Abdomen soft nontender abdomen with normoactive bowel sounds. Decreased muscle tone. No fullness Rectal not done Extremities  No pedal edema Skin No lesions Neurological Alert and oriented x 3 Psychological Normal mood and affect  Assessment and Plan:   Problem #54 80 year old white female with H. pylori positive antral ulcers in May 2015. She is currently doing well on omeprazole 20 mg a day after completing a course of H. pylori therapy. She initially got sick on the H pylori regimen but she was able to complete it. I will see her on a when necessary basis.    Delfin Edis 01/22/2014

## 2014-01-25 ENCOUNTER — Telehealth: Payer: Self-pay | Admitting: *Deleted

## 2014-01-25 DIAGNOSIS — R911 Solitary pulmonary nodule: Secondary | ICD-10-CM

## 2014-01-25 NOTE — Telephone Encounter (Signed)
Scheduled CT at [facility] CT on 02/01/14 at 10:00 AM. NPO 2 hours prior. Needs BUN, Crea also. Labs in EPIC. Patient notified and given appointment date time and instructions.

## 2014-01-25 NOTE — Telephone Encounter (Signed)
Message copied by Hulan Saas on Mon Jan 25, 2014 11:36 AM ------      Message from: Nicoletta Ba S      Created: Mon Jan 25, 2014 10:19 AM       Yes- she had a 36mm nodule on Ct in feb 2015- recommended 6 month f/u Ct of chest - please order      ----- Message -----         From: Hulan Saas, RN         Sent: 01/18/2014   1:32 PM           To: Amy S Esterwood, PA-C            Amy, do you want this patient to have a Chest CT to f/u on nodule on CT 07/1012.?      Zakhia Seres       ------

## 2014-02-01 ENCOUNTER — Other Ambulatory Visit: Payer: Self-pay

## 2014-02-02 ENCOUNTER — Other Ambulatory Visit (INDEPENDENT_AMBULATORY_CARE_PROVIDER_SITE_OTHER): Payer: Medicare Other

## 2014-02-02 DIAGNOSIS — R911 Solitary pulmonary nodule: Secondary | ICD-10-CM | POA: Diagnosis not present

## 2014-02-02 LAB — CREATININE, SERUM: Creatinine, Ser: 1.1 mg/dL (ref 0.4–1.2)

## 2014-02-02 LAB — BUN: BUN: 20 mg/dL (ref 6–23)

## 2014-02-15 ENCOUNTER — Ambulatory Visit (INDEPENDENT_AMBULATORY_CARE_PROVIDER_SITE_OTHER)
Admission: RE | Admit: 2014-02-15 | Discharge: 2014-02-15 | Disposition: A | Discharge status: Home / Self Care | Payer: Medicare Other | Source: Ambulatory Visit | Attending: Physician Assistant | Admitting: Physician Assistant

## 2014-02-15 DIAGNOSIS — D175 Benign lipomatous neoplasm of intra-abdominal organs: Secondary | ICD-10-CM | POA: Diagnosis not present

## 2014-02-15 DIAGNOSIS — R911 Solitary pulmonary nodule: Secondary | ICD-10-CM | POA: Diagnosis not present

## 2014-02-15 MED ORDER — IOHEXOL 300 MG/ML  SOLN
80.0000 mL | Freq: Once | INTRAMUSCULAR | Status: AC | PRN
  Administered 2014-02-15: 80 mL via INTRAVENOUS

## 2014-02-24 DIAGNOSIS — Z23 Encounter for immunization: Secondary | ICD-10-CM | POA: Diagnosis not present

## 2014-02-26 DIAGNOSIS — Z1231 Encounter for screening mammogram for malignant neoplasm of breast: Secondary | ICD-10-CM | POA: Diagnosis not present

## 2014-03-10 ENCOUNTER — Encounter: Payer: Self-pay | Admitting: Internal Medicine

## 2014-03-19 ENCOUNTER — Other Ambulatory Visit: Payer: Self-pay

## 2014-05-17 ENCOUNTER — Other Ambulatory Visit: Payer: Self-pay | Admitting: Internal Medicine

## 2014-05-17 NOTE — Telephone Encounter (Signed)
OK X 3 mos 

## 2014-08-06 ENCOUNTER — Other Ambulatory Visit (INDEPENDENT_AMBULATORY_CARE_PROVIDER_SITE_OTHER): Payer: Medicare Other

## 2014-08-06 ENCOUNTER — Ambulatory Visit (INDEPENDENT_AMBULATORY_CARE_PROVIDER_SITE_OTHER): Payer: Medicare Other | Admitting: Internal Medicine

## 2014-08-06 ENCOUNTER — Encounter: Payer: Self-pay | Admitting: Internal Medicine

## 2014-08-06 VITALS — BP 120/76 | HR 71 | Temp 97.6°F | Ht <= 58 in | Wt 143.0 lb

## 2014-08-06 DIAGNOSIS — F329 Major depressive disorder, single episode, unspecified: Secondary | ICD-10-CM

## 2014-08-06 DIAGNOSIS — F32A Depression, unspecified: Secondary | ICD-10-CM

## 2014-08-06 DIAGNOSIS — I10 Essential (primary) hypertension: Secondary | ICD-10-CM | POA: Diagnosis not present

## 2014-08-06 DIAGNOSIS — E038 Other specified hypothyroidism: Secondary | ICD-10-CM

## 2014-08-06 DIAGNOSIS — Z86010 Personal history of colonic polyps: Secondary | ICD-10-CM

## 2014-08-06 DIAGNOSIS — E782 Mixed hyperlipidemia: Secondary | ICD-10-CM

## 2014-08-06 DIAGNOSIS — Z23 Encounter for immunization: Secondary | ICD-10-CM

## 2014-08-06 LAB — BASIC METABOLIC PANEL
BUN: 21 mg/dL (ref 6–23)
CO2: 28 meq/L (ref 19–32)
CREATININE: 1.28 mg/dL — AB (ref 0.40–1.20)
Calcium: 9.7 mg/dL (ref 8.4–10.5)
Chloride: 103 mEq/L (ref 96–112)
GFR: 42.17 mL/min — ABNORMAL LOW (ref >60.00)
GLUCOSE: 96 mg/dL (ref 70–99)
POTASSIUM: 4.6 meq/L (ref 3.5–5.1)
Sodium: 135 mEq/L (ref 135–145)

## 2014-08-06 LAB — LIPID PANEL
CHOLESTEROL: 223 mg/dL — AB (ref 0–200)
HDL: 64.7 mg/dL (ref >39.00)
LDL Cholesterol: 137 mg/dL — ABNORMAL HIGH (ref 0–99)
NonHDL: 158.3
Total CHOL/HDL Ratio: 3
Triglycerides: 108 mg/dL (ref 0.0–149.0)
VLDL: 21.6 mg/dL (ref 0.0–40.0)

## 2014-08-06 LAB — HEPATIC FUNCTION PANEL
ALBUMIN: 4.3 g/dL (ref 3.5–5.2)
ALT: 13 U/L (ref 0–35)
AST: 16 U/L (ref 0–37)
Alkaline Phosphatase: 93 U/L (ref 39–117)
BILIRUBIN DIRECT: 0.1 mg/dL (ref 0.0–0.3)
BILIRUBIN TOTAL: 0.5 mg/dL (ref 0.2–1.2)
TOTAL PROTEIN: 7 g/dL (ref 6.0–8.3)

## 2014-08-06 LAB — CBC WITH DIFFERENTIAL/PLATELET
BASOS ABS: 0 10*3/uL (ref 0.0–0.1)
Basophils Relative: 0.6 % (ref 0.0–3.0)
Eosinophils Absolute: 0.1 10*3/uL (ref 0.0–0.7)
Eosinophils Relative: 1 % (ref 0.0–5.0)
HCT: 34.9 % — ABNORMAL LOW (ref 36.0–46.0)
Hemoglobin: 11.9 g/dL — ABNORMAL LOW (ref 12.0–15.0)
LYMPHS PCT: 20.5 % (ref 12.0–46.0)
Lymphs Abs: 1.1 10*3/uL (ref 0.7–4.0)
MCHC: 34 g/dL (ref 30.0–36.0)
MCV: 83.3 fl (ref 78.0–100.0)
MONOS PCT: 9.2 % (ref 3.0–12.0)
Monocytes Absolute: 0.5 10*3/uL (ref 0.1–1.0)
Neutro Abs: 3.6 10*3/uL (ref 1.4–7.7)
Neutrophils Relative %: 68.7 % (ref 43.0–77.0)
PLATELETS: 223 10*3/uL (ref 150.0–400.0)
RBC: 4.19 Mil/uL (ref 3.87–5.11)
RDW: 14.2 % (ref 11.5–15.5)
WBC: 5.3 10*3/uL (ref 4.0–10.5)

## 2014-08-06 LAB — TSH: TSH: 1.93 u[IU]/mL (ref 0.35–4.50)

## 2014-08-06 NOTE — Progress Notes (Signed)
   Subjective:    Patient ID: [name], female    DOB: [birth year], 81 y.o.   MRN: [id number]  HPI The patient is here to assess status of active health conditions.  PMH, FH, & Social History reviewed & updated.  She has been taking her medications without adverse effects.  She is on a decreased sodium diet. She's not exercising. She has no active health issues.  Her depression is stable without exacerbation.  In reference to her thyroid she does have some cold intolerance. She did not bring her blood pressure readings with her.   There is a notation that she may have follow-up colonoscopy this year based on polyp at last colonoscopy.She has no active GI symptoms.  Review of Systems  Constitutional: No significant change in weight; significant fatigue; sleep disorder; change in appetite. Eye: no blurred, double ,loss of vision Cardiovascular: no palpitations; racing; irregularity;chest pain GI: no constipation; diarrhea;hoarseness;dysphagia;melena; rectal bleeding Derm: no change in nails,hair,skin Neuro: no numbness or tingling; tremor Psych:no anxiety or panic attacks Endo: no temperature intolerance to heat       Objective:   Physical Exam Pertinent or positive findings include: Appears younger than stated age Bilateral ptosis is present.  She has an S4 slurring.  The upper thoracic curvature is accentuated. Abdomen is protuberant.  She has minimal DIP changes.  She has slight crepits of the right knee.  Pedal pulses are slightly decreased but intact and equal.   General appearance :adequately nourished; in no distress. Eyes: No conjunctival inflammation or scleral icterus is present. Oral exam:  Lips and gums are healthy appearing.There is no oropharyngeal erythema or exudate noted. Dental hygiene is good. Heart:  Normal rate and regular rhythm. S1 and S2 normal without gallop, murmur, click, or rub. Lungs:Chest clear to auscultation; no wheezes, rhonchi,rales ,or  rubs present.No increased work of breathing.  Abdomen: bowel sounds normal, soft and non-tender without masses, organomegaly or hernias noted.  No guarding or rebound.  Vascular : no bruits present. Skin:Warm & dry.  Intact without suspicious lesions or rashes ; no tenting or jaundice  Lymphatic: No lymphadenopathy is noted about the head, neck, axilla Neuro: Strength, tone & DTRs normal.      Assessment & Plan:  See Current Assessment & Plan in Problem List under specific Diagnosis

## 2014-08-06 NOTE — Assessment & Plan Note (Signed)
Blood pressure goals reviewed. BMET 

## 2014-08-06 NOTE — Assessment & Plan Note (Signed)
CBC

## 2014-08-06 NOTE — Progress Notes (Signed)
Pre visit review using our clinic review tool, if applicable. No additional management support is needed unless otherwise documented below in the visit note. 

## 2014-08-06 NOTE — Assessment & Plan Note (Signed)
TSH 

## 2014-08-06 NOTE — Patient Instructions (Signed)
  Your next office appointment will be determined based upon review of your pending labs   Those instructions will be transmitted to you through My Chart   Critical values will be called.  Followup as needed for any active or acute issue. Please report any significant change in your symptoms. 

## 2014-08-06 NOTE — Assessment & Plan Note (Signed)
Lipids, LFTs, TSH  

## 2014-08-06 NOTE — Assessment & Plan Note (Signed)
Continue wellbutrin

## 2014-08-17 ENCOUNTER — Other Ambulatory Visit: Payer: Self-pay | Admitting: Internal Medicine

## 2014-08-17 NOTE — Telephone Encounter (Signed)
OK X 3 mos 

## 2014-08-18 ENCOUNTER — Telehealth: Payer: Self-pay

## 2014-08-18 NOTE — Telephone Encounter (Signed)
Patient recd flu vaccine in September 2015

## 2014-09-13 ENCOUNTER — Other Ambulatory Visit: Payer: Self-pay

## 2014-09-13 MED ORDER — LEVOTHYROXINE SODIUM 75 MCG PO TABS: ORAL_TABLET | ORAL | Status: DC

## 2014-09-13 MED ORDER — LISINOPRIL 20 MG PO TABS: 20.0000 mg | ORAL_TABLET | Freq: Every day | ORAL | Status: DC

## 2014-09-17 DIAGNOSIS — H3531 Nonexudative age-related macular degeneration: Secondary | ICD-10-CM | POA: Diagnosis not present

## 2014-09-17 DIAGNOSIS — H02833 Dermatochalasis of right eye, unspecified eyelid: Secondary | ICD-10-CM | POA: Diagnosis not present

## 2014-09-17 DIAGNOSIS — H25013 Cortical age-related cataract, bilateral: Secondary | ICD-10-CM | POA: Diagnosis not present

## 2014-09-17 DIAGNOSIS — H2513 Age-related nuclear cataract, bilateral: Secondary | ICD-10-CM | POA: Diagnosis not present

## 2014-10-19 ENCOUNTER — Other Ambulatory Visit (INDEPENDENT_AMBULATORY_CARE_PROVIDER_SITE_OTHER): Payer: Medicare Other

## 2014-10-19 ENCOUNTER — Telehealth: Payer: Self-pay | Admitting: Internal Medicine

## 2014-10-19 ENCOUNTER — Other Ambulatory Visit: Payer: Self-pay | Admitting: Internal Medicine

## 2014-10-19 DIAGNOSIS — R195 Other fecal abnormalities: Secondary | ICD-10-CM | POA: Diagnosis not present

## 2014-10-19 LAB — CBC WITH DIFFERENTIAL/PLATELET
BASOS PCT: 0.5 % (ref 0.0–3.0)
Basophils Absolute: 0 10*3/uL (ref 0.0–0.1)
EOS PCT: 1.2 % (ref 0.0–5.0)
Eosinophils Absolute: 0.1 10*3/uL (ref 0.0–0.7)
HCT: 34.7 % — ABNORMAL LOW (ref 36.0–46.0)
Hemoglobin: 11.9 g/dL — ABNORMAL LOW (ref 12.0–15.0)
LYMPHS PCT: 17 % (ref 12.0–46.0)
Lymphs Abs: 1 10*3/uL (ref 0.7–4.0)
MCHC: 34.2 g/dL (ref 30.0–36.0)
MCV: 83.2 fl (ref 78.0–100.0)
MONO ABS: 0.4 10*3/uL (ref 0.1–1.0)
Monocytes Relative: 7.5 % (ref 3.0–12.0)
Neutro Abs: 4.3 10*3/uL (ref 1.4–7.7)
Neutrophils Relative %: 73.8 % (ref 43.0–77.0)
Platelets: 240 10*3/uL (ref 150.0–400.0)
RBC: 4.17 Mil/uL (ref 3.87–5.11)
RDW: 14.5 % (ref 11.5–15.5)
WBC: 5.8 10*3/uL (ref 4.0–10.5)

## 2014-10-19 MED ORDER — OMEPRAZOLE 20 MG PO CPDR: DELAYED_RELEASE_CAPSULE | ORAL | Status: DC

## 2014-10-19 NOTE — Telephone Encounter (Signed)
Patient calling to report black stools x 2 days. States she had 2 black stools yesterday and one today. She reports left side area that is "touchy once in awhile" for the last several weeks. She denies abdominal pain, use of iron or Pepto bismol. She takes Ibuprofen BID. She is taking her Omeprazole.  Offered OV with extender but she could not come in .Please, advise.

## 2014-10-19 NOTE — Telephone Encounter (Signed)
Patient given recommendations. Lab in EPIC. Rx sent.

## 2014-10-19 NOTE — Telephone Encounter (Signed)
Please increase Prelosec to 20 mg po bid x 2 weeks.. Also, obtain CBC today or in am .

## 2014-10-22 ENCOUNTER — Other Ambulatory Visit: Payer: Self-pay | Admitting: *Deleted

## 2014-10-22 DIAGNOSIS — R195 Other fecal abnormalities: Secondary | ICD-10-CM

## 2014-11-03 ENCOUNTER — Other Ambulatory Visit (INDEPENDENT_AMBULATORY_CARE_PROVIDER_SITE_OTHER): Payer: Medicare Other

## 2014-11-03 DIAGNOSIS — R195 Other fecal abnormalities: Secondary | ICD-10-CM | POA: Diagnosis not present

## 2014-11-03 LAB — CBC WITH DIFFERENTIAL/PLATELET
BASOS PCT: 0.6 % (ref 0.0–3.0)
Basophils Absolute: 0 10*3/uL (ref 0.0–0.1)
Eosinophils Absolute: 0.1 10*3/uL (ref 0.0–0.7)
Eosinophils Relative: 1.9 % (ref 0.0–5.0)
HEMATOCRIT: 33.2 % — AB (ref 36.0–46.0)
Hemoglobin: 11.1 g/dL — ABNORMAL LOW (ref 12.0–15.0)
Lymphocytes Relative: 23.5 % (ref 12.0–46.0)
Lymphs Abs: 1.3 10*3/uL (ref 0.7–4.0)
MCHC: 33.5 g/dL (ref 30.0–36.0)
MCV: 84.8 fl (ref 78.0–100.0)
Monocytes Absolute: 0.6 10*3/uL (ref 0.1–1.0)
Monocytes Relative: 10.2 % (ref 3.0–12.0)
Neutro Abs: 3.6 10*3/uL (ref 1.4–7.7)
Neutrophils Relative %: 63.8 % (ref 43.0–77.0)
Platelets: 212 10*3/uL (ref 150.0–400.0)
RBC: 3.92 Mil/uL (ref 3.87–5.11)
RDW: 14.7 % (ref 11.5–15.5)
WBC: 5.7 10*3/uL (ref 4.0–10.5)

## 2014-11-04 ENCOUNTER — Other Ambulatory Visit: Payer: Self-pay | Admitting: *Deleted

## 2014-11-04 DIAGNOSIS — D509 Iron deficiency anemia, unspecified: Secondary | ICD-10-CM

## 2014-11-04 DIAGNOSIS — K253 Acute gastric ulcer without hemorrhage or perforation: Secondary | ICD-10-CM

## 2014-11-05 DIAGNOSIS — D485 Neoplasm of uncertain behavior of skin: Secondary | ICD-10-CM | POA: Diagnosis not present

## 2014-11-05 DIAGNOSIS — L308 Other specified dermatitis: Secondary | ICD-10-CM | POA: Diagnosis not present

## 2014-11-05 DIAGNOSIS — L814 Other melanin hyperpigmentation: Secondary | ICD-10-CM | POA: Diagnosis not present

## 2014-11-05 DIAGNOSIS — D2272 Melanocytic nevi of left lower limb, including hip: Secondary | ICD-10-CM | POA: Diagnosis not present

## 2014-11-05 DIAGNOSIS — Z85828 Personal history of other malignant neoplasm of skin: Secondary | ICD-10-CM | POA: Diagnosis not present

## 2014-11-05 DIAGNOSIS — L821 Other seborrheic keratosis: Secondary | ICD-10-CM | POA: Diagnosis not present

## 2014-11-05 DIAGNOSIS — D2271 Melanocytic nevi of right lower limb, including hip: Secondary | ICD-10-CM | POA: Diagnosis not present

## 2014-11-05 DIAGNOSIS — D1801 Hemangioma of skin and subcutaneous tissue: Secondary | ICD-10-CM | POA: Diagnosis not present

## 2014-11-05 DIAGNOSIS — L820 Inflamed seborrheic keratosis: Secondary | ICD-10-CM | POA: Diagnosis not present

## 2014-11-16 ENCOUNTER — Other Ambulatory Visit: Payer: Self-pay | Admitting: Internal Medicine

## 2015-01-05 DIAGNOSIS — L820 Inflamed seborrheic keratosis: Secondary | ICD-10-CM | POA: Diagnosis not present

## 2015-01-05 DIAGNOSIS — M722 Plantar fascial fibromatosis: Secondary | ICD-10-CM | POA: Diagnosis not present

## 2015-01-05 DIAGNOSIS — L309 Dermatitis, unspecified: Secondary | ICD-10-CM | POA: Diagnosis not present

## 2015-01-05 DIAGNOSIS — Z85828 Personal history of other malignant neoplasm of skin: Secondary | ICD-10-CM | POA: Diagnosis not present

## 2015-01-05 DIAGNOSIS — L821 Other seborrheic keratosis: Secondary | ICD-10-CM | POA: Diagnosis not present

## 2015-01-10 ENCOUNTER — Other Ambulatory Visit (INDEPENDENT_AMBULATORY_CARE_PROVIDER_SITE_OTHER): Payer: Medicare Other

## 2015-01-10 DIAGNOSIS — Z79899 Other long term (current) drug therapy: Secondary | ICD-10-CM | POA: Diagnosis not present

## 2015-01-10 DIAGNOSIS — K253 Acute gastric ulcer without hemorrhage or perforation: Secondary | ICD-10-CM | POA: Diagnosis not present

## 2015-01-10 DIAGNOSIS — D509 Iron deficiency anemia, unspecified: Secondary | ICD-10-CM

## 2015-01-10 LAB — CBC WITH DIFFERENTIAL/PLATELET
BASOS ABS: 0 10*3/uL (ref 0.0–0.1)
Basophils Relative: 0.5 % (ref 0.0–3.0)
Eosinophils Absolute: 0.1 10*3/uL (ref 0.0–0.7)
Eosinophils Relative: 2.2 % (ref 0.0–5.0)
HCT: 33.6 % — ABNORMAL LOW (ref 36.0–46.0)
HEMOGLOBIN: 11.5 g/dL — AB (ref 12.0–15.0)
LYMPHS ABS: 1.2 10*3/uL (ref 0.7–4.0)
Lymphocytes Relative: 24.6 % (ref 12.0–46.0)
MCHC: 34.2 g/dL (ref 30.0–36.0)
MCV: 86.4 fl (ref 78.0–100.0)
Monocytes Absolute: 0.5 10*3/uL (ref 0.1–1.0)
Monocytes Relative: 10.3 % (ref 3.0–12.0)
Neutro Abs: 3.1 10*3/uL (ref 1.4–7.7)
Neutrophils Relative %: 62.4 % (ref 43.0–77.0)
Platelets: 198 10*3/uL (ref 150.0–400.0)
RBC: 3.89 Mil/uL (ref 3.87–5.11)
RDW: 14.1 % (ref 11.5–15.5)
WBC: 4.9 10*3/uL (ref 4.0–10.5)

## 2015-01-10 LAB — IBC PANEL
Iron: 79 ug/dL (ref 42–145)
SATURATION RATIOS: 20.4 % (ref 20.0–50.0)
TRANSFERRIN: 276 mg/dL (ref 212.0–360.0)

## 2015-01-10 LAB — VITAMIN B12: Vitamin B-12: 418 pg/mL (ref 211–911)

## 2015-02-15 ENCOUNTER — Other Ambulatory Visit: Payer: Self-pay | Admitting: Internal Medicine

## 2015-03-04 DIAGNOSIS — Z1231 Encounter for screening mammogram for malignant neoplasm of breast: Secondary | ICD-10-CM | POA: Diagnosis not present

## 2015-03-04 LAB — HM MAMMOGRAPHY: HM MAMMO: NEGATIVE

## 2015-03-09 ENCOUNTER — Encounter: Payer: Self-pay | Admitting: Internal Medicine

## 2015-04-03 DIAGNOSIS — Z23 Encounter for immunization: Secondary | ICD-10-CM | POA: Diagnosis not present

## 2015-04-06 ENCOUNTER — Encounter: Payer: Self-pay | Admitting: Internal Medicine

## 2015-04-12 ENCOUNTER — Telehealth: Payer: Self-pay

## 2015-04-12 NOTE — Telephone Encounter (Signed)
Patient states she will call back to schedule an appointment. Dr Olevia Perches pt.

## 2015-05-10 ENCOUNTER — Telehealth: Payer: Self-pay | Admitting: Internal Medicine

## 2015-05-10 ENCOUNTER — Other Ambulatory Visit: Payer: Self-pay

## 2015-05-10 MED ORDER — BUPROPION HCL ER (SR) 100 MG PO TB12: 100.0000 mg | ORAL_TABLET | Freq: Every day | ORAL | Status: DC

## 2015-05-10 NOTE — Telephone Encounter (Signed)
Pt requesting refill for buPROPion (WELLBUTRIN SR) 100 MG 12 hr tablet [id number]  Pharmacy is [facility] [employer] on [address]

## 2015-05-19 ENCOUNTER — Telehealth: Payer: Self-pay | Admitting: *Deleted

## 2015-05-19 MED ORDER — LEVOTHYROXINE SODIUM 75 MCG PO TABS: ORAL_TABLET | ORAL | Status: DC

## 2015-05-19 MED ORDER — LISINOPRIL 20 MG PO TABS: 20.0000 mg | ORAL_TABLET | Freq: Every day | ORAL | Status: DC

## 2015-05-19 NOTE — Telephone Encounter (Signed)
Received call  Pt states she will be needing refills on his Levothyroxine & Lisinopril. Have appt schedule to see Dr. Quay Burow but its not until March. Inform to [facility].../lmb

## 2015-06-06 DIAGNOSIS — H6121 Impacted cerumen, right ear: Secondary | ICD-10-CM | POA: Diagnosis not present

## 2015-07-22 DIAGNOSIS — L821 Other seborrheic keratosis: Secondary | ICD-10-CM | POA: Diagnosis not present

## 2015-07-22 DIAGNOSIS — D1801 Hemangioma of skin and subcutaneous tissue: Secondary | ICD-10-CM | POA: Diagnosis not present

## 2015-07-22 DIAGNOSIS — Z85828 Personal history of other malignant neoplasm of skin: Secondary | ICD-10-CM | POA: Diagnosis not present

## 2015-07-22 DIAGNOSIS — L309 Dermatitis, unspecified: Secondary | ICD-10-CM | POA: Diagnosis not present

## 2015-08-16 ENCOUNTER — Other Ambulatory Visit: Payer: Self-pay | Admitting: Internal Medicine

## 2015-08-18 ENCOUNTER — Telehealth: Payer: Self-pay

## 2015-08-18 NOTE — Telephone Encounter (Signed)
Call to [name] and educated regarding AWV; Agreed to come in prior to seeing Dr. Quay Burow, which is her preference;

## 2015-08-19 ENCOUNTER — Encounter: Payer: Self-pay | Admitting: Internal Medicine

## 2015-08-19 ENCOUNTER — Telehealth: Payer: Self-pay | Admitting: Internal Medicine

## 2015-08-19 ENCOUNTER — Ambulatory Visit (INDEPENDENT_AMBULATORY_CARE_PROVIDER_SITE_OTHER): Payer: Medicare Other | Admitting: Internal Medicine

## 2015-08-19 ENCOUNTER — Other Ambulatory Visit (INDEPENDENT_AMBULATORY_CARE_PROVIDER_SITE_OTHER): Payer: Medicare Other

## 2015-08-19 VITALS — BP 160/80 | HR 65 | Temp 97.5°F | Ht <= 58 in | Wt 151.0 lb

## 2015-08-19 DIAGNOSIS — E038 Other specified hypothyroidism: Secondary | ICD-10-CM

## 2015-08-19 DIAGNOSIS — I10 Essential (primary) hypertension: Secondary | ICD-10-CM

## 2015-08-19 DIAGNOSIS — N812 Incomplete uterovaginal prolapse: Secondary | ICD-10-CM | POA: Diagnosis not present

## 2015-08-19 DIAGNOSIS — M810 Age-related osteoporosis without current pathological fracture: Secondary | ICD-10-CM | POA: Diagnosis not present

## 2015-08-19 DIAGNOSIS — F419 Anxiety disorder, unspecified: Secondary | ICD-10-CM

## 2015-08-19 DIAGNOSIS — F418 Other specified anxiety disorders: Secondary | ICD-10-CM

## 2015-08-19 DIAGNOSIS — F329 Major depressive disorder, single episode, unspecified: Secondary | ICD-10-CM

## 2015-08-19 DIAGNOSIS — Z0000 Encounter for general adult medical examination without abnormal findings: Secondary | ICD-10-CM | POA: Diagnosis not present

## 2015-08-19 DIAGNOSIS — F32A Depression, unspecified: Secondary | ICD-10-CM

## 2015-08-19 LAB — CBC WITH DIFFERENTIAL/PLATELET
Basophils Absolute: 0 10*3/uL (ref 0.0–0.1)
Basophils Relative: 0.6 % (ref 0.0–3.0)
EOS PCT: 1.9 % (ref 0.0–5.0)
Eosinophils Absolute: 0.1 10*3/uL (ref 0.0–0.7)
HCT: 36.5 % (ref 36.0–46.0)
HEMOGLOBIN: 12.2 g/dL (ref 12.0–15.0)
Lymphocytes Relative: 21.2 % (ref 12.0–46.0)
Lymphs Abs: 1.2 10*3/uL (ref 0.7–4.0)
MCHC: 33.5 g/dL (ref 30.0–36.0)
MCV: 86.5 fl (ref 78.0–100.0)
MONOS PCT: 8.5 % (ref 3.0–12.0)
Monocytes Absolute: 0.5 10*3/uL (ref 0.1–1.0)
Neutro Abs: 3.9 10*3/uL (ref 1.4–7.7)
Neutrophils Relative %: 67.8 % (ref 43.0–77.0)
Platelets: 254 10*3/uL (ref 150.0–400.0)
RBC: 4.22 Mil/uL (ref 3.87–5.11)
RDW: 14 % (ref 11.5–15.5)
WBC: 5.7 10*3/uL (ref 4.0–10.5)

## 2015-08-19 LAB — COMPREHENSIVE METABOLIC PANEL
ALBUMIN: 4.5 g/dL (ref 3.5–5.2)
ALK PHOS: 83 U/L (ref 39–117)
ALT: 12 U/L (ref 0–35)
AST: 15 U/L (ref 0–37)
BILIRUBIN TOTAL: 0.6 mg/dL (ref 0.2–1.2)
BUN: 19 mg/dL (ref 6–23)
CO2: 25 mEq/L (ref 19–32)
Calcium: 9.9 mg/dL (ref 8.4–10.5)
Chloride: 101 mEq/L (ref 96–112)
Creatinine, Ser: 1.15 mg/dL (ref 0.40–1.20)
GFR: 47.6 mL/min — ABNORMAL LOW (ref >60.00)
Glucose, Bld: 99 mg/dL (ref 70–99)
POTASSIUM: 4.2 meq/L (ref 3.5–5.1)
SODIUM: 135 meq/L (ref 135–145)
TOTAL PROTEIN: 7.3 g/dL (ref 6.0–8.3)

## 2015-08-19 LAB — TSH: TSH: 2.98 u[IU]/mL (ref 0.35–4.50)

## 2015-08-19 MED ORDER — LISINOPRIL 20 MG PO TABS: 10.0000 mg | ORAL_TABLET | Freq: Every day | ORAL | Status: DC

## 2015-08-19 NOTE — Telephone Encounter (Signed)
Pt called check up on the lab result, she also wants a copy of the the blood work. Please call her back

## 2015-08-19 NOTE — Assessment & Plan Note (Signed)
Check tsh  Titrate med dose if needed  

## 2015-08-19 NOTE — Assessment & Plan Note (Signed)
Check cmp 

## 2015-08-19 NOTE — Progress Notes (Signed)
Subjective:    Patient ID: [name], female    DOB: 04-04-1928, 82 y.o.   MRN: [id number]  HPI  She is here to establish with a new pcp.    Depression, anxiety:  She was placed on the wellbutrin at some point for possible depression.  She would like to come off of the medication because she does not feel she needs it.  Her daughter thinks the depression was not truly depression - it was more related to having h pylori, not eating and being sick at that time.  She denies anxiety on a daily basis, but on occasion she will feel anxious. When her husband died she was prescribed lorazepam and only took 0.25 mg a few times and it worked well.  Dr Linna Darner did not want to prescribe it to her again and they discussed the concerns with the medication.  She wonders if she could try it again.     She has felt a bulge coming out from the vaginal and wonders if she has a prolapse.  She denies difficulty with urination, having a bowel movement and denies pain or bleeding.    Hypertension: She is taking her medication daily. She is compliant with a low sodium diet.  She denies chest pain, palpitations, shortness of breath and regular headaches. She is not exercising regularly.  She does not monitor her blood pressure at home - her cuff is not easy to use.    Osteoporosis:  She is unsure if she wants to have a dexa.  She is taking vitamin D daily.  She tries to get her calcium through her diet, but does not work at it and is unsure how much she gets. She is not exercising.    Hypothyroidism:  She is taking her medication daily.  She denies any recent changes in energy or weight that are unexplained.   Nerve pain in jaw:  She has chronic nerve pain in her jaw.  She typically take one tylenol a day and one advil a day.  The advil works better and she would like to take two advil a day, but knows she needs to be careful of her kidneys.    Medications and allergies reviewed with patient and updated if  appropriate.  Patient Active Problem List   Diagnosis Date Noted  . Hallucination, visual 11/08/2013  . Depression 07/27/2013  . Generalized weakness 07/18/2013  . Anxiety and depression 07/18/2013  . Nodule of left lung 07/18/2013  . Nausea alone 07/18/2013  . Normocytic anemia 07/18/2013  . Hyponatremia 06/27/2013  . HYPERTROPHIC CARDIOMYOPATHY 08/23/2009  . ABNORMAL ELECTROCARDIOGRAM 07/18/2009  . UNSPECIFIED VITAMIN D DEFICIENCY 01/21/2009  . RENAL INSUFFICIENCY 01/21/2009  . History of colonic polyps 01/21/2009  . ANEMIA-NOS 01/21/2009  . MYCOSIS FUNGOIDES 05/21/2008  . DIVERTICULOSIS, COLON 05/21/2008  . OSTEOARTHRITIS 06/02/2007  . Hypothyroidism 04/28/2007  . HYPERLIPIDEMIA 04/28/2007  . METABOLIC SYNDROME X [id number]  . Essential hypertension 04/28/2007  . OSTEOPOROSIS 04/28/2007  . FIBROMYALGIA 11/14/2006    Current Outpatient Prescriptions on File Prior to Visit  Medication Sig Dispense Refill  . acetaminophen (TYLENOL) 325 MG tablet Take 650 mg by mouth 3 (three) times daily.    Marland Kitchen aspirin 81 MG chewable tablet Chew 81 mg by mouth every morning.    Marland Kitchen buPROPion (WELLBUTRIN SR) 100 MG 12 hr tablet TAKE 1 TABLET (100 MG TOTAL) BY MOUTH DAILY. --PATIENT NEEDS OFFICE VISIT BEFORE ANY FURTHER REFILLS 30 tablet 0  . cholecalciferol (VITAMIN  D) 1000 UNITS tablet Take 2,000 Units by mouth every morning.    Marland Kitchen levothyroxine (SYNTHROID, LEVOTHROID) 75 MCG tablet Take 37.5-75 mcg by mouth daily before breakfast. Take half of a tablet on Tuesdays and Saturdays. On all other days take 1 tablet    . levothyroxine (SYNTHROID, LEVOTHROID) 75 MCG tablet take 1 tablet by mouth daily with the exception of tuesdays and saturdays take 1/2 tablet as directed. 72 tablet 0  . lisinopril (PRINIVIL,ZESTRIL) 20 MG tablet Take 1 tablet (20 mg total) by mouth daily. 90 tablet 0  . LORazepam (ATIVAN) 0.5 MG tablet Take 0.5 mg by mouth as needed for anxiety.    Marland Kitchen omeprazole (PRILOSEC) 20 MG capsule  Take one po BID x 2 weeks then daily. 104 capsule 1  . beta carotene w/minerals (OCUVITE) tablet Take 1 tablet by mouth every morning.     No current facility-administered medications on file prior to visit.    Past Medical History  Diagnosis Date  . Dysmetabolic syndrome X   . Hyperlipidemia   . Other abnormal glucose   . Hypothyroidism   . DIVERTICULOSIS, COLON   . Hyperplastic colon polyp   . FIBROMYALGIA   . HYPERLIPIDEMIA   . HYPERTENSION, ESSENTIAL NOS   . HYPERTROPHIC CARDIOMYOPATHY   . Hyponatremia   . MYCOSIS FUNGOIDES   . OSTEOARTHRITIS   . OSTEOPOROSIS   . RENAL INSUFFICIENCY   . Unspecified vitamin D deficiency   . Nodule of left lung   . Normocytic anemia   . Adrenal myelolipoma   . Renal angiomyolipoma   . Gastric ulcer due to Helicobacter pylori   . Hiatal hernia     Past Surgical History  Procedure Laterality Date  . G 4 p 2    . Colonoscopy with polypectomy  07/25/2007    Dr Olevia Perches, Recheck in 7 years for 2009    Social History   Social History  . Marital Status: Married    Spouse Name: N/A  . Number of Children: 2  . Years of Education: N/A   Occupational History  . Retired    Social History Main Topics  . Smoking status: Former Smoker    Quit date: 06/05/1963  . Smokeless tobacco: Never Used     Comment: smoked 1951-1965 , up to 1/2 ppd  . Alcohol Use: No  . Drug Use: No  . Sexual Activity: Not on file   Other Topics Concern  . Not on file   Social History Narrative   Widowed.  Lives alone.  Ambulates without assistance.  Steady on feet.    Family History  Problem Relation Age of Onset  . Diabetes Mother   . Heart attack Maternal Uncle 48    his 2 sons had MI in 33s  . Hypertension Maternal Grandmother   . Stroke Maternal Grandmother 69  . Cancer Neg Hx   . Asthma Neg Hx   . COPD Neg Hx   . Diabetes Mother     Review of Systems  Constitutional: Negative for fever and appetite change.  Respiratory: Negative for cough,  shortness of breath and wheezing.   Cardiovascular: Positive for leg swelling (occasional in evening). Negative for chest pain and palpitations.  Gastrointestinal: Negative for nausea and abdominal pain.       No gerd  Neurological: Negative for dizziness, light-headedness and headaches.       Objective:   Filed Vitals:   08/19/15 1015  BP: 170/80  Pulse: 65  Temp: 97.5 F (  36.4 C)   Filed Weights   08/19/15 1015  Weight: 151 lb (68.493 kg)   Body mass index is 31.57 kg/(m^2).   Physical Exam Constitutional: Appears well-developed and well-nourished. No distress.  Neck: Neck supple. No tracheal deviation present. No thyromegaly present.  No carotid bruit. No cervical adenopathy.   Cardiovascular: Faint heart sounds, Normal rate, regular rhythm and normal heart sounds.   No murmur heard.  Trace nonpitting edema Pulmonary/Chest: Effort normal and breath sounds normal. No respiratory distress. No wheezes.  GU: deferred - referred Psych: normal mood and affect       Assessment & Plan:   Possible uterovaginal prolapse by history  - will refer to urogyn for further evaluation  See Problem List for Assessment and Plan of chronic medical problems.  Follow up in 6 months

## 2015-08-19 NOTE — Progress Notes (Signed)
Subjective:   [name] is a 82 y.o. female who presents for Medicare Annual (Subsequent) preventive examination.  Review of Systems:   HRA assessment completed during visit; [name]  The Patient was informed that this wellness visit is to identify risk and educate on how to reduce risk for increase disease through lifestyle changes.   ROS deferred to CPE exam with physician  Medical and family hx Mother DM;  Maternal Uncle; MI Maternal: HTN; Stroke   Medical issues  Hyperlipidemia; Chol 223; Trig 108; HDL 64; LDL 137; Ratio 3 (A1c 5.3)  Fibro; no complaints  HTN: BP up today; will check again prior to leaving but taking  bp pill  Will check BP at home and call if it remains elevated  Osteopenic; will discuss with the patient but is not interested in having dexa / given a list of foods with amount of calcium in a serving; Taking Vit D 2000 u   BMI: 31.6;  Diet;  Cereal in am with graham crackers; sometimes bacon and eggs Lunch cottage cheese; tomatoes and cucumber; soup Supper; has greens or meats on occasion.   Exercise;  Did like to walk; takes trash out Stays active; does her own house keeping;   SAFETY; one level;  Safety reviewed for the home; very independent  Removal of clutter clearing paths through the home,  Railing as needed; refuses railing but may consider in bathroom Bathroom safety; tub is low and steps over it to get in; declines chair  Community safety; yes Smoke detectors yes Firearms safety / no firearms Driving accidents and seatbelt/ no Sun protection/ yes Stressors / gets anxious on occasion but overall does well   Medication review/ New meds  Fall assessment / no falls this year; fell 2 years ago in tight space. Educated on not climbing; keep space between walking areas and careful on un-level ground outside;  Gait assessment; walks well  Fall prevention education provided   Mobilization and Functional losses in the last  year. No; still independent in ADL's and IADLs Stairs: no issues  No urinary leakage;  Bowels no issues   Memory loss; no issues; word search; likes to read  AD8 score 0 except for recall of names on occasion  Sleep patterns good except up x 1 to 2 to the bathroom; has a nightlight Goes to bathroom; has nightlight  Refuses helpline at this time; does have a cell phone in bathroom when showering;    Counseling: Colonoscopy; 06/2007; q 7 to 10 years/ aged out per dr. Olevia [name]   EKG: 07/2013 Mammogram 02/2015  Dexa: osteopenia and repeat (-1.7) actually done in 2008 / declines at present but will consider GYN referral per Dr. Quay [name] Hearing: right 4000 right ear 2000 / declines hearing screen Ophthalmology exam; annually; cataract surgery this summer; Dr. Herbert [name]  Immunizations none needed; up to date  Advanced Directive; The patient has an advanced directive but is not sure what is in it. The dtr will find and review; Given information from [name] on HCPOA and Living Will   Health advice or referrals Stay safe; no climbing;  Check BP; monitor sodium in foods To monitor food intake of calcium to 1200mg  per day To start to exercise and walk more during the week; Has low weights at home and will use these as well / understands this will help balance; slow the progression of osteopenia as well as prevent future falls.  Current Care Team reviewed and updated  Objective:     Vitals: BP 160/80 mmHg  Pulse 65  Temp(Src) 97.5 F (36.4 C) (Oral)  Ht 4\' 10"  (1.473 m)  Wt 151 lb (68.493 kg)  BMI 31.57 kg/m2  SpO2 98%  Tobacco History  Smoking status  . Former Smoker  . Quit date: 06/05/1963  Smokeless tobacco  . Never Used    Comment: smoked 1951-1965 , up to 1/2 ppd     Counseling given: Not Answered   Past Medical History  Diagnosis Date  . Dysmetabolic syndrome X   . Hyperlipidemia   . Other abnormal glucose   . Hypothyroidism   . DIVERTICULOSIS, COLON   .  Hyperplastic colon polyp   . FIBROMYALGIA   . HYPERLIPIDEMIA   . HYPERTENSION, ESSENTIAL NOS   . HYPERTROPHIC CARDIOMYOPATHY   . Hyponatremia   . MYCOSIS FUNGOIDES   . OSTEOARTHRITIS   . OSTEOPOROSIS   . RENAL INSUFFICIENCY   . Unspecified vitamin D deficiency   . Nodule of left lung   . Normocytic anemia   . Adrenal myelolipoma   . Renal angiomyolipoma   . Gastric ulcer due to Helicobacter pylori   . Hiatal hernia    Past Surgical History  Procedure Laterality Date  . G 4 p 2    . Colonoscopy with polypectomy  07/25/2007    Dr [name], Recheck in 7 years for 2009   Family History  Problem Relation Age of Onset  . Diabetes Mother   . Heart attack Maternal Uncle 38    his 2 sons had MI in 42s  . Hypertension Maternal Grandmother   . Stroke Maternal Grandmother 69  . Cancer Neg Hx   . Asthma Neg Hx   . COPD Neg Hx   . Diabetes Mother    History  Sexual Activity  . Sexual Activity: Not on file    Outpatient Encounter Prescriptions as of 08/19/2015  Medication Sig  . acetaminophen (TYLENOL) 325 MG tablet Take 650 mg by mouth 3 (three) times daily.  Marland Kitchen aspirin 81 MG chewable tablet Chew 81 mg by mouth every morning.  . cholecalciferol (VITAMIN D) 1000 UNITS tablet Take 2,000 Units by mouth every morning.  Marland Kitchen levothyroxine (SYNTHROID, LEVOTHROID) 75 MCG tablet take 1 tablet by mouth daily with the exception of tuesdays and saturdays take 1/2 tablet as directed.  Marland Kitchen lisinopril (PRINIVIL,ZESTRIL) 20 MG tablet Take 0.5 tablets (10 mg total) by mouth daily.  Marland Kitchen omeprazole (PRILOSEC) 20 MG capsule Take one po BID x 2 weeks then daily.  . [DISCONTINUED] buPROPion (WELLBUTRIN SR) 100 MG 12 hr tablet TAKE 1 TABLET (100 MG TOTAL) BY MOUTH DAILY. --PATIENT NEEDS OFFICE VISIT BEFORE ANY FURTHER REFILLS  . [DISCONTINUED] levothyroxine (SYNTHROID, LEVOTHROID) 75 MCG tablet Take 37.5-75 mcg by mouth daily before breakfast. Take half of a tablet on Tuesdays and Saturdays. On all other days  take 1 tablet  . [DISCONTINUED] lisinopril (PRINIVIL,ZESTRIL) 20 MG tablet Take 1 tablet (20 mg total) by mouth daily.  . [DISCONTINUED] LORazepam (ATIVAN) 0.5 MG tablet Take 0.5 mg by mouth as needed for anxiety.  . beta carotene w/minerals (OCUVITE) tablet Take 1 tablet by mouth every morning.   No facility-administered encounter medications on file as of 08/19/2015.    Activities of Daily Living In your present state of health, do you have any difficulty performing the following activities: 08/19/2015  Hearing? N  Vision? Y  Walking or climbing stairs? N  Dressing or bathing? N  Doing errands, shopping? N  Preparing Food and eating ? N  Using the Toilet? N    Patient Care Team: Binnie Rail, MD as PCP - General (Internal Medicine)    Assessment:     Exercise Activities and Dietary recommendations    Goals    None     Fall Risk Fall Risk  08/19/2015 08/06/2014 06/26/2013  Falls in the past year? No Yes No  Number falls in past yr: - 2 or more -  Injury with Fall? - Yes -   Depression Screen PHQ 2/9 Scores 08/19/2015 08/06/2014 06/26/2013  PHQ - 2 Score 0 0 0     Cognitive Testing No flowsheet data found.   No issues; does read books; does some puzzles and is socially engaged   Immunization History  Administered Date(s) Administered  . Influenza Whole 06/04/2002, 04/24/2006, 03/11/2012  . Influenza-Unspecified 03/04/2013, 04/03/2015  . Pneumococcal Conjugate-13 08/06/2014  . Pneumococcal Polysaccharide-23 06/05/2007  . Td 06/04/2000, 02/25/2001, 06/27/2010  . Zoster 07/16/2011   Screening Tests Health Maintenance  Topic Date Due  . DEXA SCAN  01/31/1995  . INFLUENZA VACCINE  01/03/2016  . TETANUS/TDAP  06/27/2020  . ZOSTAVAX  Completed  . PNA vac Low Risk Adult  Completed      Plan:     Will consider dexa scan  Goals;  1. Will try to eat 1200 mg of calcium a day and given food list 2. Agreed to walk a little more  To continue weight bearing exercise as  discussed    During the course of the visit the patient was educated and counseled about the following appropriate screening and preventive services:   Vaccines to include Pneumoccal, Influenza, Hepatitis B, Td, Zostavax, HCV/ up to date  Electrocardiogram 07/2013  Cardiovascular Disease/ will check BP at home  Colorectal cancer screening/ aged out per Dr. Olevia [name] last visit  Bone density screening/ declined today and will consider  Diabetes screening/neg  Glaucoma screening/ annual eye exams; neg  Mammography/02/2015; due 02/2016   Nutrition counseling / discussed sodium and cutting back on sweets to lose a few pounds.  Will also start walking with dtt  Patient Instructions (the written plan) was given to the patient.   Wynetta Fines, RN  08/19/2015     Medical screening examination/treatment/procedure(s) were performed by non-physician practitioner and as supervising physician I was immediately available for consultation/collaboration. I agree with above. Binnie Rail, MD

## 2015-08-19 NOTE — Patient Instructions (Addendum)
   Test(s) ordered today. Your results will be released to [place] (or called to you) after review, usually within 72hours after test completion. If any changes need to be made, you will be notified at that same time.  All other Health Maintenance issues reviewed.   All recommended immunizations and age-appropriate screenings are up-to-date.  No immunizations administered today.   Think about having a bone density test done.    Medications reviewed and updated.  Changes include stopping the wellbutrin.    A referral for urogyn was ordered.     Please followup in 6 months   Ms. Bobby Rumpf , Thank you for taking time to come for your Medicare Wellness Visit. I appreciate your ongoing commitment to your health goals. Please review the following plan we discussed and let me know if I can assist you in the future.   Will consider dexa scan  Goals;  1. Will try to eat 1200 mg of calcium a day and given food list 2. Agreed to walk a little more  To continue weight bearing exercise as discussed   These are the goals we discussed: Goals    None      This is a list of the screening recommended for you and due dates:  Health Maintenance  Topic Date Due  . DEXA scan (bone density measurement)  01/31/1995  . Flu Shot  01/03/2016  . Tetanus Vaccine  06/27/2020  . Shingles Vaccine  Completed  . Pneumonia vaccines  Completed

## 2015-08-19 NOTE — Assessment & Plan Note (Signed)
Does not feel depression and would like to come off the wellbutrin She will stop the medication and see how she does

## 2015-08-19 NOTE — Telephone Encounter (Signed)
Spoke with pt to inform that labs were not resulted by MD yet.

## 2015-08-19 NOTE — Assessment & Plan Note (Signed)
Occasional anxiety - would like ativan at home just in case, but I would like to avoid She understands risk of falling and memory issues Will monitor for now

## 2015-08-19 NOTE — Assessment & Plan Note (Signed)
BP elevated here today She will start checking at home Check cmp If BP elevated at home will titrate medication

## 2015-08-19 NOTE — Assessment & Plan Note (Signed)
She is unsure if she wants a dexa or if she would consider medication -she will think about it Continue vitamin D Goal is 1200 mg of calcium a day Increase walking

## 2015-08-24 ENCOUNTER — Encounter: Payer: Self-pay | Admitting: Internal Medicine

## 2015-08-24 ENCOUNTER — Other Ambulatory Visit: Payer: Self-pay | Admitting: Internal Medicine

## 2015-08-30 NOTE — Telephone Encounter (Signed)
Pt request copy of the lab result to be mail to her. Please help

## 2015-08-31 NOTE — Telephone Encounter (Signed)
mailed

## 2015-09-09 DIAGNOSIS — Z85828 Personal history of other malignant neoplasm of skin: Secondary | ICD-10-CM | POA: Diagnosis not present

## 2015-09-09 DIAGNOSIS — L309 Dermatitis, unspecified: Secondary | ICD-10-CM | POA: Diagnosis not present

## 2015-09-09 DIAGNOSIS — L821 Other seborrheic keratosis: Secondary | ICD-10-CM | POA: Diagnosis not present

## 2015-10-10 ENCOUNTER — Other Ambulatory Visit: Payer: Self-pay | Admitting: Internal Medicine

## 2015-10-10 DIAGNOSIS — H25013 Cortical age-related cataract, bilateral: Secondary | ICD-10-CM | POA: Diagnosis not present

## 2015-10-10 DIAGNOSIS — H35033 Hypertensive retinopathy, bilateral: Secondary | ICD-10-CM | POA: Diagnosis not present

## 2015-10-10 DIAGNOSIS — H2513 Age-related nuclear cataract, bilateral: Secondary | ICD-10-CM | POA: Diagnosis not present

## 2015-10-10 DIAGNOSIS — H353130 Nonexudative age-related macular degeneration, bilateral, stage unspecified: Secondary | ICD-10-CM | POA: Diagnosis not present

## 2015-10-11 NOTE — Telephone Encounter (Signed)
FX faxed to [facility]

## 2015-10-11 NOTE — Telephone Encounter (Signed)
Please advise, do not see on pts current med list.

## 2015-10-11 NOTE — Telephone Encounter (Signed)
She has been on this before  -  We discussed the concerns with this medication at her visit.  I will prescribe a very small supply -- I want her to take 1/2 of a pill only and use very infrequently

## 2015-11-07 DIAGNOSIS — R350 Frequency of micturition: Secondary | ICD-10-CM | POA: Diagnosis not present

## 2015-11-07 DIAGNOSIS — R351 Nocturia: Secondary | ICD-10-CM | POA: Diagnosis not present

## 2015-11-07 DIAGNOSIS — N819 Female genital prolapse, unspecified: Secondary | ICD-10-CM | POA: Diagnosis not present

## 2015-12-12 DIAGNOSIS — H2513 Age-related nuclear cataract, bilateral: Secondary | ICD-10-CM | POA: Diagnosis not present

## 2015-12-12 DIAGNOSIS — H25013 Cortical age-related cataract, bilateral: Secondary | ICD-10-CM | POA: Diagnosis not present

## 2015-12-12 DIAGNOSIS — H18423 Band keratopathy, bilateral: Secondary | ICD-10-CM | POA: Diagnosis not present

## 2016-01-04 DIAGNOSIS — H2512 Age-related nuclear cataract, left eye: Secondary | ICD-10-CM | POA: Diagnosis not present

## 2016-01-04 DIAGNOSIS — H25812 Combined forms of age-related cataract, left eye: Secondary | ICD-10-CM | POA: Diagnosis not present

## 2016-01-04 DIAGNOSIS — H25012 Cortical age-related cataract, left eye: Secondary | ICD-10-CM | POA: Diagnosis not present

## 2016-01-05 DIAGNOSIS — H25011 Cortical age-related cataract, right eye: Secondary | ICD-10-CM | POA: Diagnosis not present

## 2016-01-05 DIAGNOSIS — H2511 Age-related nuclear cataract, right eye: Secondary | ICD-10-CM | POA: Diagnosis not present

## 2016-01-07 ENCOUNTER — Emergency Department (HOSPITAL_COMMUNITY): Payer: Medicare Other

## 2016-01-07 ENCOUNTER — Encounter (HOSPITAL_COMMUNITY): Payer: Self-pay | Admitting: Oncology

## 2016-01-07 ENCOUNTER — Inpatient Hospital Stay (HOSPITAL_COMMUNITY)
Admission: EM | Admit: 2016-01-07 | Discharge: 2016-01-09 | DRG: 512 | Disposition: A | Discharge status: Home / Self Care | Payer: Medicare Other | Attending: Internal Medicine | Admitting: Internal Medicine

## 2016-01-07 DIAGNOSIS — M25521 Pain in right elbow: Secondary | ICD-10-CM | POA: Diagnosis not present

## 2016-01-07 DIAGNOSIS — K449 Diaphragmatic hernia without obstruction or gangrene: Secondary | ICD-10-CM | POA: Diagnosis present

## 2016-01-07 DIAGNOSIS — Z7982 Long term (current) use of aspirin: Secondary | ICD-10-CM

## 2016-01-07 DIAGNOSIS — W109XXA Fall (on) (from) unspecified stairs and steps, initial encounter: Secondary | ICD-10-CM | POA: Diagnosis present

## 2016-01-07 DIAGNOSIS — S42401A Unspecified fracture of lower end of right humerus, initial encounter for closed fracture: Secondary | ICD-10-CM

## 2016-01-07 DIAGNOSIS — R600 Localized edema: Secondary | ICD-10-CM | POA: Diagnosis not present

## 2016-01-07 DIAGNOSIS — S52031B Displaced fracture of olecranon process with intraarticular extension of right ulna, initial encounter for open fracture type I or II: Secondary | ICD-10-CM

## 2016-01-07 DIAGNOSIS — F418 Other specified anxiety disorders: Secondary | ICD-10-CM | POA: Diagnosis not present

## 2016-01-07 DIAGNOSIS — S52021A Displaced fracture of olecranon process without intraarticular extension of right ulna, initial encounter for closed fracture: Secondary | ICD-10-CM | POA: Diagnosis not present

## 2016-01-07 DIAGNOSIS — Z87891 Personal history of nicotine dependence: Secondary | ICD-10-CM | POA: Diagnosis not present

## 2016-01-07 DIAGNOSIS — G8918 Other acute postprocedural pain: Secondary | ICD-10-CM | POA: Diagnosis not present

## 2016-01-07 DIAGNOSIS — S52021C Displaced fracture of olecranon process without intraarticular extension of right ulna, initial encounter for open fracture type IIIA, IIIB, or IIIC: Secondary | ICD-10-CM

## 2016-01-07 DIAGNOSIS — Z9842 Cataract extraction status, left eye: Secondary | ICD-10-CM | POA: Diagnosis not present

## 2016-01-07 DIAGNOSIS — F329 Major depressive disorder, single episode, unspecified: Secondary | ICD-10-CM | POA: Diagnosis present

## 2016-01-07 DIAGNOSIS — N183 Chronic kidney disease, stage 3 unspecified: Secondary | ICD-10-CM | POA: Diagnosis present

## 2016-01-07 DIAGNOSIS — D649 Anemia, unspecified: Secondary | ICD-10-CM | POA: Diagnosis not present

## 2016-01-07 DIAGNOSIS — R011 Cardiac murmur, unspecified: Secondary | ICD-10-CM | POA: Diagnosis present

## 2016-01-07 DIAGNOSIS — W1789XA Other fall from one level to another, initial encounter: Secondary | ICD-10-CM | POA: Diagnosis present

## 2016-01-07 DIAGNOSIS — S52021B Displaced fracture of olecranon process without intraarticular extension of right ulna, initial encounter for open fracture type I or II: Secondary | ICD-10-CM | POA: Diagnosis not present

## 2016-01-07 DIAGNOSIS — Z961 Presence of intraocular lens: Secondary | ICD-10-CM | POA: Diagnosis present

## 2016-01-07 DIAGNOSIS — Z888 Allergy status to other drugs, medicaments and biological substances status: Secondary | ICD-10-CM | POA: Diagnosis not present

## 2016-01-07 DIAGNOSIS — I129 Hypertensive chronic kidney disease with stage 1 through stage 4 chronic kidney disease, or unspecified chronic kidney disease: Secondary | ICD-10-CM | POA: Diagnosis present

## 2016-01-07 DIAGNOSIS — E039 Hypothyroidism, unspecified: Secondary | ICD-10-CM | POA: Diagnosis present

## 2016-01-07 DIAGNOSIS — S8011XA Contusion of right lower leg, initial encounter: Secondary | ICD-10-CM | POA: Diagnosis present

## 2016-01-07 DIAGNOSIS — S52021F Displaced fracture of olecranon process without intraarticular extension of right ulna, subsequent encounter for open fracture type IIIA, IIIB, or IIIC with routine healing: Secondary | ICD-10-CM | POA: Diagnosis not present

## 2016-01-07 DIAGNOSIS — M810 Age-related osteoporosis without current pathological fracture: Secondary | ICD-10-CM | POA: Diagnosis present

## 2016-01-07 DIAGNOSIS — S51011A Laceration without foreign body of right elbow, initial encounter: Secondary | ICD-10-CM | POA: Diagnosis not present

## 2016-01-07 DIAGNOSIS — I10 Essential (primary) hypertension: Secondary | ICD-10-CM | POA: Diagnosis present

## 2016-01-07 DIAGNOSIS — W108XXA Fall (on) (from) other stairs and steps, initial encounter: Secondary | ICD-10-CM | POA: Diagnosis not present

## 2016-01-07 DIAGNOSIS — Z881 Allergy status to other antibiotic agents status: Secondary | ICD-10-CM | POA: Diagnosis not present

## 2016-01-07 DIAGNOSIS — Z01818 Encounter for other preprocedural examination: Secondary | ICD-10-CM

## 2016-01-07 DIAGNOSIS — S52023A Displaced fracture of olecranon process without intraarticular extension of unspecified ulna, initial encounter for closed fracture: Secondary | ICD-10-CM

## 2016-01-07 DIAGNOSIS — Z9849 Cataract extraction status, unspecified eye: Secondary | ICD-10-CM

## 2016-01-07 DIAGNOSIS — N1832 Chronic kidney disease, stage 3b: Secondary | ICD-10-CM | POA: Diagnosis present

## 2016-01-07 DIAGNOSIS — E785 Hyperlipidemia, unspecified: Secondary | ICD-10-CM | POA: Diagnosis present

## 2016-01-07 DIAGNOSIS — F419 Anxiety disorder, unspecified: Secondary | ICD-10-CM | POA: Diagnosis present

## 2016-01-07 HISTORY — DX: Displaced fracture of olecranon process without intraarticular extension of unspecified ulna, initial encounter for closed fracture: S52.023A

## 2016-01-07 LAB — CBC WITH DIFFERENTIAL/PLATELET
BASOS ABS: 0 10*3/uL (ref 0.0–0.1)
Basophils Relative: 0 %
EOS ABS: 0.1 10*3/uL (ref 0.0–0.7)
EOS PCT: 1 %
HCT: 33.2 % — ABNORMAL LOW (ref 36.0–46.0)
Hemoglobin: 11.1 g/dL — ABNORMAL LOW (ref 12.0–15.0)
LYMPHS ABS: 1.1 10*3/uL (ref 0.7–4.0)
Lymphocytes Relative: 10 %
MCH: 29 pg (ref 26.0–34.0)
MCHC: 33.4 g/dL (ref 30.0–36.0)
MCV: 86.7 fL (ref 78.0–100.0)
Monocytes Absolute: 0.8 10*3/uL (ref 0.1–1.0)
Monocytes Relative: 7 %
Neutro Abs: 9.3 10*3/uL — ABNORMAL HIGH (ref 1.7–7.7)
Neutrophils Relative %: 82 %
PLATELETS: 218 10*3/uL (ref 150–400)
RBC: 3.83 MIL/uL — AB (ref 3.87–5.11)
RDW: 13.3 % (ref 11.5–15.5)
WBC: 11.3 10*3/uL — AB (ref 4.0–10.5)

## 2016-01-07 LAB — BASIC METABOLIC PANEL
Anion gap: 6 (ref 5–15)
BUN: 25 mg/dL — AB (ref 6–20)
CO2: 23 mmol/L (ref 22–32)
Calcium: 9.2 mg/dL (ref 8.9–10.3)
Chloride: 109 mmol/L (ref 101–111)
Creatinine, Ser: 0.99 mg/dL (ref 0.44–1.00)
GFR calc Af Amer: 59 mL/min — ABNORMAL LOW (ref >60)
GFR, EST NON AFRICAN AMERICAN: 51 mL/min — AB (ref >60)
Glucose, Bld: 124 mg/dL — ABNORMAL HIGH (ref 65–99)
POTASSIUM: 4.1 mmol/L (ref 3.5–5.1)
SODIUM: 138 mmol/L (ref 135–145)

## 2016-01-07 MED ORDER — CEFAZOLIN IN D5W 1 GM/50ML IV SOLN
1.0000 g | Freq: Once | INTRAVENOUS | Status: AC
  Administered 2016-01-07: 1 g via INTRAVENOUS
  Filled 2016-01-07: qty 50

## 2016-01-07 MED ORDER — LIDOCAINE-EPINEPHRINE 1 %-1:100000 IJ SOLN
INTRAMUSCULAR | Status: AC
  Administered 2016-01-07: 1 mL
  Filled 2016-01-07: qty 1

## 2016-01-07 NOTE — ED Notes (Signed)
Lidocaine pulled, at bedside.

## 2016-01-07 NOTE — ED Notes (Signed)
Hospitalist performing assessment, delay in labs

## 2016-01-07 NOTE — ED Notes (Signed)
MD at bedside for sutures 

## 2016-01-07 NOTE — ED Notes (Signed)
hospitalist at bedside

## 2016-01-07 NOTE — ED Provider Notes (Signed)
[facility] DEPT Provider Note   CSN: [id number] Arrival date & time: 01/07/16  1945  First Provider Contact:  None       History   Chief Complaint Chief Complaint  Patient presents with  . Fall    HPI [name] is a 82 y.o. female.  Right elbow wound, right lower leg pain and difficulty ambulating after the fall. No loss of consciousness, no head injury, no abdominal, back, chest or other pain secondary to fall   The history is provided by the patient and a relative.  Fall  This is a new problem. The current episode started 1 to 2 hours ago. The problem occurs constantly. The problem has not changed since onset.Pertinent negatives include no chest pain, no abdominal pain, no headaches and no shortness of breath. Nothing aggravates the symptoms. Nothing relieves the symptoms. She has tried nothing for the symptoms.    Past Medical History:  Diagnosis Date  . Adrenal myelolipoma   . DIVERTICULOSIS, COLON   . Dysmetabolic syndrome X   . FIBROMYALGIA   . Gastric ulcer due to Helicobacter pylori   . Hiatal hernia   . Hyperlipidemia   . HYPERLIPIDEMIA   . Hyperplastic colon polyp   . HYPERTENSION, ESSENTIAL NOS   . HYPERTROPHIC CARDIOMYOPATHY   . Hyponatremia   . Hypothyroidism   . MYCOSIS FUNGOIDES   . Nodule of left lung   . Normocytic anemia   . OSTEOARTHRITIS   . OSTEOPOROSIS   . Other abnormal glucose   . Renal angiomyolipoma   . RENAL INSUFFICIENCY   . Unspecified vitamin D deficiency     Patient Active Problem List   Diagnosis Date Noted  . Hallucination, visual 11/08/2013  . Depression 07/27/2013  . Generalized weakness 07/18/2013  . Anxiety and depression 07/18/2013  . Nodule of left lung 07/18/2013  . Normocytic anemia 07/18/2013  . Hyponatremia 06/27/2013  . HYPERTROPHIC CARDIOMYOPATHY 08/23/2009  . ABNORMAL ELECTROCARDIOGRAM 07/18/2009  . UNSPECIFIED VITAMIN D DEFICIENCY 01/21/2009  . RENAL INSUFFICIENCY 01/21/2009  . History of  colonic polyps 01/21/2009  . ANEMIA-NOS 01/21/2009  . MYCOSIS FUNGOIDES 05/21/2008  . DIVERTICULOSIS, COLON 05/21/2008  . OSTEOARTHRITIS 06/02/2007  . Hypothyroidism 04/28/2007  . HYPERLIPIDEMIA 04/28/2007  . METABOLIC SYNDROME X [id number]  . Essential hypertension 04/28/2007  . Osteoporosis 04/28/2007  . FIBROMYALGIA 11/14/2006    Past Surgical History:  Procedure Laterality Date  . colonoscopy with polypectomy  07/25/2007   Dr Olevia Perches, Recheck in 7 years for 2009  . G 4 P 2      OB History    No data available       Home Medications    Prior to Admission medications   Medication Sig Start Date End Date Taking? Authorizing Provider  acetaminophen (TYLENOL) 325 MG tablet Take 650 mg by mouth 3 (three) times daily.    Historical Provider, MD  aspirin 81 MG chewable tablet Chew 81 mg by mouth every morning.    Historical Provider, MD  beta carotene w/minerals (OCUVITE) tablet Take 1 tablet by mouth every morning.    Historical Provider, MD  cholecalciferol (VITAMIN D) 1000 UNITS tablet Take 2,000 Units by mouth every morning.    Historical Provider, MD  levothyroxine (SYNTHROID, LEVOTHROID) 75 MCG tablet TAKE 1 TAB BY MOUTH ONCE DAILY WITH EXCEPTION OF TUESDAYS AND SATURDAYS TAKE HALF A TAB AS DIRECTED 08/24/15   Binnie Rail, MD  lisinopril (PRINIVIL,ZESTRIL) 20 MG tablet Take 0.5 tablets (10 mg total) by mouth daily.  08/19/15   Binnie Rail, MD  lisinopril (PRINIVIL,ZESTRIL) 20 MG tablet TAKE 1 TABLET (20 MG TOTAL) BY MOUTH DAILY. 08/24/15   Binnie Rail, MD  LORazepam (ATIVAN) 0.5 MG tablet Take 0.5 tablets (0.25 mg total) by mouth daily as needed for anxiety. 10/11/15   Binnie Rail, MD  omeprazole (PRILOSEC) 20 MG capsule Take one po BID x 2 weeks then daily. 10/19/14   Lafayette Dragon, MD    Family History Family History  Problem Relation Age of Onset  . Diabetes Mother   . Heart attack Maternal Uncle 30    his 2 sons had MI in 69s  . Hypertension Maternal Grandmother     . Stroke Maternal Grandmother 69  . Cancer Neg Hx   . Asthma Neg Hx   . COPD Neg Hx     Social History Social History  Substance Use Topics  . Smoking status: Former Smoker    Quit date: 06/05/1963  . Smokeless tobacco: Never Used     Comment: smoked 1951-1965 , up to 1/2 ppd  . Alcohol use No     Allergies   Achromycin [tetracycline hcl]; Omnipen [ampicillin]; Simvastatin; Celecoxib; Clarithromycin; Ezetimibe-simvastatin; Flagyl [metronidazole]; and Abilify [aripiprazole]   Review of Systems Review of Systems  Respiratory: Negative for shortness of breath.   Cardiovascular: Negative for chest pain.  Gastrointestinal: Negative for abdominal pain.  Skin: Positive for wound.  Neurological: Negative for headaches.  All other systems reviewed and are negative.    Physical Exam Updated Vital Signs BP 180/77 (BP Location: Left Arm)   Pulse 74   Temp 97.5 F (36.4 C) (Oral)   Resp 20   Ht 4\' 9"  (1.448 m)   Wt 155 lb (70.3 kg)   SpO2 98%   BMI 33.54 kg/m   Physical Exam  Constitutional: She is oriented to person, place, and time. She appears well-developed and well-nourished. No distress.  HENT:  Head: Normocephalic and atraumatic.  Eyes: Conjunctivae are normal.  Neck: Neck supple. No tracheal deviation present.  Cardiovascular: Normal rate and regular rhythm.   Pulmonary/Chest: Effort normal. No respiratory distress.  Abdominal: Soft. She exhibits no distension.  Musculoskeletal:       Right forearm: She exhibits deformity and laceration (deep, likely violating joint space ).       Right lower leg: She exhibits tenderness and swelling (with bruising evident). She exhibits no deformity and no laceration.  0/5 strength of elbow extension on affected arm  Neurological: She is alert and oriented to person, place, and time.  Skin: Skin is warm and dry.  Psychiatric: She has a normal mood and affect.       ED Treatments / Results  Labs (all labs ordered are  listed, but only abnormal results are displayed) Labs Reviewed  CBC WITH DIFFERENTIAL/PLATELET - Abnormal; Notable for the following:       Result Value   WBC 11.3 (*)    RBC 3.83 (*)    Hemoglobin 11.1 (*)    HCT 33.2 (*)    Neutro Abs 9.3 (*)    All other components within normal limits  BASIC METABOLIC PANEL - Abnormal; Notable for the following:    Glucose, Bld 124 (*)    BUN 25 (*)    GFR calc non Af Amer 51 (*)    GFR calc Af Amer 59 (*)    All other components within normal limits    EKG  EKG Interpretation None  Radiology Dg Elbow Complete Right  Result Date: 01/07/2016 CLINICAL DATA:  Fall off porch tonight landing on right side with right elbow pain. Laceration. EXAM: RIGHT ELBOW - COMPLETE 3+ VIEW COMPARISON:  None. FINDINGS: Significantly displaced olecranon fracture with of 1.8 cm osseous distraction at the articular surface and proximal migration of the olecranon process. Proximal radius and distal humerus are intact. Soft tissue injury with laceration/air about the proximal ulna, proximal to the fracture site. IMPRESSION: Significantly displaced intra-articular olecranon fracture. Soft tissue injury with laceration/air adjacent to the proximal ulna, proximal to the fracture site. Electronically Signed   By: Jeb Levering M.D.   On: 01/07/2016 20:58   Dg Tibia/fibula Right  Result Date: 01/07/2016 CLINICAL DATA:  Fall off porch landing on right side with right calf swelling EXAM: RIGHT TIBIA AND FIBULA - 2 VIEW COMPARISON:  None. FINDINGS: There is no evidence of fracture or other focal bone lesions. Knee and ankle alignment is maintained. There is diffuse soft tissue edema of the lower leg. IMPRESSION: Soft tissue edema.  No fracture of the right lower leg. Electronically Signed   By: Jeb Levering M.D.   On: 01/07/2016 20:59    Procedures Procedures (including critical care time)  LACERATION REPAIR Performed by: Leo Grosser Authorized by: Leo Grosser Consent: Verbal consent obtained. Risks and benefits: risks, benefits and alternatives were discussed Consent given by: patient Patient identity confirmed: provided demographic data Prepped and Draped in normal sterile fashion Wound explored  Laceration Location: right elbow  Laceration Length: 4 cm  No Foreign Bodies seen or palpated  Anesthesia: local infiltration  Local anesthetic: lidocaine 1% w epinephrine  Anesthetic total: 8 ml  Irrigation method: syringe Amount of cleaning: standard  Skin closure: 3-0 prolene  Number of sutures: 3  Technique: loose approximation, simple interrupted  Patient tolerance: Patient tolerated the procedure well with no immediate complications.   Medications Ordered in ED Medications  ceFAZolin (ANCEF) IVPB 1 g/50 mL premix (0 g Intravenous Stopped 01/08/16 0021)  lidocaine-EPINEPHrine (XYLOCAINE W/EPI) 1 %-1:100000 (with pres) injection (1 mL  Given by Other 01/07/16 2350)     Initial Impression / Assessment and Plan / ED Course  I have reviewed the triage vital signs and the nursing notes.  Pertinent labs & imaging results that were available during my care of the patient were reviewed by me and considered in my medical decision making (see chart for details).  Clinical Course   82 y.o. female presents with Fall from a porch onto her right elbow and leg. No head injury or loss of consciousness. No other signs of injury except for swelling and bruising of the right lower extremity over the tibia and fibula area. No bony injury is identified on x-ray of that limb. She has a gaping laceration to her right elbow and a possible open intra-articular fracture of the olecranon with loss of extensor mechanism.  Orthopedics was consult that as the patient will likely require washout and repair of this injury to the elbow. They recommended loose closure and antibiotics, requested transfer to [facility] for operative repair in the morning.  Hospitalist was consulted for admission and will see the patient in the emergency department.   Final Clinical Impressions(s) / ED Diagnoses   Final diagnoses:  Open intra-articular fracture of olecranon, right, type I or II, initial encounter  Contusion of right lower leg, initial encounter    New Prescriptions New Prescriptions   No medications on file     Quillian Quince  Laneta Simmers, MD 01/08/16 [id number]

## 2016-01-07 NOTE — ED Triage Notes (Signed)
Per pt she was fell approximately 2 feet off of her porch landing on her right side after being startled by a snake.  Pt has a 7.5 cm x 1 cm laceration to right elbow.  Right calf is swollen, can bear weight on leg, pedal pulses palpable.  Denies hitting head, neck pain or LOC.  Denies anticoagulants.  Rates pain to right arm 10/10, right leg 10/10, lower back 5/10.

## 2016-01-07 NOTE — H&P (Signed)
History and Physical    [name] [id number] DOB: [birth year] DOA: 01/07/2016  PCP: Binnie Rail, MD Consultants:  Elijio Miles - GI Patient coming from: home - lives alone; NOK: Collene Gobble, [phone]  Chief Complaint: fall  HPI: [name] is a 82 y.o. female with medical history significant of HTN, hypothyroidism, anxiety, and recent cataract surgery (last Wednesday) presenting following a fall.  Patient reports that she went outside to get fresh air.  She was near the steps and saw a snake and went in to call her neighbor.  The neighbor came and removed the snake, but she went back out to spray bug spray where the snake was and she tripped on cement and fell.  Thinks she fell down 2 steps and landed on right side.  Injured both elbow and leg.  Recently had L caratact surgery (Wednesday) and does not think there was damage to that; due to have additional surgery on R eye on Aug 16.  Small cut on right hand, sore overall already.   ED Course: Given Ancef, plan for wound approximation prior to transfer.  Ortho consult with Dr. Percell Miller who requests hospitalist admission with transfer to [facility] for operative repair tomorrow   Review of Systems: As per HPI; otherwise 10 point review of systems reviewed and negative.   Ambulatory Status:  Ambulates independently  Past Medical History:  Diagnosis Date  . Adrenal myelolipoma   . DIVERTICULOSIS, COLON   . Dysmetabolic syndrome X   . FIBROMYALGIA   . Gastric ulcer due to Helicobacter pylori   . Hiatal hernia   . HYPERLIPIDEMIA   . Hyperplastic colon polyp   . HYPERTENSION, ESSENTIAL NOS   . HYPERTROPHIC CARDIOMYOPATHY   . Hyponatremia   . Hypothyroidism   . MYCOSIS FUNGOIDES   . Nodule of left lung   . Normocytic anemia   . OSTEOARTHRITIS   . OSTEOPOROSIS   . Other abnormal glucose   . Renal angiomyolipoma   . RENAL INSUFFICIENCY   . Unspecified vitamin D deficiency     Past Surgical History:  Procedure  Laterality Date  . colonoscopy with polypectomy  07/25/2007   Dr Olevia Perches, Recheck in 7 years for 2009  . G 4 P 2      Social History   Social History  . Marital status: Married    Spouse name: N/A  . Number of children: 2  . Years of education: N/A   Occupational History  . Retired Retired   Social History Main Topics  . Smoking status: Former Smoker    Quit date: 06/05/1963  . Smokeless tobacco: Never Used     Comment: smoked 1951-1965 , up to 1/2 ppd  . Alcohol use No  . Drug use: No  . Sexual activity: Not on file   Other Topics Concern  . Not on file   Social History Narrative   Widowed.  Lives alone.  Ambulates without assistance.  Steady on feet.    Allergies  Allergen Reactions  . Achromycin [Tetracycline Hcl] Swelling and Other (See Comments)    Reaction:  Facial swelling   . Omnipen [Ampicillin] Anaphylaxis and Other (See Comments)    Has patient had a PCN reaction causing immediate rash, facial/tongue/throat swelling, SOB or lightheadedness with hypotension: Yes Has patient had a PCN reaction causing severe rash involving mucus membranes or skin necrosis: No Has patient had a PCN reaction that required hospitalization No Has patient had a PCN reaction occurring within  the last 10 years: No If all of the above answers are "NO", then may proceed with Cephalosporin use.  Marland Kitchen Penicillins Anaphylaxis and Other (See Comments)    Has patient had a PCN reaction causing immediate rash, facial/tongue/throat swelling, SOB or lightheadedness with hypotension: Yes Has patient had a PCN reaction causing severe rash involving mucus membranes or skin necrosis: No Has patient had a PCN reaction that required hospitalization No Has patient had a PCN reaction occurring within the last 10 years: No If all of the above answers are "NO", then may proceed with Cephalosporin use.  . Simvastatin Other (See Comments)    Reaction:  Muscle pain   . Celecoxib Diarrhea  . Clarithromycin  Other (See Comments)    Reaction:  Unknown   . Ezetimibe-Simvastatin Diarrhea, Nausea And Vomiting and Other (See Comments)    Reaction:  Muscle pain   . Flagyl [Metronidazole] Other (See Comments)    Reaction:  Unknown   . Abilify [Aripiprazole] Other (See Comments)    Reaction:  Insomnia     Family History  Problem Relation Age of Onset  . Diabetes Mother   . Heart attack Maternal Uncle 23    his 2 sons had MI in 57s  . Hypertension Maternal Grandmother   . Stroke Maternal Grandmother 69  . Cancer Neg Hx   . Asthma Neg Hx   . COPD Neg Hx     Prior to Admission medications   Medication Sig Start Date End Date Taking? Authorizing Provider  acetaminophen (TYLENOL) 325 MG tablet Take 650 mg by mouth 3 (three) times daily.    Historical Provider, MD  aspirin 81 MG chewable tablet Chew 81 mg by mouth every morning.    Historical Provider, MD  beta carotene w/minerals (OCUVITE) tablet Take 1 tablet by mouth every morning.    Historical Provider, MD  cholecalciferol (VITAMIN D) 1000 UNITS tablet Take 2,000 Units by mouth every morning.    Historical Provider, MD  levothyroxine (SYNTHROID, LEVOTHROID) 75 MCG tablet TAKE 1 TAB BY MOUTH ONCE DAILY WITH EXCEPTION OF TUESDAYS AND SATURDAYS TAKE HALF A TAB AS DIRECTED 08/24/15   Binnie Rail, MD  lisinopril (PRINIVIL,ZESTRIL) 20 MG tablet Take 0.5 tablets (10 mg total) by mouth daily. 08/19/15   Binnie Rail, MD  lisinopril (PRINIVIL,ZESTRIL) 20 MG tablet TAKE 1 TABLET (20 MG TOTAL) BY MOUTH DAILY. 08/24/15   Binnie Rail, MD  LORazepam (ATIVAN) 0.5 MG tablet Take 0.5 tablets (0.25 mg total) by mouth daily as needed for anxiety. 10/11/15   Binnie Rail, MD  omeprazole (PRILOSEC) 20 MG capsule Take one po BID x 2 weeks then daily. 10/19/14   Lafayette Dragon, MD    Physical Exam: Vitals:   01/07/16 1959 01/07/16 2325  BP: 180/77 (!) 132/50  Pulse: 74 72  Resp: 20 19  Temp: 97.5 F (36.4 C)   TempSrc: Oral   SpO2: 98% 97%  Weight: 70.3 kg  (155 lb)   Height: 4\' 9"  (1.448 m)      General:  Appears calm and comfortable and is NAD; very pleasant and appropriate Eyes:  PERRL, EOMI, normal lids, iris ENT:  grossly normal hearing, lips & tongue, mmm Neck:  no LAD, masses or thyromegaly Cardiovascular:  RRR, no m/r/g. No LE edema.  Respiratory:  CTA bilaterally, no w/r/r. Normal respiratory effort. Abdomen:  soft, ntnd, NABS Skin: approximately 3-4 cm along right elbow with hemostasis at the time of my evaluation; small excoriations on  thumb and first 2 fingers on right hand; ecchymosis extending along much of mid-lateral left shin Musculoskeletal: normal on left; able to move fingers on right and to elevate arm from shoulder joint, normal right leg Psychiatric:  grossly normal mood and affect, speech fluent and appropriate, AOx3, cognitively intact Neurologic:  CN 2-12 grossly intact, moves all extremities in coordinated fashion, sensation intact  Labs on Admission: I have personally reviewed following labs and imaging studies  CBC:  Recent Labs Lab 01/07/16 2236  WBC 11.3*  NEUTROABS 9.3*  HGB 11.1*  HCT 33.2*  MCV 86.7  PLT 99991111   Basic Metabolic Panel:  Recent Labs Lab 01/07/16 2236  NA 138  K 4.1  CL 109  CO2 23  GLUCOSE 124*  BUN 25*  CREATININE 0.99  CALCIUM 9.2   GFR: Estimated Creatinine Clearance: 33.6 mL/min (by C-G formula based on SCr of 0.99 mg/dL). Liver Function Tests: No results for input(s): AST, ALT, ALKPHOS, BILITOT, PROT, ALBUMIN in the last 168 hours. No results for input(s): LIPASE, AMYLASE in the last 168 hours. No results for input(s): AMMONIA in the last 168 hours. Coagulation Profile: No results for input(s): INR, PROTIME in the last 168 hours. Cardiac Enzymes: No results for input(s): CKTOTAL, CKMB, CKMBINDEX, TROPONINI in the last 168 hours. BNP (last 3 results) No results for input(s): PROBNP in the last 8760 hours. HbA1C: No results for input(s): HGBA1C in the last 72  hours. CBG: No results for input(s): GLUCAP in the last 168 hours. Lipid Profile: No results for input(s): CHOL, HDL, LDLCALC, TRIG, CHOLHDL, LDLDIRECT in the last 72 hours. Thyroid Function Tests: No results for input(s): TSH, T4TOTAL, FREET4, T3FREE, THYROIDAB in the last 72 hours. Anemia Panel: No results for input(s): VITAMINB12, FOLATE, FERRITIN, TIBC, IRON, RETICCTPCT in the last 72 hours. Urine analysis:    Component Value Date/Time   COLORURINE YELLOW 11/08/2013 2123   APPEARANCEUR CLEAR 11/08/2013 2123   LABSPEC 1.008 11/08/2013 2123   PHURINE 6.5 11/08/2013 2123   GLUCOSEU NEGATIVE 11/08/2013 2123   HGBUR NEGATIVE 11/08/2013 2123   BILIRUBINUR NEGATIVE 11/08/2013 2123   KETONESUR NEGATIVE 11/08/2013 2123   PROTEINUR NEGATIVE 11/08/2013 2123   UROBILINOGEN 0.2 11/08/2013 2123   NITRITE NEGATIVE 11/08/2013 2123   LEUKOCYTESUR TRACE (A) 11/08/2013 2123    Creatinine Clearance: Estimated Creatinine Clearance: 33.6 mL/min (by C-G formula based on SCr of 0.99 mg/dL).  Sepsis Labs: @LABRCNTIP (procalcitonin:4,lacticidven:4) )No results found for this or any previous visit (from the past 240 hour(s)).   Radiological Exams on Admission: Dg Elbow Complete Right  Result Date: 01/07/2016 CLINICAL DATA:  Fall off porch tonight landing on right side with right elbow pain. Laceration. EXAM: RIGHT ELBOW - COMPLETE 3+ VIEW COMPARISON:  None. FINDINGS: Significantly displaced olecranon fracture with of 1.8 cm osseous distraction at the articular surface and proximal migration of the olecranon process. Proximal radius and distal humerus are intact. Soft tissue injury with laceration/air about the proximal ulna, proximal to the fracture site. IMPRESSION: Significantly displaced intra-articular olecranon fracture. Soft tissue injury with laceration/air adjacent to the proximal ulna, proximal to the fracture site. Electronically Signed   By: Jeb Levering M.D.   On: 01/07/2016 20:58   Dg  Tibia/fibula Right  Result Date: 01/07/2016 CLINICAL DATA:  Fall off porch landing on right side with right calf swelling EXAM: RIGHT TIBIA AND FIBULA - 2 VIEW COMPARISON:  None. FINDINGS: There is no evidence of fracture or other focal bone lesions. Knee and ankle alignment is maintained. There  is diffuse soft tissue edema of the lower leg. IMPRESSION: Soft tissue edema.  No fracture of the right lower leg. Electronically Signed   By: Jeb Levering M.D.   On: 01/07/2016 20:59    EKG: Not done  Assessment/Plan Principal Problem:   Olecranon fracture Active Problems:   Hypothyroidism   Essential hypertension   Chronic kidney disease (CKD), stage III (moderate)   Osteoporosis   ANEMIA-NOS   Anxiety and depression   Hx of cataract surgery    Olecranon fracture, significantly displaced, intra-articular -ER doctor to address laceration prior to transfer -Dr. Percell Miller requests transfer to [facility] under hospitalist service -Used modified hip fracture order set -Pain control with tylenol, morphine as needed -Ancef coverage until OR, pharmacy to dose  HTN -Elevated on presentation, likely pain-related -Continue Lisinopril -Will follow  Hypothyroidism -TSH normal in 3/17 -Continue Synthroid at current dose  CKD -Creatinine actually better than usual baseline of 1.1 -Still with GFR <60 -Will follow  Osteoporosis -Only taking vitamin D as an outpatient -Consider addition of calcium and possibly bisphosphonate therapy (uncertain if she has taken in the past)  Anemia -Hgb stable despite fall and blood loss with laceration -Normocytic, possibly related to renal disease -Will follow  Anxiety -Takes Xanax prn -Will continue  Cataract surgery -Just last Wednesday -Still using drops, will continue -For right eye surgery on 8/16, will have to see if this needs delay based on clinical course  DVT prophylaxis:  SCDs prior to OR Code Status: Full - confirmed with  patient/family Family Communication: Daughter present throughout evalaution Disposition Plan: To be determined Consults called: Orthopedics  Admission status: Admit to Med Surg    Karmen Bongo MD [facility]  If 7PM-7AM, please contact night-coverage [email or web address] Password [facility]  01/08/2016, 12:39 AM

## 2016-01-08 ENCOUNTER — Inpatient Hospital Stay (HOSPITAL_COMMUNITY): Payer: Medicare Other

## 2016-01-08 ENCOUNTER — Inpatient Hospital Stay (HOSPITAL_COMMUNITY): Payer: Medicare Other | Admitting: Anesthesiology

## 2016-01-08 ENCOUNTER — Inpatient Hospital Stay: Admit: 2016-01-08 | Payer: Self-pay | Admitting: Orthopedic Surgery

## 2016-01-08 ENCOUNTER — Encounter (HOSPITAL_COMMUNITY): Payer: Self-pay | Admitting: Certified Registered"

## 2016-01-08 ENCOUNTER — Encounter (HOSPITAL_COMMUNITY)
Admission: EM | Disposition: A | Discharge status: Home / Self Care | Payer: Self-pay | Source: Home / Self Care | Attending: Internal Medicine

## 2016-01-08 DIAGNOSIS — Z9849 Cataract extraction status, unspecified eye: Secondary | ICD-10-CM

## 2016-01-08 DIAGNOSIS — R011 Cardiac murmur, unspecified: Secondary | ICD-10-CM | POA: Diagnosis present

## 2016-01-08 HISTORY — PX: I & D EXTREMITY: SHX5045

## 2016-01-08 HISTORY — DX: Cataract extraction status, unspecified eye: Z98.49

## 2016-01-08 HISTORY — PX: ORIF ELBOW FRACTURE: SHX5031

## 2016-01-08 LAB — SURGICAL PCR SCREEN
MRSA, PCR: NEGATIVE
STAPHYLOCOCCUS AUREUS: NEGATIVE

## 2016-01-08 LAB — CBC
HEMATOCRIT: 32 % — AB (ref 36.0–46.0)
HEMOGLOBIN: 10.1 g/dL — AB (ref 12.0–15.0)
MCH: 28.4 pg (ref 26.0–34.0)
MCHC: 31.6 g/dL (ref 30.0–36.0)
MCV: 89.9 fL (ref 78.0–100.0)
Platelets: 191 10*3/uL (ref 150–400)
RBC: 3.56 MIL/uL — AB (ref 3.87–5.11)
RDW: 13.3 % (ref 11.5–15.5)
WBC: 7.9 10*3/uL (ref 4.0–10.5)

## 2016-01-08 LAB — CREATININE, SERUM
CREATININE: 1.15 mg/dL — AB (ref 0.44–1.00)
GFR calc Af Amer: 49 mL/min — ABNORMAL LOW (ref >60)
GFR, EST NON AFRICAN AMERICAN: 42 mL/min — AB (ref >60)

## 2016-01-08 SURGERY — OPEN REDUCTION INTERNAL FIXATION (ORIF) ELBOW/OLECRANON FRACTURE
Anesthesia: General | Site: Arm Lower | Laterality: Right

## 2016-01-08 MED ORDER — METOCLOPRAMIDE HCL 5 MG/ML IJ SOLN: 5.0000 mg | Freq: Three times a day (TID) | INTRAMUSCULAR | Status: DC | PRN

## 2016-01-08 MED ORDER — DOCUSATE SODIUM 100 MG PO CAPS: 100.0000 mg | ORAL_CAPSULE | Freq: Two times a day (BID) | ORAL | 0 refills | Status: DC

## 2016-01-08 MED ORDER — FENTANYL CITRATE (PF) 100 MCG/2ML IJ SOLN: 25.0000 ug | INTRAMUSCULAR | Status: DC | PRN

## 2016-01-08 MED ORDER — ONDANSETRON HCL 4 MG PO TABS: 4.0000 mg | ORAL_TABLET | Freq: Four times a day (QID) | ORAL | Status: DC | PRN

## 2016-01-08 MED ORDER — LIDOCAINE-EPINEPHRINE (PF) 1.5 %-1:200000 IJ SOLN
INTRAMUSCULAR | Status: DC | PRN
  Administered 2016-01-08: 10 mL via PERINEURAL

## 2016-01-08 MED ORDER — ONDANSETRON HCL 4 MG/2ML IJ SOLN
INTRAMUSCULAR | Status: DC | PRN
  Administered 2016-01-08: 4 mg via INTRAVENOUS

## 2016-01-08 MED ORDER — ONDANSETRON HCL 4 MG/2ML IJ SOLN
INTRAMUSCULAR | Status: AC
  Filled 2016-01-08: qty 2

## 2016-01-08 MED ORDER — ENOXAPARIN SODIUM 40 MG/0.4ML ~~LOC~~ SOLN
40.0000 mg | SUBCUTANEOUS | Status: DC
  Administered 2016-01-09: 40 mg via SUBCUTANEOUS
  Filled 2016-01-08: qty 0.4

## 2016-01-08 MED ORDER — MIDAZOLAM HCL 2 MG/2ML IJ SOLN
INTRAMUSCULAR | Status: AC
  Filled 2016-01-08: qty 2

## 2016-01-08 MED ORDER — HYDROCODONE-ACETAMINOPHEN 5-325 MG PO TABS
1.0000 | ORAL_TABLET | Freq: Four times a day (QID) | ORAL | Status: DC | PRN
  Administered 2016-01-08: 1 via ORAL
  Administered 2016-01-08: 2 via ORAL
  Administered 2016-01-08: 1 via ORAL
  Administered 2016-01-08 – 2016-01-09 (×4): 2 via ORAL
  Filled 2016-01-08 (×4): qty 2
  Filled 2016-01-08 (×2): qty 1
  Filled 2016-01-08: qty 2

## 2016-01-08 MED ORDER — DOCUSATE SODIUM 100 MG PO CAPS
100.0000 mg | ORAL_CAPSULE | Freq: Two times a day (BID) | ORAL | Status: DC
  Administered 2016-01-08 – 2016-01-09 (×3): 100 mg via ORAL
  Filled 2016-01-08 (×2): qty 1

## 2016-01-08 MED ORDER — HYDROCODONE-ACETAMINOPHEN 5-325 MG PO TABS: 1.0000 | ORAL_TABLET | ORAL | 0 refills | Status: DC | PRN

## 2016-01-08 MED ORDER — PROPOFOL 10 MG/ML IV BOLUS
INTRAVENOUS | Status: AC
  Filled 2016-01-08: qty 20

## 2016-01-08 MED ORDER — FENTANYL CITRATE (PF) 250 MCG/5ML IJ SOLN
INTRAMUSCULAR | Status: AC
  Filled 2016-01-08: qty 5

## 2016-01-08 MED ORDER — LEVOTHYROXINE SODIUM 75 MCG PO TABS: 37.5000 ug | ORAL_TABLET | ORAL | Status: DC

## 2016-01-08 MED ORDER — VANCOMYCIN HCL IN DEXTROSE 1-5 GM/200ML-% IV SOLN
INTRAVENOUS | Status: AC
  Filled 2016-01-08: qty 200

## 2016-01-08 MED ORDER — PANTOPRAZOLE SODIUM 40 MG PO TBEC
40.0000 mg | DELAYED_RELEASE_TABLET | Freq: Every day | ORAL | Status: DC
  Administered 2016-01-08 – 2016-01-09 (×2): 40 mg via ORAL
  Filled 2016-01-08 (×2): qty 1

## 2016-01-08 MED ORDER — ACETAMINOPHEN 650 MG RE SUPP: 650.0000 mg | Freq: Four times a day (QID) | RECTAL | Status: DC | PRN

## 2016-01-08 MED ORDER — OMEPRAZOLE 20 MG PO CPDR: 20.0000 mg | DELAYED_RELEASE_CAPSULE | Freq: Every day | ORAL | 5 refills | Status: DC

## 2016-01-08 MED ORDER — KETOROLAC TROMETHAMINE 15 MG/ML IJ SOLN
7.5000 mg | Freq: Four times a day (QID) | INTRAMUSCULAR | Status: AC
  Administered 2016-01-08 – 2016-01-09 (×4): 7.5 mg via INTRAVENOUS
  Filled 2016-01-08 (×4): qty 1

## 2016-01-08 MED ORDER — LIDOCAINE HCL (CARDIAC) 20 MG/ML IV SOLN
INTRAVENOUS | Status: DC | PRN
  Administered 2016-01-08: 60 mg via INTRAVENOUS

## 2016-01-08 MED ORDER — LISINOPRIL 20 MG PO TABS
20.0000 mg | ORAL_TABLET | Freq: Every day | ORAL | Status: DC
  Administered 2016-01-08: 20 mg via ORAL
  Filled 2016-01-08 (×2): qty 1

## 2016-01-08 MED ORDER — PROPOFOL 10 MG/ML IV BOLUS
INTRAVENOUS | Status: DC | PRN
  Administered 2016-01-08: 80 mg via INTRAVENOUS

## 2016-01-08 MED ORDER — FENTANYL CITRATE (PF) 250 MCG/5ML IJ SOLN
INTRAMUSCULAR | Status: DC | PRN
  Administered 2016-01-08: 25 ug via INTRAVENOUS
  Administered 2016-01-08: 50 ug via INTRAVENOUS

## 2016-01-08 MED ORDER — ASPIRIN 81 MG PO CHEW
81.0000 mg | CHEWABLE_TABLET | Freq: Every day | ORAL | Status: DC
  Administered 2016-01-08 – 2016-01-09 (×2): 81 mg via ORAL
  Filled 2016-01-08 (×2): qty 1

## 2016-01-08 MED ORDER — MORPHINE SULFATE (PF) 2 MG/ML IV SOLN: 0.5000 mg | INTRAVENOUS | Status: DC | PRN

## 2016-01-08 MED ORDER — NEPAFENAC 0.1 % OP SUSP
1.0000 [drp] | Freq: Every day | OPHTHALMIC | Status: DC
  Administered 2016-01-08 – 2016-01-09 (×2): 1 [drp] via OPHTHALMIC
  Filled 2016-01-08: qty 3

## 2016-01-08 MED ORDER — LIDOCAINE 2% (20 MG/ML) 5 ML SYRINGE
INTRAMUSCULAR | Status: AC
  Filled 2016-01-08: qty 5

## 2016-01-08 MED ORDER — DEXTROSE-NACL 5-0.45 % IV SOLN
100.0000 mL/h | INTRAVENOUS | Status: DC
  Administered 2016-01-08: 100 mL/h via INTRAVENOUS

## 2016-01-08 MED ORDER — CEFAZOLIN SODIUM 1 G IJ SOLR
INTRAMUSCULAR | Status: AC
  Filled 2016-01-08: qty 20

## 2016-01-08 MED ORDER — CEFAZOLIN SODIUM-DEXTROSE 2-4 GM/100ML-% IV SOLN
INTRAVENOUS | Status: AC
  Filled 2016-01-08: qty 100

## 2016-01-08 MED ORDER — LEVOTHYROXINE SODIUM 75 MCG PO TABS
75.0000 ug | ORAL_TABLET | ORAL | Status: DC
  Administered 2016-01-08 – 2016-01-09 (×2): 75 ug via ORAL
  Filled 2016-01-08 (×2): qty 1

## 2016-01-08 MED ORDER — LACTATED RINGERS IV SOLN
INTRAVENOUS | Status: DC
  Administered 2016-01-08: 50 mL via INTRAVENOUS

## 2016-01-08 MED ORDER — PREDNISOLONE ACETATE 1 % OP SUSP
1.0000 [drp] | Freq: Four times a day (QID) | OPHTHALMIC | Status: DC
  Administered 2016-01-08 – 2016-01-09 (×6): 1 [drp] via OPHTHALMIC
  Filled 2016-01-08: qty 1

## 2016-01-08 MED ORDER — CEFAZOLIN IN D5W 1 GM/50ML IV SOLN
1.0000 g | Freq: Two times a day (BID) | INTRAVENOUS | Status: DC
  Administered 2016-01-08: 1 g via INTRAVENOUS
  Filled 2016-01-08 (×2): qty 50

## 2016-01-08 MED ORDER — CHLORHEXIDINE GLUCONATE 4 % EX LIQD
60.0000 mL | Freq: Once | CUTANEOUS | Status: AC
  Administered 2016-01-08: 4 via TOPICAL
  Filled 2016-01-08: qty 15

## 2016-01-08 MED ORDER — ACETAMINOPHEN 500 MG PO TABS
1000.0000 mg | ORAL_TABLET | Freq: Once | ORAL | Status: AC
  Administered 2016-01-08: 1000 mg via ORAL
  Filled 2016-01-08: qty 2

## 2016-01-08 MED ORDER — ACETAMINOPHEN 325 MG PO TABS
650.0000 mg | ORAL_TABLET | Freq: Four times a day (QID) | ORAL | Status: DC | PRN
  Administered 2016-01-09: 650 mg via ORAL
  Filled 2016-01-08: qty 2

## 2016-01-08 MED ORDER — FENTANYL CITRATE (PF) 100 MCG/2ML IJ SOLN
INTRAMUSCULAR | Status: AC
  Administered 2016-01-08: 50 ug
  Filled 2016-01-08: qty 2

## 2016-01-08 MED ORDER — PROSIGHT PO TABS
1.0000 | ORAL_TABLET | Freq: Every day | ORAL | Status: DC
  Administered 2016-01-08 – 2016-01-09 (×2): 1 via ORAL
  Filled 2016-01-08 (×2): qty 1

## 2016-01-08 MED ORDER — LACTATED RINGERS IV SOLN
INTRAVENOUS | Status: DC
Start: 2016-01-08 — End: 2016-01-08
  Administered 2016-01-08: 11:00:00 via INTRAVENOUS

## 2016-01-08 MED ORDER — OFLOXACIN 0.3 % OP SOLN
1.0000 [drp] | Freq: Four times a day (QID) | OPHTHALMIC | Status: DC
  Administered 2016-01-08 – 2016-01-09 (×6): 1 [drp] via OPHTHALMIC
  Filled 2016-01-08: qty 5

## 2016-01-08 MED ORDER — CEFAZOLIN IN D5W 1 GM/50ML IV SOLN
1.0000 g | Freq: Four times a day (QID) | INTRAVENOUS | Status: AC
  Administered 2016-01-08 – 2016-01-09 (×3): 1 g via INTRAVENOUS
  Filled 2016-01-08 (×3): qty 50

## 2016-01-08 MED ORDER — ONDANSETRON HCL 4 MG/2ML IJ SOLN: 4.0000 mg | Freq: Four times a day (QID) | INTRAMUSCULAR | Status: DC | PRN

## 2016-01-08 MED ORDER — POVIDONE-IODINE 10 % EX SWAB
2.0000 "application " | Freq: Once | CUTANEOUS | Status: AC
  Administered 2016-01-08: 2 via TOPICAL

## 2016-01-08 MED ORDER — CEFAZOLIN SODIUM-DEXTROSE 2-3 GM-% IV SOLR
INTRAVENOUS | Status: DC | PRN
  Administered 2016-01-08: 2 g via INTRAVENOUS

## 2016-01-08 MED ORDER — SUCCINYLCHOLINE CHLORIDE 20 MG/ML IJ SOLN
INTRAMUSCULAR | Status: DC | PRN
  Administered 2016-01-08: 100 mg via INTRAVENOUS

## 2016-01-08 MED ORDER — BUPIVACAINE HCL (PF) 0.5 % IJ SOLN
INTRAMUSCULAR | Status: DC | PRN
  Administered 2016-01-08: 20 mL via PERINEURAL

## 2016-01-08 MED ORDER — SODIUM CHLORIDE 0.9 % IR SOLN
Status: DC | PRN
  Administered 2016-01-08 (×2): 3000 mL

## 2016-01-08 MED ORDER — METOCLOPRAMIDE HCL 5 MG PO TABS: 5.0000 mg | ORAL_TABLET | Freq: Three times a day (TID) | ORAL | Status: DC | PRN

## 2016-01-08 MED ORDER — LACTATED RINGERS IV SOLN: INTRAVENOUS | Status: DC

## 2016-01-08 MED ORDER — ONDANSETRON HCL 4 MG PO TABS: 4.0000 mg | ORAL_TABLET | Freq: Three times a day (TID) | ORAL | 0 refills | Status: DC | PRN

## 2016-01-08 MED ORDER — BUPIVACAINE HCL (PF) 0.25 % IJ SOLN
INTRAMUSCULAR | Status: AC
  Filled 2016-01-08: qty 30

## 2016-01-08 MED ORDER — CEPHALEXIN 500 MG PO CAPS: 500.0000 mg | ORAL_CAPSULE | Freq: Four times a day (QID) | ORAL | 0 refills | Status: DC

## 2016-01-08 MED ORDER — PROMETHAZINE HCL 25 MG/ML IJ SOLN: 6.2500 mg | INTRAMUSCULAR | Status: DC | PRN

## 2016-01-08 MED ORDER — ESMOLOL HCL 100 MG/10ML IV SOLN
INTRAVENOUS | Status: AC
  Filled 2016-01-08: qty 10

## 2016-01-08 MED ORDER — EPHEDRINE SULFATE 50 MG/ML IJ SOLN
INTRAMUSCULAR | Status: DC | PRN
  Administered 2016-01-08: 10 mg via INTRAVENOUS

## 2016-01-08 SURGICAL SUPPLY — 85 items
APL SKNCLS STERI-STRIP NONHPOA (GAUZE/BANDAGES/DRESSINGS)
BANDAGE ELASTIC 3 VELCRO ST LF (GAUZE/BANDAGES/DRESSINGS) ×3 IMPLANT
BANDAGE ELASTIC 4 VELCRO ST LF (GAUZE/BANDAGES/DRESSINGS) ×3 IMPLANT
BANDAGE ELASTIC 6 VELCRO ST LF (GAUZE/BANDAGES/DRESSINGS) ×3 IMPLANT
BENZOIN TINCTURE PRP APPL 2/3 (GAUZE/BANDAGES/DRESSINGS) IMPLANT
BLADE SURG 10 STRL SS (BLADE) ×3 IMPLANT
BLADE SURG ROTATE 9660 (MISCELLANEOUS) ×3 IMPLANT
BNDG CMPR 9X4 STRL LF SNTH (GAUZE/BANDAGES/DRESSINGS) ×1
BNDG COHESIVE 4X5 TAN STRL (GAUZE/BANDAGES/DRESSINGS) ×3 IMPLANT
BNDG ESMARK 4X9 LF (GAUZE/BANDAGES/DRESSINGS) ×2 IMPLANT
BNDG GAUZE ELAST 4 BULKY (GAUZE/BANDAGES/DRESSINGS) ×3 IMPLANT
BRUSH SCRUB EZ PLAIN DRY (MISCELLANEOUS) ×4 IMPLANT
CLOSURE WOUND 1/2 X4 (GAUZE/BANDAGES/DRESSINGS)
COVER SURGICAL LIGHT HANDLE (MISCELLANEOUS) ×3 IMPLANT
CUFF TOURNIQUET SINGLE 18IN (TOURNIQUET CUFF) ×3 IMPLANT
CUFF TOURNIQUET SINGLE 24IN (TOURNIQUET CUFF) ×2 IMPLANT
CUFF TOURNIQUET SINGLE 34IN LL (TOURNIQUET CUFF) IMPLANT
DRAPE C-ARM 42X72 X-RAY (DRAPES) ×3 IMPLANT
DRAPE IMP U-DRAPE 54X76 (DRAPES) ×6 IMPLANT
DRAPE INCISE IOBAN 66X45 STRL (DRAPES) IMPLANT
DRAPE ORTHO SPLIT 77X108 STRL (DRAPES) ×6
DRAPE SURG ORHT 6 SPLT 77X108 (DRAPES) ×2 IMPLANT
DRAPE U-SHAPE 47X51 STRL (DRAPES) ×3 IMPLANT
DURAPREP 26ML APPLICATOR (WOUND CARE) ×3 IMPLANT
ELECT REM PT RETURN 9FT ADLT (ELECTROSURGICAL) ×3
ELECTRODE REM PT RTRN 9FT ADLT (ELECTROSURGICAL) ×1 IMPLANT
EVACUATOR 1/8 PVC DRAIN (DRAIN) IMPLANT
FACESHIELD WRAPAROUND (MASK) ×9 IMPLANT
FACESHIELD WRAPAROUND OR TEAM (MASK) ×3 IMPLANT
GAUZE SPONGE 4X4 12PLY STRL (GAUZE/BANDAGES/DRESSINGS) ×3 IMPLANT
GAUZE XEROFORM 1X8 LF (GAUZE/BANDAGES/DRESSINGS) ×3 IMPLANT
GAUZE XEROFORM 5X9 LF (GAUZE/BANDAGES/DRESSINGS) ×3 IMPLANT
GLOVE BIO SURGEON STRL SZ7 (GLOVE) ×3 IMPLANT
GLOVE BIO SURGEON STRL SZ7.5 (GLOVE) ×3 IMPLANT
GLOVE BIOGEL PI IND STRL 7.0 (GLOVE) ×1 IMPLANT
GLOVE BIOGEL PI IND STRL 8 (GLOVE) ×1 IMPLANT
GLOVE BIOGEL PI INDICATOR 7.0 (GLOVE) ×2
GLOVE BIOGEL PI INDICATOR 8 (GLOVE) ×2
GOWN STRL REUS W/ TWL LRG LVL3 (GOWN DISPOSABLE) ×2 IMPLANT
GOWN STRL REUS W/ TWL XL LVL3 (GOWN DISPOSABLE) ×1 IMPLANT
GOWN STRL REUS W/TWL LRG LVL3 (GOWN DISPOSABLE) ×6
GOWN STRL REUS W/TWL XL LVL3 (GOWN DISPOSABLE) ×3
HANDPIECE INTERPULSE COAX TIP (DISPOSABLE)
K-WIRE DBL END TROCAR 6X.045 (WIRE) ×6
KIT BASIN OR (CUSTOM PROCEDURE TRAY) ×3 IMPLANT
KIT ROOM TURNOVER OR (KITS) ×3 IMPLANT
KWIRE DBL END TROCAR 6X.045 (WIRE) IMPLANT
MANIFOLD NEPTUNE II (INSTRUMENTS) ×3 IMPLANT
NS IRRIG 1000ML POUR BTL (IV SOLUTION) ×3 IMPLANT
PACK ORTHO EXTREMITY (CUSTOM PROCEDURE TRAY) ×3 IMPLANT
PAD ARMBOARD 7.5X6 YLW CONV (MISCELLANEOUS) ×6 IMPLANT
PAD CAST 3X4 CTTN HI CHSV (CAST SUPPLIES) IMPLANT
PAD CAST 4YDX4 CTTN HI CHSV (CAST SUPPLIES) ×1 IMPLANT
PADDING CAST COTTON 3X4 STRL (CAST SUPPLIES) ×3
PADDING CAST COTTON 4X4 STRL (CAST SUPPLIES) ×3
PENCIL BUTTON HOLSTER BLD 10FT (ELECTRODE) IMPLANT
SCRUB POVIDONE IODINE 4 OZ (MISCELLANEOUS) ×2 IMPLANT
SET CYSTO W/LG BORE CLAMP LF (SET/KITS/TRAYS/PACK) ×2 IMPLANT
SET HNDPC FAN SPRY TIP SCT (DISPOSABLE) IMPLANT
SOL PREP POV-IOD 4OZ 10% (MISCELLANEOUS) ×2 IMPLANT
SPONGE GAUZE 4X4 12PLY STER LF (GAUZE/BANDAGES/DRESSINGS) ×4 IMPLANT
SPONGE LAP 18X18 X RAY DECT (DISPOSABLE) ×3 IMPLANT
STAPLER VISISTAT 35W (STAPLE) ×3 IMPLANT
STOCKINETTE IMPERVIOUS 9X36 MD (GAUZE/BANDAGES/DRESSINGS) ×3 IMPLANT
STRIP CLOSURE SKIN 1/2X4 (GAUZE/BANDAGES/DRESSINGS) IMPLANT
SUCTION FRAZIER HANDLE 10FR (MISCELLANEOUS) ×2
SUCTION TUBE FRAZIER 10FR DISP (MISCELLANEOUS) ×1 IMPLANT
SUT ETHILON 3 0 PS 1 (SUTURE) ×2 IMPLANT
SUT FIBER WIRE 4.0 (SUTURE) ×4 IMPLANT
SUT MNCRL AB 4-0 PS2 18 (SUTURE) ×3 IMPLANT
SUT MON AB 2-0 CT1 36 (SUTURE) ×3 IMPLANT
SUT VIC AB 0 CT1 27 (SUTURE) ×3
SUT VIC AB 0 CT1 27XBRD ANBCTR (SUTURE) ×1 IMPLANT
SUT VIC AB 2-0 CT1 27 (SUTURE) ×6
SUT VIC AB 2-0 CT1 TAPERPNT 27 (SUTURE) IMPLANT
SYR CONTROL 10ML LL (SYRINGE) IMPLANT
TOWEL OR 17X24 6PK STRL BLUE (TOWEL DISPOSABLE) ×3 IMPLANT
TOWEL OR 17X26 10 PK STRL BLUE (TOWEL DISPOSABLE) ×3 IMPLANT
TOWEL OR NON WOVEN STRL DISP B (DISPOSABLE) ×3 IMPLANT
TUBE ANAEROBIC SPECIMEN COL (MISCELLANEOUS) IMPLANT
TUBE CONNECTING 12'X1/4 (SUCTIONS) ×1
TUBE CONNECTING 12X1/4 (SUCTIONS) ×2 IMPLANT
UNDERPAD 30X30 INCONTINENT (UNDERPADS AND DIAPERS) ×3 IMPLANT
WATER STERILE IRR 1000ML POUR (IV SOLUTION) ×3 IMPLANT
YANKAUER SUCT BULB TIP NO VENT (SUCTIONS) ×3 IMPLANT

## 2016-01-08 NOTE — Interval H&P Note (Signed)
History and Physical Interval Note:  01/08/2016 11:49 AM  [name]  has presented today for surgery, with the diagnosis of right olecranon fracture  The various methods of treatment have been discussed with the patient and family. After consideration of risks, benefits and other options for treatment, the patient has consented to  Procedure(s): OPEN REDUCTION INTERNAL FIXATION (ORIF) ELBOW/OLECRANON FRACTURE (Right) IRRIGATION AND CLOSURE OF OLECRANON FRACTURE (Right) as a surgical intervention .  The patient's history has been reviewed, patient examined, no change in status, stable for surgery.  I have reviewed the patient's chart and labs.  Questions were answered to the patient's satisfaction.     Sricharan Lacomb D

## 2016-01-08 NOTE — Anesthesia Procedure Notes (Signed)
Anesthesia Regional Block:  Supraclavicular block  Pre-Anesthetic Checklist: ,, timeout performed, Correct Patient, Correct Site, Correct Laterality, Correct Procedure, Correct Position, site marked, Risks and benefits discussed,  Surgical consent,  Pre-op evaluation,  At surgeon's request and post-op pain management  Laterality: Right  Prep: chloraprep       Needles:  Injection technique: Single-shot  Needle Type: Echogenic Needle     Needle Length: 9cm 9 cm Needle Gauge: 21 G    Additional Needles:  Procedures: ultrasound guided (picture in chart) Supraclavicular block Narrative:  Injection made incrementally with aspirations every 5 mL.  Performed by: Personally  Anesthesiologist: Naphtali Zywicki  Additional Notes: Risks, benefits and alternative to block explained extensively.  Patient tolerated procedure well, without complications.

## 2016-01-08 NOTE — Op Note (Signed)
01/07/2016 - 01/08/2016  12:53 PM  PATIENT:  [name]    PRE-OPERATIVE DIAGNOSIS:  right olecranon fracture  POST-OPERATIVE DIAGNOSIS:  Same  PROCEDURE:  OPEN REDUCTION INTERNAL FIXATION (ORIF) ELBOW/OLECRANON FRACTURE, NEUROLYSIS, IRRIGATION AND CLOSURE OF OLECRANON FRACTURE  SURGEON:  MURPHY, Ernesta Amble, MD  ASSISTANT: Roxan Hockey, PA-C, he was present and scrubbed throughout the case, critical for completion in a timely fashion, and for retraction, instrumentation, and closure.   ANESTHESIA:   gen + block  PREOPERATIVE INDICATIONS:  [name] is a  82 y.o. female with a diagnosis of right olecranon fracture who failed conservative measures and elected for surgical management.    The risks benefits and alternatives were discussed with the patient preoperatively including but not limited to the risks of infection, bleeding, nerve injury, cardiopulmonary complications, the need for revision surgery, among others, and the patient was willing to proceed.  OPERATIVE IMPLANTS: Tension band  OPERATIVE FINDINGS: contaminated open fracture  BLOOD LOSS: min  COMPLICATIONS: none  TOURNIQUET TIME: 73min  OPERATIVE PROCEDURE:  Patient was identified in the preoperative holding area and site was marked by me She was transported to the operating theater and placed on the table in supine position taking care to pad all bony prominences. After a preincinduction time out anesthesia was induced. The right upper extremity was prepped and draped in normal sterile fashion and a pre-incision timeout was performed. She received ancef for preoperative antibiotics.   Upper first performed an excisional debridement of her open olecranon fracture of skin muscle and bone I did remove some gross contamination.  I then irrigated the joint and fracture with 3 L of saline.  Next I identified her ulnar nerve I did perform a neural lysis here I freed of adhesions I did not identify any trauma to  the nerve and it was protected throughout the remainder of the case.  Next I reduced the olecranon with good anatomical keys we had an anatomic alignment with no articular step-off. I then placed 2 K wires across the fracture penetrating the anterior cortex of the ulna. I took multiple fluoroscopic x-rays and was happy with the position of the K wires that had an excellent purchase  Next I prepared the tension band by drilling 2 distal holes for a bone bridge of the ulna I passed a 2 #2 FiberWire stitches through these bone tunnels. I then placed him in a tension band fashion around the K wires I then bent and cut each K wire. I don't to them down into the triceps.  Next I tensioned the FiberWire stitches and tied them this way.  I took the elbow through a range of motion the fracture was stable from 0-90 with no gapping.  I then performed a second thorough irrigation as well as debridement of any nonviable skin muscle and bone this was excisional this irrigation was with another 3 L of saline.  I then reapproximated her traumatic wound performing a complex closure of these 10 cm worth of wound and incision.  Sterile dressing was applied she was then placed in a short long-arm splint awoken and taken the PACU in stable condition.   POST OPERATIVE PLAN: Ambulate for DVT prophylaxis splint full-time

## 2016-01-08 NOTE — Anesthesia Postprocedure Evaluation (Signed)
Anesthesia Post Note  Patient: TATHIANA RESNIK  Procedure(s) Performed: Procedure(s) (LRB): OPEN REDUCTION INTERNAL FIXATION (ORIF) ELBOW/OLECRANON FRACTURE, NEUROLYSIS (Right) IRRIGATION AND DEBRIDEMENT OF OLECRANON FRACTURE (Right)  Patient location during evaluation: PACU Anesthesia Type: General and Regional Level of consciousness: awake and alert Pain management: pain level controlled Vital Signs Assessment: post-procedure vital signs reviewed and stable Respiratory status: spontaneous breathing, nonlabored ventilation, respiratory function stable and patient connected to nasal cannula oxygen Cardiovascular status: blood pressure returned to baseline and stable Postop Assessment: no signs of nausea or vomiting Anesthetic complications: no    Last Vitals:  Vitals:   01/08/16 1415 01/08/16 1435  BP: (!) 119/46 (!) 119/39  Pulse: 69 67  Resp: 20 20  Temp: 36.7 C     Last Pain:  Vitals:   01/08/16 1618  TempSrc:   PainSc: Asleep                 Kainat Pizana S

## 2016-01-08 NOTE — ED Notes (Signed)
Report called to Rod Holler, RN @ [facility].

## 2016-01-08 NOTE — Discharge Instructions (Signed)
Non weight bearing right arm Use Sling for comfort. Elevate and apply ice frequently  Take antibiotics as prescribed.  Seek immediate medical attention for hives, swelling, shortness of breath, or other adverse reaction.  Dressing:  Keep ace wrap, bandages, and splint dry.  Diet: As you were doing prior to hospitalization   Shower:   You have a splint on - leave the splint in place and keep the splint dry with a plastic bag.  Activity:  Increase activity slowly as tolerated, but follow the weight bearing instructions below.  The rules on driving is that you can not be taking narcotics while you drive, and you must feel in control of the vehicle.    Weight Bearing:   Non weight bearing right arm   To prevent constipation: you may use a stool softener such as -  Colace (over the counter) 100 mg by mouth twice a day  Drink plenty of fluids (prune juice may be helpful) and high fiber foods Miralax (over the counter) for constipation as needed.    Itching:  If you experience itching with your medications, try taking only a single pain pill, or even half a pain pill at a time.  You may take up to 10 pain pills per day, and you can also use benadryl over the counter for itching or also to help with sleep.   Precautions:  If you experience chest pain or shortness of breath - call 911 immediately for transfer to the hospital emergency department!!  If you develop a fever greater that 101 F, purulent drainage from wound, increased redness or drainage from wound, or calf pain -- Call the office at [phone]                                                 Follow- Up Appointment:  Please call for an appointment to be seen in 2 weeks [place] - [phone]

## 2016-01-08 NOTE — Consult Note (Signed)
ORTHOPAEDIC CONSULTATION  REQUESTING PHYSICIAN: Geradine Girt, DO  Chief Complaint: Right elbow pain s/p fall   Assessment: Principal Problem:   Olecranon fracture Active Problems:   Hypothyroidism   Essential hypertension   Chronic kidney disease (CKD), stage III (moderate)   Osteoporosis   ANEMIA-NOS   Anxiety and depression   Hx of cataract surgery  Olecranon fracture requiring ORIF with laceration needing washout and complex closure.  Plan: Nothing by mouth ORIF and washout right elbow today. Weight Bearing Status: NWB right arm.   Sling for comfort VTE px: SCD's prior to OR  The risks benefits and alternatives of the procedure were discussed with the patient including but not limited to infection, bleeding, nerve injury, the need for revision surgery, blood clots, cardiopulmonary complications, morbidity, mortality, among others.  The patient verbalizes understanding and wishes to proceed.     HPI: [name] is a 82 y.o. female with a history of HTN, anxiety, hypothyroid, and recent cataract surgery who complains of right elbow pain after fall occurred last night from 2 cement steps outside of her house-she landed on her right side.  She currently denies dizziness, shortness of breath, chest pain, orthopnea, PND.  She has some chronic stable shortness of breath with exertion and occasional lower extremity swelling.  Past Medical History:  Diagnosis Date  . Adrenal myelolipoma   . DIVERTICULOSIS, COLON   . Dysmetabolic syndrome X   . FIBROMYALGIA   . Gastric ulcer due to Helicobacter pylori   . Hiatal hernia   . HYPERLIPIDEMIA   . Hyperplastic colon polyp   . HYPERTENSION, ESSENTIAL NOS   . HYPERTROPHIC CARDIOMYOPATHY   . Hyponatremia   . Hypothyroidism   . MYCOSIS FUNGOIDES   . Nodule of left lung   . Normocytic anemia   . OSTEOARTHRITIS   . OSTEOPOROSIS   . Other abnormal glucose   . Renal angiomyolipoma   . RENAL INSUFFICIENCY   .  Unspecified vitamin D deficiency    Past Surgical History:  Procedure Laterality Date  . colonoscopy with polypectomy  07/25/2007   Dr Olevia Perches, Recheck in 7 years for 2009  . G 4 P 2     Social History   Social History  . Marital status: Married    Spouse name: N/A  . Number of children: 2  . Years of education: N/A   Occupational History  . Retired Retired   Social History Main Topics  . Smoking status: Former Smoker    Quit date: 06/05/1963  . Smokeless tobacco: Never Used     Comment: smoked 1951-1965 , up to 1/2 ppd  . Alcohol use No  . Drug use: No  . Sexual activity: Not Asked   Other Topics Concern  . None   Social History Narrative   Widowed.  Lives alone.  Ambulates without assistance.  Steady on feet.   Family History  Problem Relation Age of Onset  . Diabetes Mother   . Heart attack Maternal Uncle 59    his 2 sons had MI in 33s  . Hypertension Maternal Grandmother   . Stroke Maternal Grandmother 69  . Cancer Neg Hx   . Asthma Neg Hx   . COPD Neg Hx    Allergies  Allergen Reactions  . Achromycin [Tetracycline Hcl] Swelling and Other (See Comments)    Reaction:  Facial swelling   . Penicillins Anaphylaxis and Other (See Comments)    Has patient had a PCN reaction causing  immediate rash, facial/tongue/throat swelling, SOB or lightheadedness with hypotension: Yes Has patient had a PCN reaction causing severe rash involving mucus membranes or skin necrosis: No Has patient had a PCN reaction that required hospitalization No Has patient had a PCN reaction occurring within the last 10 years: No If all of the above answers are "NO", then may proceed with Cephalosporin use.  . Simvastatin Other (See Comments)    Reaction:  Muscle pain   . Celecoxib Diarrhea  . Clarithromycin Other (See Comments)    Reaction:  Unknown   . Ezetimibe-Simvastatin Diarrhea, Nausea And Vomiting and Other (See Comments)    Reaction:  Muscle pain   . Flagyl [Metronidazole] Other  (See Comments)    Reaction:  Unknown   . Abilify [Aripiprazole] Other (See Comments)    Reaction:  Insomnia    Prior to Admission medications   Medication Sig Start Date End Date Taking? Authorizing Provider  acetaminophen (TYLENOL) 650 MG CR tablet Take 650 mg by mouth 2 (two) times daily.   Yes Historical Provider, MD  aspirin 81 MG chewable tablet Chew 81 mg by mouth daily.    Yes Historical Provider, MD  beta carotene w/minerals (OCUVITE) tablet Take 1 tablet by mouth daily.   Yes Historical Provider, MD  cholecalciferol (VITAMIN D) 1000 UNITS tablet Take 2,000 Units by mouth daily.    Yes Historical Provider, MD  levothyroxine (SYNTHROID, LEVOTHROID) 75 MCG tablet Take 37.5-75 mcg by mouth daily before breakfast. Pt takes one-half tablet on Tuesday and Saturday.   Pt takes one whole tablet on Sunday, Monday, Wednesday, Thursday, and Friday.   Yes Historical Provider, MD  lisinopril (PRINIVIL,ZESTRIL) 20 MG tablet Take 20 mg by mouth daily.   Yes Historical Provider, MD  nepafenac (ILEVRO) 0.3 % ophthalmic suspension Place 1 drop into the left eye daily.   Yes Historical Provider, MD  ofloxacin (OCUFLOX) 0.3 % ophthalmic solution Place 1 drop into the left eye 4 (four) times daily.   Yes Historical Provider, MD  omeprazole (PRILOSEC) 20 MG capsule Take 20 mg by mouth daily.   Yes Historical Provider, MD  prednisoLONE acetate (PRED FORTE) 1 % ophthalmic suspension Place 1 drop into the left eye 4 (four) times daily.   Yes Historical Provider, MD   Dg Elbow Complete Right  Result Date: 01/07/2016 CLINICAL DATA:  Fall off porch tonight landing on right side with right elbow pain. Laceration. EXAM: RIGHT ELBOW - COMPLETE 3+ VIEW COMPARISON:  None. FINDINGS: Significantly displaced olecranon fracture with of 1.8 cm osseous distraction at the articular surface and proximal migration of the olecranon process. Proximal radius and distal humerus are intact. Soft tissue injury with laceration/air about  the proximal ulna, proximal to the fracture site. IMPRESSION: Significantly displaced intra-articular olecranon fracture. Soft tissue injury with laceration/air adjacent to the proximal ulna, proximal to the fracture site. Electronically Signed   By: Jeb Levering M.D.   On: 01/07/2016 20:58   Dg Tibia/fibula Right  Result Date: 01/07/2016 CLINICAL DATA:  Fall off porch landing on right side with right calf swelling EXAM: RIGHT TIBIA AND FIBULA - 2 VIEW COMPARISON:  None. FINDINGS: There is no evidence of fracture or other focal bone lesions. Knee and ankle alignment is maintained. There is diffuse soft tissue edema of the lower leg. IMPRESSION: Soft tissue edema.  No fracture of the right lower leg. Electronically Signed   By: Jeb Levering M.D.   On: 01/07/2016 20:59   Chest Portable 1 View  Result Date:  01/08/2016 CLINICAL DATA:  Elbow fracture. EXAM: PORTABLE CHEST 1 VIEW COMPARISON:  None. FINDINGS: The heart size and mediastinal contours are within normal limits. Both lungs are clear. The visualized skeletal structures are unremarkable. IMPRESSION: No active disease. Electronically Signed   By: Dorise Bullion III M.D   On: 01/08/2016 08:08    Positive ROS: All other systems have been reviewed and were otherwise negative with the exception of those mentioned in the HPI and as above.  Objective: Labs cbc  Recent Labs  01/07/16 2236  WBC 11.3*  HGB 11.1*  HCT 33.2*  PLT 218    Recent Labs  01/07/16 2236  NA 138  K 4.1  CL 109  CO2 23  GLUCOSE 124*  BUN 25*  CREATININE 0.99  CALCIUM 9.2    Physical Exam: Vitals:   01/08/16 0500 01/08/16 0621  BP:  (!) 102/31  Pulse:  64  Resp: 18 16  Temp:  97.9 F (36.6 C)   General: Alert, no acute distress Mental status: Alert and Oriented x3 Neurologic: Speech Clear and organized, no gross focal findings or movement disorder appreciated. Respiratory: No cyanosis, no use of accessory musculature Cardiovascular: No pedal  edema,  RRR, 2/6 Systolic murmur RUSB. GI: Abdomen is protuberant, soft and non-tender, non-distended. Skin: Warm and dry.   Extremities: Warm and well perfused w/o edema Psychiatric: Patient is competent for consent with normal mood and affect  MUSCULOSKELETAL:  Right arm in splint.  Fingers warm, good cap refill.  Moves all fingers.  NVI.  Sensation intact distally.   Right shin with atraumatic dressing in place. Other extremities are atraumatic with painless ROM and NVI.   Prudencio Burly III PA-C 01/08/2016 9:36 AM

## 2016-01-08 NOTE — H&P (View-Only) (Signed)
ORTHOPAEDIC CONSULTATION  REQUESTING PHYSICIAN: Geradine Girt, DO  Chief Complaint: Right elbow pain s/p fall   Assessment: Principal Problem:   Olecranon fracture Active Problems:   Hypothyroidism   Essential hypertension   Chronic kidney disease (CKD), stage III (moderate)   Osteoporosis   ANEMIA-NOS   Anxiety and depression   Hx of cataract surgery  Olecranon fracture requiring ORIF with laceration needing washout and complex closure.  Plan: Nothing by mouth ORIF and washout right elbow today. Weight Bearing Status: NWB right arm.   Sling for comfort VTE px: SCD's prior to OR  The risks benefits and alternatives of the procedure were discussed with the patient including but not limited to infection, bleeding, nerve injury, the need for revision surgery, blood clots, cardiopulmonary complications, morbidity, mortality, among others.  The patient verbalizes understanding and wishes to proceed.     HPI: [name] is a 82 y.o. female with a history of HTN, anxiety, hypothyroid, and recent cataract surgery who complains of right elbow pain after fall occurred last night from 2 cement steps outside of her house-she landed on her right side.  She currently denies dizziness, shortness of breath, chest pain, orthopnea, PND.  She has some chronic stable shortness of breath with exertion and occasional lower extremity swelling.  Past Medical History:  Diagnosis Date  . Adrenal myelolipoma   . DIVERTICULOSIS, COLON   . Dysmetabolic syndrome X   . FIBROMYALGIA   . Gastric ulcer due to Helicobacter pylori   . Hiatal hernia   . HYPERLIPIDEMIA   . Hyperplastic colon polyp   . HYPERTENSION, ESSENTIAL NOS   . HYPERTROPHIC CARDIOMYOPATHY   . Hyponatremia   . Hypothyroidism   . MYCOSIS FUNGOIDES   . Nodule of left lung   . Normocytic anemia   . OSTEOARTHRITIS   . OSTEOPOROSIS   . Other abnormal glucose   . Renal angiomyolipoma   . RENAL INSUFFICIENCY   .  Unspecified vitamin D deficiency    Past Surgical History:  Procedure Laterality Date  . colonoscopy with polypectomy  07/25/2007   Dr Olevia Perches, Recheck in 7 years for 2009  . G 4 P 2     Social History   Social History  . Marital status: Married    Spouse name: N/A  . Number of children: 2  . Years of education: N/A   Occupational History  . Retired Retired   Social History Main Topics  . Smoking status: Former Smoker    Quit date: 06/05/1963  . Smokeless tobacco: Never Used     Comment: smoked 1951-1965 , up to 1/2 ppd  . Alcohol use No  . Drug use: No  . Sexual activity: Not Asked   Other Topics Concern  . None   Social History Narrative   Widowed.  Lives alone.  Ambulates without assistance.  Steady on feet.   Family History  Problem Relation Age of Onset  . Diabetes Mother   . Heart attack Maternal Uncle 16    his 2 sons had MI in 37s  . Hypertension Maternal Grandmother   . Stroke Maternal Grandmother 69  . Cancer Neg Hx   . Asthma Neg Hx   . COPD Neg Hx    Allergies  Allergen Reactions  . Achromycin [Tetracycline Hcl] Swelling and Other (See Comments)    Reaction:  Facial swelling   . Penicillins Anaphylaxis and Other (See Comments)    Has patient had a PCN reaction causing  immediate rash, facial/tongue/throat swelling, SOB or lightheadedness with hypotension: Yes Has patient had a PCN reaction causing severe rash involving mucus membranes or skin necrosis: No Has patient had a PCN reaction that required hospitalization No Has patient had a PCN reaction occurring within the last 10 years: No If all of the above answers are "NO", then may proceed with Cephalosporin use.  . Simvastatin Other (See Comments)    Reaction:  Muscle pain   . Celecoxib Diarrhea  . Clarithromycin Other (See Comments)    Reaction:  Unknown   . Ezetimibe-Simvastatin Diarrhea, Nausea And Vomiting and Other (See Comments)    Reaction:  Muscle pain   . Flagyl [Metronidazole] Other  (See Comments)    Reaction:  Unknown   . Abilify [Aripiprazole] Other (See Comments)    Reaction:  Insomnia    Prior to Admission medications   Medication Sig Start Date End Date Taking? Authorizing Provider  acetaminophen (TYLENOL) 650 MG CR tablet Take 650 mg by mouth 2 (two) times daily.   Yes Historical Provider, MD  aspirin 81 MG chewable tablet Chew 81 mg by mouth daily.    Yes Historical Provider, MD  beta carotene w/minerals (OCUVITE) tablet Take 1 tablet by mouth daily.   Yes Historical Provider, MD  cholecalciferol (VITAMIN D) 1000 UNITS tablet Take 2,000 Units by mouth daily.    Yes Historical Provider, MD  levothyroxine (SYNTHROID, LEVOTHROID) 75 MCG tablet Take 37.5-75 mcg by mouth daily before breakfast. Pt takes one-half tablet on Tuesday and Saturday.   Pt takes one whole tablet on Sunday, Monday, Wednesday, Thursday, and Friday.   Yes Historical Provider, MD  lisinopril (PRINIVIL,ZESTRIL) 20 MG tablet Take 20 mg by mouth daily.   Yes Historical Provider, MD  nepafenac (ILEVRO) 0.3 % ophthalmic suspension Place 1 drop into the left eye daily.   Yes Historical Provider, MD  ofloxacin (OCUFLOX) 0.3 % ophthalmic solution Place 1 drop into the left eye 4 (four) times daily.   Yes Historical Provider, MD  omeprazole (PRILOSEC) 20 MG capsule Take 20 mg by mouth daily.   Yes Historical Provider, MD  prednisoLONE acetate (PRED FORTE) 1 % ophthalmic suspension Place 1 drop into the left eye 4 (four) times daily.   Yes Historical Provider, MD   Dg Elbow Complete Right  Result Date: 01/07/2016 CLINICAL DATA:  Fall off porch tonight landing on right side with right elbow pain. Laceration. EXAM: RIGHT ELBOW - COMPLETE 3+ VIEW COMPARISON:  None. FINDINGS: Significantly displaced olecranon fracture with of 1.8 cm osseous distraction at the articular surface and proximal migration of the olecranon process. Proximal radius and distal humerus are intact. Soft tissue injury with laceration/air about  the proximal ulna, proximal to the fracture site. IMPRESSION: Significantly displaced intra-articular olecranon fracture. Soft tissue injury with laceration/air adjacent to the proximal ulna, proximal to the fracture site. Electronically Signed   By: Jeb Levering M.D.   On: 01/07/2016 20:58   Dg Tibia/fibula Right  Result Date: 01/07/2016 CLINICAL DATA:  Fall off porch landing on right side with right calf swelling EXAM: RIGHT TIBIA AND FIBULA - 2 VIEW COMPARISON:  None. FINDINGS: There is no evidence of fracture or other focal bone lesions. Knee and ankle alignment is maintained. There is diffuse soft tissue edema of the lower leg. IMPRESSION: Soft tissue edema.  No fracture of the right lower leg. Electronically Signed   By: Jeb Levering M.D.   On: 01/07/2016 20:59   Chest Portable 1 View  Result Date:  01/08/2016 CLINICAL DATA:  Elbow fracture. EXAM: PORTABLE CHEST 1 VIEW COMPARISON:  None. FINDINGS: The heart size and mediastinal contours are within normal limits. Both lungs are clear. The visualized skeletal structures are unremarkable. IMPRESSION: No active disease. Electronically Signed   By: Dorise Bullion III M.D   On: 01/08/2016 08:08    Positive ROS: All other systems have been reviewed and were otherwise negative with the exception of those mentioned in the HPI and as above.  Objective: Labs cbc  Recent Labs  01/07/16 2236  WBC 11.3*  HGB 11.1*  HCT 33.2*  PLT 218    Recent Labs  01/07/16 2236  NA 138  K 4.1  CL 109  CO2 23  GLUCOSE 124*  BUN 25*  CREATININE 0.99  CALCIUM 9.2    Physical Exam: Vitals:   01/08/16 0500 01/08/16 0621  BP:  (!) 102/31  Pulse:  64  Resp: 18 16  Temp:  97.9 F (36.6 C)   General: Alert, no acute distress Mental status: Alert and Oriented x3 Neurologic: Speech Clear and organized, no gross focal findings or movement disorder appreciated. Respiratory: No cyanosis, no use of accessory musculature Cardiovascular: No pedal  edema,  RRR, 2/6 Systolic murmur RUSB. GI: Abdomen is protuberant, soft and non-tender, non-distended. Skin: Warm and dry.   Extremities: Warm and well perfused w/o edema Psychiatric: Patient is competent for consent with normal mood and affect  MUSCULOSKELETAL:  Right arm in splint.  Fingers warm, good cap refill.  Moves all fingers.  NVI.  Sensation intact distally.   Right shin with atraumatic dressing in place. Other extremities are atraumatic with painless ROM and NVI.   Prudencio Burly III PA-C 01/08/2016 9:36 AM

## 2016-01-08 NOTE — Anesthesia Procedure Notes (Signed)
Procedure Name: Intubation Date/Time: 01/08/2016 11:57 AM Performed by: Myna Bright Pre-anesthesia Checklist: Patient identified, Emergency Drugs available, Suction available and Patient being monitored Patient Re-evaluated:Patient Re-evaluated prior to inductionOxygen Delivery Method: Circle system utilized Preoxygenation: Pre-oxygenation with 100% oxygen Intubation Type: IV induction Ventilation: Mask ventilation without difficulty Laryngoscope Size: Mac and 3 Grade View: Grade I Tube type: Oral Tube size: 7.0 mm Number of attempts: 1 Airway Equipment and Method: Stylet Placement Confirmation: ETT inserted through vocal cords under direct vision,  positive ETCO2 and breath sounds checked- equal and bilateral Secured at: 21 cm Tube secured with: Tape Dental Injury: Teeth and Oropharynx as per pre-operative assessment

## 2016-01-08 NOTE — Progress Notes (Signed)
PROGRESS NOTE    [name]  [id number] DOB: 1928/02/07 DOA: 01/07/2016 PCP: Binnie Rail, MD   Outpatient Specialists:     Brief Narrative:  [name] is a 82 y.o. female who lives alone with medical history significant of HTN, hypothyroidism, anxiety, and recent cataract surgery (last Wednesday) presenting following a fall.  Found to have olecranon fracture- open.  Last tetanus was 5 years ago per family.  Taken to OR on 8/6.   Assessment & Plan:   Principal Problem:   Olecranon fracture Active Problems:   Hypothyroidism   Essential hypertension   Chronic kidney disease (CKD), stage III (moderate)   Osteoporosis   ANEMIA-NOS   Anxiety and depression   Hx of cataract surgery   Systolic murmur   Olecranon fracture, significantly displaced, intra-articular -Dr. Percell Miller consult -Pain control with tylenol, norco, morphine as needed -Ancef coverage until OR, pharmacy to dose  HTN -Elevated on presentation, likely pain-related -Continue Lisinopril -Will follow  Hypothyroidism -TSH normal in 3/17 -Continue Synthroid at current dose  CKD -Creatinine actually better than usual baseline of 1.1 -Still with GFR <60  Osteoporosis -Only taking vitamin D as an outpatient -Consider addition of calcium and possibly bisphosphonate therapy (uncertain if she has taken in the past)- defer to PCP  Anemia -Hgb stable despite fall and blood loss with laceration -Normocytic, possibly related to renal disease -Will follow  Anxiety -Takes Xanax prn -Will continue  Cataract surgery -Just last Wednesday -Still using drops, will continue -For right eye surgery on 8/16, will have to see if this needs delay based on clinical course   DVT prophylaxis:  SCD's  Code Status: Full Code   Family Communication: Daughter at bedside  Disposition Plan:     Consultants:   ortho  Procedures:  OPEN REDUCTION INTERNAL FIXATION (ORIF) ELBOW/OLECRANON  FRACTURE, NEUROLYSIS, IRRIGATION AND CLOSURE OF OLECRANON FRACTURE     Subjective: No arm pain as long as she is still No CP, no SOB  Objective: Vitals:   01/08/16 0044 01/08/16 0154 01/08/16 0500 01/08/16 0621  BP: 142/58 (!) 147/41  (!) 102/31  Pulse: 76 72  64  Resp: 18 16 18 16   Temp: 98.2 F (36.8 C) 98.3 F (36.8 C)  97.9 F (36.6 C)  TempSrc: Oral Oral  Oral  SpO2: 96% 98%  98%  Weight:  70 kg (154 lb 5.2 oz)    Height:  5' (1.524 m)      Intake/Output Summary (Last 24 hours) at 01/08/16 1335 Last data filed at 01/08/16 1329  Gross per 24 hour  Intake           934.17 ml  Output               50 ml  Net           884.17 ml   Filed Weights   01/07/16 1959 01/08/16 0154  Weight: 70.3 kg (155 lb) 70 kg (154 lb 5.2 oz)    Examination:  General exam: Appears calm and comfortable  Respiratory system: Clear to auscultation. Respiratory effort normal. Cardiovascular system: S1 & S2 heard, RRR. No JVD, murmurs, rubs, gallops or clicks. No pedal edema. Gastrointestinal system: Abdomen is nondistended, soft and nontender. No organomegaly or masses felt. Normal bowel sounds heard. Central nervous system: Alert and oriented. No focal neurological deficits. Extremities: right arm in sling and wrapped with ace bandage    Data Reviewed: I have personally reviewed following labs and imaging studies  CBC:  Recent Labs Lab 01/07/16 2236  WBC 11.3*  NEUTROABS 9.3*  HGB 11.1*  HCT 33.2*  MCV 86.7  PLT 99991111   Basic Metabolic Panel:  Recent Labs Lab 01/07/16 2236  NA 138  K 4.1  CL 109  CO2 23  GLUCOSE 124*  BUN 25*  CREATININE 0.99  CALCIUM 9.2   GFR: Estimated Creatinine Clearance: 36.3 mL/min (by C-G formula based on SCr of 0.99 mg/dL). Liver Function Tests: No results for input(s): AST, ALT, ALKPHOS, BILITOT, PROT, ALBUMIN in the last 168 hours. No results for input(s): LIPASE, AMYLASE in the last 168 hours. No results for input(s): AMMONIA in the  last 168 hours. Coagulation Profile: No results for input(s): INR, PROTIME in the last 168 hours. Cardiac Enzymes: No results for input(s): CKTOTAL, CKMB, CKMBINDEX, TROPONINI in the last 168 hours. BNP (last 3 results) No results for input(s): PROBNP in the last 8760 hours. HbA1C: No results for input(s): HGBA1C in the last 72 hours. CBG: No results for input(s): GLUCAP in the last 168 hours. Lipid Profile: No results for input(s): CHOL, HDL, LDLCALC, TRIG, CHOLHDL, LDLDIRECT in the last 72 hours. Thyroid Function Tests: No results for input(s): TSH, T4TOTAL, FREET4, T3FREE, THYROIDAB in the last 72 hours. Anemia Panel: No results for input(s): VITAMINB12, FOLATE, FERRITIN, TIBC, IRON, RETICCTPCT in the last 72 hours. Urine analysis:    Component Value Date/Time   COLORURINE YELLOW 11/08/2013 2123   APPEARANCEUR CLEAR 11/08/2013 2123   LABSPEC 1.008 11/08/2013 2123   PHURINE 6.5 11/08/2013 2123   GLUCOSEU NEGATIVE 11/08/2013 2123   HGBUR NEGATIVE 11/08/2013 2123   BILIRUBINUR NEGATIVE 11/08/2013 2123   KETONESUR NEGATIVE 11/08/2013 2123   PROTEINUR NEGATIVE 11/08/2013 2123   UROBILINOGEN 0.2 11/08/2013 2123   NITRITE NEGATIVE 11/08/2013 2123   LEUKOCYTESUR TRACE (A) 11/08/2013 2123     ) Recent Results (from the past 240 hour(s))  Surgical PCR screen     Status: None   Collection Time: 01/08/16  2:48 AM  Result Value Ref Range Status   MRSA, PCR NEGATIVE NEGATIVE Final   Staphylococcus aureus NEGATIVE NEGATIVE Final    Comment:        The Xpert SA Assay (FDA approved for NASAL specimens in patients over 96 years of age), is one component of a comprehensive surveillance program.  Test performance has been validated by [facility] for patients greater than or equal to 25 year old. It is not intended to diagnose infection nor to guide or monitor treatment.       Anti-infectives    Start     Dose/Rate Route Frequency Ordered Stop   01/08/16 1141  vancomycin  (VANCOCIN) 1-5 GM/200ML-% IVPB    Comments:  Decarlo, Valerie   : cabinet override      01/08/16 1141 01/08/16 2344   01/08/16 1136  ceFAZolin (ANCEF) 2-4 GM/100ML-% IVPB    Comments:  Precious Haws   : cabinet override      01/08/16 1136 01/08/16 2344   01/08/16 1000  [MAR Hold]  ceFAZolin (ANCEF) IVPB 1 g/50 mL premix     (MAR Hold since 01/08/16 1106)   1 g 100 mL/hr over 30 Minutes Intravenous Every 12 hours 01/08/16 0210     01/07/16 2200  ceFAZolin (ANCEF) IVPB 1 g/50 mL premix     1 g 100 mL/hr over 30 Minutes Intravenous  Once 01/07/16 2152 01/08/16 0021       Radiology Studies: Dg Elbow Complete Right  Result Date: 01/07/2016  CLINICAL DATA:  Fall off porch tonight landing on right side with right elbow pain. Laceration. EXAM: RIGHT ELBOW - COMPLETE 3+ VIEW COMPARISON:  None. FINDINGS: Significantly displaced olecranon fracture with of 1.8 cm osseous distraction at the articular surface and proximal migration of the olecranon process. Proximal radius and distal humerus are intact. Soft tissue injury with laceration/air about the proximal ulna, proximal to the fracture site. IMPRESSION: Significantly displaced intra-articular olecranon fracture. Soft tissue injury with laceration/air adjacent to the proximal ulna, proximal to the fracture site. Electronically Signed   By: Jeb Levering M.D.   On: 01/07/2016 20:58   Dg Tibia/fibula Right  Result Date: 01/07/2016 CLINICAL DATA:  Fall off porch landing on right side with right calf swelling EXAM: RIGHT TIBIA AND FIBULA - 2 VIEW COMPARISON:  None. FINDINGS: There is no evidence of fracture or other focal bone lesions. Knee and ankle alignment is maintained. There is diffuse soft tissue edema of the lower leg. IMPRESSION: Soft tissue edema.  No fracture of the right lower leg. Electronically Signed   By: Jeb Levering M.D.   On: 01/07/2016 20:59   Chest Portable 1 View  Result Date: 01/08/2016 CLINICAL DATA:  Elbow fracture. EXAM:  PORTABLE CHEST 1 VIEW COMPARISON:  None. FINDINGS: The heart size and mediastinal contours are within normal limits. Both lungs are clear. The visualized skeletal structures are unremarkable. IMPRESSION: No active disease. Electronically Signed   By: Dorise Bullion III M.D   On: 01/08/2016 08:08        Scheduled Meds: . [MAR Hold] aspirin  81 mg Oral Daily  . ceFAZolin      . [MAR Hold]  ceFAZolin (ANCEF) IV  1 g Intravenous Q12H  . [MAR Hold] levothyroxine  37.5 mcg Oral Once per day on Tue Sat  . [MAR Hold] levothyroxine  75 mcg Oral Once per day on Sun Mon Wed Thu Fri  . [MAR Hold] lisinopril  20 mg Oral Daily  . midazolam      . [MAR Hold] multivitamin  1 tablet Oral Daily  . [MAR Hold] nepafenac  1 drop Left Eye Daily  . [MAR Hold] ofloxacin  1 drop Left Eye QID  . [MAR Hold] pantoprazole  40 mg Oral Daily  . [MAR Hold] prednisoLONE acetate  1 drop Left Eye QID  . vancomycin       Continuous Infusions: . dextrose 5 % and 0.45% NaCl 100 mL/hr (01/08/16 1028)  . lactated ringers 50 mL/hr at 01/08/16 0500  . lactated ringers 50 mL/hr at 01/08/16 1129     LOS: 1 day    Time spent: 25 min    [place], DO [facility] Pager [phone]  If 7PM-7AM, please contact night-coverage [email or web address] Password [email or web address] 01/08/2016, 1:35 PM

## 2016-01-08 NOTE — Progress Notes (Signed)
Pharmacy Antibiotic Note  [name] is a 82 y.o. female admitted on 01/07/2016 with wound infection.  Pharmacy has been consulted for Ancef dosing.  Plan: Ancef 1Gm IV q12h  Height: 4\' 9"  (144.8 cm) Weight: 155 lb (70.3 kg) IBW/kg (Calculated) : 38.6  Temp (24hrs), Avg:97.9 F (36.6 C), Min:97.5 F (36.4 C), Max:98.2 F (36.8 C)   Recent Labs Lab 01/07/16 2236  WBC 11.3*  CREATININE 0.99    Estimated Creatinine Clearance: 33.6 mL/min (by C-G formula based on SCr of 0.99 mg/dL).    Allergies  Allergen Reactions  . Achromycin [Tetracycline Hcl] Swelling and Other (See Comments)    Reaction:  Facial swelling   . Omnipen [Ampicillin] Anaphylaxis and Other (See Comments)    Has patient had a PCN reaction causing immediate rash, facial/tongue/throat swelling, SOB or lightheadedness with hypotension: Yes Has patient had a PCN reaction causing severe rash involving mucus membranes or skin necrosis: No Has patient had a PCN reaction that required hospitalization No Has patient had a PCN reaction occurring within the last 10 years: No If all of the above answers are "NO", then may proceed with Cephalosporin use.  Marland Kitchen Penicillins Anaphylaxis and Other (See Comments)    Has patient had a PCN reaction causing immediate rash, facial/tongue/throat swelling, SOB or lightheadedness with hypotension: Yes Has patient had a PCN reaction causing severe rash involving mucus membranes or skin necrosis: No Has patient had a PCN reaction that required hospitalization No Has patient had a PCN reaction occurring within the last 10 years: No If all of the above answers are "NO", then may proceed with Cephalosporin use.  . Simvastatin Other (See Comments)    Reaction:  Muscle pain   . Celecoxib Diarrhea  . Clarithromycin Other (See Comments)    Reaction:  Unknown   . Ezetimibe-Simvastatin Diarrhea, Nausea And Vomiting and Other (See Comments)    Reaction:  Muscle pain   . Flagyl [Metronidazole]  Other (See Comments)    Reaction:  Unknown   . Abilify [Aripiprazole] Other (See Comments)    Reaction:  Insomnia     Antimicrobials this admission: 8/6 ancef >>   Dose adjustments this admission:   Microbiology results:  BCx:   UCx:    Sputum:    MRSA PCR:   Thank you for allowing pharmacy to be a part of this patient's care.  Dorrene German 01/08/2016 2:12 AM

## 2016-01-08 NOTE — Anesthesia Preprocedure Evaluation (Addendum)
Anesthesia Evaluation  Patient identified by MRN, date of birth, ID band Patient awake    Reviewed: Allergy & Precautions, NPO status , Patient's Chart, lab work & pertinent test results  Airway Mallampati: II  TM Distance: >3 FB Neck ROM: Full    Dental no notable dental hx.    Pulmonary neg pulmonary ROS, former smoker,    Pulmonary exam normal breath sounds clear to auscultation       Cardiovascular Normal cardiovascular exam Rhythm:Regular Rate:Normal     Neuro/Psych negative neurological ROS  negative psych ROS   GI/Hepatic Neg liver ROS, hiatal hernia,   Endo/Other  Hypothyroidism   Renal/GU Renal InsufficiencyRenal disease  negative genitourinary   Musculoskeletal negative musculoskeletal ROS (+)   Abdominal   Peds negative pediatric ROS (+)  Hematology negative hematology ROS (+)   Anesthesia Other Findings   Reproductive/Obstetrics negative OB ROS                             Anesthesia Physical Anesthesia Plan  ASA: III  Anesthesia Plan: General   Post-op Pain Management: GA combined w/ Regional for post-op pain   Induction: Intravenous  Airway Management Planned: Oral ETT  Additional Equipment:   Intra-op Plan:   Post-operative Plan: Extubation in OR  Informed Consent: I have reviewed the patients History and Physical, chart, labs and discussed the procedure including the risks, benefits and alternatives for the proposed anesthesia with the patient or authorized representative who has indicated his/her understanding and acceptance.   Dental advisory given  Plan Discussed with: CRNA and Surgeon  Anesthesia Plan Comments:         Anesthesia Quick Evaluation

## 2016-01-08 NOTE — Transfer of Care (Signed)
Immediate Anesthesia Transfer of Care Note  Patient: [name]  Procedure(s) Performed: Procedure(s): OPEN REDUCTION INTERNAL FIXATION (ORIF) ELBOW/OLECRANON FRACTURE, NEUROLYSIS (Right) IRRIGATION AND DEBRIDEMENT OF OLECRANON FRACTURE (Right)  Patient Location: PACU  Anesthesia Type:General  Level of Consciousness: awake, alert , oriented and patient cooperative  Airway & Oxygen Therapy: Patient Spontanous Breathing and Patient connected to nasal cannula oxygen  Post-op Assessment: Report given to RN, Post -op Vital signs reviewed and stable and Patient moving all extremities  Post vital signs: Reviewed and stable  Last Vitals:  Vitals:   01/08/16 0500 01/08/16 0621  BP:  (!) 102/31  Pulse:  64  Resp: 18 16  Temp:  36.6 C    Last Pain:  Vitals:   01/08/16 0653  TempSrc:   PainSc: 3          Complications: No apparent anesthesia complications

## 2016-01-08 NOTE — ED Notes (Signed)
[facility] called for transportation 

## 2016-01-08 NOTE — Progress Notes (Signed)
Pharmacy Antibiotic Note  [name] is a 82 y.o. female admitted on 01/07/2016 with wound infection.  Pharmacy has been consulted for Ancef dosing. Her renal function is normal. Anticipate that she will not need further dose adjustment so rx will sign off.   Plan: Ancef 1Gm IV q12h Rx sign off  Height: 5' (152.4 cm) Weight: 154 lb 5.2 oz (70 kg) IBW/kg (Calculated) : 45.5  Temp (24hrs), Avg:98 F (36.7 C), Min:97.5 F (36.4 C), Max:98.3 F (36.8 C)   Recent Labs Lab 01/07/16 2236  WBC 11.3*  CREATININE 0.99    Estimated Creatinine Clearance: 36.3 mL/min (by C-G formula based on SCr of 0.99 mg/dL).    Allergies  Allergen Reactions  . Achromycin [Tetracycline Hcl] Swelling and Other (See Comments)    Reaction:  Facial swelling   . Penicillins Anaphylaxis and Other (See Comments)    Has patient had a PCN reaction causing immediate rash, facial/tongue/throat swelling, SOB or lightheadedness with hypotension: Yes Has patient had a PCN reaction causing severe rash involving mucus membranes or skin necrosis: No Has patient had a PCN reaction that required hospitalization No Has patient had a PCN reaction occurring within the last 10 years: No If all of the above answers are "NO", then may proceed with Cephalosporin use.  . Simvastatin Other (See Comments)    Reaction:  Muscle pain   . Celecoxib Diarrhea  . Clarithromycin Other (See Comments)    Reaction:  Unknown   . Ezetimibe-Simvastatin Diarrhea, Nausea And Vomiting and Other (See Comments)    Reaction:  Muscle pain   . Flagyl [Metronidazole] Other (See Comments)    Reaction:  Unknown   . Abilify [Aripiprazole] Other (See Comments)    Reaction:  Insomnia     Antimicrobials this admission: 8/6 ancef >>   Dose adjustments this admission:   Microbiology results:  BCx:   UCx:    Sputum:    MRSA PCR: neg  Onnie Boer, PharmD Pager: [phone] 01/08/2016 7:50 AM

## 2016-01-09 ENCOUNTER — Encounter (HOSPITAL_COMMUNITY): Payer: Self-pay | Admitting: Orthopedic Surgery

## 2016-01-09 DIAGNOSIS — N183 Chronic kidney disease, stage 3 (moderate): Secondary | ICD-10-CM

## 2016-01-09 DIAGNOSIS — S52021F Displaced fracture of olecranon process without intraarticular extension of right ulna, subsequent encounter for open fracture type IIIA, IIIB, or IIIC with routine healing: Secondary | ICD-10-CM

## 2016-01-09 DIAGNOSIS — I10 Essential (primary) hypertension: Secondary | ICD-10-CM

## 2016-01-09 MED FILL — Ondansetron HCl Inj 4 MG/2ML (2 MG/ML): INTRAMUSCULAR | Qty: 2 | Status: AC

## 2016-01-09 MED FILL — Morphine Sulfate Inj 10 MG/ML: INTRAMUSCULAR | Qty: 1 | Status: AC

## 2016-01-09 NOTE — Progress Notes (Signed)
Review discharge papers and medications with [name] and her daughter with full understanding

## 2016-01-09 NOTE — Care Management Important Message (Signed)
Important Message  Patient Details  Name: [name] MRN: [name] Date of Birth: January 22, 1928   Medicare Important Message Given:  Yes    Loann Quill 01/09/2016, 9:21 AM

## 2016-01-09 NOTE — Discharge Summary (Signed)
Physician Discharge Summary  [name] [id number] DOB: 11-19-1927 DOA: 01/07/2016  PCP: Binnie Rail, MD  Admit date: 01/07/2016 Discharge date: 01/09/2016  Admitted From: Home.  Disposition:  HOme.   Recommendations for Outpatient Follow-up:  1. Follow up with PCP in 1-2 weeks 2. Please obtain BMP/CBC in one week 3. Follow up with orthopedics as recommended.   Home Health:yes.  Discharge Condition:stable.  CODE STATUS:full code.  Diet recommendation: Heart Healthy   Brief/Interim Summary: [name] a 82 y.o.femalewho lives alone with medical history significant of HTN, hypothyroidism, anxiety, and recent cataract surgery (last Wednesday) presenting following a fall.  Found to have open olecranon fracture, s/p ORIF on 8/6 by orthopedics.   Discharge Diagnoses:  Principal Problem:   Olecranon fracture Active Problems:   Hypothyroidism   Essential hypertension   Chronic kidney disease (CKD), stage III (moderate)   Osteoporosis   ANEMIA-NOS   Anxiety and depression   Hx of cataract surgery   Systolic murmur  Olecranon fracture, significantly displaced, intra-articular Orthopedics consulted , s/p ORIF, Resume antibiotics.  Pain control  PT evaluation.   HTN -Elevated on presentation, likely pain-related -Continue Lisinopril -Will follow  Hypothyroidism -TSH normal in 3/17 -Continue Synthroid at current dose  CKD -Creatinine actually better than usual baseline of 1.1 -Still with GFR <60  Osteoporosis -Only taking vitamin D as an outpatient -Consider addition of calcium and possibly bisphosphonate therapy (uncertain if she has taken in the past)- defer to PCP  Anemia -Hgb stable despite fall and blood loss with laceration -Normocytic, possibly related to renal disease Outpatient follow up.   Anxiety -Takes Xanax prn -Will continue  Cataract surgery -resume eye drops.     Discharge Instructions  Discharge Instructions    Diet - low sodium heart healthy    Complete by:  As directed   Discharge instructions    Complete by:  As directed   Please follow up with orthopedics as recommended.  Please check CBC and BMP in one week.       Medication List    TAKE these medications   acetaminophen 650 MG CR tablet Commonly known as:  TYLENOL Take 650 mg by mouth 2 (two) times daily.   aspirin 81 MG chewable tablet Chew 81 mg by mouth daily.   beta carotene w/minerals tablet Take 1 tablet by mouth daily.   cephALEXin 500 MG capsule Commonly known as:  KEFLEX Take 1 capsule (500 mg total) by mouth 4 (four) times daily.   cholecalciferol 1000 units tablet Commonly known as:  VITAMIN D Take 2,000 Units by mouth daily.   docusate sodium 100 MG capsule Commonly known as:  COLACE Take 1 capsule (100 mg total) by mouth 2 (two) times daily.   HYDROcodone-acetaminophen 5-325 MG tablet Commonly known as:  NORCO Take 1-2 tablets by mouth every 4 (four) hours as needed for moderate pain.   ILEVRO 0.3 % ophthalmic suspension Generic drug:  nepafenac Place 1 drop into the left eye daily.   levothyroxine 75 MCG tablet Commonly known as:  SYNTHROID, LEVOTHROID Take 37.5-75 mcg by mouth daily before breakfast. Pt takes one-half tablet on Tuesday and Saturday.   Pt takes one whole tablet on Sunday, Monday, Wednesday, Thursday, and Friday.   lisinopril 20 MG tablet Commonly known as:  PRINIVIL,ZESTRIL Take 20 mg by mouth daily.   ofloxacin 0.3 % ophthalmic solution Commonly known as:  OCUFLOX Place 1 drop into the left eye 4 (four) times daily.   omeprazole 20  MG capsule Commonly known as:  PRILOSEC Take 1 capsule (20 mg total) by mouth daily.   ondansetron 4 MG tablet Commonly known as:  ZOFRAN Take 1 tablet (4 mg total) by mouth every 8 (eight) hours as needed for nausea or vomiting.   prednisoLONE acetate 1 % ophthalmic suspension Commonly known as:  PRED FORTE Place 1 drop into the left eye 4 (four)  times daily.      Follow-up Information    MURPHY, TIMOTHY D, MD. Schedule an appointment as soon as possible for a visit in 10 day(s).   Specialty:  Orthopedic Surgery Why:  Call office upon discharge to set up follow up appointment with Dr. Alain Marion. Contact information: [address] [place] [address] [phone]          Allergies  Allergen Reactions  . Achromycin [Tetracycline Hcl] Swelling and Other (See Comments)    Reaction:  Facial swelling   . Penicillins Anaphylaxis and Other (See Comments)    Has patient had a PCN reaction causing immediate rash, facial/tongue/throat swelling, SOB or lightheadedness with hypotension: Yes Has patient had a PCN reaction causing severe rash involving mucus membranes or skin necrosis: No Has patient had a PCN reaction that required hospitalization No Has patient had a PCN reaction occurring within the last 10 years: No If all of the above answers are "NO", then may proceed with Cephalosporin use.  . Simvastatin Other (See Comments)    Reaction:  Muscle pain   . Celecoxib Diarrhea  . Clarithromycin Other (See Comments)    Reaction:  Unknown   . Ezetimibe-Simvastatin Diarrhea, Nausea And Vomiting and Other (See Comments)    Reaction:  Muscle pain   . Flagyl [Metronidazole] Other (See Comments)    Reaction:  Unknown   . Abilify [Aripiprazole] Other (See Comments)    Reaction:  Insomnia     Consultations:  Orthopedics.    Procedures/Studies: Dg Elbow 2 Views Right  Result Date: 01/08/2016 CLINICAL DATA:  82 year old female with history of right elbow fracture status post K-wire fixation. EXAM: RIGHT ELBOW - 2 VIEW COMPARISON:  01/07/2016. FINDINGS: Previously noted displaced olecranon fracture has been reduced, with restoration of anatomic alignment. The fracture is now traversed by 2 K-wires. Proximal radius and distal humerus appear intact. Bony detail was obscured by overlying splint material. IMPRESSION:  1. Postoperative changes of K-wire fixation of olecranon fracture, with restoration of anatomic alignment, as above. Electronically Signed   By: Vinnie Langton M.D.   On: 01/08/2016 17:44   Dg Elbow Complete Right  Result Date: 01/07/2016 CLINICAL DATA:  Fall off porch tonight landing on right side with right elbow pain. Laceration. EXAM: RIGHT ELBOW - COMPLETE 3+ VIEW COMPARISON:  None. FINDINGS: Significantly displaced olecranon fracture with of 1.8 cm osseous distraction at the articular surface and proximal migration of the olecranon process. Proximal radius and distal humerus are intact. Soft tissue injury with laceration/air about the proximal ulna, proximal to the fracture site. IMPRESSION: Significantly displaced intra-articular olecranon fracture. Soft tissue injury with laceration/air adjacent to the proximal ulna, proximal to the fracture site. Electronically Signed   By: Jeb Levering M.D.   On: 01/07/2016 20:58   Dg Tibia/fibula Right  Result Date: 01/07/2016 CLINICAL DATA:  Fall off porch landing on right side with right calf swelling EXAM: RIGHT TIBIA AND FIBULA - 2 VIEW COMPARISON:  None. FINDINGS: There is no evidence of fracture or other focal bone lesions. Knee and ankle alignment is maintained.  There is diffuse soft tissue edema of the lower leg. IMPRESSION: Soft tissue edema.  No fracture of the right lower leg. Electronically Signed   By: Jeb Levering M.D.   On: 01/07/2016 20:59   Chest Portable 1 View  Result Date: 01/08/2016 CLINICAL DATA:  Elbow fracture. EXAM: PORTABLE CHEST 1 VIEW COMPARISON:  None. FINDINGS: The heart size and mediastinal contours are within normal limits. Both lungs are clear. The visualized skeletal structures are unremarkable. IMPRESSION: No active disease. Electronically Signed   By: Dorise Bullion III M.D   On: 01/08/2016 08:08       Subjective:  No new complaints.  Discharge Exam: Vitals:   01/09/16 0448 01/09/16 0900  BP: (!) 119/42  (!) 106/37  Pulse: 73 71  Resp: 16   Temp: 97.5 F (36.4 C) 98 F (36.7 C)   Vitals:   01/08/16 2021 01/09/16 0000 01/09/16 0448 01/09/16 0900  BP: (!) 129/45  (!) 119/42 (!) 106/37  Pulse: 79  73 71  Resp: 16  16   Temp: 98 F (36.7 C)  97.5 F (36.4 C) 98 F (36.7 C)  TempSrc: Oral  Oral Oral  SpO2: 98% 96% 98% 93%  Weight:      Height:        General: Pt is alert, awake, not in acute distress Cardiovascular: RRR, S1/S2 +, no rubs, no gallops Respiratory: CTA bilaterally, no wheezing, no rhonchi Abdominal: Soft, NT, ND, bowel sounds + Extremities: no edema, no cyanosis    The results of significant diagnostics from this hospitalization (including imaging, microbiology, ancillary and laboratory) are listed below for reference.     Microbiology: Recent Results (from the past 240 hour(s))  Surgical PCR screen     Status: None   Collection Time: 01/08/16  2:48 AM  Result Value Ref Range Status   MRSA, PCR NEGATIVE NEGATIVE Final   Staphylococcus aureus NEGATIVE NEGATIVE Final    Comment:        The Xpert SA Assay (FDA approved for NASAL specimens in patients over 5 years of age), is one component of a comprehensive surveillance program.  Test performance has been validated by [facility] for patients greater than or equal to 41 year old. It is not intended to diagnose infection nor to guide or monitor treatment.      Labs: BNP (last 3 results) No results for input(s): BNP in the last 8760 hours. Basic Metabolic Panel:  Recent Labs Lab 01/07/16 2236 01/08/16 1455  NA 138  --   K 4.1  --   CL 109  --   CO2 23  --   GLUCOSE 124*  --   BUN 25*  --   CREATININE 0.99 1.15*  CALCIUM 9.2  --    Liver Function Tests: No results for input(s): AST, ALT, ALKPHOS, BILITOT, PROT, ALBUMIN in the last 168 hours. No results for input(s): LIPASE, AMYLASE in the last 168 hours. No results for input(s): AMMONIA in the last 168 hours. CBC:  Recent Labs Lab  01/07/16 2236 01/08/16 1455  WBC 11.3* 7.9  NEUTROABS 9.3*  --   HGB 11.1* 10.1*  HCT 33.2* 32.0*  MCV 86.7 89.9  PLT 218 191   Cardiac Enzymes: No results for input(s): CKTOTAL, CKMB, CKMBINDEX, TROPONINI in the last 168 hours. BNP: Invalid input(s): POCBNP CBG: No results for input(s): GLUCAP in the last 168 hours. D-Dimer No results for input(s): DDIMER in the last 72 hours. Hgb A1c No results for input(s): HGBA1C  in the last 72 hours. Lipid Profile No results for input(s): CHOL, HDL, LDLCALC, TRIG, CHOLHDL, LDLDIRECT in the last 72 hours. Thyroid function studies No results for input(s): TSH, T4TOTAL, T3FREE, THYROIDAB in the last 72 hours.  Invalid input(s): FREET3 Anemia work up No results for input(s): VITAMINB12, FOLATE, FERRITIN, TIBC, IRON, RETICCTPCT in the last 72 hours. Urinalysis    Component Value Date/Time   COLORURINE YELLOW 11/08/2013 2123   APPEARANCEUR CLEAR 11/08/2013 2123   LABSPEC 1.008 11/08/2013 2123   PHURINE 6.5 11/08/2013 2123   GLUCOSEU NEGATIVE 11/08/2013 2123   HGBUR NEGATIVE 11/08/2013 2123   BILIRUBINUR NEGATIVE 11/08/2013 2123   KETONESUR NEGATIVE 11/08/2013 2123   PROTEINUR NEGATIVE 11/08/2013 2123   UROBILINOGEN 0.2 11/08/2013 2123   NITRITE NEGATIVE 11/08/2013 2123   LEUKOCYTESUR TRACE (A) 11/08/2013 2123   Sepsis Labs Invalid input(s): PROCALCITONIN,  WBC,  LACTICIDVEN Microbiology Recent Results (from the past 240 hour(s))  Surgical PCR screen     Status: None   Collection Time: 01/08/16  2:48 AM  Result Value Ref Range Status   MRSA, PCR NEGATIVE NEGATIVE Final   Staphylococcus aureus NEGATIVE NEGATIVE Final    Comment:        The Xpert SA Assay (FDA approved for NASAL specimens in patients over 73 years of age), is one component of a comprehensive surveillance program.  Test performance has been validated by [facility] for patients greater than or equal to 38 year old. It is not intended to diagnose infection nor  to guide or monitor treatment.      Time coordinating discharge: Over 30 minutes  SIGNED:   Hosie Poisson, MD  [facility] 01/09/2016, 4:15 PM Pager   If 7PM-7AM, please contact night-coverage [email or web address] Password [email or web address]

## 2016-01-09 NOTE — Progress Notes (Signed)
   Assessment: 1 Day Post-Op  S/P Procedure(s) (LRB): OPEN REDUCTION INTERNAL FIXATION (ORIF) ELBOW/OLECRANON FRACTURE, NEUROLYSIS (Right) IRRIGATION AND DEBRIDEMENT OF OLECRANON FRACTURE (Right) by Dr. Ernesta Amble. Percell [name] on 01/08/16  Principal Problem:   Olecranon fracture Active Problems:   Hypothyroidism   Essential hypertension   Chronic kidney disease (CKD), stage [name] (moderate)   Osteoporosis   ANEMIA-NOS   Anxiety and depression   Hx of cataract surgery   Systolic murmur  Plan: Continue PO ABX therapy at home due to open fracture. Use Sling for comfort. Elevate and apply ice frequently  Okay for discharge from an orthopedic perspective. F/U in the office in 1-2 weeks. Please call with questions.  Weight Bearing: Non Weight Bearing (NWB) Right Arm Dressings: Splint, Ace wrap VTE prophylaxis: per primary, ambulation Dispo: Home in care of her daughter.  Subjective: Patient reports pain as mild, improving, and controlled with PO meds.  Tolerating diet.  Urinating.  No CP, SOB.  OOB in room.  Objective:   VITALS:   Vitals:   01/08/16 1435 01/08/16 2021 01/09/16 0000 01/09/16 0448  BP: (!) 119/39 (!) 129/45  (!) 119/42  Pulse: 67 79  73  Resp: 20 16  16   Temp:  98 F (36.7 C)  97.5 F (36.4 C)  TempSrc:  Oral  Oral  SpO2: 93% 98% 96% 98%  Weight:      Height:        Physical Exam General: NAD.  Daughter at bedside. Resp: No increased WOB. Cardio: regular rate and rhythm ABD protuberant, soft Neurologically intact, conversant MSK Right Arm:  Sling / splint in place.   Neurovascularly intact Sensation intact distally Good cap refill   [name] 01/09/2016, 7:49 AM

## 2016-01-09 NOTE — Evaluation (Signed)
Occupational Therapy Evaluation Patient Details Name: [name] MRN: [id number] DOB: [birth year] Today's Date: 01/09/2016    History of Present Illness OPEN REDUCTION INTERNAL FIXATION (ORIF) ELBOW/OLECRANON FRACTURE   Clinical Impression   Pt with decline in function and safety with ADLs and ADL mobility with decreased strength, balance, endurance. Pt;s granddaughter present during session. Pt and family educated on R shoulder and finger ROM, sling wear and R UE positioning for edema control. OT recommended 24 hour sup and assist with ADLs at home and pt's granddaughter stated that family could take turns, but that for about 4 hours in the afternoon pt would be alone so they would have to work something out. Pt stated that she can just stay in a chair or the bed while she's alone. OT educated pt on the risks for staying in bed or chair for long periods of time and that she may need to go to the restroom and that she wouldn't have anyone there to assist her as she is a fall risk. Pt would benefit from acute OT services to address impairments to increase level of function and safety    Follow Up Recommendations  Supervision/Assistance - 24 hour;Home health OT (progress rehab of R UE/elbow per MD as appropriate)    Equipment Recommendations  3 in 1 bedside comode;Tub/shower bench    Recommendations for Other Services PT consult     Precautions / Restrictions Precautions Precautions: Fall Precaution Comments: sling for R UE comfort Required Braces or Orthoses: Sling Restrictions Weight Bearing Restrictions: Yes RUE Weight Bearing: Non weight bearing      Mobility Bed Mobility Overal bed mobility: Needs Assistance Bed Mobility: Supine to Sit     Supine to sit: HOB elevated;Mod assist     General bed mobility comments: pt used rails, required mod A to elevate trunk  Transfers Overall transfer level: Needs assistance Equipment used: 1 person hand held assist Transfers:  Sit to/from Stand Sit to Stand: Min assist         General transfer comment: pt unsteady with "teetering" during ambulation to bathroom. Pt reports pain/soreness in R LE    Balance Overall balance assessment: Needs assistance   Sitting balance-Leahy Scale: Fair       Standing balance-Leahy Scale: Poor                              ADL Overall ADL's : Needs assistance/impaired     Grooming: Wash/dry hands;Wash/dry face;Standing;Min guard;Minimal assistance Grooming Details (indicate cue type and reason): min - min guard A for balance/safety Upper Body Bathing: Moderate assistance   Lower Body Bathing: Maximal assistance   Upper Body Dressing : Moderate assistance   Lower Body Dressing: Total assistance   Toilet Transfer: Minimal assistance;Grab bars;Comfort height toilet;Ambulation;Cueing for safety   Toileting- Clothing Manipulation and Hygiene: Total assistance;Sit to/from stand     Tub/Shower Transfer Details (indicate cue type and reason): pt has tub shower at home. Pt reports that her dauhgter has one that she can use Functional mobility during ADLs: Minimal assistance;Cueing for safety       Vision  wears reading glasses, no change from baseline              Pertinent Vitals/Pain Pain Assessment: 0-10 Pain Score: 3  Pain Location: R UE Pain Descriptors / Indicators: Sore Pain Intervention(s): Monitored during session;RN gave pain meds during session;Repositioned     Hand Dominance Right   Extremity/Trunk  Assessment Upper Extremity Assessment Upper Extremity Assessment: RUE deficits/detail RUE Deficits / Details: long arm splint, sling for comfort RUE: Unable to fully assess due to immobilization   Lower Extremity Assessment Lower Extremity Assessment: Defer to PT evaluation       Communication Communication Communication: No difficulties   Cognition Arousal/Alertness: Awake/alert Behavior During Therapy: WFL for tasks  assessed/performed Overall Cognitive Status: Within Functional Limits for tasks assessed                     General Comments   pt very pleasant and cooperative, family supportive                 Home Living Family/patient expects to be discharged to:: Private residence Living Arrangements: Alone Available Help at Discharge: Family (pt would be alone from 2-6 pm per her grandaughter) Type of Home: House Home Access: Stairs to enter [employer] of Steps: 3 Entrance Stairs-Rails: Right Home Layout: One level     Bathroom Shower/Tub: Teacher, early years/pre: Handicapped height     Home Equipment: None;Adaptive equipment Adaptive Equipment: Reacher Additional Comments: Pt reports that she has access to DME. Pt's daughter has 3 in 1 and tub bench from previous ortho surgeries      Prior Functioning/Environment Level of Independence: Independent             OT Diagnosis: Acute pain;Generalized weakness   OT Problem List: Decreased strength;Impaired balance (sitting and/or standing);Decreased knowledge of precautions;Decreased activity tolerance;Decreased knowledge of use of DME or AE;Impaired UE functional use;Decreased range of motion;Pain;Increased edema   OT Treatment/Interventions: Self-care/ADL training;Patient/family education;Therapeutic exercise;Therapeutic activities;DME and/or AE instruction;Balance training    OT Goals(Current goals can be found in the care plan section) Acute Rehab OT Goals Patient Stated Goal: go home and get better OT Goal Formulation: With patient/family Time For Goal Achievement: 01/16/16 Potential to Achieve Goals: Good ADL Goals Pt Will Perform Grooming: with min guard assist;with supervision;with set-up;standing Pt Will Perform Upper Body Bathing: with min assist;with caregiver independent in assisting Pt Will Perform Lower Body Bathing: with mod assist;with caregiver independent in assisting Pt Will  Perform Upper Body Dressing: with min assist;with caregiver independent in assisting Pt Will Perform Lower Body Dressing: with max assist;with mod assist;with caregiver independent in assisting Pt Will Transfer to Toilet: with min guard assist;ambulating;bedside commode (3 IN 1 OVER TOILET) Pt Will Perform Toileting - Clothing Manipulation and hygiene: with max assist;with mod assist;with caregiver independent in assisting Pt Will Perform Tub/Shower Transfer: with min guard assist;with supervision;tub bench;with caregiver independent in assisting  OT Frequency: Min 2X/week   Barriers to D/C:    Pt's grandaughter reports that family can assist her and provide supervision for mpst of the day, except for about 4 hours per day                     End of Session Equipment Utilized During Treatment: Gait belt;Other (comment) (sling) Nurse Communication: Mobility status  Activity Tolerance: Patient tolerated treatment well Patient left: in chair;with call bell/phone within reach;with family/visitor present   Time: [id number] OT Time Calculation (min): 42 min Charges:  OT General Charges $OT Visit: 1 Procedure OT Evaluation $OT Eval Moderate Complexity: 1 Procedure OT Treatments $Self Care/Home Management : 8-22 mins $Therapeutic Activity: 8-22 mins G-Codes:    Britt Bottom 01/09/2016, 12:09 PM

## 2016-01-09 NOTE — Evaluation (Signed)
Physical Therapy Evaluation Patient Details Name: [name] MRN: [name] DOB: Jun 30, 1927 Today's Date: 01/09/2016   History of Present Illness  pt presents with R Olecranon fx s/p ORIF.  pt with hx of HTN and Fibromyalgia.    Clinical Impression  Pt generally weak and deconditioned.  Pt ed on use of cane and need for family support at D/C for safety with mobility and homemaking tasks.  Will continue to follow pt while on acute.      Follow Up Recommendations Home health PT;Supervision/Assistance - 24 hour    Equipment Recommendations  Cane    Recommendations for Other Services       Precautions / Restrictions Precautions Precautions: Fall Precaution Comments: sling for R UE comfort Required Braces or Orthoses: Sling Restrictions Weight Bearing Restrictions: Yes RUE Weight Bearing: Non weight bearing      Mobility  Bed Mobility Overal bed mobility: Needs Assistance Bed Mobility: Supine to Sit     Supine to sit: Mod assist;HOB elevated     General bed mobility comments: pt used rails, required mod A to elevate trunk  Transfers Overall transfer level: Needs assistance Equipment used: 1 person hand held assist;Straight cane Transfers: Sit to/from Stand Sit to Stand: Min assist;Min guard         General transfer comment: pt initially MinA, but with repeated transfers and cues for use of L armrest on chair, pt became MinG.    Ambulation/Gait Ambulation/Gait assistance: Min guard Ambulation Distance (Feet): 30 Feet (and 10) Assistive device: 1 person hand held assist;Straight cane Gait Pattern/deviations: Step-through pattern;Decreased stride length;Trunk flexed     General Gait Details: pt initially ambulating with L HHA, but then trialed cane.  pt ed on use of cane for balance.    Stairs Stairs: Yes Stairs assistance: Min assist Stair Management: One rail Left;Step to pattern;Forwards Number of Stairs: 2 General stair comments: pt needs cues fro  stair sequencing.    Wheelchair Mobility    Modified Rankin (Stroke Patients Only)       Balance Overall balance assessment: Needs assistance Sitting-balance support: No upper extremity supported;Feet supported Sitting balance-Leahy Scale: Fair     Standing balance support: No upper extremity supported;Single extremity supported;During functional activity Standing balance-Leahy Scale: Poor                               Pertinent Vitals/Pain Pain Assessment: 0-10 Pain Score: 3  Pain Location: R UE Pain Descriptors / Indicators: Sore Pain Intervention(s): Monitored during session;Premedicated before session;Repositioned    Home Living Family/patient expects to be discharged to:: Private residence Living Arrangements: Alone Available Help at Discharge: Family;Available PRN/intermittently (pt would be alone from 2-6pm each day.) Type of Home: House Home Access: Stairs to enter Entrance Stairs-Rails: Right Entrance Stairs-Number of Steps: 3 Home Layout: One level Home Equipment: None;Adaptive equipment Additional Comments: Pt reports that she has access to DME. Pt's daughter has 3 in 1 and tub bench from previous ortho surgeries    Prior Function Level of Independence: Independent               Hand Dominance   Dominant Hand: Right    Extremity/Trunk Assessment   Upper Extremity Assessment: Defer to OT evaluation RUE Deficits / Details: long arm splint, sling for comfort RUE: Unable to fully assess due to immobilization       Lower Extremity Assessment: Generalized weakness      Cervical / Trunk  Assessment: Kyphotic  Communication   Communication: No difficulties  Cognition Arousal/Alertness: Awake/alert Behavior During Therapy: WFL for tasks assessed/performed Overall Cognitive Status: Within Functional Limits for tasks assessed                      General Comments      Exercises        Assessment/Plan    PT  Assessment Patient needs continued PT services  PT Diagnosis Difficulty walking;Generalized weakness   PT Problem List Decreased strength;Decreased activity tolerance;Decreased balance;Decreased mobility;Decreased knowledge of use of DME;Obesity  PT Treatment Interventions DME instruction;Gait training;Stair training;Functional mobility training;Therapeutic activities;Therapeutic exercise;Balance training;Patient/family education   PT Goals (Current goals can be found in the Care Plan section) Acute Rehab PT Goals Patient Stated Goal: go home and get better PT Goal Formulation: With patient Time For Goal Achievement: 01/16/16 Potential to Achieve Goals: Good    Frequency Min 3X/week   Barriers to discharge        Co-evaluation               End of Session Equipment Utilized During Treatment: Gait belt (Sling) Activity Tolerance: Patient limited by fatigue Patient left: in chair;with call bell/phone within reach;with chair alarm set Nurse Communication: Mobility status         Time: [id number] PT Time Calculation (min) (ACUTE ONLY): 37 min   Charges:   PT Evaluation $PT Eval Moderate Complexity: 1 Procedure PT Treatments $Gait Training: 8-22 mins   PT G CodesCatarina Hartshorn, [place] 01/09/2016, 3:28 PM

## 2016-01-10 ENCOUNTER — Telehealth: Payer: Self-pay | Admitting: *Deleted

## 2016-01-10 NOTE — Telephone Encounter (Signed)
Daughter called back completed call below.../lmb  Transition Care Management Follow-up Telephone Call   Date discharged? 01/09/16   How have you been since you were released from the hospital? Pt daughter states she is doing just fine   Do you understand why you were in the hospital? YES   Do you understand the discharge instructions? YES   Where were you discharged to? Home   Items Reviewed:  Medications reviewed: YES  Allergies reviewed: YES  Dietary changes reviewed: YES  Referrals reviewed: No referral needed   Functional Questionnaire:   Activities of Daily Living (ADLs):   She states she are independent in the following: bathing and hygiene, feeding, continence, grooming, toileting and dressing States she require assistance with the following: ambulation   Any transportation issues/concerns?: NO   Any patient concerns? NO   Confirmed importance and date/time of follow-up visits scheduled YES, made appt 01/20/16  Provider Appointment booked with Dr. Jenny Reichmann due to PCP not available  Confirmed with patient if condition begins to worsen call PCP or go to the ER.  Patient was given the office number and encouraged to call back with question or concerns.  : YES

## 2016-01-10 NOTE — Telephone Encounter (Signed)
Called pt to set-up TCM hosp f/u spoke w/daughter Opal Sidles) she states mom is there and they have homestead there trying to get mom with a sitter. Wanting to finish up with them, and daughter will call bck...Johny Chess

## 2016-01-18 DIAGNOSIS — M25552 Pain in left hip: Secondary | ICD-10-CM | POA: Diagnosis not present

## 2016-01-18 DIAGNOSIS — S52021D Displaced fracture of olecranon process without intraarticular extension of right ulna, subsequent encounter for closed fracture with routine healing: Secondary | ICD-10-CM | POA: Diagnosis not present

## 2016-01-18 DIAGNOSIS — M25551 Pain in right hip: Secondary | ICD-10-CM | POA: Diagnosis not present

## 2016-01-20 ENCOUNTER — Encounter: Payer: Self-pay | Admitting: Internal Medicine

## 2016-01-20 ENCOUNTER — Ambulatory Visit (HOSPITAL_COMMUNITY)
Admission: RE | Admit: 2016-01-20 | Discharge: 2016-01-20 | Disposition: A | Discharge status: Home / Self Care | Payer: Medicare Other | Source: Ambulatory Visit | Attending: Vascular Surgery | Admitting: Vascular Surgery

## 2016-01-20 ENCOUNTER — Ambulatory Visit (INDEPENDENT_AMBULATORY_CARE_PROVIDER_SITE_OTHER): Payer: Medicare Other | Admitting: Internal Medicine

## 2016-01-20 VITALS — BP 124/76 | HR 100 | Temp 98.6°F | Resp 20 | Wt 157.0 lb

## 2016-01-20 DIAGNOSIS — M7989 Other specified soft tissue disorders: Secondary | ICD-10-CM | POA: Insufficient documentation

## 2016-01-20 DIAGNOSIS — E039 Hypothyroidism, unspecified: Secondary | ICD-10-CM | POA: Diagnosis not present

## 2016-01-20 DIAGNOSIS — R609 Edema, unspecified: Secondary | ICD-10-CM

## 2016-01-20 DIAGNOSIS — M79604 Pain in right leg: Secondary | ICD-10-CM

## 2016-01-20 DIAGNOSIS — M25552 Pain in left hip: Secondary | ICD-10-CM | POA: Diagnosis not present

## 2016-01-20 DIAGNOSIS — I5031 Acute diastolic (congestive) heart failure: Secondary | ICD-10-CM

## 2016-01-20 DIAGNOSIS — D649 Anemia, unspecified: Secondary | ICD-10-CM

## 2016-01-20 DIAGNOSIS — R600 Localized edema: Secondary | ICD-10-CM | POA: Insufficient documentation

## 2016-01-20 DIAGNOSIS — I10 Essential (primary) hypertension: Secondary | ICD-10-CM | POA: Insufficient documentation

## 2016-01-20 DIAGNOSIS — M25561 Pain in right knee: Secondary | ICD-10-CM | POA: Insufficient documentation

## 2016-01-20 DIAGNOSIS — E785 Hyperlipidemia, unspecified: Secondary | ICD-10-CM | POA: Diagnosis not present

## 2016-01-20 MED ORDER — FUROSEMIDE 20 MG PO TABS: 20.0000 mg | ORAL_TABLET | Freq: Every day | ORAL | 11 refills | Status: DC | PRN

## 2016-01-20 NOTE — Progress Notes (Signed)
Subjective:    Patient ID: [name], female    DOB: 1928-05-31, 82 y.o.   MRN: [name]  HPI    Here to /fu post hospn after faall with right elbow fracture s/p surgury aug 6, course relatviely uncomplicated, did have midl ABL anemia on top of mild Chronic anemia (normal iron and b12 2016);   Pt denies fever, wt loss, night sweats, loss of appetite, or other constitutional symptoms.  Appetite improving actually. Wt Readings from Last 3 Encounters:  01/20/16 157 lb (71.2 kg)  01/08/16 154 lb 5.2 oz (70 kg)  08/19/15 151 lb (68.5 kg)  Declines DXA for now.  Now has hydrocodone only 1 qhs.  And taking ibuprofen though she know Dr hopper has frowned on nsaid use due to renal risk.  Has been on diuretic in the past but stopped due to low sodium (? HCT).  Stopped about 2 yrs ago. More recently has had 3-4 mo worsening LE edema prior to fall. Currently has acute chronic right leg bruising and swelling as well but improving.  No hx of heart disease, has had what sounds like an echo yrs ago but none on EMR.  Pt denies chest pain, increased sob or doe, wheezing, orthopnea, PND, palpitations, dizziness or syncope. Also tx last yr for h pylori gastritis, Denies worsening reflux, abd pain, dysphagia, n/v, bowel change or blood. Past Medical History:  Diagnosis Date  . Adrenal myelolipoma   . DIVERTICULOSIS, COLON   . Dysmetabolic syndrome X   . FIBROMYALGIA   . Gastric ulcer due to Helicobacter pylori   . Hiatal hernia   . HYPERLIPIDEMIA   . Hyperplastic colon polyp   . HYPERTENSION, ESSENTIAL NOS   . HYPERTROPHIC CARDIOMYOPATHY   . Hyponatremia   . Hypothyroidism   . MYCOSIS FUNGOIDES   . Nodule of left lung   . Normocytic anemia   . OSTEOARTHRITIS   . OSTEOPOROSIS   . Other abnormal glucose   . Renal angiomyolipoma   . RENAL INSUFFICIENCY   . Unspecified vitamin D deficiency    Past Surgical History:  Procedure Laterality Date  . colonoscopy with polypectomy  07/25/2007   Dr  Olevia Perches, Recheck in 7 years for 2009  . G 4 P 2    . I&D EXTREMITY Right 01/08/2016   Procedure: IRRIGATION AND DEBRIDEMENT OF OLECRANON FRACTURE;  Surgeon: Renette Butters, MD;  Location: [place];  Service: Orthopedics;  Laterality: Right;  . ORIF ELBOW FRACTURE Right 01/08/2016   Procedure: OPEN REDUCTION INTERNAL FIXATION (ORIF) ELBOW/OLECRANON FRACTURE, NEUROLYSIS;  Surgeon: Renette Butters, MD;  Location: [place];  Service: Orthopedics;  Laterality: Right;    reports that she quit smoking about 52 years ago. She has never used smokeless tobacco. She reports that she does not drink alcohol or use drugs. family history includes Diabetes in her mother; Heart attack (age of onset: 50) in her maternal uncle; Hypertension in her maternal grandmother; Stroke (age of onset: 57) in her maternal grandmother. Allergies  Allergen Reactions  . Achromycin [Tetracycline Hcl] Swelling and Other (See Comments)    Reaction:  Facial swelling   . Penicillins Anaphylaxis and Other (See Comments)    Has patient had a PCN reaction causing immediate rash, facial/tongue/throat swelling, SOB or lightheadedness with hypotension: Yes Has patient had a PCN reaction causing severe rash involving mucus membranes or skin necrosis: No Has patient had a PCN reaction that required hospitalization No Has patient had a PCN reaction occurring within the  last 10 years: No If all of the above answers are "NO", then may proceed with Cephalosporin use.  . Simvastatin Other (See Comments)    Reaction:  Muscle pain   . Celecoxib Diarrhea  . Clarithromycin Other (See Comments)    Reaction:  Unknown   . Ezetimibe-Simvastatin Diarrhea, Nausea And Vomiting and Other (See Comments)    Reaction:  Muscle pain   . Flagyl [Metronidazole] Other (See Comments)    Reaction:  Unknown   . Abilify [Aripiprazole] Other (See Comments)    Reaction:  Insomnia    Current Outpatient Prescriptions on File Prior to Visit  Medication Sig Dispense Refill   . aspirin 81 MG chewable tablet Chew 81 mg by mouth daily.     . beta carotene w/minerals (OCUVITE) tablet Take 1 tablet by mouth daily.    . cholecalciferol (VITAMIN D) 1000 UNITS tablet Take 2,000 Units by mouth daily.     Marland Kitchen docusate sodium (COLACE) 100 MG capsule Take 1 capsule (100 mg total) by mouth 2 (two) times daily. 60 capsule 0  . HYDROcodone-acetaminophen (NORCO) 5-325 MG tablet Take 1-2 tablets by mouth every 4 (four) hours as needed for moderate pain. 40 tablet 0  . levothyroxine (SYNTHROID, LEVOTHROID) 75 MCG tablet Take 37.5-75 mcg by mouth daily before breakfast. Pt takes one-half tablet on Tuesday and Saturday.   Pt takes one whole tablet on Sunday, Monday, Wednesday, Thursday, and Friday.    Marland Kitchen lisinopril (PRINIVIL,ZESTRIL) 20 MG tablet Take 20 mg by mouth daily.    . nepafenac (ILEVRO) 0.3 % ophthalmic suspension Place 1 drop into the left eye daily.    Marland Kitchen ofloxacin (OCUFLOX) 0.3 % ophthalmic solution Place 1 drop into the left eye 4 (four) times daily.    Marland Kitchen omeprazole (PRILOSEC) 20 MG capsule Take 1 capsule (20 mg total) by mouth daily. 30 capsule 5  . ondansetron (ZOFRAN) 4 MG tablet Take 1 tablet (4 mg total) by mouth every 8 (eight) hours as needed for nausea or vomiting. 40 tablet 0  . prednisoLONE acetate (PRED FORTE) 1 % ophthalmic suspension Place 1 drop into the left eye 4 (four) times daily.     No current facility-administered medications on file prior to visit.    Review of Systems  Constitutional: Negative for unusual diaphoresis or night sweats HENT: Negative for ear swelling or discharge Eyes: Negative for worsening visual haziness  Respiratory: Negative for choking and stridor.   Gastrointestinal: Negative for distension or worsening eructation Genitourinary: Negative for retention or change in urine volume.  Musculoskeletal: Negative for other MSK pain or swelling Skin: Negative for color change and worsening wound Neurological: Negative for tremors and  numbness other than noted  Psychiatric/Behavioral: Negative for decreased concentration or agitation other than above       Objective:   Physical Exam BP 124/76   Pulse 100   Temp 98.6 F (37 C) (Oral)   Resp 20   Wt 157 lb (71.2 kg)   SpO2 96%   BMI 30.66 kg/m  VS noted,  Constitutional: Pt appears in no apparent distress HENT: Head: NCAT.  Right Ear: External ear normal.  Left Ear: External ear normal.  Eyes: . Pupils are equal, round, and reactive to light. Conjunctivae and EOM are normal Neck: Normal range of motion. Neck supple.  Cardiovascular: Normal rate and regular rhythm.   Pulmonary/Chest: Effort normal and breath sounds without rales or wheezing.  Abd:  Soft, NT, ND, + BS Neurological: Pt is alert. Not confused ,  motor grossly intact Skin: Skin is warm. No rash, 1-2+ right > left LE edema, right mid leg contused as well but diffuse tender, no erythema Left lateral hip with tender over greater trochanter Psychiatric: Pt behavior is normal. No agitation.  RIght elbow in brace/wrapped  ECG aug 6 - NSR - PVC, NSTTTW changes only      Assessment & Plan:

## 2016-01-20 NOTE — Patient Instructions (Addendum)
Please take all new medication as prescribed - the fluid pill ONLY if you have still swelling in the left leg (you cant really go by the right leg after the trauma)  Please go to LAB only for the kidney function testing on the fluid pill in 2 weeks  Please continue all other medications as before, and refills have been done if requested.  Please have the pharmacy call with any other refills you may need.  Please keep your appointments with your specialists as you may have planned  You will be contacted regarding the referral for: Right leg venous doppler (to make sure no blood clot), Echocardiogram (for the leg swelling), and Dr Tamala Julian for the left hip pain  Please see [facility] now for the Right leg test  Please see Dr Quay Burow as you have scheduled for follow up

## 2016-01-20 NOTE — Progress Notes (Signed)
Pre visit review using our clinic review tool, if applicable. No additional management support is needed unless otherwise documented below in the visit note. 

## 2016-01-21 NOTE — Assessment & Plan Note (Signed)
Likely due to contusion, also for venous doppler - r/o dvt

## 2016-01-21 NOTE — Assessment & Plan Note (Signed)
Mild, for lasix 20 qd prn only,  to f/u any worsening symptoms or concerns

## 2016-01-21 NOTE — Assessment & Plan Note (Signed)
.  stable overall by history and exam, recent data reviewed with pt, and pt to continue medical treatment as before,  to f/u any worsening symptoms or concerns Lab Results  Component Value Date   WBC 7.9 01/08/2016   HGB 10.1 (L) 01/08/2016   HCT 32.0 (L) 01/08/2016   MCV 89.9 01/08/2016   PLT 191 01/08/2016  '

## 2016-01-21 NOTE — Assessment & Plan Note (Signed)
C/w likely bursitis, for sport med referral

## 2016-01-24 ENCOUNTER — Telehealth: Payer: Self-pay | Admitting: Internal Medicine

## 2016-01-24 NOTE — Telephone Encounter (Signed)
let her know the ultrasound of her leg showed no blood clot

## 2016-01-24 NOTE — Telephone Encounter (Signed)
Spoke with pt to inform.  

## 2016-01-27 DIAGNOSIS — M25551 Pain in right hip: Secondary | ICD-10-CM | POA: Diagnosis not present

## 2016-02-15 ENCOUNTER — Telehealth: Payer: Self-pay | Admitting: *Deleted

## 2016-02-15 NOTE — Telephone Encounter (Signed)
I spoke with [name] today,patient refused to schedule her echo because her arm is broken,she said she will call Dr.John's office when she is ready to schedule.

## 2016-02-17 DIAGNOSIS — S52021B Displaced fracture of olecranon process without intraarticular extension of right ulna, initial encounter for open fracture type I or II: Secondary | ICD-10-CM | POA: Diagnosis not present

## 2016-02-19 ENCOUNTER — Encounter: Payer: Self-pay | Admitting: Internal Medicine

## 2016-02-19 NOTE — Patient Instructions (Addendum)
  Test(s) ordered today. Your results will be released to MyChart (or called to you) after review, usually within 72hours after test completion. If any changes need to be made, you will be notified at that same time.  All other Health Maintenance issues reviewed.   All recommended immunizations and age-appropriate screenings are up-to-date or discussed.  Flu vaccine administered today.   Medications reviewed and updated.  No changes recommended at this time.  Your prescription(s) have been submitted to your pharmacy. Please take as directed and contact our office if you believe you are having problem(s) with the medication(s).   Please followup in 6 months   

## 2016-02-19 NOTE — Progress Notes (Signed)
Subjective:    Patient ID: [name], female    DOB: 1927-12-08, 82 y.o.   MRN: [name]  HPI The patient is here for follow up.  Hypertension: She is taking her medication daily. She is compliant with a low sodium diet.  She denies chest pain, palpitations, shortness of breath and regular headaches. She is exercising regularly.  She does not monitor her blood pressure at home.    Edema:  Right leg was swelling after fall and during summer.  She took the lasix twice.  Her swelling is much better, but still present in her right leg.    Hypothyroidism:  She is taking her medication daily.  She denies any recent changes in energy or weight that are unexplained.   CKD:  She does not take advil or aleve.  She takes tylenol only as needed.    GERD:  She is taking her medication daily as prescribed.  She denies any GERD symptoms and feels her GERD is well controlled.   Occasional anxiety;  She does have anxiety on occasion.  She has taken lorzepam on occasion.     Medications and allergies reviewed with patient and updated if appropriate.  Patient Active Problem List   Diagnosis Date Noted  . Right leg pain 01/20/2016  . Left hip pain 01/20/2016  . Peripheral edema 01/20/2016  . Systolic murmur [id number]  . Olecranon fracture 01/07/2016  . Hallucination, visual 11/08/2013  . Depression 07/27/2013  . Generalized weakness 07/18/2013  . Anxiety and depression 07/18/2013  . Nodule of left lung 07/18/2013  . Normocytic anemia 07/18/2013  . HYPERTROPHIC CARDIOMYOPATHY 08/23/2009  . ABNORMAL ELECTROCARDIOGRAM 07/18/2009  . Unspecified vitamin D deficiency 01/21/2009  . Chronic kidney disease (CKD), stage III (moderate) 01/21/2009  . History of colonic polyps 01/21/2009  . ANEMIA-NOS 01/21/2009  . MYCOSIS FUNGOIDES 05/21/2008  . DIVERTICULOSIS, COLON 05/21/2008  . OSTEOARTHRITIS 06/02/2007  . Hypothyroidism 04/28/2007  . HYPERLIPIDEMIA 04/28/2007  . METABOLIC SYNDROME X  [id number]  . Essential hypertension 04/28/2007  . Osteoporosis 04/28/2007  . FIBROMYALGIA 11/14/2006    Current Outpatient Prescriptions on File Prior to Visit  Medication Sig Dispense Refill  . aspirin 81 MG chewable tablet Chew 81 mg by mouth daily.     . beta carotene w/minerals (OCUVITE) tablet Take 1 tablet by mouth daily.    . cholecalciferol (VITAMIN D) 1000 UNITS tablet Take 2,000 Units by mouth daily.     Marland Kitchen docusate sodium (COLACE) 100 MG capsule Take 1 capsule (100 mg total) by mouth 2 (two) times daily. 60 capsule 0  . furosemide (LASIX) 20 MG tablet Take 1 tablet (20 mg total) by mouth daily as needed. 30 tablet 11  . levothyroxine (SYNTHROID, LEVOTHROID) 75 MCG tablet Take 37.5-75 mcg by mouth daily before breakfast. Pt takes one-half tablet on Tuesday and Saturday.   Pt takes one whole tablet on Sunday, Monday, Wednesday, Thursday, and Friday.    Marland Kitchen lisinopril (PRINIVIL,ZESTRIL) 20 MG tablet Take 20 mg by mouth daily.    . nepafenac (ILEVRO) 0.3 % ophthalmic suspension Place 1 drop into the left eye daily.    Marland Kitchen ofloxacin (OCUFLOX) 0.3 % ophthalmic solution Place 1 drop into the left eye 4 (four) times daily.    Marland Kitchen omeprazole (PRILOSEC) 20 MG capsule Take 1 capsule (20 mg total) by mouth daily. 30 capsule 5  . ondansetron (ZOFRAN) 4 MG tablet Take 1 tablet (4 mg total) by mouth every 8 (eight) hours as needed for nausea  or vomiting. 40 tablet 0  . prednisoLONE acetate (PRED FORTE) 1 % ophthalmic suspension Place 1 drop into the left eye 4 (four) times daily.     No current facility-administered medications on file prior to visit.     Past Medical History:  Diagnosis Date  . Adrenal myelolipoma   . DIVERTICULOSIS, COLON   . Dysmetabolic syndrome X   . FIBROMYALGIA   . Gastric ulcer due to Helicobacter pylori   . Hiatal hernia   . Hx of cataract surgery 01/08/2016  . HYPERLIPIDEMIA   . Hyperplastic colon polyp   . HYPERTENSION, ESSENTIAL NOS   . HYPERTROPHIC  CARDIOMYOPATHY   . Hyponatremia   . Hypothyroidism   . MYCOSIS FUNGOIDES   . Nodule of left lung   . Normocytic anemia   . OSTEOARTHRITIS   . OSTEOPOROSIS   . Other abnormal glucose   . Renal angiomyolipoma   . RENAL INSUFFICIENCY   . Unspecified vitamin D deficiency     Past Surgical History:  Procedure Laterality Date  . colonoscopy with polypectomy  07/25/2007   Dr Olevia Perches, Recheck in 7 years for 2009  . G 4 P 2    . I&D EXTREMITY Right 01/08/2016   Procedure: IRRIGATION AND DEBRIDEMENT OF OLECRANON FRACTURE;  Surgeon: Renette Butters, MD;  Location: [place];  Service: Orthopedics;  Laterality: Right;  . ORIF ELBOW FRACTURE Right 01/08/2016   Procedure: OPEN REDUCTION INTERNAL FIXATION (ORIF) ELBOW/OLECRANON FRACTURE, NEUROLYSIS;  Surgeon: Renette Butters, MD;  Location: [place];  Service: Orthopedics;  Laterality: Right;    Social History   Social History  . Marital status: Married    Spouse name: N/A  . Number of children: 2  . Years of education: N/A   Occupational History  . Retired Retired   Social History Main Topics  . Smoking status: Former Smoker    Quit date: 06/05/1963  . Smokeless tobacco: Never Used     Comment: smoked 1951-1965 , up to 1/2 ppd  . Alcohol use No  . Drug use: No  . Sexual activity: Not on file   Other Topics Concern  . Not on file   Social History Narrative   Widowed.  Lives alone.  Ambulates without assistance.  Steady on feet.    Family History  Problem Relation Age of Onset  . Diabetes Mother   . Heart attack Maternal Uncle 75    his 2 sons had MI in 58s  . Hypertension Maternal Grandmother   . Stroke Maternal Grandmother 69  . Cancer Neg Hx   . Asthma Neg Hx   . COPD Neg Hx     Review of Systems  Constitutional: Negative for chills and fever.  Respiratory: Negative for cough, shortness of breath and wheezing.   Cardiovascular: Positive for leg swelling (right leg swelling after fall). Negative for chest pain and  palpitations.  Gastrointestinal: Negative for abdominal pain.  Neurological: Negative for light-headedness and headaches.       Objective:   Vitals:   02/20/16 0915  BP: (!) 152/92  Pulse: (!) 109  Resp: 16  Temp: 97.6 F (36.4 C)   Filed Weights   02/20/16 0915  Weight: 147 lb (66.7 kg)   Body mass index is 31.81 kg/m.   Physical Exam    Constitutional: Appears well-developed and well-nourished. No distress.  HENT:  Head: Normocephalic and atraumatic.  Neck: left ear canal with excessive cerumen.  Neck supple. No tracheal deviation present. No thyromegaly present.  Cardiovascular: Normal rate, regular rhythm and normal heart sounds.   No murmur heard. No carotid bruit  Pulmonary/Chest: Effort normal and breath sounds normal. No respiratory distress. No has no wheezes. No rales.  Musculoskeletal/vascular: mild non-pitting edema RLE from fall  Lymphadenopathy: No cervical adenopathy.  Skin: Skin is warm and dry. Not diaphoretic.  Psychiatric: Normal mood and affect. Behavior is normal.     Assessment & Plan:    See Problem List for Assessment and Plan of chronic medical problems.

## 2016-02-20 ENCOUNTER — Other Ambulatory Visit (INDEPENDENT_AMBULATORY_CARE_PROVIDER_SITE_OTHER): Payer: Medicare Other

## 2016-02-20 ENCOUNTER — Encounter: Payer: Self-pay | Admitting: Internal Medicine

## 2016-02-20 ENCOUNTER — Ambulatory Visit (INDEPENDENT_AMBULATORY_CARE_PROVIDER_SITE_OTHER): Payer: Medicare Other | Admitting: Internal Medicine

## 2016-02-20 VITALS — BP 152/92 | HR 109 | Temp 97.6°F | Resp 16 | Ht <= 58 in | Wt 147.0 lb

## 2016-02-20 DIAGNOSIS — N183 Chronic kidney disease, stage 3 unspecified: Secondary | ICD-10-CM

## 2016-02-20 DIAGNOSIS — E039 Hypothyroidism, unspecified: Secondary | ICD-10-CM

## 2016-02-20 DIAGNOSIS — I10 Essential (primary) hypertension: Secondary | ICD-10-CM | POA: Diagnosis not present

## 2016-02-20 DIAGNOSIS — Z23 Encounter for immunization: Secondary | ICD-10-CM

## 2016-02-20 DIAGNOSIS — K219 Gastro-esophageal reflux disease without esophagitis: Secondary | ICD-10-CM

## 2016-02-20 LAB — CBC WITH DIFFERENTIAL/PLATELET
BASOS ABS: 0 10*3/uL (ref 0.0–0.1)
Basophils Relative: 0.3 % (ref 0.0–3.0)
Eosinophils Absolute: 0.1 10*3/uL (ref 0.0–0.7)
Eosinophils Relative: 1.2 % (ref 0.0–5.0)
HCT: 35.8 % — ABNORMAL LOW (ref 36.0–46.0)
HEMOGLOBIN: 11.8 g/dL — AB (ref 12.0–15.0)
LYMPHS ABS: 2 10*3/uL (ref 0.7–4.0)
Lymphocytes Relative: 24.1 % (ref 12.0–46.0)
MCHC: 33 g/dL (ref 30.0–36.0)
MCV: 85.7 fl (ref 78.0–100.0)
MONO ABS: 0.6 10*3/uL (ref 0.1–1.0)
MONOS PCT: 6.8 % (ref 3.0–12.0)
NEUTROS PCT: 67.6 % (ref 43.0–77.0)
Neutro Abs: 5.5 10*3/uL (ref 1.4–7.7)
Platelets: 306 10*3/uL (ref 150.0–400.0)
RBC: 4.18 Mil/uL (ref 3.87–5.11)
RDW: 14.9 % (ref 11.5–15.5)
WBC: 8.1 10*3/uL (ref 4.0–10.5)

## 2016-02-20 LAB — LIPID PANEL
CHOL/HDL RATIO: 4
Cholesterol: 217 mg/dL — ABNORMAL HIGH (ref 0–200)
HDL: 57.4 mg/dL (ref >39.00)
LDL Cholesterol: 123 mg/dL — ABNORMAL HIGH (ref 0–99)
NonHDL: 159.21
TRIGLYCERIDES: 182 mg/dL — AB (ref 0.0–149.0)
VLDL: 36.4 mg/dL (ref 0.0–40.0)

## 2016-02-20 LAB — COMPREHENSIVE METABOLIC PANEL
ALBUMIN: 4.1 g/dL (ref 3.5–5.2)
ALK PHOS: 107 U/L (ref 39–117)
ALT: 9 U/L (ref 0–35)
AST: 11 U/L (ref 0–37)
BILIRUBIN TOTAL: 0.4 mg/dL (ref 0.2–1.2)
BUN: 16 mg/dL (ref 6–23)
CO2: 27 mEq/L (ref 19–32)
Calcium: 9.3 mg/dL (ref 8.4–10.5)
Chloride: 104 mEq/L (ref 96–112)
Creatinine, Ser: 1.06 mg/dL (ref 0.40–1.20)
GFR: 52.23 mL/min — AB (ref >60.00)
GLUCOSE: 93 mg/dL (ref 70–99)
Potassium: 4.1 mEq/L (ref 3.5–5.1)
SODIUM: 138 meq/L (ref 135–145)
Total Protein: 7.3 g/dL (ref 6.0–8.3)

## 2016-02-20 LAB — TSH: TSH: 1.86 u[IU]/mL (ref 0.35–4.50)

## 2016-02-20 NOTE — Assessment & Plan Note (Signed)
cmp today  Avoid nsaids

## 2016-02-20 NOTE — Assessment & Plan Note (Signed)
GERD controlled Continue daily medication  

## 2016-02-20 NOTE — Assessment & Plan Note (Signed)
BP slightly high here today No change in meds  - continue current doses  BP Readings from Last 3 Encounters:  02/20/16 (!) 152/92  01/20/16 124/76  01/09/16 (!) 106/37

## 2016-02-20 NOTE — Progress Notes (Signed)
Pre visit review using our clinic review tool, if applicable. No additional management support is needed unless otherwise documented below in the visit note. 

## 2016-02-20 NOTE — Assessment & Plan Note (Signed)
Check tsh  Titrate med dose if needed  

## 2016-02-29 ENCOUNTER — Other Ambulatory Visit: Payer: Self-pay | Admitting: Internal Medicine

## 2016-02-29 NOTE — Telephone Encounter (Signed)
RX was d/c and no longer on pts med list. Please advise.

## 2016-03-02 MED ORDER — LORAZEPAM 0.5 MG PO TABS: ORAL_TABLET | ORAL | 0 refills | Status: DC

## 2016-03-02 NOTE — Telephone Encounter (Signed)
Called in RX, Dr Quay Burow is not back until Monday.

## 2016-03-02 NOTE — Addendum Note (Signed)
Addended by: Terence Lux B on: 03/02/2016 07:58 AM   Modules accepted: Orders

## 2016-03-19 DIAGNOSIS — S52021D Displaced fracture of olecranon process without intraarticular extension of right ulna, subsequent encounter for closed fracture with routine healing: Secondary | ICD-10-CM | POA: Diagnosis not present

## 2016-04-18 DIAGNOSIS — H25011 Cortical age-related cataract, right eye: Secondary | ICD-10-CM | POA: Diagnosis not present

## 2016-04-18 DIAGNOSIS — H25811 Combined forms of age-related cataract, right eye: Secondary | ICD-10-CM | POA: Diagnosis not present

## 2016-04-18 DIAGNOSIS — H2511 Age-related nuclear cataract, right eye: Secondary | ICD-10-CM | POA: Diagnosis not present

## 2016-05-07 ENCOUNTER — Other Ambulatory Visit: Payer: Self-pay | Admitting: Internal Medicine

## 2016-05-18 DIAGNOSIS — D2272 Melanocytic nevi of left lower limb, including hip: Secondary | ICD-10-CM | POA: Diagnosis not present

## 2016-05-18 DIAGNOSIS — L820 Inflamed seborrheic keratosis: Secondary | ICD-10-CM | POA: Diagnosis not present

## 2016-05-18 DIAGNOSIS — Z85828 Personal history of other malignant neoplasm of skin: Secondary | ICD-10-CM | POA: Diagnosis not present

## 2016-05-18 DIAGNOSIS — D1801 Hemangioma of skin and subcutaneous tissue: Secondary | ICD-10-CM | POA: Diagnosis not present

## 2016-05-18 DIAGNOSIS — L814 Other melanin hyperpigmentation: Secondary | ICD-10-CM | POA: Diagnosis not present

## 2016-05-18 DIAGNOSIS — D2271 Melanocytic nevi of right lower limb, including hip: Secondary | ICD-10-CM | POA: Diagnosis not present

## 2016-05-18 DIAGNOSIS — L821 Other seborrheic keratosis: Secondary | ICD-10-CM | POA: Diagnosis not present

## 2016-08-10 ENCOUNTER — Other Ambulatory Visit: Payer: Self-pay | Admitting: Internal Medicine

## 2016-08-19 NOTE — Progress Notes (Signed)
Subjective:    Patient ID: [name], female    DOB: 09/11/1927, 83 y.o.   MRN: [id number]  HPI The patient is here for follow up.  Hypertension: She is taking her medication daily. She is not compliant with a low sodium diet.  She denies chest pain, palpitations, and regular headaches. She is not exercising regularly.  She does monitor her blood pressure at home on occasion - her daughter takes it - it was similar to what it was here today.    Hypothyroidism:  She is taking her medication daily.  She denies any recent changes in energy or weight that are unexplained.   GERD:  She is taking her medication daily as prescribed.  She denies any GERD symptoms and feels her GERD is well controlled.   Anxiety:  She takes 1/2 of an ativan as needed for anxiety.   Leg edema:  She takes lasix 10 mg as needed for leg edema. She takes it twice a week.  She does not take it when she has increased swelling because she always has swelling - she just takes it.  She does not think it helps much - just makes her urinate more.   Medications and allergies reviewed with patient and updated if appropriate.  Patient Active Problem List   Diagnosis Date Noted  . GERD (gastroesophageal reflux disease) 02/20/2016  . Right leg pain 01/20/2016  . Left hip pain 01/20/2016  . Peripheral edema 01/20/2016  . Systolic murmur 54/56/2563  . Olecranon fracture 01/07/2016  . Depression 07/27/2013  . Generalized weakness 07/18/2013  . Anxiety and depression 07/18/2013  . Nodule of left lung 07/18/2013  . Normocytic anemia 07/18/2013  . HYPERTROPHIC CARDIOMYOPATHY 08/23/2009  . ABNORMAL ELECTROCARDIOGRAM 07/18/2009  . Unspecified vitamin D deficiency 01/21/2009  . Chronic kidney disease (CKD), stage III (moderate) 01/21/2009  . History of colonic polyps 01/21/2009  . ANEMIA-NOS 01/21/2009  . MYCOSIS FUNGOIDES 05/21/2008  . DIVERTICULOSIS, COLON 05/21/2008  . OSTEOARTHRITIS 06/02/2007  . Hypothyroidism  04/28/2007  . HYPERLIPIDEMIA 04/28/2007  . METABOLIC SYNDROME X 89/37/3428  . Essential hypertension 04/28/2007  . Osteoporosis 04/28/2007  . FIBROMYALGIA 11/14/2006    Current Outpatient Prescriptions on File Prior to Visit  Medication Sig Dispense Refill  . aspirin 81 MG chewable tablet Chew 81 mg by mouth daily.     . beta carotene w/minerals (OCUVITE) tablet Take 1 tablet by mouth daily.    . cholecalciferol (VITAMIN D) 1000 UNITS tablet Take 2,000 Units by mouth daily.     Marland Kitchen docusate sodium (COLACE) 100 MG capsule Take 1 capsule (100 mg total) by mouth 2 (two) times daily. 60 capsule 0  . furosemide (LASIX) 20 MG tablet Take 1 tablet (20 mg total) by mouth daily as needed. 30 tablet 11  . levothyroxine (SYNTHROID, LEVOTHROID) 75 MCG tablet TAKE 1 TAB BY MOUTH ONCE DAILY WITH EXCEPTION OF TUESDAYS AND SATURDAYS TAKE HALF A TAB AS DIRECTED 72 tablet 2  . lisinopril (PRINIVIL,ZESTRIL) 20 MG tablet TAKE 1 TABLET (20 MG TOTAL) BY MOUTH DAILY. 90 tablet 0  . LORazepam (ATIVAN) 0.5 MG tablet Take 1/2 tablet by mouth daily as needed for anxiety. 10 tablet 0  . nepafenac (ILEVRO) 0.3 % ophthalmic suspension Place 1 drop into the left eye daily.    Marland Kitchen omeprazole (PRILOSEC) 20 MG capsule Take 1 capsule (20 mg total) by mouth daily. 30 capsule 5  . ondansetron (ZOFRAN) 4 MG tablet Take 1 tablet (4 mg total) by mouth  every 8 (eight) hours as needed for nausea or vomiting. 40 tablet 0   No current facility-administered medications on file prior to visit.     Past Medical History:  Diagnosis Date  . Adrenal myelolipoma   . DIVERTICULOSIS, COLON   . Dysmetabolic syndrome X   . FIBROMYALGIA   . Gastric ulcer due to Helicobacter pylori   . Hiatal hernia   . Hx of cataract surgery 01/08/2016  . HYPERLIPIDEMIA   . Hyperplastic colon polyp   . HYPERTENSION, ESSENTIAL NOS   . HYPERTROPHIC CARDIOMYOPATHY   . Hyponatremia   . Hypothyroidism   . MYCOSIS FUNGOIDES   . Nodule of left lung   .  Normocytic anemia   . OSTEOARTHRITIS   . OSTEOPOROSIS   . Other abnormal glucose   . Renal angiomyolipoma   . RENAL INSUFFICIENCY   . Unspecified vitamin D deficiency     Past Surgical History:  Procedure Laterality Date  . colonoscopy with polypectomy  07/25/2007   Dr Olevia Perches, Recheck in 7 years for 2009  . G 4 P 2    . I&D EXTREMITY Right 01/08/2016   Procedure: IRRIGATION AND DEBRIDEMENT OF OLECRANON FRACTURE;  Surgeon: Renette Butters, MD;  Location: [facility];  Service: Orthopedics;  Laterality: Right;  . ORIF ELBOW FRACTURE Right 01/08/2016   Procedure: OPEN REDUCTION INTERNAL FIXATION (ORIF) ELBOW/OLECRANON FRACTURE, NEUROLYSIS;  Surgeon: Renette Butters, MD;  Location: [facility];  Service: Orthopedics;  Laterality: Right;    Social History   Social History  . Marital status: Married    Spouse name: N/A  . Number of children: 2  . Years of education: N/A   Occupational History  . Retired Retired   Social History Main Topics  . Smoking status: Former Smoker    Quit date: 06/05/1963  . Smokeless tobacco: Never Used     Comment: smoked 1951-1965 , up to 1/2 ppd  . Alcohol use No  . Drug use: No  . Sexual activity: Not on file   Other Topics Concern  . Not on file   Social History Narrative   Widowed.  Lives alone.  Ambulates without assistance.  Steady on feet.    Family History  Problem Relation Age of Onset  . Diabetes Mother   . Heart attack Maternal Uncle 29    his 2 sons had MI in 44s  . Hypertension Maternal Grandmother   . Stroke Maternal Grandmother 69  . Cancer Neg Hx   . Asthma Neg Hx   . COPD Neg Hx     Review of Systems  Constitutional: Positive for fatigue. Negative for appetite change, chills and fever.  Respiratory: Positive for shortness of breath (with exertion, not new). Negative for cough and wheezing.   Cardiovascular: Positive for leg swelling (chronic). Negative for chest pain and palpitations.  Neurological: Negative for light-headedness  and headaches.       Objective:   Vitals:   08/20/16 1029  BP: (!) 158/98  Pulse: (!) 119  Resp: 16  Temp: 97.8 F (36.6 C)   Wt Readings from Last 3 Encounters:  08/20/16 160 lb (72.6 kg)  02/20/16 147 lb (66.7 kg)  01/20/16 157 lb (71.2 kg)   Body mass index is 34.62 kg/m.   Physical Exam    Constitutional: Appears well-developed and well-nourished. No distress.  HENT:  Head: Normocephalic and atraumatic.  Neck: Neck supple. No tracheal deviation present. No thyromegaly present.  No cervical lymphadenopathy Cardiovascular: tachycardic, irregular rhythm.  2/6 systolic murmur heard.   2+ edema RLE,> LLE  ( due to an injury) Pulmonary/Chest: Effort normal and breath sounds normal. No respiratory distress. No has no wheezes. No rales.  Skin: Skin is warm and dry. Not diaphoretic.  Psychiatric: Normal mood and affect. Behavior is normal.      Assessment & Plan:    See Problem List for Assessment and Plan of chronic medical problems.

## 2016-08-19 NOTE — Patient Instructions (Addendum)
  Test(s) ordered today. Your results will be released to [facility] (or called to you) after review, usually within 72hours after test completion. If any changes need to be made, you will be notified at that same time.   Medications reviewed and updated.  Changes include starting eliquis (blood thinner).  Stop the aspirin.  Start metoprolol (blood pressure) twice daily.  Start the lasix 10 mg daily.    Your prescription(s) have been submitted to your pharmacy. Please take as directed and contact our office if you believe you are having problem(s) with the medication(s).  A referral was ordered for cardiology.  An Echo was ordered  Please followup this week - Friday

## 2016-08-20 ENCOUNTER — Ambulatory Visit (INDEPENDENT_AMBULATORY_CARE_PROVIDER_SITE_OTHER): Payer: Medicare Other | Admitting: Internal Medicine

## 2016-08-20 ENCOUNTER — Other Ambulatory Visit (INDEPENDENT_AMBULATORY_CARE_PROVIDER_SITE_OTHER): Payer: Medicare Other

## 2016-08-20 ENCOUNTER — Encounter: Payer: Self-pay | Admitting: Internal Medicine

## 2016-08-20 VITALS — BP 158/98 | HR 119 | Temp 97.8°F | Resp 16 | Wt 160.0 lb

## 2016-08-20 DIAGNOSIS — I481 Persistent atrial fibrillation: Secondary | ICD-10-CM

## 2016-08-20 DIAGNOSIS — I422 Other hypertrophic cardiomyopathy: Secondary | ICD-10-CM

## 2016-08-20 DIAGNOSIS — N183 Chronic kidney disease, stage 3 unspecified: Secondary | ICD-10-CM

## 2016-08-20 DIAGNOSIS — I10 Essential (primary) hypertension: Secondary | ICD-10-CM

## 2016-08-20 DIAGNOSIS — R609 Edema, unspecified: Secondary | ICD-10-CM | POA: Diagnosis not present

## 2016-08-20 DIAGNOSIS — K219 Gastro-esophageal reflux disease without esophagitis: Secondary | ICD-10-CM

## 2016-08-20 DIAGNOSIS — E039 Hypothyroidism, unspecified: Secondary | ICD-10-CM

## 2016-08-20 DIAGNOSIS — I4819 Other persistent atrial fibrillation: Secondary | ICD-10-CM | POA: Insufficient documentation

## 2016-08-20 LAB — CBC WITH DIFFERENTIAL/PLATELET
BASOS ABS: 0 10*3/uL (ref 0.0–0.1)
BASOS PCT: 0.8 % (ref 0.0–3.0)
EOS ABS: 0.1 10*3/uL (ref 0.0–0.7)
Eosinophils Relative: 1.7 % (ref 0.0–5.0)
HEMATOCRIT: 33.8 % — AB (ref 36.0–46.0)
Hemoglobin: 11.1 g/dL — ABNORMAL LOW (ref 12.0–15.0)
Lymphocytes Relative: 25.1 % (ref 12.0–46.0)
Lymphs Abs: 1.4 10*3/uL (ref 0.7–4.0)
MCHC: 32.8 g/dL (ref 30.0–36.0)
MCV: 80.8 fl (ref 78.0–100.0)
MONOS PCT: 8 % (ref 3.0–12.0)
Monocytes Absolute: 0.4 10*3/uL (ref 0.1–1.0)
NEUTROS PCT: 64.4 % (ref 43.0–77.0)
Neutro Abs: 3.6 10*3/uL (ref 1.4–7.7)
Platelets: 252 10*3/uL (ref 150.0–400.0)
RBC: 4.18 Mil/uL (ref 3.87–5.11)
RDW: 15.8 % — ABNORMAL HIGH (ref 11.5–15.5)
WBC: 5.6 10*3/uL (ref 4.0–10.5)

## 2016-08-20 LAB — LIPID PANEL
CHOL/HDL RATIO: 4
Cholesterol: 205 mg/dL — ABNORMAL HIGH (ref 0–200)
HDL: 54.4 mg/dL (ref >39.00)
LDL Cholesterol: 123 mg/dL — ABNORMAL HIGH (ref 0–99)
NONHDL: 150.24
Triglycerides: 136 mg/dL (ref 0.0–149.0)
VLDL: 27.2 mg/dL (ref 0.0–40.0)

## 2016-08-20 LAB — COMPREHENSIVE METABOLIC PANEL
ALT: 7 U/L (ref 0–35)
AST: 13 U/L (ref 0–37)
Albumin: 4 g/dL (ref 3.5–5.2)
Alkaline Phosphatase: 87 U/L (ref 39–117)
BILIRUBIN TOTAL: 0.4 mg/dL (ref 0.2–1.2)
BUN: 20 mg/dL (ref 6–23)
CALCIUM: 9.2 mg/dL (ref 8.4–10.5)
CO2: 24 meq/L (ref 19–32)
CREATININE: 1.07 mg/dL (ref 0.40–1.20)
Chloride: 109 mEq/L (ref 96–112)
GFR: 51.61 mL/min — ABNORMAL LOW (ref >60.00)
GLUCOSE: 95 mg/dL (ref 70–99)
Potassium: 4 mEq/L (ref 3.5–5.1)
Sodium: 142 mEq/L (ref 135–145)
Total Protein: 6.6 g/dL (ref 6.0–8.3)

## 2016-08-20 LAB — TSH: TSH: 2.38 u[IU]/mL (ref 0.35–4.50)

## 2016-08-20 MED ORDER — FUROSEMIDE 20 MG PO TABS: 10.0000 mg | ORAL_TABLET | Freq: Every day | ORAL | 11 refills | Status: DC

## 2016-08-20 MED ORDER — OMEPRAZOLE 20 MG PO CPDR: 20.0000 mg | DELAYED_RELEASE_CAPSULE | Freq: Every day | ORAL | 3 refills | Status: DC

## 2016-08-20 MED ORDER — APIXABAN 2.5 MG PO TABS: 2.5000 mg | ORAL_TABLET | Freq: Two times a day (BID) | ORAL | 5 refills | Status: DC

## 2016-08-20 MED ORDER — METOPROLOL TARTRATE 25 MG PO TABS: 25.0000 mg | ORAL_TABLET | Freq: Two times a day (BID) | ORAL | 3 refills | Status: DC

## 2016-08-20 NOTE — Assessment & Plan Note (Signed)
Check tsh  Titrate med dose if needed  

## 2016-08-20 NOTE — Assessment & Plan Note (Addendum)
New onset Afib EKG today shows Afib at 117 bpm, possible T wave inverions anteriorly She is having some SOB which she states is chronic and unchanged, she denies palpitations Fluid overloaded Start lasix 10 mg daily ( now taking it 2/week) Start metoprolol 25 mg twice daily Start eliquis 2.5 mg twice daily Order Echo Refer to cardiology Blood work today Follow up with me on Friday or cardiology

## 2016-08-20 NOTE — Assessment & Plan Note (Signed)
Saw cardiology 2011 - has apical hypertrophic cardiomyopathy Echo ordered Referred to cardiology

## 2016-08-20 NOTE — Assessment & Plan Note (Signed)
GERD controlled Continue daily medication  

## 2016-08-20 NOTE — Progress Notes (Signed)
Pre visit review using our clinic review tool, if applicable. No additional management support is needed unless otherwise documented below in the visit note. 

## 2016-08-20 NOTE — Assessment & Plan Note (Signed)
Not controlled here or at home, also new onset afib Continue lisinopril 20 mg daily Start metoprolol 25 mg twice daily May need to decrease lisinopril Start lasix 10 mg daily given fluid overload cmp, cbc, tsh, lipid

## 2016-08-20 NOTE — Assessment & Plan Note (Signed)
Taking 10 mg twice daily of lasix Will increase to 10 mg daily - may need to titrate  cmp today and at end of week

## 2016-08-20 NOTE — Assessment & Plan Note (Addendum)
cmp today Fluid overloaded Increasing lasix to 10 mg daily

## 2016-08-23 NOTE — Progress Notes (Signed)
Subjective:    Patient ID: [name], female    DOB: 09-09-1927, 83 y.o.   MRN: [id number]  HPI The patient is here for follow up.  She was here 4 days ago and was in AFib.  I started her on metoprolol 25 mg twice daily, eliquis 2.5 mg twice daily and asked her to take her lasix daily.  An echo, cardiology referral was ordered.  She has an appointment with cardiology 4/2.   She has been taking the lasix daily.  She is down three pounds.  She feels it is drying her out too much.    She denies chest pain, palpitations and SOB above and beyond her chronic SOB with exertion.  She was a little tired and times, but feels this was within normal limits.  She has had some blotches of redness on her arms, but it is getting better.  She denies this elsewhere.  It does not hurt or itch.   She brought an EKG here today from last Fall from when she had eye surgery.  She was in Afib at that time.  She was supposed to bring it to me and follow up with me but did not.  Copy to be scanned into chart.   Medications and allergies reviewed with patient and updated if appropriate.  Patient Active Problem List   Diagnosis Date Noted  . Persistent atrial fibrillation ([place]) 08/20/2016  . GERD (gastroesophageal reflux disease) 02/20/2016  . Right leg pain 01/20/2016  . Left hip pain 01/20/2016  . Peripheral edema 01/20/2016  . Systolic murmur 93/57/0177  . Olecranon fracture 01/07/2016  . Depression 07/27/2013  . Generalized weakness 07/18/2013  . Anxiety and depression 07/18/2013  . Nodule of left lung 07/18/2013  . Normocytic anemia 07/18/2013  . Apical variant hypertrophic cardiomyopathy ([place]) 08/23/2009  . ABNORMAL ELECTROCARDIOGRAM 07/18/2009  . Unspecified vitamin D deficiency 01/21/2009  . Chronic kidney disease (CKD), stage III (moderate) 01/21/2009  . History of colonic polyps 01/21/2009  . ANEMIA-NOS 01/21/2009  . MYCOSIS FUNGOIDES 05/21/2008  . DIVERTICULOSIS, COLON 05/21/2008  .  OSTEOARTHRITIS 06/02/2007  . Hypothyroidism 04/28/2007  . HYPERLIPIDEMIA 04/28/2007  . METABOLIC SYNDROME X 93/90/3009  . Essential hypertension 04/28/2007  . Osteoporosis 04/28/2007  . FIBROMYALGIA 11/14/2006    Current Outpatient Prescriptions on File Prior to Visit  Medication Sig Dispense Refill  . apixaban (ELIQUIS) 2.5 MG TABS tablet Take 1 tablet (2.5 mg total) by mouth 2 (two) times daily. 60 tablet 5  . beta carotene w/minerals (OCUVITE) tablet Take 1 tablet by mouth daily.    . cholecalciferol (VITAMIN D) 1000 UNITS tablet Take 2,000 Units by mouth daily.     Marland Kitchen docusate sodium (COLACE) 100 MG capsule Take 1 capsule (100 mg total) by mouth 2 (two) times daily. 60 capsule 0  . furosemide (LASIX) 20 MG tablet Take 0.5 tablets (10 mg total) by mouth daily. 30 tablet 11  . levothyroxine (SYNTHROID, LEVOTHROID) 75 MCG tablet TAKE 1 TAB BY MOUTH ONCE DAILY WITH EXCEPTION OF TUESDAYS AND SATURDAYS TAKE HALF A TAB AS DIRECTED 72 tablet 2  . lisinopril (PRINIVIL,ZESTRIL) 20 MG tablet TAKE 1 TABLET (20 MG TOTAL) BY MOUTH DAILY. 90 tablet 0  . LORazepam (ATIVAN) 0.5 MG tablet Take 1/2 tablet by mouth daily as needed for anxiety. 10 tablet 0  . nepafenac (ILEVRO) 0.3 % ophthalmic suspension Place 1 drop into the left eye daily.    Marland Kitchen omeprazole (PRILOSEC) 20 MG capsule Take 1 capsule (20  mg total) by mouth daily. 90 capsule 3  . ondansetron (ZOFRAN) 4 MG tablet Take 1 tablet (4 mg total) by mouth every 8 (eight) hours as needed for nausea or vomiting. 40 tablet 0   No current facility-administered medications on file prior to visit.     Past Medical History:  Diagnosis Date  . Adrenal myelolipoma   . DIVERTICULOSIS, COLON   . Dysmetabolic syndrome X   . FIBROMYALGIA   . Gastric ulcer due to Helicobacter pylori   . Hiatal hernia   . Hx of cataract surgery 01/08/2016  . HYPERLIPIDEMIA   . Hyperplastic colon polyp   . HYPERTENSION, ESSENTIAL NOS   . HYPERTROPHIC CARDIOMYOPATHY   .  Hyponatremia   . Hypothyroidism   . MYCOSIS FUNGOIDES   . Nodule of left lung   . Normocytic anemia   . OSTEOARTHRITIS   . OSTEOPOROSIS   . Other abnormal glucose   . Renal angiomyolipoma   . RENAL INSUFFICIENCY   . Unspecified vitamin D deficiency     Past Surgical History:  Procedure Laterality Date  . colonoscopy with polypectomy  07/25/2007   Dr Olevia Perches, Recheck in 7 years for 2009  . G 4 P 2    . I&D EXTREMITY Right 01/08/2016   Procedure: IRRIGATION AND DEBRIDEMENT OF OLECRANON FRACTURE;  Surgeon: Renette Butters, MD;  Location: [facility];  Service: Orthopedics;  Laterality: Right;  . ORIF ELBOW FRACTURE Right 01/08/2016   Procedure: OPEN REDUCTION INTERNAL FIXATION (ORIF) ELBOW/OLECRANON FRACTURE, NEUROLYSIS;  Surgeon: Renette Butters, MD;  Location: [facility];  Service: Orthopedics;  Laterality: Right;    Social History   Social History  . Marital status: Married    Spouse name: N/A  . Number of children: 2  . Years of education: N/A   Occupational History  . Retired Retired   Social History Main Topics  . Smoking status: Former Smoker    Quit date: 06/05/1963  . Smokeless tobacco: Never Used     Comment: smoked 1951-1965 , up to 1/2 ppd  . Alcohol use No  . Drug use: No  . Sexual activity: Not Asked   Other Topics Concern  . None   Social History Narrative   Widowed.  Lives alone.  Ambulates without assistance.  Steady on feet.    Family History  Problem Relation Age of Onset  . Diabetes Mother   . Heart attack Maternal Uncle 48    his 2 sons had MI in 11s  . Hypertension Maternal Grandmother   . Stroke Maternal Grandmother 69  . Cancer Neg Hx   . Asthma Neg Hx   . COPD Neg Hx     Review of Systems  Constitutional: Negative for fatigue and fever.  Respiratory: Positive for shortness of breath (chronic with moderate activity). Negative for cough and wheezing.   Cardiovascular: Positive for leg swelling. Negative for chest pain and palpitations.  Skin:  Positive for color change (patches of redness on b/l arms).  Neurological: Negative for light-headedness and headaches.       Objective:   Vitals:   08/24/16 0958  BP: (!) 154/96  Pulse: (!) 116  Resp: 16  Temp: 98.2 F (36.8 C)   Wt Readings from Last 3 Encounters:  08/24/16 157 lb (71.2 kg)  08/20/16 160 lb (72.6 kg)  02/20/16 147 lb (66.7 kg)   Body mass index is 33.97 kg/m.   Physical Exam    Constitutional: Appears well-developed and well-nourished. No distress.  HENT:  Head: Normocephalic and atraumatic.  Neck: Neck supple. No tracheal deviation present. No thyromegaly present.  No cervical lymphadenopathy Cardiovascular: tachycardic, irregular rhythm and normal heart sounds.   No murmur heard. No carotid bruit .  1+ pitting edema - improved since monday Pulmonary/Chest: Effort normal and breath sounds normal. No respiratory distress. No has no wheezes. No rales.  Skin: Skin is warm and dry. Not diaphoretic. mild erythema on b/l arms in patches - does not appear vascular or like hives Psychiatric: Normal mood and affect. Behavior is normal.      Assessment & Plan:    See Problem List for Assessment and Plan of chronic medical problems.

## 2016-08-24 ENCOUNTER — Ambulatory Visit (INDEPENDENT_AMBULATORY_CARE_PROVIDER_SITE_OTHER): Payer: Medicare Other | Admitting: Internal Medicine

## 2016-08-24 ENCOUNTER — Encounter: Payer: Self-pay | Admitting: Internal Medicine

## 2016-08-24 VITALS — BP 154/96 | HR 116 | Temp 98.2°F | Resp 16 | Wt 157.0 lb

## 2016-08-24 DIAGNOSIS — I10 Essential (primary) hypertension: Secondary | ICD-10-CM | POA: Diagnosis not present

## 2016-08-24 DIAGNOSIS — I4819 Other persistent atrial fibrillation: Secondary | ICD-10-CM

## 2016-08-24 DIAGNOSIS — I481 Persistent atrial fibrillation: Secondary | ICD-10-CM

## 2016-08-24 DIAGNOSIS — I4891 Unspecified atrial fibrillation: Secondary | ICD-10-CM

## 2016-08-24 DIAGNOSIS — R609 Edema, unspecified: Secondary | ICD-10-CM | POA: Diagnosis not present

## 2016-08-24 MED ORDER — METOPROLOL TARTRATE 50 MG PO TABS: 50.0000 mg | ORAL_TABLET | Freq: Two times a day (BID) | ORAL | 3 refills | Status: DC

## 2016-08-24 MED ORDER — FUROSEMIDE 20 MG PO TABS: ORAL_TABLET | ORAL | 3 refills | Status: DC

## 2016-08-24 NOTE — Progress Notes (Signed)
Pre visit review using our clinic review tool, if applicable. No additional management support is needed unless otherwise documented below in the visit note. 

## 2016-08-24 NOTE — Patient Instructions (Addendum)
Decrease lasix to three times a week, you can take an extra lasix if needed.   Increase metoprolol to 50 mg twice daily.   Continue your other medications.    Call or send a message via mychart next week.

## 2016-08-24 NOTE — Assessment & Plan Note (Signed)
Elevated - increase metoprolol to 50 mg twice daily Continue other meds Monitor at home  Call / email next week with an update

## 2016-08-24 NOTE — Assessment & Plan Note (Addendum)
On Monday I thought this was new onset, but she brought in an EKG from November when she had eye surgery and she was in Afib - not sure if it is paroxsymal or persistent EKG today Afib at 116 bpm Still in Afib and tachycardic, but asymptomatic for the most part Concern for underlying ischemia Increase metoprolol to 50 mg twice daily Continue eliquis  - monitor for bleeding Will update me next week Sees cardiology 4/2

## 2016-08-24 NOTE — Assessment & Plan Note (Signed)
Has lost 3 lbs in the past few days with increased lasix Decrease lasix to 3 times a week and advised her to take a 4 th time if edema increases

## 2016-08-29 ENCOUNTER — Encounter: Payer: Self-pay | Admitting: Internal Medicine

## 2016-08-30 ENCOUNTER — Telehealth: Payer: Self-pay | Admitting: Internal Medicine

## 2016-08-30 NOTE — Telephone Encounter (Signed)
Spoke with patient regarding awv. Patient stated that she will contact office when she is ready to schedule awv.

## 2016-09-04 ENCOUNTER — Other Ambulatory Visit: Payer: Self-pay

## 2016-09-04 ENCOUNTER — Ambulatory Visit (HOSPITAL_COMMUNITY): Payer: Medicare Other | Attending: Cardiology

## 2016-09-04 DIAGNOSIS — I481 Persistent atrial fibrillation: Secondary | ICD-10-CM | POA: Insufficient documentation

## 2016-09-04 DIAGNOSIS — I081 Rheumatic disorders of both mitral and tricuspid valves: Secondary | ICD-10-CM | POA: Insufficient documentation

## 2016-09-04 DIAGNOSIS — R600 Localized edema: Secondary | ICD-10-CM | POA: Diagnosis not present

## 2016-09-04 DIAGNOSIS — E039 Hypothyroidism, unspecified: Secondary | ICD-10-CM | POA: Diagnosis not present

## 2016-09-04 DIAGNOSIS — E785 Hyperlipidemia, unspecified: Secondary | ICD-10-CM | POA: Insufficient documentation

## 2016-09-04 DIAGNOSIS — I429 Cardiomyopathy, unspecified: Secondary | ICD-10-CM | POA: Diagnosis not present

## 2016-09-04 DIAGNOSIS — R011 Cardiac murmur, unspecified: Secondary | ICD-10-CM | POA: Insufficient documentation

## 2016-09-04 DIAGNOSIS — I10 Essential (primary) hypertension: Secondary | ICD-10-CM | POA: Insufficient documentation

## 2016-09-04 DIAGNOSIS — I422 Other hypertrophic cardiomyopathy: Secondary | ICD-10-CM | POA: Diagnosis not present

## 2016-09-04 DIAGNOSIS — I4819 Other persistent atrial fibrillation: Secondary | ICD-10-CM

## 2016-09-04 MED ORDER — PERFLUTREN LIPID MICROSPHERE
1.0000 mL | INTRAVENOUS | Status: AC | PRN
  Administered 2016-09-04: 3 mL via INTRAVENOUS

## 2016-09-05 ENCOUNTER — Telehealth: Payer: Self-pay | Admitting: Internal Medicine

## 2016-09-05 MED ORDER — METOPROLOL TARTRATE 50 MG PO TABS: 25.0000 mg | ORAL_TABLET | Freq: Two times a day (BID) | ORAL | 3 refills | Status: DC

## 2016-09-05 NOTE — Telephone Encounter (Signed)
What has her heart rate been?  Ok to decrease metoprolol to 25 mg twice daily. (changed med list)  Her echo shows normal heart function, still in afib when she had the echo, she had two leaky valves - one was mild, one was moderate.    Does she have an appointment with a cardiologist?

## 2016-09-05 NOTE — Telephone Encounter (Signed)
Spoke with pt, she states she feel the metoprolol may be too strong, she felt weak and hot on Sunday. She took 1/2 tablet twice on Saturday and felt much better, her BP has been running 115/70 to 136/79. Please advise.

## 2016-09-05 NOTE — Telephone Encounter (Signed)
Pt called and would like a nurse to call her.  She has some questions about her meds.

## 2016-09-06 NOTE — Telephone Encounter (Signed)
Spoke with pt and daughter to inform. Pts daughter is going to call and schedule appt with cardiology bc they have not received a call yet. Pts heart rate has been running in the 60-70 range.

## 2016-09-06 NOTE — Telephone Encounter (Signed)
Ok good - ok to decrease metoprolol to 25 mg twice daily

## 2016-09-07 ENCOUNTER — Encounter: Payer: Self-pay | Admitting: Internal Medicine

## 2016-09-08 NOTE — Progress Notes (Signed)
New Outpatient Visit Date: 09/10/2016  Referring Provider: Binnie Rail, MD [place], [place] [address]  Chief Complaint: Atrial fibrillation  HPI:  [name] is a 83 y.o. female who is being seen today for the evaluation of atrial fibrillation at the request of Burns, Claudina Lick, MD. she has a history of recently diagnosed atrial fibrillation, hypertrophic cardiomyopathy, hypertension, hyperlipidemia, hypothyroidism, and chronic kidney disease. Patient reports at least one year of low energy and shortness of breath. She notes that this worsened recently with addition of metoprolol due to atrial fibrillation with rapid ventricular response. However, with de-escalation of metoprolol, dyspnea has returned back to her baseline. Atrial fibrillation was first diagnosed by her PCP last month. However, the patient was told about a rapid heartbeat at the time of ice surgery in 04/2016. She denies palpitations and chest pain but has experienced progressive exertional dyspnea over the last 2 years she has intermittent lower extremity edema for which she uses furosemide at least 2-3 days a week. She has gained several pounds over the winter, which she attributes to inactivity rather than fluid retention. She has not been lighthead or dizzy, though she lost her balance and fell in August, leading to an elbow injury. She denies significant bleeding, having been started on apixaban 2.5 mg twice a day by her PCP last month. She denies orthopnea, PND, and claudication.  The patient leaves she may have had a stress test several years ago. She also notes having heard about thickened left ventricle on prior echocardiogram. She was last seen in our office by Dr. Aundra Dubin in 2011 due to abnormal EKG.  --------------------------------------------------------------------------------------------------  Cardiovascular History & Procedures: Cardiovascular Problems:  Persistent atrial fibtillation  Apical  HCM  Risk Factors:  HTN, hyperlipidemia, and age > 20  Cath/PCI:  None  CV Surgery:  None  EP Procedures and Devices:  None  Non-Invasive Evaluation(s):  TTE (09/04/16): Normal LV size with marked apical hypertrophy. LVEF 60-65%. No WMA. Mild MR. Moderate left atrial enlargement. Normal RV size and function. Mild right atrial enlargement.  Recent CV Pertinent Labs: Lab Results  Component Value Date   CHOL 205 (H) 08/20/2016   HDL 54.40 08/20/2016   LDLCALC 123 (H) 08/20/2016   LDLDIRECT 128.9 09/09/2012   TRIG 136.0 08/20/2016   CHOLHDL 4 08/20/2016   K 4.0 08/20/2016   BUN 20 08/20/2016   CREATININE 1.07 08/20/2016    --------------------------------------------------------------------------------------------------  Past Medical History:  Diagnosis Date  . Adrenal myelolipoma   . DIVERTICULOSIS, COLON   . Dysmetabolic syndrome X   . FIBROMYALGIA   . Gastric ulcer due to Helicobacter pylori   . Hiatal hernia   . Hx of cataract surgery 01/08/2016  . HYPERLIPIDEMIA   . Hyperplastic colon polyp   . HYPERTENSION, ESSENTIAL NOS   . HYPERTROPHIC CARDIOMYOPATHY   . Hyponatremia   . Hypothyroidism   . MYCOSIS FUNGOIDES   . Nodule of left lung   . Normocytic anemia   . OSTEOARTHRITIS   . OSTEOPOROSIS   . Other abnormal glucose   . Renal angiomyolipoma   . RENAL INSUFFICIENCY   . Unspecified vitamin D deficiency     Past Surgical History:  Procedure Laterality Date  . colonoscopy with polypectomy  07/25/2007   Dr Olevia Perches, Recheck in 7 years for 2009  . G 4 P 2    . I&D EXTREMITY Right 01/08/2016   Procedure: IRRIGATION AND DEBRIDEMENT OF OLECRANON FRACTURE;  Surgeon: Renette Butters, MD;  Location: [facility];  Service: Orthopedics;  Laterality: Right;  . ORIF ELBOW FRACTURE Right 01/08/2016   Procedure: OPEN REDUCTION INTERNAL FIXATION (ORIF) ELBOW/OLECRANON FRACTURE, NEUROLYSIS;  Surgeon: Renette Butters, MD;  Location: [facility];  Service: Orthopedics;  Laterality:  Right;    Outpatient Encounter Prescriptions as of 09/10/2016  Medication Sig  . apixaban (ELIQUIS) 2.5 MG TABS tablet Take 1 tablet (2.5 mg total) by mouth 2 (two) times daily.  . beta carotene w/minerals (OCUVITE) tablet Take 1 tablet by mouth daily.  . cholecalciferol (VITAMIN D) 1000 UNITS tablet Take 2,000 Units by mouth daily.   Marland Kitchen docusate sodium (COLACE) 100 MG capsule Take 1 capsule (100 mg total) by mouth 2 (two) times daily.  . furosemide (LASIX) 20 MG tablet Take 10 mg three times a week, take an additional dose if needed  . levothyroxine (SYNTHROID, LEVOTHROID) 75 MCG tablet TAKE 1 TAB BY MOUTH ONCE DAILY WITH EXCEPTION OF TUESDAYS AND SATURDAYS TAKE HALF A TAB AS DIRECTED  . lisinopril (PRINIVIL,ZESTRIL) 20 MG tablet TAKE 1 TABLET (20 MG TOTAL) BY MOUTH DAILY.  Marland Kitchen LORazepam (ATIVAN) 0.5 MG tablet Take 1/2 tablet by mouth daily as needed for anxiety.  . metoprolol (LOPRESSOR) 50 MG tablet Take 0.5 tablets (25 mg total) by mouth 2 (two) times daily.  Marland Kitchen omeprazole (PRILOSEC) 20 MG capsule Take 1 capsule (20 mg total) by mouth daily.  . [DISCONTINUED] nepafenac (ILEVRO) 0.3 % ophthalmic suspension Place 1 drop into the left eye daily.  . [DISCONTINUED] ondansetron (ZOFRAN) 4 MG tablet Take 1 tablet (4 mg total) by mouth every 8 (eight) hours as needed for nausea or vomiting.   No facility-administered encounter medications on file as of 09/10/2016.     Allergies: Achromycin [tetracycline hcl]; Penicillins; Simvastatin; Celecoxib; Clarithromycin; Ezetimibe-simvastatin; Flagyl [metronidazole]; and Abilify [aripiprazole]  Social History   Social History  . Marital status: Married    Spouse name: N/A  . Number of children: 2  . Years of education: N/A   Occupational History  . Retired Retired   Social History Main Topics  . Smoking status: Former Smoker    Quit date: 06/05/1963  . Smokeless tobacco: Never Used     Comment: smoked 1951-1965 , up to 1/2 ppd  . Alcohol use No  .  Drug use: No  . Sexual activity: Not on file   Other Topics Concern  . Not on file   Social History Narrative   Widowed.  Lives alone.  Ambulates without assistance.  Steady on feet.    Family History  Problem Relation Age of Onset  . Diabetes Mother   . Heart attack Maternal Uncle 63    his 2 sons had MI in 86s  . Hypertension Maternal Grandmother   . Stroke Maternal Grandmother 69  . Cancer Neg Hx   . Asthma Neg Hx   . COPD Neg Hx     Review of Systems: A 12-system review of systems was performed and was negative except as noted in the HPI.  --------------------------------------------------------------------------------------------------  Physical Exam: BP 140/80   Pulse (!) 104   Ht 4\' 9"  (1.448 m)   Wt 158 lb 6.4 oz (71.8 kg)   SpO2 98%   BMI 34.28 kg/m   General:  Obese woman, seated comfortably in the exam room. She is accompanied by her daughter. HEENT: No conjunctival pallor or scleral icterus.  Moist mucous membranes.  OP clear. Neck: Supple without lymphadenopathy, thyromegaly, JVD, or HJR.  No carotid bruit. Lungs: Normal  work of breathing.  Clear to auscultation bilaterally without wheezes or crackles. Heart: Irregularly irregular rhythm without murmurs, rubs, or gallops. Unable to assess PMI due to body habitus. Abd: Bowel sounds present.  Soft, NT/ND without hepatosplenomegaly Ext: Trivial ankle edema.  Radial, PT, and DP pulses are 2+ bilaterally Skin: warm and dry without rash Neuro: CNIII-XII intact.  Strength and fine-touch sensation intact in upper and lower extremities bilaterally. Psych: Normal mood and affect.  EKG:  Atrial fibrillation with anterolateral T-wave inversions. Heart rate is better controlled than on prior tracing from 08/20/16. Otherwise, there has been no significant interval change (I have personally reviewed both tracings).  Lab Results  Component Value Date   WBC 5.6 08/20/2016   HGB 11.1 (L) 08/20/2016   HCT 33.8 (L)  08/20/2016   MCV 80.8 08/20/2016   PLT 252.0 08/20/2016    Lab Results  Component Value Date   NA 142 08/20/2016   K 4.0 08/20/2016   CL 109 08/20/2016   CO2 24 08/20/2016   BUN 20 08/20/2016   CREATININE 1.07 08/20/2016   GLUCOSE 95 08/20/2016   ALT 7 08/20/2016    Lab Results  Component Value Date   CHOL 205 (H) 08/20/2016   HDL 54.40 08/20/2016   LDLCALC 123 (H) 08/20/2016   LDLDIRECT 128.9 09/09/2012   TRIG 136.0 08/20/2016   CHOLHDL 4 08/20/2016   Lab Results  Component Value Date   TSH 2.38 08/20/2016    --------------------------------------------------------------------------------------------------  ASSESSMENT AND PLAN: Persistent atrial fibrillation Patient remains in atrial fibrillation today with relatively good rate control (91 bpm by EKG). She is asymptomatic with the exception of exertional dyspnea and fatigue that have been present for at least a few years. Of note, EKG from 04/2016 demonstrates atrial fibrillation, though patient reports that diagnosis was first made last month. She notes that her dyspnea and fatigue were worsened with addition of metoprolol. We discussed switching to an alternative beta blocker to see if this helps her symptoms; we will switch to carvedilol 6.25 mg twice a day. Ultimately, she may benefit from restoration of sinus rhythm, given her apical hypertrophy and diastolic dysfunction. She very well may need an antiarrhythmic to maintain sinus rhythm given her structural heart abnormalities. I would favor using amiodarone, given her age and other comorbidities. Her current apixaban dosing is subtherapeutic, given her weight and renal function. We will increase apixaban to 5 mg twice a day and have her return in one month to reevaluate her symptoms. At that time, we could consider addition of amiodarone and/or direct-current cardioversion, as she will have been adequately anticoagulated for a month.  Apical hypertrophic  cardiomyopathy Patient appears relatively euvolemic and well compensated today. As above, we will switch metoprolol to carvedilol. Patient to continue Lasix as needed for edema. T-wave in inversions on EKG has been previously noted and are most likely related to her apical hypertrophy.  Follow-up: Return to clinic in one month.  Nelva Bush, MD 09/11/16 11:22 AM

## 2016-09-10 ENCOUNTER — Ambulatory Visit (INDEPENDENT_AMBULATORY_CARE_PROVIDER_SITE_OTHER): Payer: Medicare Other | Admitting: Internal Medicine

## 2016-09-10 ENCOUNTER — Encounter: Payer: Self-pay | Admitting: Internal Medicine

## 2016-09-10 VITALS — BP 140/80 | HR 104 | Ht <= 58 in | Wt 158.4 lb

## 2016-09-10 DIAGNOSIS — I481 Persistent atrial fibrillation: Secondary | ICD-10-CM

## 2016-09-10 DIAGNOSIS — I4819 Other persistent atrial fibrillation: Secondary | ICD-10-CM

## 2016-09-10 DIAGNOSIS — I422 Other hypertrophic cardiomyopathy: Secondary | ICD-10-CM | POA: Diagnosis not present

## 2016-09-10 MED ORDER — CARVEDILOL 6.25 MG PO TABS: 6.2500 mg | ORAL_TABLET | Freq: Two times a day (BID) | ORAL | 6 refills | Status: DC

## 2016-09-10 MED ORDER — APIXABAN 5 MG PO TABS: 5.0000 mg | ORAL_TABLET | Freq: Two times a day (BID) | ORAL | 6 refills | Status: DC

## 2016-09-10 NOTE — Patient Instructions (Signed)
Medication Instructions:  Stop metoprolol.  Start coreg (carvedilol) 6.25mg  two times a day.   Increase Eliquis to 5mg  two times a day. You can take 2 of your 2.5mg  tablets two times a day and use your current supply.  Labwork: none  Testing/Procedures: none  Follow-Up: Your physician recommends that you schedule a follow-up appointment in: 1 month with Dr End.        If you need a refill on your cardiac medications before your next appointment, please call your pharmacy.

## 2016-10-06 ENCOUNTER — Other Ambulatory Visit: Payer: Self-pay | Admitting: Internal Medicine

## 2016-10-08 DIAGNOSIS — H353110 Nonexudative age-related macular degeneration, right eye, stage unspecified: Secondary | ICD-10-CM | POA: Diagnosis not present

## 2016-10-08 DIAGNOSIS — H353221 Exudative age-related macular degeneration, left eye, with active choroidal neovascularization: Secondary | ICD-10-CM | POA: Diagnosis not present

## 2016-10-08 DIAGNOSIS — H3562 Retinal hemorrhage, left eye: Secondary | ICD-10-CM | POA: Diagnosis not present

## 2016-10-08 NOTE — Telephone Encounter (Signed)
RX faxed to [facility] 

## 2016-10-09 DIAGNOSIS — H3562 Retinal hemorrhage, left eye: Secondary | ICD-10-CM | POA: Diagnosis not present

## 2016-10-09 DIAGNOSIS — H353132 Nonexudative age-related macular degeneration, bilateral, intermediate dry stage: Secondary | ICD-10-CM | POA: Diagnosis not present

## 2016-10-09 DIAGNOSIS — H35012 Changes in retinal vascular appearance, left eye: Secondary | ICD-10-CM | POA: Diagnosis not present

## 2016-10-09 DIAGNOSIS — H43811 Vitreous degeneration, right eye: Secondary | ICD-10-CM | POA: Diagnosis not present

## 2016-10-15 ENCOUNTER — Encounter: Payer: Self-pay | Admitting: Internal Medicine

## 2016-10-15 ENCOUNTER — Ambulatory Visit (INDEPENDENT_AMBULATORY_CARE_PROVIDER_SITE_OTHER): Payer: Medicare Other | Admitting: Internal Medicine

## 2016-10-15 VITALS — BP 124/70 | HR 113 | Ht <= 58 in | Wt 154.0 lb

## 2016-10-15 DIAGNOSIS — H353132 Nonexudative age-related macular degeneration, bilateral, intermediate dry stage: Secondary | ICD-10-CM | POA: Diagnosis not present

## 2016-10-15 DIAGNOSIS — I422 Other hypertrophic cardiomyopathy: Secondary | ICD-10-CM | POA: Diagnosis not present

## 2016-10-15 DIAGNOSIS — I10 Essential (primary) hypertension: Secondary | ICD-10-CM | POA: Diagnosis not present

## 2016-10-15 DIAGNOSIS — H43811 Vitreous degeneration, right eye: Secondary | ICD-10-CM | POA: Diagnosis not present

## 2016-10-15 DIAGNOSIS — I4819 Other persistent atrial fibrillation: Secondary | ICD-10-CM

## 2016-10-15 DIAGNOSIS — H3562 Retinal hemorrhage, left eye: Secondary | ICD-10-CM | POA: Diagnosis not present

## 2016-10-15 DIAGNOSIS — H35012 Changes in retinal vascular appearance, left eye: Secondary | ICD-10-CM | POA: Diagnosis not present

## 2016-10-15 DIAGNOSIS — I481 Persistent atrial fibrillation: Secondary | ICD-10-CM

## 2016-10-15 DIAGNOSIS — H3509 Other intraretinal microvascular abnormalities: Secondary | ICD-10-CM | POA: Diagnosis not present

## 2016-10-15 LAB — BASIC METABOLIC PANEL
BUN / CREAT RATIO: 15 (ref 12–28)
BUN: 17 mg/dL (ref 8–27)
CHLORIDE: 102 mmol/L (ref 96–106)
CO2: 22 mmol/L (ref 18–29)
Calcium: 9.2 mg/dL (ref 8.7–10.3)
Creatinine, Ser: 1.17 mg/dL — ABNORMAL HIGH (ref 0.57–1.00)
GFR calc non Af Amer: 42 mL/min/{1.73_m2} — ABNORMAL LOW (ref >59)
GFR, EST AFRICAN AMERICAN: 49 mL/min/{1.73_m2} — AB (ref >59)
Glucose: 99 mg/dL (ref 65–99)
POTASSIUM: 4.3 mmol/L (ref 3.5–5.2)
SODIUM: 140 mmol/L (ref 134–144)

## 2016-10-15 MED ORDER — CARVEDILOL 12.5 MG PO TABS: 12.5000 mg | ORAL_TABLET | Freq: Two times a day (BID) | ORAL | 1 refills | Status: DC

## 2016-10-15 MED ORDER — FUROSEMIDE 20 MG PO TABS: 20.0000 mg | ORAL_TABLET | Freq: Every day | ORAL | 1 refills | Status: DC

## 2016-10-15 NOTE — Patient Instructions (Signed)
Medication Instructions:  Increase lasix (furosemide) to 20 mg daily.  Increase coreg (carvedilol) to 12.5 mg two times a day. You can take 2 of your 6.25mg  tablets two times a day and use your current supply.  Labwork: Your physician recommends that you have lab today--BMET.   Your physician recommends that you return for lab work in: about 2 weeks-BMET   Testing/Procedures: None   Follow-Up: Your physician recommends that you schedule a follow-up appointment in: 1 month with Dr End.        If you need a refill on your cardiac medications before your next appointment, please call your pharmacy.

## 2016-10-15 NOTE — Progress Notes (Signed)
Follow-up Outpatient Visit Date: 10/15/2016  Primary Care Provider: Binnie Rail, MD [place] [address]  Chief Complaint: Follow-up atrial fibrillation  HPI:  [name] is a 83 y.o. year-old female with history of persistent atrial fibrillation, hypertrophic cardiomyopathy (apical variant), hypertension, hyperlipidemia, hypothyroidism, and chronic kidney diseasewho presents for follow-up of atrial fibrillation. Today, [name] reports feeling relatively well. However, since our last visit 1 month ago, she lost vision in her right eye and was diagnosed with a microaneurysm of the right eye. She is seeing her retina specialist for follow-up later today.  From a heart standpoint, [name] has not noticed much change since our last visit. She continues to have mild fatigue and exertional dyspnea when walking even short distances, which did not change with our change from metoprolol to carvedilol. She has not had chest pain or lightheadedness, though she has noticed occasional brief palpitations, particularly when lying in bed. She has not had any bleeding and remains on apixaban 5 mg BID. She is currently using furosemide 10 mg daily. She notes that her pulse at home is usually around 100 bpm; it was 107 yesterday at rest.  --------------------------------------------------------------------------------------------------  Cardiovascular History & Procedures: Cardiovascular Problems:  Persistent atrial fibtillation  Apical HCM  Risk Factors:  HTN, hyperlipidemia, and age > 28  Cath/PCI:  None  CV Surgery:  None  EP Procedures and Devices:  None  Non-Invasive Evaluation(s):  TTE (09/04/16): Normal LV size with marked apical hypertrophy. LVEF 60-65%. No WMA. Mild MR. Moderate left atrial enlargement. Normal RV size and function. Mild right atrial enlargement.  Recent CV Pertinent Labs: Lab Results  Component Value Date   CHOL 205 (H) 08/20/2016   HDL  54.40 08/20/2016   LDLCALC 123 (H) 08/20/2016   LDLDIRECT 128.9 09/09/2012   TRIG 136.0 08/20/2016   CHOLHDL 4 08/20/2016   K 4.0 08/20/2016   BUN 20 08/20/2016   CREATININE 1.07 08/20/2016    Past medical and surgical history were reviewed and updated in EPIC.  Outpatient Encounter Prescriptions as of 10/15/2016  Medication Sig  . apixaban (ELIQUIS) 5 MG TABS tablet Take 1 tablet (5 mg total) by mouth 2 (two) times daily.  . beta carotene w/minerals (OCUVITE) tablet Take 1 tablet by mouth daily.  . carvedilol (COREG) 6.25 MG tablet Take 1 tablet (6.25 mg total) by mouth 2 (two) times daily.  . cholecalciferol (VITAMIN D) 1000 UNITS tablet Take 2,000 Units by mouth daily.   Marland Kitchen docusate sodium (COLACE) 100 MG capsule Take 1 capsule (100 mg total) by mouth 2 (two) times daily.  . furosemide (LASIX) 20 MG tablet Take 10 mg three times a week, take an additional dose if needed  . levothyroxine (SYNTHROID, LEVOTHROID) 75 MCG tablet TAKE 1 TAB BY MOUTH ONCE DAILY WITH EXCEPTION OF TUESDAYS AND SATURDAYS TAKE HALF A TAB AS DIRECTED  . lisinopril (PRINIVIL,ZESTRIL) 20 MG tablet TAKE 1 TABLET (20 MG TOTAL) BY MOUTH DAILY.  Marland Kitchen LORazepam (ATIVAN) 0.5 MG tablet TAKE 1/2 TAB BY MOUTH DAILY AS NEEDED FOR ANXIETY  . omeprazole (PRILOSEC) 20 MG capsule Take 1 capsule (20 mg total) by mouth daily.   No facility-administered encounter medications on file as of 10/15/2016.     Allergies: Achromycin [tetracycline hcl]; Penicillins; Simvastatin; Celecoxib; Clarithromycin; Ezetimibe-simvastatin; Flagyl [metronidazole]; and Abilify [aripiprazole]  Social History   Social History  . Marital status: Married    Spouse name: N/A  . Number of children: 2  . Years of  education: N/A   Occupational History  . Retired Retired   Social History Main Topics  . Smoking status: Former Smoker    Quit date: 1956  . Smokeless tobacco: Never Used     Comment: smoked 1951-1965 , up to 1/2 ppd  . Alcohol use No  .  Drug use: No  . Sexual activity: Not on file   Other Topics Concern  . Not on file   Social History Narrative   Widowed.  Lives alone.  Ambulates without assistance.  Steady on feet.    Family History  Problem Relation Age of Onset  . Diabetes Mother   . Heart attack Maternal Uncle 87       his 2 sons had MI in 2s  . Hypertension Maternal Grandmother   . Stroke Maternal Grandmother 69  . Cancer Neg Hx   . Asthma Neg Hx   . COPD Neg Hx     Review of Systems: A 12-system review of systems was performed and was negative except as noted in the HPI.  --------------------------------------------------------------------------------------------------  Physical Exam: BP 124/70   Pulse (!) 113   Ht 4\' 9"  (1.448 m)   Wt 154 lb (69.9 kg)   SpO2 98%   BMI 33.33 kg/m   General:  Obese, elderly woman, seated comfortably in the exam room. She is accompanied by her daughter. HEENT: No conjunctival pallor or scleral icterus.  Moist mucous membranes.  OP clear. Neck: Supple without lymphadenopathy, thyromegaly, JVD, or HJR. Lungs: Normal work of breathing.  Clear to auscultation bilaterally without wheezes or crackles. Heart: Irregularly irregular with 1/6 systolic murmur loudest at the LLSB.  Non-displaced PMI. Abd: Bowel sounds present.  Soft, NT/ND without hepatosplenomegaly Ext: Trace pretibial edema bilaterally.  Radial, PT, and DP pulses are 2+ bilaterally. Skin: warm and dry without rash  EKG:  Atrial fibrillation with RVR (ventricular rate 113 bpm). Borderline LVH with widespread ST/T changes. HR increased from prior tracing. Otherwise, there has been no significant change.  Lab Results  Component Value Date   WBC 5.6 08/20/2016   HGB 11.1 (L) 08/20/2016   HCT 33.8 (L) 08/20/2016   MCV 80.8 08/20/2016   PLT 252.0 08/20/2016    Lab Results  Component Value Date   NA 142 08/20/2016   K 4.0 08/20/2016   CL 109 08/20/2016   CO2 24 08/20/2016   BUN 20 08/20/2016    CREATININE 1.07 08/20/2016   GLUCOSE 95 08/20/2016   ALT 7 08/20/2016    Lab Results  Component Value Date   CHOL 205 (H) 08/20/2016   HDL 54.40 08/20/2016   LDLCALC 123 (H) 08/20/2016   LDLDIRECT 128.9 09/09/2012   TRIG 136.0 08/20/2016   CHOLHDL 4 08/20/2016    --------------------------------------------------------------------------------------------------  ASSESSMENT AND PLAN: Persistent atrial fibrillation Symptoms are stable, with continued fatigue and exertional dyspnea. Ventricular rate is suboptimally controlled today. We have agreed to increase carvedilol to 12.5 mg BID. If her heart rate remains inadequately controlled or she develops other side effects, we will need to consider adding and/or switching to diltiazem. We also discussed potential DCCV with or without antiarrhythmic therapy, but have agreed to defer this until her active eye issue has been resolved, in case anticoagulation needs to be held. We will continue with indefinite apixaban, if possible, given her CHADSVASc score of at least 4.  Apical hypertrophic cardiomyopathy Some of [name]' symptoms may be due to apical HCM and diastolic dysfunction, which will be exacerbated by tachycardia. As above,  we will increase carvedilol. I have also asked her to increase furosemide to 20 mg daily. We will check a BMP today and again in ~2 weeks to ensure stable renal function and electrolytes.  Follow-up: Return to clinic in 1 month.  Nelva Bush, MD 10/15/2016 4:19 PM

## 2016-10-18 ENCOUNTER — Telehealth: Payer: Self-pay | Admitting: *Deleted

## 2016-10-18 NOTE — Telephone Encounter (Signed)
Patient informed and verbalized understanding of plan. 

## 2016-10-18 NOTE — Telephone Encounter (Signed)
-----   Message from Nelva Bush, MD sent at 10/16/2016  9:22 PM EDT ----- Please let [name] know that her kidney function and electrolytes are stable. She should continue with increased dose of furosemide and repeat BMP in about 2 weeks.

## 2016-10-30 ENCOUNTER — Other Ambulatory Visit: Payer: Medicare Other

## 2016-10-30 DIAGNOSIS — I481 Persistent atrial fibrillation: Secondary | ICD-10-CM | POA: Diagnosis not present

## 2016-10-30 DIAGNOSIS — I422 Other hypertrophic cardiomyopathy: Secondary | ICD-10-CM

## 2016-10-30 DIAGNOSIS — I4819 Other persistent atrial fibrillation: Secondary | ICD-10-CM

## 2016-10-30 LAB — BASIC METABOLIC PANEL
BUN/Creatinine Ratio: 15 (ref 12–28)
BUN: 20 mg/dL (ref 8–27)
CALCIUM: 9.4 mg/dL (ref 8.7–10.3)
CO2: 21 mmol/L (ref 18–29)
CREATININE: 1.31 mg/dL — AB (ref 0.57–1.00)
Chloride: 105 mmol/L (ref 96–106)
GFR calc Af Amer: 43 mL/min/{1.73_m2} — ABNORMAL LOW (ref >59)
GFR, EST NON AFRICAN AMERICAN: 37 mL/min/{1.73_m2} — AB (ref >59)
GLUCOSE: 104 mg/dL — AB (ref 65–99)
Potassium: 4.4 mmol/L (ref 3.5–5.2)
SODIUM: 142 mmol/L (ref 134–144)

## 2016-11-02 ENCOUNTER — Telehealth: Payer: Self-pay

## 2016-11-02 NOTE — Telephone Encounter (Signed)
If Ms. Gravelle' symptoms are stable, I would not make any changes at this time. If her breathing is noticeably worse, maybe we can work her in to be seen sometime next week. Thanks.  Nelva Bush, MD [facility] Pager: [phone]

## 2016-11-02 NOTE — Telephone Encounter (Signed)
Received incoming call from pt in Triage. Pt stated she has slight SOB when bending over doing yard work and with exertion. Pt denied any other symptoms. Pt stated "I feel fine". Pt stated she noticed SOB around January of this year, and her doctor referred her to Dr. Saunders Revel. Reviewed last ov from Dr. Saunders Revel on 10/15/16.   From ov on 10/15/16 with Dr. Saunders Revel:  From a heart standpoint, Ms. Ton has not noticed much change since our last visit. She continues to have mild fatigue and exertional dyspnea when walking even short distances, which did not change with our change from metoprolol to carvedilol. She has not had chest pain or lightheadedness, though she has noticed occasional brief palpitations, particularly when lying in bed. She has not had any bleeding and remains on apixaban 5 mg BID. She is currently using furosemide 10 mg daily. She notes that her pulse at home is usually around 100 bpm; it was 107 yesterday at rest.  Pt stated her BP from yesterday was 126/80 and HR 91. Reviewed medications. Pt stated she is taking all medications as prescribed. Pt verbalized she is taking 20 mg of Lasix daily and . Asked pt if she takes her weight daily. Pt stated she does not have a scale. Informed it is important for her to take daily weights, as well as daily BP and HR, and keep a log. Pt stated she will purchase a scale and start doing that.Pt's next appt with Dr. Saunders Revel is 11/15/16. Informed I will forward to Dr. Saunders Revel to advise.

## 2016-11-05 NOTE — Telephone Encounter (Signed)
I spoke with patient, pt states her breathing is about the same, does not feel she has had any significant symptom changes.  I offered to schedule appt for pt this week, pt states right now she feels comfortable waiting until 11/15/16 to see Dr End. Pt knows to call if symptoms change or she changes her mind about sooner appointment.

## 2016-11-09 ENCOUNTER — Other Ambulatory Visit: Payer: Self-pay

## 2016-11-15 ENCOUNTER — Encounter: Payer: Self-pay | Admitting: Internal Medicine

## 2016-11-15 ENCOUNTER — Ambulatory Visit (INDEPENDENT_AMBULATORY_CARE_PROVIDER_SITE_OTHER): Payer: Medicare Other | Admitting: Internal Medicine

## 2016-11-15 VITALS — BP 128/80 | HR 98 | Ht <= 58 in | Wt 155.8 lb

## 2016-11-15 DIAGNOSIS — I422 Other hypertrophic cardiomyopathy: Secondary | ICD-10-CM | POA: Diagnosis not present

## 2016-11-15 DIAGNOSIS — I481 Persistent atrial fibrillation: Secondary | ICD-10-CM | POA: Diagnosis not present

## 2016-11-15 DIAGNOSIS — I4819 Other persistent atrial fibrillation: Secondary | ICD-10-CM

## 2016-11-15 DIAGNOSIS — R609 Edema, unspecified: Secondary | ICD-10-CM

## 2016-11-15 DIAGNOSIS — N183 Chronic kidney disease, stage 3 (moderate): Secondary | ICD-10-CM | POA: Diagnosis not present

## 2016-11-15 LAB — BASIC METABOLIC PANEL
BUN/Creatinine Ratio: 15 (ref 12–28)
BUN: 18 mg/dL (ref 8–27)
CALCIUM: 9.4 mg/dL (ref 8.7–10.3)
CO2: 20 mmol/L (ref 20–29)
CREATININE: 1.19 mg/dL — AB (ref 0.57–1.00)
Chloride: 102 mmol/L (ref 96–106)
GFR calc Af Amer: 48 mL/min/{1.73_m2} — ABNORMAL LOW (ref >59)
GFR, EST NON AFRICAN AMERICAN: 41 mL/min/{1.73_m2} — AB (ref >59)
Glucose: 92 mg/dL (ref 65–99)
POTASSIUM: 4.6 mmol/L (ref 3.5–5.2)
Sodium: 139 mmol/L (ref 134–144)

## 2016-11-15 NOTE — Patient Instructions (Signed)
Medication Instructions:  Your physician recommends that you continue on your current medications as directed. Please refer to the Current Medication list given to you today.   Labwork: BMET today  Testing/Procedures: None   Follow-Up: Your physician recommends that you schedule a follow-up appointment first of July with Dr End. This is scheduled for Friday July 6,2018 at 9:20 AM.         If you need a refill on your cardiac medications before your next appointment, please call your pharmacy.

## 2016-11-15 NOTE — Progress Notes (Signed)
Follow-up Outpatient Visit Date: 11/15/2016  Primary Care Provider: Binnie Rail, MD [place] [address]  Chief Complaint: Fatigue and follow-up of atrial fibrillation  HPI:  [name] is a 83 y.o. year-old female with history of persistent atrial fibrillation, hypertrophic cardiomyopathy (apical variant), hypertension, hyperlipidemia, hypothyroidism, and chronic kidney disease, who presents for follow-up of atrial fibrillation. I last saw her on 10/15/16, which time she remained in atrial fibrillation. We increased carvedilol to 12.5 mg twice a day for improved heart rate control. We also scheduled furosemide 20 mg by mouth daily due to some leg swelling and shortness of breath.  Today, [name] reports that she feels a little bit better. She continues to have shortness of breath, particularly when going up the stairs to her house or bending over. Her energy seems a little bit better. She is not had any chest pain, palpitations, lightheadedness, orthopnea, or PND. She continues to have occasional mild dependent edema in her legs, though this has improved with the standing furosemide.  Since her last visit, she was seen by her ophthalmologist, Dr. Zadie Rhine, for evaluation of her retinal problem. Her vision has gradually improved. She believes that apixaban may have been held for 3 days by Dr. Zadie Rhine, though she and her daughter, who accompanies the patient, are unsure of exactly when apixaban was held. [name] is scheduled to see Dr. Zadie Rhine again later this month (6/29).  --------------------------------------------------------------------------------------------------  Cardiovascular History & Procedures: Cardiovascular Problems:  Persistent atrial fibtillation  Apical HCM  Risk Factors:  HTN, hyperlipidemia, and age > 42  Cath/PCI:  None  CV Surgery:  None  EP Procedures and Devices:  None  Non-Invasive Evaluation(s):  TTE (09/04/16): Normal LV size  with marked apical hypertrophy. LVEF 60-65%. No WMA. Mild MR. Moderate left atrial enlargement. Normal RV size and function. Mild right atrial enlargement.  Recent CV Pertinent Labs: Lab Results  Component Value Date   CHOL 205 (H) 08/20/2016   HDL 54.40 08/20/2016   LDLCALC 123 (H) 08/20/2016   LDLDIRECT 128.9 09/09/2012   TRIG 136.0 08/20/2016   CHOLHDL 4 08/20/2016   K 4.4 10/30/2016   BUN 20 10/30/2016   CREATININE 1.31 (H) 10/30/2016    Past medical and surgical history were reviewed and updated in EPIC.  Outpatient Encounter Prescriptions as of 11/15/2016  Medication Sig  . apixaban (ELIQUIS) 5 MG TABS tablet Take 1 tablet (5 mg total) by mouth 2 (two) times daily.  . beta carotene w/minerals (OCUVITE) tablet Take 1 tablet by mouth daily.  . carvedilol (COREG) 12.5 MG tablet Take 1 tablet (12.5 mg total) by mouth 2 (two) times daily.  . cholecalciferol (VITAMIN D) 1000 UNITS tablet Take 2,000 Units by mouth daily.   Marland Kitchen docusate sodium (COLACE) 100 MG capsule Take 1 capsule (100 mg total) by mouth 2 (two) times daily.  . furosemide (LASIX) 20 MG tablet Take 1 tablet (20 mg total) by mouth daily.  Marland Kitchen levothyroxine (SYNTHROID, LEVOTHROID) 75 MCG tablet TAKE 1 TAB BY MOUTH ONCE DAILY WITH EXCEPTION OF TUESDAYS AND SATURDAYS TAKE HALF A TAB AS DIRECTED  . lisinopril (PRINIVIL,ZESTRIL) 20 MG tablet TAKE 1 TABLET (20 MG TOTAL) BY MOUTH DAILY.  Marland Kitchen LORazepam (ATIVAN) 0.5 MG tablet TAKE 1/2 TAB BY MOUTH DAILY AS NEEDED FOR ANXIETY  . omeprazole (PRILOSEC) 20 MG capsule Take 1 capsule (20 mg total) by mouth daily.   No facility-administered encounter medications on file as of 11/15/2016.     Allergies:  Achromycin [tetracycline hcl]; Penicillins; Simvastatin; Celecoxib; Clarithromycin; Ezetimibe-simvastatin; Flagyl [metronidazole]; and Abilify [aripiprazole]  Social History   Social History  . Marital status: Married    Spouse name: N/A  . Number of children: 2  . Years of education:  N/A   Occupational History  . Retired Retired   Social History Main Topics  . Smoking status: Former Smoker    Packs/day: 0.50    Years: 14.00    Quit date: 40  . Smokeless tobacco: Never Used     Comment: smoked 1951-1965 , up to 1/2 ppd  . Alcohol use No  . Drug use: No  . Sexual activity: Not on file   Other Topics Concern  . Not on file   Social History Narrative   Widowed.  Lives alone.  Ambulates without assistance.  Steady on feet.    Family History  Problem Relation Age of Onset  . Diabetes Mother   . Heart attack Maternal Uncle 82       his 2 sons had MI in 40s  . Hypertension Maternal Grandmother   . Stroke Maternal Grandmother 69  . Cancer Neg Hx   . Asthma Neg Hx   . COPD Neg Hx     Review of Systems: A 12-system review of systems was performed and was negative except as noted in the HPI.  --------------------------------------------------------------------------------------------------  Physical Exam: BP 128/80   Pulse 98   Ht 4\' 9"  (1.448 m)   Wt 155 lb 12.8 oz (70.7 kg)   BMI 33.71 kg/m   General:  Obese, elderly woman, seated comfortably in the exam room. She is accompanied by her daughter. HEENT: No conjunctival pallor or scleral icterus.  Moist mucous membranes.  OP clear. Neck: Supple without lymphadenopathy, thyromegaly, JVD, or HJR.  No carotid bruit. Lungs: Normal work of breathing.  Clear to auscultation bilaterally without wheezes or crackles. Heart: Distant heart sounds. Irregularly irregular with 1/6 systolic murmur loudest at the left lower sternal border..  Non-displaced PMI. Abd: Bowel sounds present.  Soft, NT/ND without hepatosplenomegaly Ext: Trace ankle edema bilaterally.  Radial, PT, and DP pulses are 2+ bilaterally. Skin: Warm and dry without rash.  EKG: Atrial fibrillation (ventricular rate 98 bpm) with anterolateral and inferior T-wave inversions. Heart rate has decreased since prior tracing on 10/15/16. Otherwise, there  has been no significant interval change.  Lab Results  Component Value Date   WBC 5.6 08/20/2016   HGB 11.1 (L) 08/20/2016   HCT 33.8 (L) 08/20/2016   MCV 80.8 08/20/2016   PLT 252.0 08/20/2016    Lab Results  Component Value Date   NA 142 10/30/2016   K 4.4 10/30/2016   CL 105 10/30/2016   CO2 21 10/30/2016   BUN 20 10/30/2016   CREATININE 1.31 (H) 10/30/2016   GLUCOSE 104 (H) 10/30/2016   ALT 7 08/20/2016    Lab Results  Component Value Date   CHOL 205 (H) 08/20/2016   HDL 54.40 08/20/2016   LDLCALC 123 (H) 08/20/2016   LDLDIRECT 128.9 09/09/2012   TRIG 136.0 08/20/2016   CHOLHDL 4 08/20/2016    --------------------------------------------------------------------------------------------------  ASSESSMENT AND PLAN: Persistent atrial fibrillation [name] remains in atrial fibrillation today, albeit with somewhat better heart rate control. She continues to have exertional dyspnea and fatigue, though this seems to be a little bit better with more aggressive rate control. I think she would benefit from an attempt at restoration of sinus rhythm. However, it is somewhat unclear when she held apixaban for  3 days. She is also due for reevaluation of her retinal issue later this month. We will therefore defer cardioversion until after she has seen Dr. Zadie Rhine on 11/30/16, in case I intervention that would necessitate discontinuation of anticoagulation is needed. We will not make any medication changes today; she should continue with carvedilol 12.5 mg twice a day and apixaban 5 mg twice a day, given her CHADSVASC score of at least 4.  Apical hypertrophic cardiomyopathy [name] has trace ankle edema on exam today but otherwise appears euvolemic and has been slightly less symptomatic than at our last visit. We discussed further up titration of carvedilol but have agreed to keep things where they are, given that she feels relatively well. As above, I think that she would benefit from  restoration of sinus rhythm in the setting of her hypertrophic cardiomyopathy. We will continue her current dose of furosemide. I will check a basic metabolic panel today to ensure that her renal function and electrolytes are stable to improved, given slight bump in creatinine on last check.  Follow-up: Return to clinic in early July.  Nelva Bush, MD 11/15/2016 8:48 AM

## 2016-11-18 ENCOUNTER — Other Ambulatory Visit: Payer: Self-pay | Admitting: Internal Medicine

## 2016-11-26 DIAGNOSIS — H43811 Vitreous degeneration, right eye: Secondary | ICD-10-CM | POA: Diagnosis not present

## 2016-11-26 DIAGNOSIS — H35012 Changes in retinal vascular appearance, left eye: Secondary | ICD-10-CM | POA: Diagnosis not present

## 2016-11-26 DIAGNOSIS — H3562 Retinal hemorrhage, left eye: Secondary | ICD-10-CM | POA: Diagnosis not present

## 2016-11-26 DIAGNOSIS — H35032 Hypertensive retinopathy, left eye: Secondary | ICD-10-CM | POA: Diagnosis not present

## 2016-11-26 DIAGNOSIS — H353132 Nonexudative age-related macular degeneration, bilateral, intermediate dry stage: Secondary | ICD-10-CM | POA: Diagnosis not present

## 2016-12-07 ENCOUNTER — Ambulatory Visit (INDEPENDENT_AMBULATORY_CARE_PROVIDER_SITE_OTHER): Payer: Medicare Other | Admitting: Internal Medicine

## 2016-12-07 ENCOUNTER — Encounter: Payer: Self-pay | Admitting: Internal Medicine

## 2016-12-07 VITALS — BP 120/80 | HR 87 | Ht <= 58 in | Wt 156.2 lb

## 2016-12-07 DIAGNOSIS — I481 Persistent atrial fibrillation: Secondary | ICD-10-CM

## 2016-12-07 DIAGNOSIS — I422 Other hypertrophic cardiomyopathy: Secondary | ICD-10-CM

## 2016-12-07 DIAGNOSIS — I4819 Other persistent atrial fibrillation: Secondary | ICD-10-CM

## 2016-12-07 NOTE — Patient Instructions (Addendum)
Medication Instructions:  Your physician recommends that you continue on your current medications as directed. Please refer to the Current Medication list given to you today.  Labwork: None ordered  Testing/Procedures: None ordered  Follow-Up: Your physician recommends that you schedule a follow-up appointment in: 3 MONTHS WITH DR. END   Any Other Special Instructions Will Be Listed Below (If Applicable).     If you need a refill on your cardiac medications before your next appointment, please call your pharmacy.

## 2016-12-07 NOTE — Progress Notes (Signed)
Follow-up Outpatient Visit Date: 12/07/2016  Primary Care Provider: Binnie Rail, MD [place] [address]  Chief Complaint: Follow-up persistent atrial fibrillation  HPI:  [name] is a 83 y.o. year-old female with history of persistent atrial fibrillation, hypertrophic cardiomyopathy (apical variant), hypertension, hyperlipidemia, hypothyroidism, and chronic kidney disease, who presents for follow-up of atrial fibrillation and fatigue. I last saw her on 11/15/16, at which time she continued to have notable fatigue. She was due to see her ophthalmologist, Dr. Zadie Rhine, at the [facility]. We therefore agreed to defer DCCV until after this appointment.  Today, [name] states that her fatigue has improved. She denies chest pain and shortness of breath. She has rare flutters in her chest but is mostly unaware of the atrial fibrillation. She denies edema, orthopnea, PND, and lightheadedness. She remains on carvedilol and apixaban; she denies significant bleeding. [name] was advised to continue with anticoagulation by  Dr. Zadie Rhine. I also spoke with him by phone after our last visit. 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 He also sees no contraindication to cardioversion from an eye standpoint.  -------------------------------------------------------------------------------------------------- Cardiovascular History & Procedures: Cardiovascular Problems:  Persistent atrial fibtillation  Apical HCM  Risk Factors:  HTN, hyperlipidemia, and age > 83  Cath/PCI:  None  CV Surgery:  None  EP Procedures and Devices:  None  Non-Invasive Evaluation(s):  TTE (09/04/16): Normal LV size with marked apical hypertrophy. LVEF 60-65%. No WMA. Mild MR. Moderate left atrial enlargement. Normal RV size and function. Mild right atrial enlargement.  Recent CV Pertinent Labs: Lab Results  Component  Value Date   CHOL 205 (H) 08/20/2016   HDL 54.40 08/20/2016   LDLCALC 123 (H) 08/20/2016   LDLDIRECT 128.9 09/09/2012   TRIG 136.0 08/20/2016   CHOLHDL 4 08/20/2016   K 4.6 11/15/2016   BUN 18 11/15/2016   CREATININE 1.19 (H) 11/15/2016    Past medical and surgical history were reviewed and updated in EPIC.  Current Meds  Medication Sig  . apixaban (ELIQUIS) 5 MG TABS tablet Take 1 tablet (5 mg total) by mouth 2 (two) times daily.  . beta carotene w/minerals (OCUVITE) tablet Take 1 tablet by mouth daily.  . carvedilol (COREG) 12.5 MG tablet Take 1 tablet (12.5 mg total) by mouth 2 (two) times daily.  . cholecalciferol (VITAMIN D) 1000 UNITS tablet Take 2,000 Units by mouth daily.   Marland Kitchen docusate sodium (COLACE) 100 MG capsule Take 1 capsule (100 mg total) by mouth 2 (two) times daily.  . furosemide (LASIX) 20 MG tablet Take 1 tablet (20 mg total) by mouth daily.  Marland Kitchen levothyroxine (SYNTHROID, LEVOTHROID) 75 MCG tablet TAKE 1 TAB BY MOUTH ONCE DAILY WITH EXCEPTION OF TUESDAYS AND SATURDAYS TAKE HALF A TAB AS DIRECTED  . lisinopril (PRINIVIL,ZESTRIL) 20 MG tablet TAKE 1 TABLET (20 MG TOTAL) BY MOUTH DAILY.  Marland Kitchen LORazepam (ATIVAN) 0.5 MG tablet TAKE 1/2 TAB BY MOUTH DAILY AS NEEDED FOR ANXIETY  . omeprazole (PRILOSEC) 20 MG capsule Take 1 capsule (20 mg total) by mouth daily.    Allergies: Achromycin [tetracycline hcl]; Penicillins; Simvastatin; Celecoxib; Clarithromycin; Ezetimibe-simvastatin; Flagyl [metronidazole]; and Abilify [aripiprazole]  Social History   Social History  . Marital status: Married    Spouse name: N/A  . Number of children: 2  . Years of education: N/A   Occupational History  . Retired Retired   Social History Main Topics  .  Smoking status: Former Smoker    Packs/day: 0.50    Years: 14.00    Quit date: 34  . Smokeless tobacco: Never Used     Comment: smoked 1951-1965 , up to 1/2 ppd  . Alcohol use No  . Drug use: No  . Sexual activity: Not on file    Other Topics Concern  . Not on file   Social History Narrative   Widowed.  Lives alone.  Ambulates without assistance.  Steady on feet.    Family History  Problem Relation Age of Onset  . Diabetes Mother   . Heart attack Maternal Uncle 80       his 2 sons had MI in 19s  . Hypertension Maternal Grandmother   . Stroke Maternal Grandmother 69  . Cancer Neg Hx   . Asthma Neg Hx   . COPD Neg Hx     Review of Systems: A 12-system review of systems was performed and was negative except as noted in the HPI.  --------------------------------------------------------------------------------------------------  Physical Exam: BP 120/80   Pulse 87   Ht 4\' 9"  (1.448 m)   Wt 156 lb 3.2 oz (70.9 kg)   LMP  (LMP Unknown)   BMI 33.80 kg/m   General:  Obese, elderly woman, seated comfortably in the exam room. She is accompanied by her daughter. HEENT: No conjunctival pallor or scleral icterus. Moist mucous membranes.  OP clear. Neck: Supple without lymphadenopathy, thyromegaly, JVD, or HJR. Lungs: Normal work of breathing. Clear to auscultation bilaterally without wheezes or crackles. Heart: Distant heart sounds. Irregularly irregular without murmurs, rubs, or gallops. Non-displaced PMI. Abd: Bowel sounds present. Soft, NT/ND without hepatosplenomegaly Ext: No lower extremity edema. Radial, PT, and DP pulses are 2+ bilaterally. Skin: Warm and dry without rash.  EKG:  Atrial fibrillation (ventricular rate 87 bpm) with inferolateral T-wave inversions. No significant change from prior tracing.  Lab Results  Component Value Date   WBC 5.6 08/20/2016   HGB 11.1 (L) 08/20/2016   HCT 33.8 (L) 08/20/2016   MCV 80.8 08/20/2016   PLT 252.0 08/20/2016    Lab Results  Component Value Date   NA 139 11/15/2016   K 4.6 11/15/2016   CL 102 11/15/2016   CO2 20 11/15/2016   BUN 18 11/15/2016   CREATININE 1.19 (H) 11/15/2016   GLUCOSE 92 11/15/2016   ALT 7 08/20/2016    Lab Results   Component Value Date   CHOL 205 (H) 08/20/2016   HDL 54.40 08/20/2016   LDLCALC 123 (H) 08/20/2016   LDLDIRECT 128.9 09/09/2012   TRIG 136.0 08/20/2016   CHOLHDL 4 08/20/2016    --------------------------------------------------------------------------------------------------  ASSESSMENT AND PLAN: Persistent atrial fibrillation [name] remains in atrial fibrillation but is adequately rate-controlled. She is tolerating apixaban well and has been adequately anticoagulated for more than 1 month. I recommended that we proceed with elective cardioversion. However, her granddaughter is currently pregnant (high-risk) and is expected to deliver within the next month. [name] and her daughter would therefore like to defer DCCV until after the delivery. Given improving symptoms with adequate HR control, I think this is reasonable. We will not make any medication changes today. [name] will contact us to arrange elective cardioversion at her convenience.  Hypertrophic cardiomyopathy [name] appears euvolemic and well-compensated with NYHA class II symptoms. I still think that restoration of A-V synchrony would be beneficial. We will plan for DCCV, as above.  Follow-up: Return to clinic in 3 months.  Amber Bush, MD  12/07/2016 9:38 AM

## 2016-12-08 ENCOUNTER — Encounter: Payer: Self-pay | Admitting: Internal Medicine

## 2016-12-13 ENCOUNTER — Telehealth: Payer: Self-pay | Admitting: Internal Medicine

## 2016-12-13 ENCOUNTER — Encounter: Payer: Self-pay | Admitting: *Deleted

## 2016-12-13 DIAGNOSIS — I4819 Other persistent atrial fibrillation: Secondary | ICD-10-CM

## 2016-12-13 DIAGNOSIS — Z01812 Encounter for preprocedural laboratory examination: Secondary | ICD-10-CM

## 2016-12-13 NOTE — Telephone Encounter (Signed)
New message     Daughter calling to discuss cardioversion with you , if she is to have it done they need to schedule in August, she has a few question

## 2016-12-13 NOTE — Telephone Encounter (Signed)
DCCV  SCHEDULED FOR 01-03-17 AT  1:00 PM PT  TO BE  AT  SHORT STAY  AT 11:00 AM  DAUGHTER  AWARE./[facility]  TO BE DONE  12-31-16

## 2016-12-28 DIAGNOSIS — L814 Other melanin hyperpigmentation: Secondary | ICD-10-CM | POA: Diagnosis not present

## 2016-12-28 DIAGNOSIS — Z85828 Personal history of other malignant neoplasm of skin: Secondary | ICD-10-CM | POA: Diagnosis not present

## 2016-12-28 DIAGNOSIS — D1801 Hemangioma of skin and subcutaneous tissue: Secondary | ICD-10-CM | POA: Diagnosis not present

## 2016-12-28 DIAGNOSIS — D225 Melanocytic nevi of trunk: Secondary | ICD-10-CM | POA: Diagnosis not present

## 2016-12-28 DIAGNOSIS — D485 Neoplasm of uncertain behavior of skin: Secondary | ICD-10-CM | POA: Diagnosis not present

## 2016-12-28 DIAGNOSIS — D0439 Carcinoma in situ of skin of other parts of face: Secondary | ICD-10-CM | POA: Diagnosis not present

## 2016-12-28 DIAGNOSIS — L570 Actinic keratosis: Secondary | ICD-10-CM | POA: Diagnosis not present

## 2016-12-28 DIAGNOSIS — L820 Inflamed seborrheic keratosis: Secondary | ICD-10-CM | POA: Diagnosis not present

## 2016-12-28 DIAGNOSIS — L821 Other seborrheic keratosis: Secondary | ICD-10-CM | POA: Diagnosis not present

## 2016-12-31 ENCOUNTER — Other Ambulatory Visit: Payer: Medicare Other | Admitting: *Deleted

## 2016-12-31 DIAGNOSIS — I481 Persistent atrial fibrillation: Secondary | ICD-10-CM | POA: Diagnosis not present

## 2016-12-31 DIAGNOSIS — Z01812 Encounter for preprocedural laboratory examination: Secondary | ICD-10-CM

## 2016-12-31 DIAGNOSIS — I4819 Other persistent atrial fibrillation: Secondary | ICD-10-CM

## 2016-12-31 LAB — BASIC METABOLIC PANEL
BUN/Creatinine Ratio: 17 (ref 12–28)
BUN: 20 mg/dL (ref 8–27)
CALCIUM: 8.6 mg/dL — AB (ref 8.7–10.3)
CO2: 19 mmol/L — AB (ref 20–29)
Chloride: 104 mmol/L (ref 96–106)
Creatinine, Ser: 1.19 mg/dL — ABNORMAL HIGH (ref 0.57–1.00)
GFR calc Af Amer: 48 mL/min/{1.73_m2} — ABNORMAL LOW (ref >59)
GFR calc non Af Amer: 41 mL/min/{1.73_m2} — ABNORMAL LOW (ref >59)
Glucose: 106 mg/dL — ABNORMAL HIGH (ref 65–99)
POTASSIUM: 4.5 mmol/L (ref 3.5–5.2)
SODIUM: 139 mmol/L (ref 134–144)

## 2016-12-31 LAB — CBC WITH DIFFERENTIAL/PLATELET
BASOS ABS: 0 10*3/uL (ref 0.0–0.2)
Basos: 0 %
EOS (ABSOLUTE): 0.1 10*3/uL (ref 0.0–0.4)
Eos: 2 %
Hematocrit: 31.9 % — ABNORMAL LOW (ref 34.0–46.6)
Hemoglobin: 10.4 g/dL — ABNORMAL LOW (ref 11.1–15.9)
IMMATURE GRANULOCYTES: 0 %
Immature Grans (Abs): 0 10*3/uL (ref 0.0–0.1)
LYMPHS ABS: 1.3 10*3/uL (ref 0.7–3.1)
Lymphs: 28 %
MCH: 26.8 pg (ref 26.6–33.0)
MCHC: 32.6 g/dL (ref 31.5–35.7)
MCV: 82 fL (ref 79–97)
MONOS ABS: 0.4 10*3/uL (ref 0.1–0.9)
Monocytes: 8 %
NEUTROS PCT: 62 %
Neutrophils Absolute: 2.9 10*3/uL (ref 1.4–7.0)
PLATELETS: 232 10*3/uL (ref 150–379)
RBC: 3.88 x10E6/uL (ref 3.77–5.28)
RDW: 15.3 % (ref 12.3–15.4)
WBC: 4.7 10*3/uL (ref 3.4–10.8)

## 2016-12-31 LAB — PROTIME-INR
INR: 1.1 (ref 0.8–1.2)
PROTHROMBIN TIME: 11.8 s (ref 9.1–12.0)

## 2017-01-03 ENCOUNTER — Encounter (HOSPITAL_COMMUNITY): Payer: Self-pay

## 2017-01-03 ENCOUNTER — Ambulatory Visit (HOSPITAL_COMMUNITY): Payer: Medicare Other | Admitting: Certified Registered"

## 2017-01-03 ENCOUNTER — Ambulatory Visit (HOSPITAL_COMMUNITY)
Admission: RE | Admit: 2017-01-03 | Discharge: 2017-01-03 | Disposition: A | Discharge status: Home / Self Care | Payer: Medicare Other | Source: Ambulatory Visit | Attending: Cardiovascular Disease | Admitting: Cardiovascular Disease

## 2017-01-03 ENCOUNTER — Encounter (HOSPITAL_COMMUNITY)
Admission: RE | Disposition: A | Discharge status: Home / Self Care | Payer: Self-pay | Source: Ambulatory Visit | Attending: Cardiovascular Disease

## 2017-01-03 DIAGNOSIS — I481 Persistent atrial fibrillation: Secondary | ICD-10-CM | POA: Insufficient documentation

## 2017-01-03 DIAGNOSIS — E039 Hypothyroidism, unspecified: Secondary | ICD-10-CM | POA: Diagnosis not present

## 2017-01-03 DIAGNOSIS — Z7901 Long term (current) use of anticoagulants: Secondary | ICD-10-CM | POA: Diagnosis not present

## 2017-01-03 DIAGNOSIS — Z79899 Other long term (current) drug therapy: Secondary | ICD-10-CM | POA: Diagnosis not present

## 2017-01-03 DIAGNOSIS — I422 Other hypertrophic cardiomyopathy: Secondary | ICD-10-CM | POA: Insufficient documentation

## 2017-01-03 DIAGNOSIS — E785 Hyperlipidemia, unspecified: Secondary | ICD-10-CM | POA: Diagnosis not present

## 2017-01-03 DIAGNOSIS — Z87891 Personal history of nicotine dependence: Secondary | ICD-10-CM | POA: Insufficient documentation

## 2017-01-03 DIAGNOSIS — I4891 Unspecified atrial fibrillation: Secondary | ICD-10-CM | POA: Diagnosis not present

## 2017-01-03 DIAGNOSIS — I129 Hypertensive chronic kidney disease with stage 1 through stage 4 chronic kidney disease, or unspecified chronic kidney disease: Secondary | ICD-10-CM | POA: Diagnosis not present

## 2017-01-03 DIAGNOSIS — K219 Gastro-esophageal reflux disease without esophagitis: Secondary | ICD-10-CM | POA: Insufficient documentation

## 2017-01-03 DIAGNOSIS — N189 Chronic kidney disease, unspecified: Secondary | ICD-10-CM | POA: Diagnosis not present

## 2017-01-03 DIAGNOSIS — I480 Paroxysmal atrial fibrillation: Secondary | ICD-10-CM

## 2017-01-03 HISTORY — PX: CARDIOVERSION: SHX1299

## 2017-01-03 SURGERY — CARDIOVERSION
Anesthesia: General

## 2017-01-03 MED ORDER — SODIUM CHLORIDE 0.9 % IV SOLN
INTRAVENOUS | Status: DC | PRN
  Administered 2017-01-03: 12:00:00 via INTRAVENOUS

## 2017-01-03 MED ORDER — PROPOFOL 10 MG/ML IV BOLUS
INTRAVENOUS | Status: DC | PRN
  Administered 2017-01-03: 50 mg via INTRAVENOUS

## 2017-01-03 MED ORDER — LIDOCAINE 2% (20 MG/ML) 5 ML SYRINGE
INTRAMUSCULAR | Status: DC | PRN
  Administered 2017-01-03: 60 mg via INTRAVENOUS

## 2017-01-03 NOTE — Anesthesia Preprocedure Evaluation (Addendum)
Anesthesia Evaluation  Patient identified by MRN, date of birth, ID band Patient awake    Reviewed: Allergy & Precautions, H&P , NPO status , Patient's Chart, lab work & pertinent test results, reviewed documented beta blocker date and time   Airway Mallampati: II  TM Distance: >3 FB Neck ROM: Full    Dental no notable dental hx. (+) Teeth Intact, Dental Advisory Given   Pulmonary neg pulmonary ROS, former smoker,    Pulmonary exam normal breath sounds clear to auscultation       Cardiovascular hypertension, Pt. on medications and Pt. on home beta blockers + dysrhythmias Atrial Fibrillation  Rhythm:Irregular Rate:Tachycardia     Neuro/Psych negative neurological ROS     GI/Hepatic Neg liver ROS, PUD, GERD  ,  Endo/Other  Hypothyroidism   Renal/GU negative Renal ROS  negative genitourinary   Musculoskeletal  (+) Arthritis , Osteoarthritis,    Abdominal   Peds  Hematology negative hematology ROS (+) anemia ,   Anesthesia Other Findings   Reproductive/Obstetrics negative OB ROS                            Anesthesia Physical Anesthesia Plan  ASA: III  Anesthesia Plan: General   Post-op Pain Management:    Induction: Intravenous  PONV Risk Score and Plan: 3 and Treatment may vary due to age or medical condition  Airway Management Planned: Mask  Additional Equipment:   Intra-op Plan:   Post-operative Plan:   Informed Consent: I have reviewed the patients History and Physical, chart, labs and discussed the procedure including the risks, benefits and alternatives for the proposed anesthesia with the patient or authorized representative who has indicated his/her understanding and acceptance.   Dental advisory given  Plan Discussed with: CRNA  Anesthesia Plan Comments:         Anesthesia Quick Evaluation

## 2017-01-03 NOTE — Anesthesia Postprocedure Evaluation (Signed)
Anesthesia Post Note  Patient: [name]  Procedure(s) Performed: Procedure(s) (LRB): CARDIOVERSION (N/A)     Patient location during evaluation: PACU Anesthesia Type: General Level of consciousness: awake and alert Pain management: pain level controlled Vital Signs Assessment: post-procedure vital signs reviewed and stable Respiratory status: spontaneous breathing, nonlabored ventilation and respiratory function stable Cardiovascular status: blood pressure returned to baseline and stable Postop Assessment: no signs of nausea or vomiting Anesthetic complications: no    Last Vitals:  Vitals:   01/03/17 1350 01/03/17 1400  BP: (!) 116/41 (!) 128/51  Pulse: (!) 54 61  Resp: 15 15  Temp:      Last Pain:  Vitals:   01/03/17 1152  TempSrc: Oral                 Findley Blankenbaker,W. EDMOND

## 2017-01-03 NOTE — CV Procedure (Signed)
DCC: Anesthesia: Fitzgerald 50 mg propofol 60 mg lidocaine  DCC x 2 120 -> 150 J converted from afib rate 112 to NSR rate 58 Did have some ambient PAC;s post conversion  On Rx Eliquis with no missed doses  Jenkins Rouge

## 2017-01-03 NOTE — Anesthesia Procedure Notes (Signed)
Procedure Name: General with mask airway Date/Time: 01/03/2017 12:36 PM Performed by: Orlie Dakin Pre-anesthesia Checklist: Patient identified, Emergency Drugs available, Suction available, Patient being monitored and Timeout performed Patient Re-evaluated:Patient Re-evaluated prior to induction Oxygen Delivery Method: Ambu bag Preoxygenation: Pre-oxygenation with 100% oxygen

## 2017-01-03 NOTE — Transfer of Care (Signed)
Immediate Anesthesia Transfer of Care Note  Patient: [name]  Procedure(s) Performed: Procedure(s): CARDIOVERSION (N/A)  Patient Location: Endoscopy Unit  Anesthesia Type:General  Level of Consciousness: drowsy and patient cooperative  Airway & Oxygen Therapy: Patient Spontanous Breathing  Post-op Assessment: Report given to RN and Post -op Vital signs reviewed and stable  Post vital signs: Reviewed and stable  Last Vitals:  Vitals:   01/03/17 1152  BP: 118/76  Pulse: (!) 105  Resp: 19  Temp: 36.8 C    Last Pain:  Vitals:   01/03/17 1152  TempSrc: Oral         Complications: No apparent anesthesia complications

## 2017-01-03 NOTE — Discharge Instructions (Signed)
Electrical Cardioversion, Care After °This sheet gives you information about how to care for yourself after your procedure. Your health care provider may also give you more specific instructions. If you have problems or questions, contact your health care provider. °What can I expect after the procedure? °After the procedure, it is common to have: °· Some redness on the skin where the shocks were given. ° °Follow these instructions at home: °· Do not drive for 24 hours if you were given a medicine to help you relax (sedative). °· Take over-the-counter and prescription medicines only as told by your health care provider. °· Ask your health care provider how to check your pulse. Check it often. °· Rest for 48 hours after the procedure or as told by your health care provider. °· Avoid or limit your caffeine use as told by your health care provider. °Contact a health care provider if: °· You feel like your heart is beating too quickly or your pulse is not regular. °· You have a serious muscle cramp that does not go away. °Get help right away if: °· You have discomfort in your chest. °· You are dizzy or you feel faint. °· You have trouble breathing or you are short of breath. °· Your speech is slurred. °· You have trouble moving an arm or leg on one side of your body. °· Your fingers or toes turn cold or blue. °This information is not intended to replace advice given to you by your health care provider. Make sure you discuss any questions you have with your health care provider. °Document Released: 03/11/2013 Document Revised: 12/23/2015 Document Reviewed: 11/25/2015 °Elsevier Interactive Patient Education © 2018 [employer] ° °

## 2017-01-03 NOTE — Interval H&P Note (Signed)
History and Physical Interval Note:  01/03/2017 12:44 PM  [name]  has presented today for surgery, with the diagnosis of A-FIB  The various methods of treatment have been discussed with the patient and family. After consideration of risks, benefits and other options for treatment, the patient has consented to  Procedure(s): CARDIOVERSION (N/A) as a surgical intervention .  The patient's history has been reviewed, patient examined, no change in status, stable for surgery.  I have reviewed the patient's chart and labs.  Questions were answered to the patient's satisfaction.     Jenkins Rouge

## 2017-01-03 NOTE — H&P (View-Only) (Signed)
Follow-up Outpatient Visit Date: 12/07/2016  Primary Care Provider: Binnie Rail, MD [place] [address]  Chief Complaint: Follow-up persistent atrial fibrillation  HPI:  Ms. Gibbs is a 83 y.o. year-old female with history of persistent atrial fibrillation, hypertrophic cardiomyopathy (apical variant), hypertension, hyperlipidemia, hypothyroidism, and chronic kidney disease, who presents for follow-up of atrial fibrillation and fatigue. I last saw her on 11/15/16, at which time she continued to have notable fatigue. She was due to see her ophthalmologist, Dr. Zadie Rhine, at the [facility]. We therefore agreed to defer DCCV until after this appointment.  Today, Ms. Trefz states that her fatigue has improved. She denies chest pain and shortness of breath. She has rare flutters in her chest but is mostly unaware of the atrial fibrillation. She denies edema, orthopnea, PND, and lightheadedness. She remains on carvedilol and apixaban; she denies significant bleeding. Ms. Charters was advised to continue with anticoagulation by  Dr. Zadie Rhine. I also spoke with him by phone after our last visit. 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 He also sees no contraindication to cardioversion from an eye standpoint.  -------------------------------------------------------------------------------------------------- Cardiovascular History & Procedures: Cardiovascular Problems:  Persistent atrial fibtillation  Apical HCM  Risk Factors:  HTN, hyperlipidemia, and age > 3  Cath/PCI:  None  CV Surgery:  None  EP Procedures and Devices:  None  Non-Invasive Evaluation(s):  TTE (09/04/16): Normal LV size with marked apical hypertrophy. LVEF 60-65%. No WMA. Mild MR. Moderate left atrial enlargement. Normal RV size and function. Mild right atrial enlargement.  Recent CV Pertinent Labs: Lab Results  Component  Value Date   CHOL 205 (H) 08/20/2016   HDL 54.40 08/20/2016   LDLCALC 123 (H) 08/20/2016   LDLDIRECT 128.9 09/09/2012   TRIG 136.0 08/20/2016   CHOLHDL 4 08/20/2016   K 4.6 11/15/2016   BUN 18 11/15/2016   CREATININE 1.19 (H) 11/15/2016    Past medical and surgical history were reviewed and updated in EPIC.  Current Meds  Medication Sig  . apixaban (ELIQUIS) 5 MG TABS tablet Take 1 tablet (5 mg total) by mouth 2 (two) times daily.  . beta carotene w/minerals (OCUVITE) tablet Take 1 tablet by mouth daily.  . carvedilol (COREG) 12.5 MG tablet Take 1 tablet (12.5 mg total) by mouth 2 (two) times daily.  . cholecalciferol (VITAMIN D) 1000 UNITS tablet Take 2,000 Units by mouth daily.   Marland Kitchen docusate sodium (COLACE) 100 MG capsule Take 1 capsule (100 mg total) by mouth 2 (two) times daily.  . furosemide (LASIX) 20 MG tablet Take 1 tablet (20 mg total) by mouth daily.  Marland Kitchen levothyroxine (SYNTHROID, LEVOTHROID) 75 MCG tablet TAKE 1 TAB BY MOUTH ONCE DAILY WITH EXCEPTION OF TUESDAYS AND SATURDAYS TAKE HALF A TAB AS DIRECTED  . lisinopril (PRINIVIL,ZESTRIL) 20 MG tablet TAKE 1 TABLET (20 MG TOTAL) BY MOUTH DAILY.  Marland Kitchen LORazepam (ATIVAN) 0.5 MG tablet TAKE 1/2 TAB BY MOUTH DAILY AS NEEDED FOR ANXIETY  . omeprazole (PRILOSEC) 20 MG capsule Take 1 capsule (20 mg total) by mouth daily.    Allergies: Achromycin [tetracycline hcl]; Penicillins; Simvastatin; Celecoxib; Clarithromycin; Ezetimibe-simvastatin; Flagyl [metronidazole]; and Abilify [aripiprazole]  Social History   Social History  . Marital status: Married    Spouse name: N/A  . Number of children: 2  . Years of education: N/A   Occupational History  . Retired Retired   Social History Main Topics  .  Smoking status: Former Smoker    Packs/day: 0.50    Years: 14.00    Quit date: 31  . Smokeless tobacco: Never Used     Comment: smoked 1951-1965 , up to 1/2 ppd  . Alcohol use No  . Drug use: No  . Sexual activity: Not on file    Other Topics Concern  . Not on file   Social History Narrative   Widowed.  Lives alone.  Ambulates without assistance.  Steady on feet.    Family History  Problem Relation Age of Onset  . Diabetes Mother   . Heart attack Maternal Uncle 26       his 2 sons had MI in 53s  . Hypertension Maternal Grandmother   . Stroke Maternal Grandmother 69  . Cancer Neg Hx   . Asthma Neg Hx   . COPD Neg Hx     Review of Systems: A 12-system review of systems was performed and was negative except as noted in the HPI.  --------------------------------------------------------------------------------------------------  Physical Exam: BP 120/80   Pulse 87   Ht 4\' 9"  (1.448 m)   Wt 156 lb 3.2 oz (70.9 kg)   LMP  (LMP Unknown)   BMI 33.80 kg/m   General:  Obese, elderly woman, seated comfortably in the exam room. She is accompanied by her daughter. HEENT: No conjunctival pallor or scleral icterus. Moist mucous membranes.  OP clear. Neck: Supple without lymphadenopathy, thyromegaly, JVD, or HJR. Lungs: Normal work of breathing. Clear to auscultation bilaterally without wheezes or crackles. Heart: Distant heart sounds. Irregularly irregular without murmurs, rubs, or gallops. Non-displaced PMI. Abd: Bowel sounds present. Soft, NT/ND without hepatosplenomegaly Ext: No lower extremity edema. Radial, PT, and DP pulses are 2+ bilaterally. Skin: Warm and dry without rash.  EKG:  Atrial fibrillation (ventricular rate 87 bpm) with inferolateral T-wave inversions. No significant change from prior tracing.  Lab Results  Component Value Date   WBC 5.6 08/20/2016   HGB 11.1 (L) 08/20/2016   HCT 33.8 (L) 08/20/2016   MCV 80.8 08/20/2016   PLT 252.0 08/20/2016    Lab Results  Component Value Date   NA 139 11/15/2016   K 4.6 11/15/2016   CL 102 11/15/2016   CO2 20 11/15/2016   BUN 18 11/15/2016   CREATININE 1.19 (H) 11/15/2016   GLUCOSE 92 11/15/2016   ALT 7 08/20/2016    Lab Results   Component Value Date   CHOL 205 (H) 08/20/2016   HDL 54.40 08/20/2016   LDLCALC 123 (H) 08/20/2016   LDLDIRECT 128.9 09/09/2012   TRIG 136.0 08/20/2016   CHOLHDL 4 08/20/2016    --------------------------------------------------------------------------------------------------  ASSESSMENT AND PLAN: Persistent atrial fibrillation Ms. Shankland remains in atrial fibrillation but is adequately rate-controlled. She is tolerating apixaban well and has been adequately anticoagulated for more than 1 month. I recommended that we proceed with elective cardioversion. However, her granddaughter is currently pregnant (high-risk) and is expected to deliver within the next month. Ms. Hartmann and her daughter would therefore like to defer DCCV until after the delivery. Given improving symptoms with adequate HR control, I think this is reasonable. We will not make any medication changes today. Ms. Ganger will contact us to arrange elective cardioversion at her convenience.  Hypertrophic cardiomyopathy Ms. Omalley appears euvolemic and well-compensated with NYHA class II symptoms. I still think that restoration of A-V synchrony would be beneficial. We will plan for DCCV, as above.  Follow-up: Return to clinic in 3 months.  Nelva Bush, MD  12/07/2016 9:38 AM

## 2017-01-05 ENCOUNTER — Encounter (HOSPITAL_COMMUNITY): Payer: Self-pay | Admitting: Cardiovascular Disease

## 2017-01-05 ENCOUNTER — Telehealth: Payer: Self-pay | Admitting: Internal Medicine

## 2017-01-05 NOTE — Telephone Encounter (Signed)
Pt recently had DCCV. Daughter calling with concern that patient is having diarrhea and is getting SOB when she sits up. She does not get SOB when laying down. No CP. Concerned if DCCV can make her SOb. Her BP and HR are normal. She skipped her lasix today since she is having diarrhea. Told her to weigh the patient and if her weight is up then give her lasix and if it is down then give her some fluid. Otherwise reassured her. If SOB continues, then will need further assessment in the ED. This was communicated to her and she demonstrated understanding.

## 2017-01-06 NOTE — Telephone Encounter (Signed)
[name], can you touch base with [name] to make sure that she is feeling better? If so, we should have her come in sometime this week for an EKG to make sure that she is still in sinus rhythm and then have her f/u with me in about a month. If she still feels unwell, can you try to get her in with one of the APP's this week? Thanks.

## 2017-01-07 NOTE — Telephone Encounter (Signed)
I spoke with patient, she is feeling better, no fever, no diarrhea since yesterday, continues to be a little short of breath. Today BP 118/48 HR 64. Pt is able to drink fluids, feels she is improving, maybe had a virus.  Pt has been scheduled to see Richardson Dopp, PA tomorrow at 2:15PM for EKG and follow up on DCCV/shortness of breath.

## 2017-01-08 ENCOUNTER — Ambulatory Visit (INDEPENDENT_AMBULATORY_CARE_PROVIDER_SITE_OTHER): Payer: Medicare Other | Admitting: Physician Assistant

## 2017-01-08 ENCOUNTER — Encounter: Payer: Self-pay | Admitting: Physician Assistant

## 2017-01-08 VITALS — BP 120/60 | HR 52 | Ht <= 58 in | Wt 157.6 lb

## 2017-01-08 DIAGNOSIS — A084 Viral intestinal infection, unspecified: Secondary | ICD-10-CM | POA: Diagnosis not present

## 2017-01-08 DIAGNOSIS — I10 Essential (primary) hypertension: Secondary | ICD-10-CM | POA: Diagnosis not present

## 2017-01-08 DIAGNOSIS — I4819 Other persistent atrial fibrillation: Secondary | ICD-10-CM

## 2017-01-08 DIAGNOSIS — I422 Other hypertrophic cardiomyopathy: Secondary | ICD-10-CM | POA: Diagnosis not present

## 2017-01-08 DIAGNOSIS — I481 Persistent atrial fibrillation: Secondary | ICD-10-CM | POA: Diagnosis not present

## 2017-01-08 NOTE — Patient Instructions (Signed)
Medication Instructions:  No changes.   Labwork: None   Testing/Procedures: None   Follow-Up: Dr. Harrell Gave End 02/25/17 as planned.   Any Other Special Instructions Will Be Listed Below (If Applicable). Eat some bland foods (bananas, rice, apples, toast) for a couple days. Drink plenty of fluids. If you still feel tired/fatigued in 1-2 weeks, call me so we can cut back on your Carvedilol dose.  If you need a refill on your cardiac medications before your next appointment, please call your pharmacy.

## 2017-01-08 NOTE — Progress Notes (Signed)
Cardiology Office Note:    Date:  01/08/2017   ID:  [name], DOB [birth year], MRN [id number]  PCP:  Amber Rail, MD  Cardiologist:  [name]    Referring MD: Amber Rail, MD   Chief Complaint  Patient presents with  . Atrial Fibrillation    s/p recent DCCV; also symptoms of diarrhea over the weekend     History of Present Illness:    [name] is a 83 y.o. female with a hx of Persistent AF, apical variant of hypertrophic cardiomyopathy, HTN, HL, hypothyroidism, CKD. She has a artery aneurysm and is followed closely by ophthalmology.  Last seen by [name] 7/18.  She was set up for elective cardioversion on 01/03/17. She called in over the weekend with complaints of diarrhea and shortness of breath.  [name] returns for close follow-up after recent cardioversion and recent symptoms of diarrhea and shortness of breath.  She had diarrhea, fever and chills over the weekend.  This has since resolved.  She did feel out of breath at times but this seems to be better.  She denies chest pain, syncope, near syncope, paroxysmal nocturnal dyspnea, edema.  She does feel fatigued.  Prior CV studies:   The following studies were reviewed today:  Echo (09/04/16):  Normal LV size with marked apical hypertrophy. LVEF 60-65%. No WMA. Mild MR. Moderate left atrial enlargement. Normal RV size and function. Mild right atrial enlargement.  Past Medical History:  Diagnosis Date  . Adrenal myelolipoma   . Atrial fibrillation ([place])   . DIVERTICULOSIS, COLON   . Dysmetabolic syndrome X   . FIBROMYALGIA   . Gastric ulcer due to Helicobacter pylori   . Hiatal hernia   . Hx of cataract surgery 01/08/2016  . HYPERLIPIDEMIA   . Hyperplastic colon polyp   . HYPERTENSION, ESSENTIAL NOS   . HYPERTROPHIC CARDIOMYOPATHY   . Hyponatremia   . Hypothyroidism   . MYCOSIS FUNGOIDES   . Nodule of left lung   . Normocytic anemia   . OSTEOARTHRITIS   . OSTEOPOROSIS   . Other abnormal  glucose   . Renal angiomyolipoma   . RENAL INSUFFICIENCY   . Unspecified vitamin D deficiency     Past Surgical History:  Procedure Laterality Date  . CARDIOVERSION N/A 01/03/2017   Procedure: CARDIOVERSION;  Surgeon: Amber Hector, MD;  Location: [facility] ENDOSCOPY;  Service: Cardiovascular;  Laterality: N/A;  . colonoscopy with polypectomy  07/25/2007   Dr [name], Recheck in 7 years for 2009  . G 4 P 2    . I&D EXTREMITY Right 01/08/2016   Procedure: IRRIGATION AND DEBRIDEMENT OF OLECRANON FRACTURE;  Surgeon: Amber Butters, MD;  Location: [place];  Service: Orthopedics;  Laterality: Right;  . ORIF ELBOW FRACTURE Right 01/08/2016   Procedure: OPEN REDUCTION INTERNAL FIXATION (ORIF) ELBOW/OLECRANON FRACTURE, NEUROLYSIS;  Surgeon: Amber Butters, MD;  Location: [place];  Service: Orthopedics;  Laterality: Right;    Current Medications: Current Meds  Medication Sig  . apixaban (ELIQUIS) 5 MG TABS tablet Take 1 tablet (5 mg total) by mouth 2 (two) times daily.  . beta carotene w/minerals (OCUVITE) tablet Take 1 tablet by mouth daily.  . carvedilol (COREG) 12.5 MG tablet Take 1 tablet (12.5 mg total) by mouth 2 (two) times daily.  . furosemide (LASIX) 20 MG tablet Take 1 tablet (20 mg total) by mouth daily.  [name] levothyroxine (SYNTHROID, LEVOTHROID) 75 MCG tablet TAKE 1 TAB BY MOUTH ONCE  DAILY WITH EXCEPTION OF TUESDAYS AND SATURDAYS TAKE HALF A TAB AS DIRECTED  . lisinopril (PRINIVIL,ZESTRIL) 20 MG tablet TAKE 1 TABLET (20 MG TOTAL) BY MOUTH DAILY.  [name] LORazepam (ATIVAN) 0.5 MG tablet TAKE 1/2 TAB BY MOUTH DAILY AS NEEDED FOR ANXIETY  . omeprazole (PRILOSEC) 20 MG capsule Take 1 capsule (20 mg total) by mouth daily.     Allergies:   Achromycin [tetracycline hcl]; Penicillins; Simvastatin; Celecoxib; Clarithromycin; Ezetimibe-simvastatin; Flagyl [metronidazole]; and Abilify [aripiprazole]   Social History   Social History  . Marital status: Married    Spouse name: N/A  . Number of children: 2    . Years of education: N/A   Occupational History  . Retired Retired   Social History Main Topics  . Smoking status: Former Smoker    Packs/day: 0.50    Years: 14.00    Quit date: 14  . Smokeless tobacco: Never Used     Comment: smoked 1951-1965 , up to 1/2 ppd  . Alcohol use No  . Drug use: No  . Sexual activity: Not Asked   Other Topics Concern  . None   Social History Narrative   Widowed.  Lives alone.  Ambulates without assistance.  Steady on feet.     Family Hx: The patient's family history includes Diabetes in her mother; Heart attack (age of onset: 8) in her maternal uncle; Hypertension in her maternal grandmother; Stroke (age of onset: 40) in her maternal grandmother. There is no history of Cancer, Asthma, or COPD.  ROS:   Please see the history of present illness.    Review of Systems  Constitution: Positive for chills and fever.  Cardiovascular: Positive for dyspnea on exertion.  Gastrointestinal: Positive for diarrhea.   All other systems reviewed and are negative.   EKGs/Labs/Other Test Reviewed:    EKG:  EKG is  ordered today.  The ekg ordered today demonstrates sinus bradycardia, HR 52, normal axis, nonspecific ST-T wave changes, no significant changes, QTc 416  Recent Labs: 08/20/2016: ALT 7; TSH 2.38 12/31/2016: BUN 20; Creatinine, Ser 1.19; Hemoglobin 10.4; Platelets 232; Potassium 4.5; Sodium 139   Recent Lipid Panel Lab Results  Component Value Date/Time   CHOL 205 (H) 08/20/2016 12:05 PM   TRIG 136.0 08/20/2016 12:05 PM   HDL 54.40 08/20/2016 12:05 PM   CHOLHDL 4 08/20/2016 12:05 PM   LDLCALC 123 (H) 08/20/2016 12:05 PM   LDLDIRECT 128.9 09/09/2012 11:29 AM    Physical Exam:    VS:  BP 120/60   Pulse (!) 52   Ht 4\' 9"  (1.448 m)   Wt 157 lb 9.6 oz (71.5 kg)   LMP  (LMP Unknown)   SpO2 95%   BMI 34.10 kg/m     Wt Readings from Last 3 Encounters:  01/08/17 157 lb 9.6 oz (71.5 kg)  01/03/17 156 lb (70.8 kg)  12/07/16 156 lb 3.2 oz  (70.9 kg)     Physical Exam  Constitutional: She is oriented to person, place, and time. She appears well-developed and well-nourished. No distress.  HENT:  Head: Normocephalic and atraumatic.  Eyes: No scleral icterus.  Neck: Normal range of motion. No JVD present.  Cardiovascular: Normal rate, regular rhythm, S1 normal, S2 normal and normal heart sounds.   No murmur heard. Pulmonary/Chest: Breath sounds normal. She has no wheezes. She has no rhonchi. She has no rales.  Abdominal: Soft. She exhibits no distension. There is no tenderness.  Musculoskeletal: She exhibits edema (trace bilat LE edema).  Neurological:  She is alert and oriented to person, place, and time.  Skin: Skin is warm and dry.  Psychiatric: She has a normal mood and affect.    ASSESSMENT:    1. Persistent atrial fibrillation ([place])   2. Viral gastroenteritis   3. Apical variant hypertrophic cardiomyopathy ([place])   4. Essential hypertension    PLAN:    In order of problems listed above:  1. Persistent atrial fibrillation ([place]) -  She is maintaining sinus rhythm after recent cardioversion.  CHADS2-VASc=4 (83 yo, HTN, female).  Continue long term anticoagulation with Apixaban.   If she continues to feel fatigued after resolution of her viral gastroenteritis, I will decrease her carvedilol to 9.375 mg twice a day.  2. Viral gastroenteritis She describes recent symptoms that are fairly consistent with viral gastritis. Her symptoms are slowly resolving.  3. Apical variant hypertrophic cardiomyopathy (H[facility]) - Overall, dyspnea is stable. Volumes remains stable.  4. Essential hypertension - The patient's blood pressure is controlled on her current regimen.  Continue current therapy.     Dispo:  Return in about 7 weeks (around 02/25/2017) for Scheduled Follow Up, w/ [name].   Medication Adjustments/Labs and Tests Ordered: Current medicines are reviewed at length with the patient today.  Concerns regarding medicines  are outlined above.  Tests Ordered: Orders Placed This Encounter  Procedures  . EKG 12-Lead   Medication Changes: No orders of the defined types were placed in this encounter.   Signed, Richardson Dopp, PA-C  01/08/2017 3:06 PM    [facility] [address], [place], [place]  [address] Phone: [phone]; Fax: [phone]

## 2017-01-11 ENCOUNTER — Telehealth: Payer: Self-pay | Admitting: Physician Assistant

## 2017-01-11 MED ORDER — CARVEDILOL 3.125 MG PO TABS: 3.1250 mg | ORAL_TABLET | ORAL | 3 refills | Status: DC

## 2017-01-11 NOTE — Telephone Encounter (Signed)
Decrease Carvedilol to 9.375 mg Twice daily  Richardson Dopp, PA-C    01/11/2017 1:44 PM

## 2017-01-11 NOTE — Telephone Encounter (Signed)
S/w pt in regards to her call earlier today, see earlier phone note. Pt advised per Brynda Rim. PA recommendation to decrease Coreg to 9.375 mg twice daily; Pt is agreeable to this and hopes that it will help her fatigue. I recommended sending in the 6.25 mg tablet with the directions to take 1 and 1/2 tabs BID. Pt states she does not want to have to cut any pills in 1/2. I will send in Coreg 3.125 mg tab with the directions to take 2 tabs in the morning and 1 tab in the PM. Pt thanked me for my call back today.

## 2017-01-11 NOTE — Telephone Encounter (Signed)
Patient called in and wants to know if you want to decrease her carvedilol dosage due to sob, no energy and fatigue.  Her cardiologist is Dr. Saunders Revel.  Please call her.

## 2017-01-11 NOTE — Telephone Encounter (Signed)
I will route to Richardson Dopp, [facility] for further advice. Per PA ov note 01/08/17 if pt still having fatigue, weakness can try decreasing Coreg to 9.375 mg BID. I will verify with provider as to plan of care.

## 2017-01-12 ENCOUNTER — Other Ambulatory Visit: Payer: Self-pay | Admitting: Physician Assistant

## 2017-01-12 ENCOUNTER — Other Ambulatory Visit: Payer: Self-pay | Admitting: Internal Medicine

## 2017-01-12 MED ORDER — CARVEDILOL 6.25 MG PO TABS: 6.2500 mg | ORAL_TABLET | Freq: Two times a day (BID) | ORAL | 3 refills | Status: DC

## 2017-01-12 NOTE — Progress Notes (Signed)
Discussed change in Carvedilol dose with CMA this AM. Patient was not comfortable breaking tablets in 1/2. Rx was to be sent in for 3.125 take 3 tabs bid. But, instructions were put in wrong on Rx. As patient is not comfortable breaking tabs in 1/2, I will just have her reduce the dose to 6.25 mg bid. I could not reach the patient by phone today. DPR on file - ok to leave message on voice mail for daughter Odetta Pink). I did leave a message with her daughter to change the dose to 6.25 mg bid. I left directions to take 3.125 mg take 2 tabs bid (if she would like) or she can just get the new Rx for the 6.25 mg tabs. I called her pharmacy and corrected the prescription as well. Richardson Dopp, PA-C    01/12/2017 11:47 AM

## 2017-01-12 NOTE — Telephone Encounter (Signed)
I spoke to CMA this AM. Pt not comfortable cutting tabs in 1/2.  Therefore, will just change dose to Coreg 6.25 mg bid.  Note, Rx sent in with incorrect directions for 3.125 mg tabs (Rx should read take 3 tabs bid but directions are for 2 tabs in AM and 1 tab in PM).  I left message on phone for daughter (DPR on file - ok to leave message).  She can take 2 of the 3.125 tabs bid or get new Rx for 6.25 tabs and take 1 bid. I have called her pharmacy and corrected the Rx.   Richardson Dopp, PA-C    01/12/2017 11:52 AM

## 2017-01-13 NOTE — Telephone Encounter (Signed)
Called daughter Odetta Pink) today. She confirmed that she did get the message yesterday. Ms. Sirianni is now taking Carvedilol 6.25 mg bid Richardson Dopp, PA-C    01/13/2017 4:15 PM

## 2017-01-14 NOTE — Telephone Encounter (Signed)
RX faxed to [facility] 

## 2017-01-14 NOTE — Telephone Encounter (Signed)
[place] Controlled Substance Database checked. Last filled on 10/08/16 

## 2017-01-15 ENCOUNTER — Ambulatory Visit: Payer: Self-pay | Admitting: Physician Assistant

## 2017-01-28 ENCOUNTER — Other Ambulatory Visit: Payer: Self-pay | Admitting: Internal Medicine

## 2017-01-30 DIAGNOSIS — Z23 Encounter for immunization: Secondary | ICD-10-CM | POA: Diagnosis not present

## 2017-02-08 ENCOUNTER — Other Ambulatory Visit: Payer: Self-pay | Admitting: Internal Medicine

## 2017-02-08 DIAGNOSIS — H3562 Retinal hemorrhage, left eye: Secondary | ICD-10-CM | POA: Diagnosis not present

## 2017-02-08 DIAGNOSIS — Z961 Presence of intraocular lens: Secondary | ICD-10-CM | POA: Diagnosis not present

## 2017-02-08 DIAGNOSIS — H35012 Changes in retinal vascular appearance, left eye: Secondary | ICD-10-CM | POA: Diagnosis not present

## 2017-02-08 DIAGNOSIS — H35361 Drusen (degenerative) of macula, right eye: Secondary | ICD-10-CM | POA: Diagnosis not present

## 2017-02-08 DIAGNOSIS — H353132 Nonexudative age-related macular degeneration, bilateral, intermediate dry stage: Secondary | ICD-10-CM | POA: Diagnosis not present

## 2017-02-08 DIAGNOSIS — H35362 Drusen (degenerative) of macula, left eye: Secondary | ICD-10-CM | POA: Diagnosis not present

## 2017-02-25 ENCOUNTER — Encounter: Payer: Self-pay | Admitting: Internal Medicine

## 2017-02-25 ENCOUNTER — Ambulatory Visit (INDEPENDENT_AMBULATORY_CARE_PROVIDER_SITE_OTHER): Payer: Medicare Other | Admitting: Internal Medicine

## 2017-02-25 VITALS — BP 124/74 | HR 59 | Ht <= 58 in | Wt 155.6 lb

## 2017-02-25 DIAGNOSIS — I422 Other hypertrophic cardiomyopathy: Secondary | ICD-10-CM | POA: Diagnosis not present

## 2017-02-25 DIAGNOSIS — I4819 Other persistent atrial fibrillation: Secondary | ICD-10-CM

## 2017-02-25 DIAGNOSIS — I10 Essential (primary) hypertension: Secondary | ICD-10-CM

## 2017-02-25 DIAGNOSIS — I481 Persistent atrial fibrillation: Secondary | ICD-10-CM

## 2017-02-25 NOTE — Patient Instructions (Signed)
Medication Instructions:  Your physician recommends that you continue on your current medications as directed. Please refer to the Current Medication list given to you today.   Labwork: None   Testing/Procedures: None   Follow-Up: Your physician wants you to follow-up in: 6 months with Dr End. (March 2019).  You will receive a reminder letter in the mail two months in advance. If you don't receive a letter, please call our office to schedule the follow-up appointment.        If you need a refill on your cardiac medications before your next appointment, please call your pharmacy.

## 2017-02-25 NOTE — Progress Notes (Signed)
Follow-up Outpatient Visit Date: 02/25/2017  Primary Care Provider: Binnie Rail, MD [place] [address]  Chief Complaint: Follow-up atrial fibrillation  HPI:  [name] is a 83 y.o. year-old female with history of persistent atrial fibrillation, hypertrophic cardiomyopathy (apical variant), hypertension, hyperlipidemia, hypothyroidism, and chronic kidney disease, who presents for follow-up of atrial fibrillation. I last saw her in July, at which time she was feeling relatively well. She noted rare flutters but otherwise was largely unaware of her atrial fibrillation. She underwent elective cardioversion early August with restoration of sinus rhythm. However, she developed gastroenteritis shortly thereafter and was seen for follow-up 5 days later by Richardson [name] due to fatigue.  Today, [name] reports improvement in her exertional dyspnea following DCCV. She continues to have some generalized fatigue at times. She denies chest pain palpitations, lightheadedness, orthopnea, and edema. She has been compliant with her medications, including apixaban.  --------------------------------------------------------------------------------------------------  Cardiovascular History & Procedures: Cardiovascular Problems:  Persistent atrial fibtillation  Apical HCM  Risk Factors:  HTN, hyperlipidemia, and age > 37  Cath/PCI:  None  CV Surgery:  None  EP Procedures and Devices:  DCCV (01/03/17)  Non-Invasive Evaluation(s):  TTE (09/04/16): Normal LV size with marked apical hypertrophy. LVEF 60-65%. No WMA. Mild MR. Moderate left atrial enlargement. Normal RV size and function. Mild right atrial enlargement.  Recent CV Pertinent Labs: Lab Results  Component Value Date   CHOL 205 (H) 08/20/2016   HDL 54.40 08/20/2016   LDLCALC 123 (H) 08/20/2016   LDLDIRECT 128.9 09/09/2012   TRIG 136.0 08/20/2016   CHOLHDL 4 08/20/2016   INR 1.1 12/31/2016   K 4.5 12/31/2016    BUN 20 12/31/2016   CREATININE 1.19 (H) 12/31/2016    Past medical and surgical history were reviewed and updated in EPIC.  Current Meds  Medication Sig  . apixaban (ELIQUIS) 5 MG TABS tablet Take 1 tablet (5 mg total) by mouth 2 (two) times daily.  . beta carotene w/minerals (OCUVITE) tablet Take 1 tablet by mouth daily.  . carvedilol (COREG) 6.25 MG tablet Take 1 tablet (6.25 mg total) by mouth 2 (two) times daily.  Marland Kitchen levothyroxine (SYNTHROID, LEVOTHROID) 75 MCG tablet TAKE 1 TAB BY MOUTH ONCE DAILY WITH EXCEPTION OF TUESDAYS AND SATURDAYS TAKE HALF A TAB AS DIRECTED  . lisinopril (PRINIVIL,ZESTRIL) 20 MG tablet Take 1 tablet (20 mg total) by mouth daily. -- Office visit needed for further refills  . LORazepam (ATIVAN) 0.5 MG tablet TAKE 1/2 TABLET BY MOUTH DAILY AS NEEDED FOR ANXIETY  . omeprazole (PRILOSEC) 20 MG capsule Take 1 capsule (20 mg total) by mouth daily.    Allergies: Achromycin [tetracycline hcl]; Penicillins; Simvastatin; Celecoxib; Clarithromycin; Ezetimibe-simvastatin; Flagyl [metronidazole]; and Abilify [aripiprazole]  Social History   Social History  . Marital status: Married    Spouse name: N/A  . Number of children: 2  . Years of education: N/A   Occupational History  . Retired Retired   Social History Main Topics  . Smoking status: Former Smoker    Packs/day: 0.50    Years: 14.00    Quit date: 78  . Smokeless tobacco: Never Used     Comment: smoked 1951-1965 , up to 1/2 ppd  . Alcohol use No  . Drug use: No  . Sexual activity: Not on file   Other Topics Concern  . Not on file   Social History Narrative   Widowed.  Lives alone.  Ambulates without assistance.  Steady on  feet.    Family History  Problem Relation Age of Onset  . Diabetes Mother   . Heart attack Maternal Uncle 22       his 2 sons had MI in 3s  . Hypertension Maternal Grandmother   . Stroke Maternal Grandmother 69  . Cancer Neg Hx   . Asthma Neg Hx   . COPD Neg Hx      Review of Systems: A 12-system review of systems was performed and was negative except as noted in the HPI.  --------------------------------------------------------------------------------------------------  Physical Exam: BP 124/74   Pulse (!) 59   Ht 4\' 9"  (1.448 m)   Wt 155 lb 9.6 oz (70.6 kg)   LMP  (LMP Unknown)   SpO2 98%   BMI 33.67 kg/m   General: Obese woman seated comfortably in the exam room. She is accompanied by her daughter. HEENT: No conjunctival pallor or scleral icterus. Moist mucous membranes.  OP clear. Neck: Supple without lymphadenopathy, thyromegaly, JVD, or HJR.  Lungs: Normal work of breathing. Clear to auscultation bilaterally without wheezes or crackles. Heart: Bradycardic but regular with 1/6 systolic murmur loudest at the apex. Abd: Bowel sounds present. Soft, NT/N. Ext: No lower extremity edema.  EKG:  Sinus bradycardia (HR 59 bpm) with lateral T-wave inversions. Heart rate has increased slightly since 01/08/17. Otherwise, there has been no significant change.  Lab Results  Component Value Date   WBC 4.7 12/31/2016   HGB 10.4 (L) 12/31/2016   HCT 31.9 (L) 12/31/2016   MCV 82 12/31/2016   PLT 232 12/31/2016    Lab Results  Component Value Date   NA 139 12/31/2016   K 4.5 12/31/2016   CL 104 12/31/2016   CO2 19 (L) 12/31/2016   BUN 20 12/31/2016   CREATININE 1.19 (H) 12/31/2016   GLUCOSE 106 (H) 12/31/2016   ALT 7 08/20/2016    Lab Results  Component Value Date   CHOL 205 (H) 08/20/2016   HDL 54.40 08/20/2016   LDLCALC 123 (H) 08/20/2016   LDLDIRECT 128.9 09/09/2012   TRIG 136.0 08/20/2016   CHOLHDL 4 08/20/2016    --------------------------------------------------------------------------------------------------  ASSESSMENT AND PLAN: Persistent atrial fibrillation Ms. Merlino has maintained sinus rhythm following DCCV in early August. We will continue her current medications, including indefinite anticoagulation with  apixaban.  Apical HCM Ms. Liscano appears euvolemic with NYHA class II symptoms. We discussed switching from a beta blocker to a calcium channel blocker, given continued fatigue. However, since the fatigue is tolerable, we will not make any changes today.  Hypertension Blood pressure is well-controlled today. No medication changes at this time.  Follow-up: Return to clinic in 6 months.  Nelva Bush, MD 02/25/2017 5:52 PM

## 2017-02-27 ENCOUNTER — Ambulatory Visit (INDEPENDENT_AMBULATORY_CARE_PROVIDER_SITE_OTHER): Payer: Medicare Other | Admitting: Adult Health

## 2017-02-27 ENCOUNTER — Encounter: Payer: Self-pay | Admitting: Adult Health

## 2017-02-27 VITALS — BP 156/60 | Temp 98.6°F | Ht 59.0 in | Wt 157.0 lb

## 2017-02-27 DIAGNOSIS — M25562 Pain in left knee: Secondary | ICD-10-CM | POA: Diagnosis not present

## 2017-02-27 NOTE — Progress Notes (Signed)
Subjective:    Patient ID: [name], female    DOB: 1927/12/07, 83 y.o.   MRN: [id number]  HPI  83 year old female who  has a past medical history of Adrenal myelolipoma; Atrial fibrillation ([place]); DIVERTICULOSIS, COLON; Dysmetabolic syndrome X; FIBROMYALGIA; Gastric ulcer due to Helicobacter pylori; Hiatal hernia; cataract surgery (01/08/2016); HYPERLIPIDEMIA; Hyperplastic colon polyp; HYPERTENSION, ESSENTIAL NOS; HYPERTROPHIC CARDIOMYOPATHY; Hyponatremia; Hypothyroidism; MYCOSIS FUNGOIDES; Nodule of left lung; Normocytic anemia; OSTEOARTHRITIS; OSTEOPOROSIS; Other abnormal glucose; Renal angiomyolipoma; RENAL INSUFFICIENCY; and Unspecified vitamin D deficiency.  She is a patient of Dr. Quay Burow who I am seeing today for an acute issue of left leg pain. She reports for the last week she has been experiencing left knee pain. Today when she was going down the stairs she experienced worse pain. The pain is felt as a " catching pain" on the medial side of her left knee. Pain is worse with standing and extension of the left leg. She denies any trauma or falls. Has not noticed any bruising over the last week. Has not had any swelling, redness, or warmth.   She has been taking Tylenol without relief   Review of Systems See HPI   Past Medical History:  Diagnosis Date  . Adrenal myelolipoma   . Atrial fibrillation ([place])   . DIVERTICULOSIS, COLON   . Dysmetabolic syndrome X   . FIBROMYALGIA   . Gastric ulcer due to Helicobacter pylori   . Hiatal hernia   . Hx of cataract surgery 01/08/2016  . HYPERLIPIDEMIA   . Hyperplastic colon polyp   . HYPERTENSION, ESSENTIAL NOS   . HYPERTROPHIC CARDIOMYOPATHY   . Hyponatremia   . Hypothyroidism   . MYCOSIS FUNGOIDES   . Nodule of left lung   . Normocytic anemia   . OSTEOARTHRITIS   . OSTEOPOROSIS   . Other abnormal glucose   . Renal angiomyolipoma   . RENAL INSUFFICIENCY   . Unspecified vitamin D deficiency     Social History   Social  History  . Marital status: Married    Spouse name: N/A  . Number of children: 2  . Years of education: N/A   Occupational History  . Retired Retired   Social History Main Topics  . Smoking status: Former Smoker    Packs/day: 0.50    Years: 14.00    Quit date: 5  . Smokeless tobacco: Never Used     Comment: smoked 1951-1965 , up to 1/2 ppd  . Alcohol use No  . Drug use: No  . Sexual activity: Not on file   Other Topics Concern  . Not on file   Social History Narrative   Widowed.  Lives alone.  Ambulates without assistance.  Steady on feet.    Past Surgical History:  Procedure Laterality Date  . CARDIOVERSION N/A 01/03/2017   Procedure: CARDIOVERSION;  Surgeon: Josue Hector, MD;  Location: [facility] ENDOSCOPY;  Service: Cardiovascular;  Laterality: N/A;  . colonoscopy with polypectomy  07/25/2007   Dr Olevia Perches, Recheck in 7 years for 2009  . G 4 P 2    . I&D EXTREMITY Right 01/08/2016   Procedure: IRRIGATION AND DEBRIDEMENT OF OLECRANON FRACTURE;  Surgeon: Renette Butters, MD;  Location: [facility];  Service: Orthopedics;  Laterality: Right;  . ORIF ELBOW FRACTURE Right 01/08/2016   Procedure: OPEN REDUCTION INTERNAL FIXATION (ORIF) ELBOW/OLECRANON FRACTURE, NEUROLYSIS;  Surgeon: Renette Butters, MD;  Location: [facility];  Service: Orthopedics;  Laterality: Right;    Family History  Problem Relation Age of Onset  . Diabetes Mother   . Heart attack Maternal Uncle 91       his 2 sons had MI in 41s  . Hypertension Maternal Grandmother   . Stroke Maternal Grandmother 69  . Cancer Neg Hx   . Asthma Neg Hx   . COPD Neg Hx     Allergies  Allergen Reactions  . Achromycin [Tetracycline Hcl] Swelling and Other (See Comments)    Reaction:  Facial swelling   . Penicillins Anaphylaxis and Other (See Comments)    Has patient had a PCN reaction causing immediate rash, facial/tongue/throat swelling, SOB or lightheadedness with hypotension: Yes Has patient had a PCN reaction causing severe  rash involving mucus membranes or skin necrosis: No Has patient had a PCN reaction that required hospitalization No Has patient had a PCN reaction occurring within the last 10 years: No If all of the above answers are "NO", then may proceed with Cephalosporin use.  . Simvastatin Other (See Comments)    Reaction:  Muscle pain   . Celecoxib Diarrhea  . Clarithromycin Other (See Comments)    Reaction:  Unknown   . Ezetimibe-Simvastatin Diarrhea, Nausea And Vomiting and Other (See Comments)    Reaction:  Muscle pain   . Flagyl [Metronidazole] Other (See Comments)    Reaction:  Unknown   . Abilify [Aripiprazole] Other (See Comments)    Reaction:  Insomnia     Current Outpatient Prescriptions on File Prior to Visit  Medication Sig Dispense Refill  . apixaban (ELIQUIS) 5 MG TABS tablet Take 1 tablet (5 mg total) by mouth 2 (two) times daily. 60 tablet 6  . beta carotene w/minerals (OCUVITE) tablet Take 1 tablet by mouth daily.    . carvedilol (COREG) 6.25 MG tablet Take 1 tablet (6.25 mg total) by mouth 2 (two) times daily. 180 tablet 3  . levothyroxine (SYNTHROID, LEVOTHROID) 75 MCG tablet TAKE 1 TAB BY MOUTH ONCE DAILY WITH EXCEPTION OF TUESDAYS AND SATURDAYS TAKE HALF A TAB AS DIRECTED 72 tablet 0  . lisinopril (PRINIVIL,ZESTRIL) 20 MG tablet Take 1 tablet (20 mg total) by mouth daily. -- Office visit needed for further refills 90 tablet 0  . LORazepam (ATIVAN) 0.5 MG tablet TAKE 1/2 TABLET BY MOUTH DAILY AS NEEDED FOR ANXIETY 10 tablet 0  . omeprazole (PRILOSEC) 20 MG capsule Take 1 capsule (20 mg total) by mouth daily. 90 capsule 3  . furosemide (LASIX) 20 MG tablet Take 1 tablet (20 mg total) by mouth daily. 90 tablet 1   No current facility-administered medications on file prior to visit.     BP (!) 156/60 (BP Location: Left Arm)   Temp 98.6 F (37 C) (Oral)   Ht 4\' 11"  (1.499 m)   Wt 157 lb (71.2 kg)   LMP  (LMP Unknown)   BMI 31.71 kg/m       Objective:   Physical Exam    Constitutional: She is oriented to person, place, and time. She appears well-developed and well-nourished. No distress.  Cardiovascular: Normal rate, regular rhythm, normal heart sounds and intact distal pulses.  Exam reveals no gallop and no friction rub.   No murmur heard. Pulmonary/Chest: Effort normal and breath sounds normal. No respiratory distress. She has no wheezes. She has no rales. She exhibits no tenderness.  Musculoskeletal: Normal range of motion. She exhibits tenderness. She exhibits no edema or deformity.       Left knee: She exhibits bony tenderness. She exhibits no swelling,  no effusion, no ecchymosis, no deformity, no erythema, normal alignment, no LCL laxity and no MCL laxity. Tenderness found. Medial joint line and MCL tenderness noted. No lateral joint line and no LCL tenderness noted.  No redness, warmth, or bruising noted. No signs of DVT or apparent bakers cyst.   Neurological: She is alert and oriented to person, place, and time.  Skin: Skin is warm and dry. No rash noted. She is not diaphoretic. No erythema. No pallor.  Psychiatric: She has a normal mood and affect. Her behavior is normal. Judgment and thought content normal.  Vitals reviewed.     Assessment & Plan:  1. Acute pain of left knee - Appears to be possible MCL strain. No concern for DVT. Little suspicion for meniscus tear.   Advised rest, compression, and ice.  - She in on Eliquis so I do not recommend motrin at this time  - Follow up with PCP if no improvement in the next 2-3 days  - Consider imaging at that time   Dorothyann Peng, NP  -

## 2017-03-15 ENCOUNTER — Ambulatory Visit: Payer: Self-pay | Admitting: Internal Medicine

## 2017-03-25 DIAGNOSIS — M1712 Unilateral primary osteoarthritis, left knee: Secondary | ICD-10-CM | POA: Diagnosis not present

## 2017-03-31 ENCOUNTER — Other Ambulatory Visit: Payer: Self-pay | Admitting: Internal Medicine

## 2017-04-01 NOTE — Telephone Encounter (Signed)
RX faxed to [facility] 

## 2017-04-01 NOTE — Telephone Encounter (Signed)
[place] Controlled Substance Database checked. Last filled on 01/14/17

## 2017-04-05 DIAGNOSIS — L820 Inflamed seborrheic keratosis: Secondary | ICD-10-CM | POA: Diagnosis not present

## 2017-04-05 DIAGNOSIS — Z85828 Personal history of other malignant neoplasm of skin: Secondary | ICD-10-CM | POA: Diagnosis not present

## 2017-04-05 DIAGNOSIS — L570 Actinic keratosis: Secondary | ICD-10-CM | POA: Diagnosis not present

## 2017-04-05 DIAGNOSIS — D485 Neoplasm of uncertain behavior of skin: Secondary | ICD-10-CM | POA: Diagnosis not present

## 2017-04-06 ENCOUNTER — Other Ambulatory Visit: Payer: Self-pay | Admitting: Internal Medicine

## 2017-04-06 DIAGNOSIS — I4819 Other persistent atrial fibrillation: Secondary | ICD-10-CM

## 2017-04-08 NOTE — Telephone Encounter (Signed)
Please review for refill, thanks ! 

## 2017-04-08 NOTE — Telephone Encounter (Signed)
Pt is female, wt 71.2 kg, age 83, serum creatinine 1.19, creatinine clearance 37.44.  Based on this, appropriate dose of Eliquis is 2.5 mg BID.  Pt is aware of change in rx.

## 2017-04-15 ENCOUNTER — Other Ambulatory Visit: Payer: Self-pay

## 2017-04-15 ENCOUNTER — Telehealth: Payer: Self-pay | Admitting: Internal Medicine

## 2017-04-15 ENCOUNTER — Other Ambulatory Visit: Payer: Self-pay | Admitting: Internal Medicine

## 2017-04-15 DIAGNOSIS — I4819 Other persistent atrial fibrillation: Secondary | ICD-10-CM

## 2017-04-15 MED ORDER — APIXABAN 2.5 MG PO TABS: 2.5000 mg | ORAL_TABLET | Freq: Two times a day (BID) | ORAL | 6 refills | Status: DC

## 2017-04-15 MED ORDER — APIXABAN 2.5 MG PO TABS
2.5000 mg | ORAL_TABLET | Freq: Two times a day (BID) | ORAL | 3 refills | Status: DC
Start: 2017-04-15 — End: 2017-04-15

## 2017-04-15 MED ORDER — APIXABAN 5 MG PO TABS: 5.0000 mg | ORAL_TABLET | Freq: Two times a day (BID) | ORAL | 6 refills | Status: DC

## 2017-04-15 NOTE — Telephone Encounter (Signed)
Dr End has reviewed with pharmacist-recommends pt take Eliquis 5 mg bid not Eliquis 2.5mg  bid. I have discussed with patient, she verbalizes understanding, I have spoken with [facility] at [facility] [place] and they are aware pt should be on Eliquis 5 mg bid.

## 2017-04-15 NOTE — Telephone Encounter (Signed)
Close  

## 2017-04-15 NOTE — Telephone Encounter (Signed)
Patient calling in reference to Eliquis medication. Patient states that she was previously taking the 5 mg of eliquis but the dosage to 2.5 mg and the price increased to $170.  Patient would like to know if she can have Eliquis 5 mg refilled and she can just cut the pill in half.

## 2017-04-15 NOTE — Telephone Encounter (Signed)
Pt is aware I  will mail her an application for [employer] Patient Assistance to compete and return to [place] in our office, and have provided her the phone number to call them also [phone].

## 2017-04-15 NOTE — Telephone Encounter (Signed)
Pt is female, wt 71.2 kg, age 83, serum creatinine 1.19, creatinine clearance of 37.44.  Appropriate dose is 2.5 mg BID. Attempted to contact pt, but her line was busy x 3 attempts.  Please have her call the office w/ any questions.

## 2017-04-15 NOTE — Telephone Encounter (Signed)
Please review for refill, Thanks !  

## 2017-04-17 ENCOUNTER — Ambulatory Visit (INDEPENDENT_AMBULATORY_CARE_PROVIDER_SITE_OTHER): Payer: Medicare Other | Admitting: Family Medicine

## 2017-04-17 ENCOUNTER — Encounter: Payer: Self-pay | Admitting: Family Medicine

## 2017-04-17 VITALS — BP 134/78 | HR 67 | Temp 97.7°F | Ht 59.0 in | Wt 152.0 lb

## 2017-04-17 DIAGNOSIS — R197 Diarrhea, unspecified: Secondary | ICD-10-CM | POA: Diagnosis not present

## 2017-04-17 NOTE — Patient Instructions (Signed)
Thank you for coming in,   Please let me know by Friday if you're not feeling any better and we can order some lab work and an Insurance account manager.    Please feel free to call with any questions or concerns at any time, at [phone]. --Dr. Raeford Razor

## 2017-04-17 NOTE — Progress Notes (Signed)
[name] - 83 y.o. female MRN [id number]  Date of birth: 09-29-1927  SUBJECTIVE:  Including CC & ROS.  Chief Complaint  Patient presents with  . Diarrhea    Started on 04/12/17-it has not improved with immodium-she did vomit twice on different days. Denies abdominal pain and blood in her stool.     [name] is a 83 y.o. female that is  is presenting with 5 days of intermittent diarrhea. It is watery in nature and nonbloody. She denies any fevers or chills or abdominal pain. She denies any abdominal surgeries. As I had any changes in her medications. She's not had any loose bowel movements today. 2 days ago her symptoms were severe in nature. She has had a lack of an appetite. Has taken Imodium with some improvement. Has been drinking a lot of water and Gatorade.  Review of the CT abdomen pelvis from 2015 shows a left adrenal myolipoma and a left lower lobe lung nodule.   Review of Systems  Constitutional: Negative for chills and fever.  Cardiovascular: Negative for chest pain.  Gastrointestinal: Positive for diarrhea and nausea. Negative for abdominal pain.  Genitourinary: Negative for dysuria.    HISTORY: Past Medical, Surgical, Social, and Family History Reviewed & Updated per EMR.   Pertinent Historical Findings include:  Past Medical History:  Diagnosis Date  . Adrenal myelolipoma   . Atrial fibrillation ([place])   . DIVERTICULOSIS, COLON   . Dysmetabolic syndrome X   . FIBROMYALGIA   . Gastric ulcer due to Helicobacter pylori   . Hiatal hernia   . Hx of cataract surgery 01/08/2016  . HYPERLIPIDEMIA   . Hyperplastic colon polyp   . HYPERTENSION, ESSENTIAL NOS   . HYPERTROPHIC CARDIOMYOPATHY   . Hyponatremia   . Hypothyroidism   . MYCOSIS FUNGOIDES   . Nodule of left lung   . Normocytic anemia   . OSTEOARTHRITIS   . OSTEOPOROSIS   . Other abnormal glucose   . Renal angiomyolipoma   . RENAL INSUFFICIENCY   . Unspecified vitamin D deficiency     Past Surgical  History:  Procedure Laterality Date  . colonoscopy with polypectomy  07/25/2007   Dr Olevia Perches, Recheck in 7 years for 2009  . G 4 P 2      Allergies  Allergen Reactions  . Achromycin [Tetracycline Hcl] Swelling and Other (See Comments)    Reaction:  Facial swelling   . Penicillins Anaphylaxis and Other (See Comments)    Has patient had a PCN reaction causing immediate rash, facial/tongue/throat swelling, SOB or lightheadedness with hypotension: Yes Has patient had a PCN reaction causing severe rash involving mucus membranes or skin necrosis: No Has patient had a PCN reaction that required hospitalization No Has patient had a PCN reaction occurring within the last 10 years: No If all of the above answers are "NO", then may proceed with Cephalosporin use.  . Simvastatin Other (See Comments)    Reaction:  Muscle pain   . Celecoxib Diarrhea  . Clarithromycin Other (See Comments)    Reaction:  Unknown   . Ezetimibe-Simvastatin Diarrhea, Nausea And Vomiting and Other (See Comments)    Reaction:  Muscle pain   . Flagyl [Metronidazole] Other (See Comments)    Reaction:  Unknown   . Abilify [Aripiprazole] Other (See Comments)    Reaction:  Insomnia     Family History  Problem Relation Age of Onset  . Diabetes Mother   . Heart attack Maternal Uncle 70  his 2 sons had MI in 88s  . Hypertension Maternal Grandmother   . Stroke Maternal Grandmother 69  . Cancer Neg Hx   . Asthma Neg Hx   . COPD Neg Hx      Social History   Socioeconomic History  . Marital status: Married    Spouse name: Not on file  . Number of children: 2  . Years of education: Not on file  . Highest education level: Not on file  Social Needs  . Financial resource strain: Not on file  . Food insecurity - worry: Not on file  . Food insecurity - inability: Not on file  . Transportation needs - medical: Not on file  . Transportation needs - non-medical: Not on file  Occupational History  . Occupation:  Retired    Fish farm manager: RETIRED  Tobacco Use  . Smoking status: Former Smoker    Packs/day: 0.50    Years: 14.00    Pack years: 7.00    Last attempt to quit: 1956    Years since quitting: 62.9  . Smokeless tobacco: Never Used  . Tobacco comment: smoked 1951-1965 , up to 1/2 ppd  Substance and Sexual Activity  . Alcohol use: No    Alcohol/week: 0.0 oz  . Drug use: No  . Sexual activity: Not on file  Other Topics Concern  . Not on file  Social History Narrative   Widowed.  Lives alone.  Ambulates without assistance.  Steady on feet.     PHYSICAL EXAM:  VS: BP 134/78 (BP Location: Left Arm, Patient Position: Sitting, Cuff Size: Normal)   Pulse 67   Temp 97.7 F (36.5 C) (Oral)   Ht 4\' 11"  (1.499 m)   Wt 152 lb (68.9 kg)   LMP  (LMP Unknown)   SpO2 100%   BMI 30.70 kg/m  Physical Exam Gen: NAD, alert, cooperative with exam, well-appearing ENT: normal lips, normal nasal mucosa,  Eye: normal EOM, normal conjunctiva and lids CV:  no edema, +2 pedal pulses, S1-S2   Resp: no accessory muscle use, non-labored, clear to auscultation bilaterally, no crackles or wheezes GI: no masses or tenderness, no hernia, soft, nondistended, normal bowel sounds  Skin: no rashes, no areas of induration  Neuro: normal tone, normal sensation to touch Psych:  normal insight, alert and oriented MSK: Normal strength, normal gait      ASSESSMENT & PLAN:   Diarrhea This sounds to be self resolving at the moment. Possible for viral in nature. - She will call back on Friday. If is still occurring can order a stool culture, abdominal x-ray, and CBC. - If suggestive more of a constipation picture can send senna.

## 2017-04-17 NOTE — Assessment & Plan Note (Signed)
This sounds to be self resolving at the moment. Possible for viral in nature. - She will call back on Friday. If is still occurring can order a stool culture, abdominal x-ray, and CBC. - If suggestive more of a constipation picture can send senna.

## 2017-05-08 ENCOUNTER — Other Ambulatory Visit: Payer: Self-pay | Admitting: Emergency Medicine

## 2017-05-08 MED ORDER — LISINOPRIL 20 MG PO TABS: 20.0000 mg | ORAL_TABLET | Freq: Every day | ORAL | 0 refills | Status: DC

## 2017-05-09 ENCOUNTER — Other Ambulatory Visit: Payer: Self-pay | Admitting: Internal Medicine

## 2017-05-09 NOTE — Telephone Encounter (Signed)
[place] Controlled Substance Database checked. Last filled on 04/01/17 

## 2017-05-09 NOTE — Progress Notes (Signed)
Subjective:    Patient ID: [name], female    DOB: [birth year], 83 y.o.   MRN: [id number]  HPI The patient is here for follow up.  Hypertension: She is taking her medication daily. She is compliant with a low sodium diet.  She denies chest pain, palpitations, daily edema, frequent shortness of breath and regular headaches. She is not exercising regularly.      Afib:  She is taking eliquis and coreg. She has been out of Afib.  She denies palpitations.    Hypothyroidism:  She is taking her medication daily.  She denies any recent changes in energy or weight that are unexplained.  Her energy level is lower overall, but there has been no change.   GERD:  She is taking her medication daily as prescribed.  She denies any GERD symptoms and feels her GERD is well controlled.   CKD:  She is drinking a good amount of water during the day.  She does not take any nsaids.  Anxiety:  She takes the ativan as needed. She does not take it often and the medication helps her.    Medications and allergies reviewed with patient and updated if appropriate.  Patient Active Problem List   Diagnosis Date Noted  . Diarrhea 04/17/2017  . Persistent atrial fibrillation ([place]) 08/20/2016  . GERD (gastroesophageal reflux disease) 02/20/2016  . Right leg pain 01/20/2016  . Left hip pain 01/20/2016  . Peripheral edema 01/20/2016  . Systolic murmur 40/98/1191  . Olecranon fracture 01/07/2016  . Depression 07/27/2013  . Generalized weakness 07/18/2013  . Anxiety and depression 07/18/2013  . Nodule of left lung 07/18/2013  . Normocytic anemia 07/18/2013  . Apical variant hypertrophic cardiomyopathy ([place]) 08/23/2009  . ABNORMAL ELECTROCARDIOGRAM 07/18/2009  . Unspecified vitamin D deficiency 01/21/2009  . Chronic kidney disease (CKD), stage III (moderate) ([place]) 01/21/2009  . History of colonic polyps 01/21/2009  . ANEMIA-NOS 01/21/2009  . MYCOSIS FUNGOIDES 05/21/2008  . DIVERTICULOSIS, COLON  05/21/2008  . OSTEOARTHRITIS 06/02/2007  . Hypothyroidism 04/28/2007  . HYPERLIPIDEMIA 04/28/2007  . METABOLIC SYNDROME X 47/82/9562  . Essential hypertension 04/28/2007  . Osteoporosis 04/28/2007  . FIBROMYALGIA 11/14/2006    Current Outpatient Medications on File Prior to Visit  Medication Sig Dispense Refill  . apixaban (ELIQUIS) 5 MG TABS tablet Take 1 tablet (5 mg total) 2 (two) times daily by mouth. 60 tablet 6  . beta carotene w/minerals (OCUVITE) tablet Take 1 tablet by mouth daily.    . carvedilol (COREG) 6.25 MG tablet Take 1 tablet (6.25 mg total) by mouth 2 (two) times daily. 180 tablet 3  . levothyroxine (SYNTHROID, LEVOTHROID) 75 MCG tablet TAKE 1 TAB BY MOUTH ONCE DAILY WITH EXCEPTION OF TUESDAYS AND SATURDAYS TAKE HALF A TAB AS DIRECTED 72 tablet 0  . lisinopril (PRINIVIL,ZESTRIL) 20 MG tablet Take 1 tablet (20 mg total) by mouth daily. 90 tablet 0  . LORazepam (ATIVAN) 0.5 MG tablet TAKE 1/2 A TABLET BY MOUTH DAILY AS NEEDED FOR ANXIETY 10 tablet 0  . omeprazole (PRILOSEC) 20 MG capsule Take 1 capsule (20 mg total) by mouth daily. 90 capsule 3   No current facility-administered medications on file prior to visit.     Past Medical History:  Diagnosis Date  . Adrenal myelolipoma   . Atrial fibrillation ([place])   . DIVERTICULOSIS, COLON   . Dysmetabolic syndrome X   . FIBROMYALGIA   . Gastric ulcer due to Helicobacter pylori   . Hiatal hernia   .  Hx of cataract surgery 01/08/2016  . HYPERLIPIDEMIA   . Hyperplastic colon polyp   . HYPERTENSION, ESSENTIAL NOS   . HYPERTROPHIC CARDIOMYOPATHY   . Hyponatremia   . Hypothyroidism   . MYCOSIS FUNGOIDES   . Nodule of left lung   . Normocytic anemia   . OSTEOARTHRITIS   . OSTEOPOROSIS   . Other abnormal glucose   . Renal angiomyolipoma   . RENAL INSUFFICIENCY   . Unspecified vitamin D deficiency     Past Surgical History:  Procedure Laterality Date  . CARDIOVERSION N/A 01/03/2017   Procedure: CARDIOVERSION;   Surgeon: Josue Hector, MD;  Location: [facility] ENDOSCOPY;  Service: Cardiovascular;  Laterality: N/A;  . colonoscopy with polypectomy  07/25/2007   Dr Olevia Perches, Recheck in 7 years for 2009  . G 4 P 2    . I&D EXTREMITY Right 01/08/2016   Procedure: IRRIGATION AND DEBRIDEMENT OF OLECRANON FRACTURE;  Surgeon: Renette Butters, MD;  Location: [place];  Service: Orthopedics;  Laterality: Right;  . ORIF ELBOW FRACTURE Right 01/08/2016   Procedure: OPEN REDUCTION INTERNAL FIXATION (ORIF) ELBOW/OLECRANON FRACTURE, NEUROLYSIS;  Surgeon: Renette Butters, MD;  Location: [place];  Service: Orthopedics;  Laterality: Right;    Social History   Socioeconomic History  . Marital status: Married    Spouse name: None  . Number of children: 2  . Years of education: None  . Highest education level: None  Social Needs  . Financial resource strain: None  . Food insecurity - worry: None  . Food insecurity - inability: None  . Transportation needs - medical: None  . Transportation needs - non-medical: None  Occupational History  . Occupation: Retired    Fish farm manager: RETIRED  Tobacco Use  . Smoking status: Former Smoker    Packs/day: 0.50    Years: 14.00    Pack years: 7.00    Last attempt to quit: 1956    Years since quitting: 62.9  . Smokeless tobacco: Never Used  . Tobacco comment: smoked 1951-1965 , up to 1/2 ppd  Substance and Sexual Activity  . Alcohol use: No    Alcohol/week: 0.0 oz  . Drug use: No  . Sexual activity: None  Other Topics Concern  . None  Social History Narrative   Widowed.  Lives alone.  Ambulates without assistance.  Steady on feet.    Family History  Problem Relation Age of Onset  . Diabetes Mother   . Heart attack Maternal Uncle 30       his 2 sons had MI in 22s  . Hypertension Maternal Grandmother   . Stroke Maternal Grandmother 69  . Cancer Neg Hx   . Asthma Neg Hx   . COPD Neg Hx     Review of Systems  Constitutional: Negative for chills and fever.  Respiratory:  Positive for shortness of breath (soemtimes, after bending over and getting up). Negative for cough and wheezing.   Cardiovascular: Positive for leg swelling (sometimes at end of day). Negative for chest pain and palpitations.  Neurological: Negative for dizziness, light-headedness and headaches.       Objective:   Vitals:   05/10/17 0952  BP: (!) 150/60  Pulse: (!) 59  Resp: 16  Temp: 97.9 F (36.6 C)  SpO2: 98%   Wt Readings from Last 3 Encounters:  05/10/17 152 lb (68.9 kg)  04/17/17 152 lb (68.9 kg)  02/27/17 157 lb (71.2 kg)   Body mass index is 30.7 kg/m.   Physical Exam  Constitutional: Appears well-developed and well-nourished. No distress.  HENT:  Head: Normocephalic and atraumatic.  Neck: Neck supple. No tracheal deviation present. No thyromegaly present.  No cervical lymphadenopathy Cardiovascular: Normal rate, regular rhythm and normal heart sounds.   No murmur heard. No carotid bruit .  Trace b/l LE edema Pulmonary/Chest: Effort normal and breath sounds normal. No respiratory distress. No has no wheezes. No rales.  Skin: Skin is warm and dry. Not diaphoretic.  Psychiatric: Normal mood and affect. Behavior is normal.      Assessment & Plan:    See Problem List for Assessment and Plan of chronic medical problems.

## 2017-05-10 ENCOUNTER — Other Ambulatory Visit (INDEPENDENT_AMBULATORY_CARE_PROVIDER_SITE_OTHER): Payer: Medicare Other

## 2017-05-10 ENCOUNTER — Ambulatory Visit (INDEPENDENT_AMBULATORY_CARE_PROVIDER_SITE_OTHER): Payer: Medicare Other | Admitting: Internal Medicine

## 2017-05-10 ENCOUNTER — Encounter: Payer: Self-pay | Admitting: Internal Medicine

## 2017-05-10 VITALS — BP 134/60 | HR 59 | Temp 97.9°F | Resp 16 | Wt 152.0 lb

## 2017-05-10 DIAGNOSIS — R609 Edema, unspecified: Secondary | ICD-10-CM

## 2017-05-10 DIAGNOSIS — R739 Hyperglycemia, unspecified: Secondary | ICD-10-CM | POA: Diagnosis not present

## 2017-05-10 DIAGNOSIS — I481 Persistent atrial fibrillation: Secondary | ICD-10-CM

## 2017-05-10 DIAGNOSIS — E039 Hypothyroidism, unspecified: Secondary | ICD-10-CM

## 2017-05-10 DIAGNOSIS — I4819 Other persistent atrial fibrillation: Secondary | ICD-10-CM

## 2017-05-10 DIAGNOSIS — K219 Gastro-esophageal reflux disease without esophagitis: Secondary | ICD-10-CM | POA: Diagnosis not present

## 2017-05-10 DIAGNOSIS — F329 Major depressive disorder, single episode, unspecified: Secondary | ICD-10-CM | POA: Diagnosis not present

## 2017-05-10 DIAGNOSIS — I10 Essential (primary) hypertension: Secondary | ICD-10-CM

## 2017-05-10 DIAGNOSIS — F419 Anxiety disorder, unspecified: Secondary | ICD-10-CM | POA: Diagnosis not present

## 2017-05-10 LAB — COMPREHENSIVE METABOLIC PANEL
ALK PHOS: 66 U/L (ref 39–117)
ALT: 6 U/L (ref 0–35)
AST: 11 U/L (ref 0–37)
Albumin: 4 g/dL (ref 3.5–5.2)
BUN: 14 mg/dL (ref 6–23)
CHLORIDE: 104 meq/L (ref 96–112)
CO2: 26 meq/L (ref 19–32)
Calcium: 9 mg/dL (ref 8.4–10.5)
Creatinine, Ser: 0.97 mg/dL (ref 0.40–1.20)
GFR: 57.7 mL/min — AB (ref >60.00)
GLUCOSE: 96 mg/dL (ref 70–99)
POTASSIUM: 4.3 meq/L (ref 3.5–5.1)
Sodium: 137 mEq/L (ref 135–145)
Total Bilirubin: 0.4 mg/dL (ref 0.2–1.2)
Total Protein: 6.5 g/dL (ref 6.0–8.3)

## 2017-05-10 LAB — CBC WITH DIFFERENTIAL/PLATELET
BASOS PCT: 1 % (ref 0.0–3.0)
Basophils Absolute: 0.1 10*3/uL (ref 0.0–0.1)
EOS PCT: 3.4 % (ref 0.0–5.0)
Eosinophils Absolute: 0.2 10*3/uL (ref 0.0–0.7)
HCT: 27.8 % — ABNORMAL LOW (ref 36.0–46.0)
HEMOGLOBIN: 9.1 g/dL — AB (ref 12.0–15.0)
Lymphocytes Relative: 24 % (ref 12.0–46.0)
Lymphs Abs: 1.4 10*3/uL (ref 0.7–4.0)
MCHC: 32.8 g/dL (ref 30.0–36.0)
MCV: 82.2 fl (ref 78.0–100.0)
MONOS PCT: 10 % (ref 3.0–12.0)
Monocytes Absolute: 0.6 10*3/uL (ref 0.1–1.0)
Neutro Abs: 3.5 10*3/uL (ref 1.4–7.7)
Neutrophils Relative %: 61.6 % (ref 43.0–77.0)
Platelets: 248 10*3/uL (ref 150.0–400.0)
RBC: 3.39 Mil/uL — AB (ref 3.87–5.11)
RDW: 16.2 % — ABNORMAL HIGH (ref 11.5–15.5)
WBC: 5.7 10*3/uL (ref 4.0–10.5)

## 2017-05-10 LAB — LIPID PANEL
Cholesterol: 201 mg/dL — ABNORMAL HIGH (ref 0–200)
HDL: 59.6 mg/dL (ref >39.00)
LDL CALC: 116 mg/dL — AB (ref 0–99)
NONHDL: 141.4
Total CHOL/HDL Ratio: 3
Triglycerides: 125 mg/dL (ref 0.0–149.0)
VLDL: 25 mg/dL (ref 0.0–40.0)

## 2017-05-10 LAB — TSH: TSH: 1.56 u[IU]/mL (ref 0.35–4.50)

## 2017-05-10 LAB — HEMOGLOBIN A1C: HEMOGLOBIN A1C: 5.8 % (ref 4.6–6.5)

## 2017-05-10 NOTE — Assessment & Plan Note (Signed)
BP well controlled Current regimen effective and well tolerated Continue current medications at current doses cmp  

## 2017-05-10 NOTE — Assessment & Plan Note (Signed)
GERD controlled Continue daily medication  

## 2017-05-10 NOTE — Assessment & Plan Note (Signed)
Check tsh  Titrate med dose if needed  

## 2017-05-10 NOTE — Patient Instructions (Signed)

## 2017-05-10 NOTE — Assessment & Plan Note (Signed)
Mild, at end of day - not daily  No longer on lasix Elevated legs during the day

## 2017-05-10 NOTE — Assessment & Plan Note (Signed)
Check a1c 

## 2017-05-10 NOTE — Assessment & Plan Note (Signed)
Anxiety more than depression Takes ativan as needed only and it works well continue

## 2017-05-10 NOTE — Assessment & Plan Note (Signed)
In sinus s/p cardioversion Following with cardio Continue current medications On eliquis - check cbc

## 2017-05-12 ENCOUNTER — Other Ambulatory Visit: Payer: Self-pay | Admitting: Internal Medicine

## 2017-05-12 DIAGNOSIS — D649 Anemia, unspecified: Secondary | ICD-10-CM

## 2017-06-01 ENCOUNTER — Other Ambulatory Visit: Payer: Self-pay | Admitting: Internal Medicine

## 2017-06-03 NOTE — Telephone Encounter (Signed)
[place] Controlled Substance Database checked. Last filled on 05/09/2017, 20 day supply

## 2017-06-10 DIAGNOSIS — H43811 Vitreous degeneration, right eye: Secondary | ICD-10-CM | POA: Diagnosis not present

## 2017-06-10 DIAGNOSIS — H353124 Nonexudative age-related macular degeneration, left eye, advanced atrophic with subfoveal involvement: Secondary | ICD-10-CM | POA: Diagnosis not present

## 2017-06-10 DIAGNOSIS — H35012 Changes in retinal vascular appearance, left eye: Secondary | ICD-10-CM | POA: Diagnosis not present

## 2017-06-10 DIAGNOSIS — H353112 Nonexudative age-related macular degeneration, right eye, intermediate dry stage: Secondary | ICD-10-CM | POA: Diagnosis not present

## 2017-06-14 ENCOUNTER — Other Ambulatory Visit (INDEPENDENT_AMBULATORY_CARE_PROVIDER_SITE_OTHER): Payer: Medicare Other

## 2017-06-14 DIAGNOSIS — D649 Anemia, unspecified: Secondary | ICD-10-CM

## 2017-06-14 LAB — CBC WITH DIFFERENTIAL/PLATELET
BASOS ABS: 0 10*3/uL (ref 0.0–0.1)
BASOS PCT: 0.8 % (ref 0.0–3.0)
EOS ABS: 0.1 10*3/uL (ref 0.0–0.7)
Eosinophils Relative: 2.2 % (ref 0.0–5.0)
HEMATOCRIT: 28.1 % — AB (ref 36.0–46.0)
HEMOGLOBIN: 9 g/dL — AB (ref 12.0–15.0)
LYMPHS PCT: 25.1 % (ref 12.0–46.0)
Lymphs Abs: 1.5 10*3/uL (ref 0.7–4.0)
MCHC: 31.9 g/dL (ref 30.0–36.0)
MCV: 81.2 fl (ref 78.0–100.0)
MONO ABS: 0.6 10*3/uL (ref 0.1–1.0)
Monocytes Relative: 10.3 % (ref 3.0–12.0)
Neutro Abs: 3.6 10*3/uL (ref 1.4–7.7)
Neutrophils Relative %: 61.6 % (ref 43.0–77.0)
Platelets: 257 10*3/uL (ref 150.0–400.0)
RBC: 3.46 Mil/uL — ABNORMAL LOW (ref 3.87–5.11)
RDW: 16 % — AB (ref 11.5–15.5)
WBC: 5.9 10*3/uL (ref 4.0–10.5)

## 2017-06-14 LAB — IRON: IRON: 35 ug/dL — AB (ref 42–145)

## 2017-06-14 LAB — FERRITIN: FERRITIN: 8.5 ng/mL — AB (ref 10.0–291.0)

## 2017-06-25 ENCOUNTER — Other Ambulatory Visit: Payer: Self-pay | Admitting: Internal Medicine

## 2017-06-26 NOTE — Telephone Encounter (Signed)
[place] Controlled Substance Database checked. Last filled on 06/03/17 

## 2017-06-26 NOTE — Telephone Encounter (Signed)
RX faxed to [facility] 

## 2017-07-01 DIAGNOSIS — H26492 Other secondary cataract, left eye: Secondary | ICD-10-CM | POA: Diagnosis not present

## 2017-07-01 DIAGNOSIS — H353132 Nonexudative age-related macular degeneration, bilateral, intermediate dry stage: Secondary | ICD-10-CM | POA: Diagnosis not present

## 2017-07-01 DIAGNOSIS — H35033 Hypertensive retinopathy, bilateral: Secondary | ICD-10-CM | POA: Diagnosis not present

## 2017-07-01 DIAGNOSIS — Z961 Presence of intraocular lens: Secondary | ICD-10-CM | POA: Diagnosis not present

## 2017-07-31 ENCOUNTER — Other Ambulatory Visit: Payer: Self-pay | Admitting: Internal Medicine

## 2017-08-06 ENCOUNTER — Other Ambulatory Visit: Payer: Self-pay | Admitting: Internal Medicine
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
## 2017-08-07 MED ORDER — LORAZEPAM 0.5 MG PO TABS: ORAL_TABLET | ORAL | 0 refills | Status: DC

## 2017-08-07 NOTE — Telephone Encounter (Signed)
Check  registry last filled 07/25/2017.Marland KitchenJohny [name]

## 2017-08-07 NOTE — Telephone Encounter (Signed)
Lorazepam 0.5 mg  LOV: 05/10/17  PCP: Quay Burow  Pharmacy: verified

## 2017-08-08 NOTE — Telephone Encounter (Signed)
Notified pt MD sent new rx to [facility] 

## 2017-08-28 ENCOUNTER — Encounter: Payer: Self-pay | Admitting: Internal Medicine

## 2017-09-03 ENCOUNTER — Other Ambulatory Visit: Payer: Self-pay | Admitting: Internal Medicine

## 2017-09-03 DIAGNOSIS — I422 Other hypertrophic cardiomyopathy: Secondary | ICD-10-CM

## 2017-09-03 DIAGNOSIS — I4819 Other persistent atrial fibrillation: Secondary | ICD-10-CM

## 2017-09-04 NOTE — Telephone Encounter (Signed)
Please review for refill. Thanks!  

## 2017-09-04 NOTE — Telephone Encounter (Signed)
Please advise for medication request LASIX. Medication was removed due to completed therapy on 04/17/2017. ??

## 2017-09-09 ENCOUNTER — Ambulatory Visit (INDEPENDENT_AMBULATORY_CARE_PROVIDER_SITE_OTHER): Payer: Medicare Other | Admitting: Internal Medicine

## 2017-09-09 ENCOUNTER — Encounter: Payer: Self-pay | Admitting: Internal Medicine

## 2017-09-09 VITALS — BP 152/68 | HR 66 | Ht 59.0 in | Wt 151.0 lb

## 2017-09-09 DIAGNOSIS — D509 Iron deficiency anemia, unspecified: Secondary | ICD-10-CM | POA: Diagnosis not present

## 2017-09-09 DIAGNOSIS — I481 Persistent atrial fibrillation: Secondary | ICD-10-CM

## 2017-09-09 DIAGNOSIS — I10 Essential (primary) hypertension: Secondary | ICD-10-CM | POA: Diagnosis not present

## 2017-09-09 DIAGNOSIS — I422 Other hypertrophic cardiomyopathy: Secondary | ICD-10-CM | POA: Diagnosis not present

## 2017-09-09 DIAGNOSIS — I4819 Other persistent atrial fibrillation: Secondary | ICD-10-CM

## 2017-09-09 DIAGNOSIS — R0602 Shortness of breath: Secondary | ICD-10-CM | POA: Diagnosis not present

## 2017-09-09 DIAGNOSIS — R0609 Other forms of dyspnea: Secondary | ICD-10-CM

## 2017-09-09 LAB — BASIC METABOLIC PANEL
BUN / CREAT RATIO: 17 (ref 12–28)
BUN: 17 mg/dL (ref 8–27)
CHLORIDE: 100 mmol/L (ref 96–106)
CO2: 20 mmol/L (ref 20–29)
CREATININE: 1.02 mg/dL — AB (ref 0.57–1.00)
Calcium: 9.4 mg/dL (ref 8.7–10.3)
GFR calc non Af Amer: 50 mL/min/{1.73_m2} — ABNORMAL LOW (ref >59)
GFR, EST AFRICAN AMERICAN: 57 mL/min/{1.73_m2} — AB (ref >59)
Glucose: 98 mg/dL (ref 65–99)
Potassium: 4.5 mmol/L (ref 3.5–5.2)
SODIUM: 136 mmol/L (ref 134–144)

## 2017-09-09 MED ORDER — LISINOPRIL 30 MG PO TABS: ORAL_TABLET | ORAL | 3 refills | Status: DC

## 2017-09-09 NOTE — Progress Notes (Signed)
Follow-up Outpatient Visit Date: 09/09/2017  Primary Care Provider: Binnie Rail, MD [place] [address]  Chief Complaint: Shortness of breath with walking  HPI:  [name] is a 84 y.o. year-old female with history of persistent atrial fibrillation, hypertrophic cardiomyopathy (apical variant), hypertension, hyperlipidemia, hypothyroidism, and chronic kidney disease, who presents for follow-up of atrial fibrillation and HCM.  I last saw [name] in September, 2018, at which time she was doing well other than occasional generalized fatigue.  She reported less exertional dyspnea following DCCV in August, 2018.  Today, [name] reports that she has been feeling more short of breath with activities such as walking down her driveway.  This began over the last few months.  She denies edema and has been used furosemide every other day.  She denies chest pain and lightheadedness.  Home blood pressures have been running 335K-562B systolic.  She denies bleeding and remains compliant with apixaban.  She has not had chest pain, palpitations, and orthopnea.  --------------------------------------------------------------------------------------------------  Cardiovascular History & Procedures: Cardiovascular Problems:  Persistent atrial fibtillation  Apical HCM  Risk Factors:  HTN, hyperlipidemia, and age > 65  Cath/PCI:  None  CV Surgery:  None  EP Procedures and Devices:  DCCV (01/03/17)  Non-Invasive Evaluation(s):  TTE (09/04/16): Normal LV size with marked apical hypertrophy. LVEF 60-65%. No WMA. Mild MR. Moderate left atrial enlargement. Normal RV size and function. Mild right atrial enlargement.   Recent CV Pertinent Labs: Lab Results  Component Value Date   CHOL 201 (H) 05/10/2017   HDL 59.60 05/10/2017   LDLCALC 116 (H) 05/10/2017   LDLDIRECT 128.9 09/09/2012   TRIG 125.0 05/10/2017   CHOLHDL 3 05/10/2017   INR 1.1 12/31/2016   K 4.5 09/09/2017     BUN 17 09/09/2017   CREATININE 1.02 (H) 09/09/2017    Past medical and surgical history were reviewed and updated in EPIC.  Current Meds  Medication Sig  . apixaban (ELIQUIS) 5 MG TABS tablet Take 1 tablet (5 mg total) 2 (two) times daily by mouth.  . beta carotene w/minerals (OCUVITE) tablet Take 1 tablet by mouth daily.  . carvedilol (COREG) 6.25 MG tablet Take 1 tablet (6.25 mg total) by mouth 2 (two) times daily.  . ferrous sulfate 325 (65 FE) MG EC tablet Take 325 mg by mouth daily.  Marland Kitchen levothyroxine (SYNTHROID, LEVOTHROID) 75 MCG tablet TAKE 1 TAB BY MOUTH ONCE DAILY WITH EXCEPTION OF TUESDAYS AND SATURDAYS TAKE HALF A TAB AS DIRECTED  . LORazepam (ATIVAN) 0.5 MG tablet TAKE 1/2 TABLET BY MOUTH DAILY AS NEEDED FOR ANXIETY  . omeprazole (PRILOSEC) 20 MG capsule TAKE ONE CAPSULE BY MOUTH DAILY  . [DISCONTINUED] lisinopril (PRINIVIL,ZESTRIL) 20 MG tablet Take 1 tablet (20 mg total) by mouth daily. -- Office visit needed for further refill    Allergies: Achromycin [tetracycline hcl]; Penicillins; Simvastatin; Celecoxib; Clarithromycin; Ezetimibe-simvastatin; Flagyl [metronidazole]; and Abilify [aripiprazole]  Social History   Socioeconomic History  . Marital status: Married    Spouse name: Not on file  . Number of children: 2  . Years of education: Not on file  . Highest education level: Not on file  Occupational History  . Occupation: Retired    Fish farm manager: RETIRED  Social Needs  . Financial resource strain: Not on file  . Food insecurity:    Worry: Not on file    Inability: Not on file  . Transportation needs:    Medical: Not on file  Non-medical: Not on file  Tobacco Use  . Smoking status: Former Smoker    Packs/day: 0.50    Years: 14.00    Pack years: 7.00    Last attempt to quit: 1956    Years since quitting: 63.3  . Smokeless tobacco: Never Used  . Tobacco comment: smoked 1951-1965 , up to 1/2 ppd  Substance and Sexual Activity  . Alcohol use: No     Alcohol/week: 0.0 oz  . Drug use: No  . Sexual activity: Not on file  Lifestyle  . Physical activity:    Days per week: Not on file    Minutes per session: Not on file  . Stress: Not on file  Relationships  . Social connections:    Talks on phone: Not on file    Gets together: Not on file    Attends religious service: Not on file    Active member of club or organization: Not on file    Attends meetings of clubs or organizations: Not on file    Relationship status: Not on file  . Intimate partner violence:    Fear of current or ex partner: Not on file    Emotionally abused: Not on file    Physically abused: Not on file    Forced sexual activity: Not on file  Other Topics Concern  . Not on file  Social History Narrative   Widowed.  Lives alone.  Ambulates without assistance.  Steady on feet.    Family History  Problem Relation Age of Onset  . Diabetes Mother   . Heart attack Maternal Uncle 67       his 2 sons had MI in 18s  . Hypertension Maternal Grandmother   . Stroke Maternal Grandmother 69  . Cancer Neg Hx   . Asthma Neg Hx   . COPD Neg Hx     Review of Systems: A 12-system review of systems was performed and was negative except as noted in the HPI.  --------------------------------------------------------------------------------------------------  Physical Exam: BP (!) 152/68   Pulse 66   Ht 4\' 11"  (1.499 m)   Wt 151 lb (68.5 kg)   LMP  (LMP Unknown)   SpO2 97%   BMI 30.50 kg/m   General: NAD.  She is accompanied by her daughter. HEENT: No conjunctival pallor or scleral icterus. Moist mucous membranes.  OP clear. Neck: Supple without lymphadenopathy, thyromegaly, JVD, or HJR. Lungs: Normal work of breathing. Clear to auscultation bilaterally without wheezes or crackles. Heart: Regular rate and rhythm without murmurs, rubs, or gallops. Non-displaced PMI. Abd: Bowel sounds present. Soft, NT/ND without hepatosplenomegaly Ext: No lower extremity edema.  Radial, PT, and DP pulses are 2+ bilaterally. Skin: Warm and dry without rash.  EKG: Sinus bradycardia (heart rate 59 bpm) with nonspecific T wave abnormality.  Lab Results  Component Value Date   WBC 5.9 06/14/2017   HGB 9.0 (L) 06/14/2017   HCT 28.1 (L) 06/14/2017   MCV 81.2 06/14/2017   PLT 257.0 06/14/2017    Lab Results  Component Value Date   NA 136 09/09/2017   K 4.5 09/09/2017   CL 100 09/09/2017   CO2 20 09/09/2017   BUN 17 09/09/2017   CREATININE 1.02 (H) 09/09/2017   GLUCOSE 98 09/09/2017   ALT 6 05/10/2017    Lab Results  Component Value Date   CHOL 201 (H) 05/10/2017   HDL 59.60 05/10/2017   LDLCALC 116 (H) 05/10/2017   LDLDIRECT 128.9 09/09/2012   TRIG 125.0 05/10/2017  CHOLHDL 3 05/10/2017    --------------------------------------------------------------------------------------------------  ASSESSMENT AND PLAN: Persistent atrial fibrillation [name] is maintaining sinus rhythm following cardioversion last summer.  We have agreed to continue her current medications including indefinite anticoagulation with apixaban unless her anemia worsens.  I will check a CBC when she returns in 2 weeks, given recent anemia.  Dyspnea on exertion I suspect this is multifactorial including chronic diastolic heart failure as well as iron deficiency anemia.  Given that she has been taking her iron supplement sporadically, I have encouraged her to take it daily as prescribed.  I will repeat a CBC when she returns for labs in 2 weeks.  I also encouraged her to speak with her PCP about a GI evaluation.  [name] appears euvolemic on exam today.  We will continue with furosemide 20 mg every other day or as needed for swelling.  Anemia As above, I am concerned that iron deficiency anemia may be contributing to her dyspnea.  We will continue apixaban for now, though if her hemoglobin drops further we will need to consider discontinuing this.  Encouraged [name] to ferrous  sulfate daily, as recommended by Dr. Quay Burow.  Essential hypertension Blood pressure is suboptimally controlled today and at home.  We will increase lisinopril to 30 mg daily, with a BMP today and in about 2 weeks.  Apical hypertrophic cardiomyopathy [name] appears euvolemic on exam with adequate heart rate control and furosemide, as above.  Follow-up: Return to clinic in 3 months.  Nelva Bush, MD 09/10/2017 8:23 PM

## 2017-09-09 NOTE — Patient Instructions (Signed)
Medication Instructions:   INCREASE Lisinopril to 30 mg by mouth daily   -- If you need a refill on your cardiac medications before your next appointment, please call your pharmacy. --  Labwork: BMET    TODAY   Testing/Procedures: None ordered  Follow-Up: Your physician wants you to follow-up in: 3 MONTHS with Dr. Saunders Revel.     Thank you for choosing [facility]!!    Any Other Special Instructions Will Be Listed Below (If Applicable).  PLEASE Schedule BP CHECK Nurse Visit 2 weeks  ANOTHER BMET IN 2 weeks

## 2017-09-10 ENCOUNTER — Encounter: Payer: Self-pay | Admitting: Internal Medicine

## 2017-09-10 DIAGNOSIS — R0602 Shortness of breath: Secondary | ICD-10-CM | POA: Insufficient documentation

## 2017-09-10 DIAGNOSIS — D509 Iron deficiency anemia, unspecified: Secondary | ICD-10-CM | POA: Insufficient documentation

## 2017-09-11 ENCOUNTER — Other Ambulatory Visit: Payer: Self-pay

## 2017-09-11 DIAGNOSIS — I429 Cardiomyopathy, unspecified: Secondary | ICD-10-CM

## 2017-09-11 DIAGNOSIS — I4819 Other persistent atrial fibrillation: Secondary | ICD-10-CM

## 2017-09-11 MED ORDER — FUROSEMIDE 20 MG PO TABS: ORAL_TABLET | ORAL | 3 refills | Status: DC

## 2017-09-23 ENCOUNTER — Ambulatory Visit (INDEPENDENT_AMBULATORY_CARE_PROVIDER_SITE_OTHER): Payer: Medicare Other

## 2017-09-23 ENCOUNTER — Other Ambulatory Visit: Payer: Medicare Other | Admitting: *Deleted

## 2017-09-23 ENCOUNTER — Other Ambulatory Visit: Payer: Self-pay

## 2017-09-23 VITALS — BP 153/66 | HR 61 | Ht <= 58 in | Wt 151.0 lb

## 2017-09-23 DIAGNOSIS — D509 Iron deficiency anemia, unspecified: Secondary | ICD-10-CM

## 2017-09-23 DIAGNOSIS — E785 Hyperlipidemia, unspecified: Secondary | ICD-10-CM

## 2017-09-23 DIAGNOSIS — I10 Essential (primary) hypertension: Secondary | ICD-10-CM

## 2017-09-23 LAB — CBC
HEMATOCRIT: 27.8 % — AB (ref 34.0–46.6)
HEMOGLOBIN: 9.1 g/dL — AB (ref 11.1–15.9)
MCH: 25.8 pg — AB (ref 26.6–33.0)
MCHC: 32.7 g/dL (ref 31.5–35.7)
MCV: 79 fL (ref 79–97)
Platelets: 296 10*3/uL (ref 150–379)
RBC: 3.53 x10E6/uL — ABNORMAL LOW (ref 3.77–5.28)
RDW: 18.4 % — ABNORMAL HIGH (ref 12.3–15.4)
WBC: 6.1 10*3/uL (ref 3.4–10.8)

## 2017-09-23 LAB — BASIC METABOLIC PANEL
BUN / CREAT RATIO: 12 (ref 12–28)
BUN: 12 mg/dL (ref 8–27)
CO2: 21 mmol/L (ref 20–29)
CREATININE: 0.98 mg/dL (ref 0.57–1.00)
Calcium: 9.1 mg/dL (ref 8.7–10.3)
Chloride: 98 mmol/L (ref 96–106)
GFR, EST AFRICAN AMERICAN: 60 mL/min/{1.73_m2} (ref >59)
GFR, EST NON AFRICAN AMERICAN: 52 mL/min/{1.73_m2} — AB (ref >59)
Glucose: 89 mg/dL (ref 65–99)
Potassium: 4.7 mmol/L (ref 3.5–5.2)
Sodium: 132 mmol/L — ABNORMAL LOW (ref 134–144)

## 2017-09-23 MED ORDER — LISINOPRIL 40 MG PO TABS: 40.0000 mg | ORAL_TABLET | Freq: Every day | ORAL | 3 refills | Status: DC

## 2017-09-23 NOTE — Progress Notes (Signed)
BP 153/66 Automatic BP 150/ 68 Manual  HR 61 bpm

## 2017-09-23 NOTE — Progress Notes (Signed)
We will f/u labs and increase lisinopril to 40 mg daily tomorrow, as renal function and potassium allow.  Nelva Bush, MD [facility] Pager: [phone]

## 2017-09-23 NOTE — Patient Instructions (Addendum)
Medication Instructions:  Your physician recommends that you continue on your current medications as directed. Please refer to the Current Medication list given to you today.  -- If you need a refill on your cardiac medications before your next appointment, please call your pharmacy. --  Labwork: CBC/BMP  Testing/Procedures: None ordered  Follow-Up: Your physician wants you to follow-up as scheduled with Dr. Saunders Revel.    Thank you for choosing [facility]!!    Any Other Special Instructions Will Be Listed Below (If Applicable).   WE WILL CALL with recommendations.Marland Kitchen

## 2017-09-24 ENCOUNTER — Other Ambulatory Visit: Payer: Self-pay

## 2017-09-24 ENCOUNTER — Telehealth: Payer: Self-pay | Admitting: Internal Medicine

## 2017-09-24 DIAGNOSIS — Z79899 Other long term (current) drug therapy: Secondary | ICD-10-CM

## 2017-09-24 NOTE — Telephone Encounter (Signed)
New Message   Pt states she returning a call for nurse

## 2017-09-26 ENCOUNTER — Telehealth: Payer: Self-pay | Admitting: Emergency Medicine

## 2017-09-26 NOTE — Telephone Encounter (Signed)
Called patient to schedule AWV. Patient did not answer and no voicemail available.

## 2017-09-27 ENCOUNTER — Ambulatory Visit: Payer: Self-pay

## 2017-09-27 ENCOUNTER — Other Ambulatory Visit: Payer: Self-pay

## 2017-10-07 ENCOUNTER — Other Ambulatory Visit: Payer: Medicare Other | Admitting: *Deleted

## 2017-10-07 DIAGNOSIS — Z79899 Other long term (current) drug therapy: Secondary | ICD-10-CM

## 2017-10-07 LAB — BASIC METABOLIC PANEL
BUN/Creatinine Ratio: 12 (ref 12–28)
BUN: 13 mg/dL (ref 8–27)
CALCIUM: 9.1 mg/dL (ref 8.7–10.3)
CO2: 21 mmol/L (ref 20–29)
Chloride: 95 mmol/L — ABNORMAL LOW (ref 96–106)
Creatinine, Ser: 1.06 mg/dL — ABNORMAL HIGH (ref 0.57–1.00)
GFR calc Af Amer: 55 mL/min/{1.73_m2} — ABNORMAL LOW (ref >59)
GFR, EST NON AFRICAN AMERICAN: 47 mL/min/{1.73_m2} — AB (ref >59)
Glucose: 96 mg/dL (ref 65–99)
Potassium: 4.5 mmol/L (ref 3.5–5.2)
Sodium: 131 mmol/L — ABNORMAL LOW (ref 134–144)

## 2017-10-27 ENCOUNTER — Other Ambulatory Visit: Payer: Self-pay | Admitting: Internal Medicine

## 2017-10-29 NOTE — Telephone Encounter (Signed)
Please review for refill, Thanks !  

## 2017-10-30 ENCOUNTER — Other Ambulatory Visit: Payer: Self-pay | Admitting: Internal Medicine

## 2017-10-30 NOTE — Telephone Encounter (Signed)
[place] Controlled Substance Database checked. Last filled on 08/25/17

## 2017-11-14 NOTE — Progress Notes (Signed)
Subjective:    Patient ID: [name], female    DOB: Jan 05, 1928, 84 y.o.   MRN: [id number]  HPI The patient is here for follow up.  She has no energy:  She noticed this in when her afib started.  Around that time she also started to become less active because it was just after a fall where she broke her arm.  She stopped driving at bedtime and does not get out as much.  Her breathing is labored - walking to the end of the driveway she feels tired and SOB.  Again, this is nothing new and it started around the time of having no energy.  She does feel good when she gets up to the grocery store and can walk as long as she has a cart to hang onto.  She does not get out much and is not doing much exercise.  She does not feel that the breathing has changed acutely and does not seem like it is getting worse.  She is not having any leg swelling.  She denies any shortness of breath at rest.  She sees cardio next month.   Afib, Hypertension: She is taking her medication daily, except the lasix - she takes 1/2 pill only as needed - took it last 2 weeks ago. She is compliant with a low sodium diet.  She denies chest pain, palpitations, edema, shortness of breath and regular headaches. She is exercising regularly.  She does monitor her blood pressure at home - 157/?, 136/? .    Hypothyroidism:  She is taking her medication daily.  She denies any recent changes in energy or weight that are unexplained.  She states fatigue that has been continuous for a while.  GERD:  She is taking her medication daily as prescribed.  She denies any GERD symptoms and feels her GERD is well controlled.   Iron def anemia:  She is taking a slow release iron pill daily.    Decreased hearing: When she came in today she was experiencing decreased hearing.  Her ears were obstructed with cerumen bilaterally.  They were cleaned out and she states her hearing has improved.  She has not had any ear pain or cold  symptoms.  Anxiety: She is taking her medication  - 1/2 at night prn. She denies any side effects from the medication. She feels medication works well when she takes it.  Bladder prolapse - saw urology and he discussed with her that the only options were a vaginal ring were surgery.  He did not recommend surgery.  She would like to get a second opinion possibly from a gynecologist.     Medications and allergies reviewed with patient and updated if appropriate.  Patient Active Problem List   Diagnosis Date Noted  . Iron deficiency anemia 09/10/2017  . Dyspnea on exertion 09/10/2017  . Hyperglycemia 05/10/2017  . Diarrhea 04/17/2017  . Persistent atrial fibrillation ([place]) 08/20/2016  . GERD (gastroesophageal reflux disease) 02/20/2016  . Right leg pain 01/20/2016  . Left hip pain 01/20/2016  . Peripheral edema 01/20/2016  . Systolic murmur 62/95/2841  . Olecranon fracture 01/07/2016  . Depression 07/27/2013  . Anxiety and depression 07/18/2013  . Nodule of left lung 07/18/2013  . Apical variant hypertrophic cardiomyopathy ([place]) 08/23/2009  . Unspecified vitamin D deficiency 01/21/2009  . Chronic kidney disease (CKD), stage III (moderate) ([place]) 01/21/2009  . History of colonic polyps 01/21/2009  . MYCOSIS FUNGOIDES 05/21/2008  . DIVERTICULOSIS, COLON 05/21/2008  .  OSTEOARTHRITIS 06/02/2007  . Hypothyroidism 04/28/2007  . HYPERLIPIDEMIA 04/28/2007  . METABOLIC SYNDROME X 28/31/5176  . Essential hypertension 04/28/2007  . Osteoporosis 04/28/2007  . FIBROMYALGIA 11/14/2006    Current Outpatient Medications on File Prior to Visit  Medication Sig Dispense Refill  . beta carotene w/minerals (OCUVITE) tablet Take 1 tablet by mouth daily.    . carvedilol (COREG) 6.25 MG tablet Take 1 tablet (6.25 mg total) by mouth 2 (two) times daily. 180 tablet 3  . ELIQUIS 5 MG TABS tablet TAKE 1 TABLET (5 MG TOTAL) BY MOUTH 2 TIMES DAILY 60 tablet 6  . ferrous sulfate 325 (65 FE) MG EC tablet  Take 325 mg by mouth daily.    . furosemide (LASIX) 20 MG tablet Take 1 tablet 20 mg by mouth every other day 90 tablet 3  . levothyroxine (SYNTHROID, LEVOTHROID) 75 MCG tablet TAKE 1 TAB BY MOUTH ONCE DAILY WITH EXCEPTION OF TUESDAYS AND SATURDAYS TAKE HALF A TAB AS DIRECTED 72 tablet 1  . lisinopril (PRINIVIL,ZESTRIL) 40 MG tablet Take 1 tablet (40 mg total) by mouth daily. 90 tablet 3  . LORazepam (ATIVAN) 0.5 MG tablet TAKE 1/2 TABLET BY MOUTH DAILY AS NEEDED FOR ANXIETY 30 tablet 0  . omeprazole (PRILOSEC) 20 MG capsule TAKE ONE CAPSULE BY MOUTH DAILY 90 capsule 3   No current facility-administered medications on file prior to visit.     Past Medical History:  Diagnosis Date  . Adrenal myelolipoma   . Atrial fibrillation ([place])   . DIVERTICULOSIS, COLON   . Dysmetabolic syndrome X   . FIBROMYALGIA   . Gastric ulcer due to Helicobacter pylori   . Hiatal hernia   . Hx of cataract surgery 01/08/2016  . HYPERLIPIDEMIA   . Hyperplastic colon polyp   . HYPERTENSION, ESSENTIAL NOS   . HYPERTROPHIC CARDIOMYOPATHY   . Hyponatremia   . Hypothyroidism   . MYCOSIS FUNGOIDES   . Nodule of left lung   . Normocytic anemia   . OSTEOARTHRITIS   . OSTEOPOROSIS   . Other abnormal glucose   . Renal angiomyolipoma   . RENAL INSUFFICIENCY   . Unspecified vitamin D deficiency     Past Surgical History:  Procedure Laterality Date  . CARDIOVERSION N/A 01/03/2017   Procedure: CARDIOVERSION;  Surgeon: Josue Hector, MD;  Location: [facility] ENDOSCOPY;  Service: Cardiovascular;  Laterality: N/A;  . colonoscopy with polypectomy  07/25/2007   Dr Olevia Perches, Recheck in 7 years for 2009  . G 4 P 2    . I&D EXTREMITY Right 01/08/2016   Procedure: IRRIGATION AND DEBRIDEMENT OF OLECRANON FRACTURE;  Surgeon: Renette Butters, MD;  Location: [facility];  Service: Orthopedics;  Laterality: Right;  . ORIF ELBOW FRACTURE Right 01/08/2016   Procedure: OPEN REDUCTION INTERNAL FIXATION (ORIF) ELBOW/OLECRANON FRACTURE, NEUROLYSIS;   Surgeon: Renette Butters, MD;  Location: [facility];  Service: Orthopedics;  Laterality: Right;    Social History   Socioeconomic History  . Marital status: Married    Spouse name: Not on file  . Number of children: 2  . Years of education: Not on file  . Highest education level: Not on file  Occupational History  . Occupation: Retired    Fish farm manager: RETIRED  Social Needs  . Financial resource strain: Not on file  . Food insecurity:    Worry: Not on file    Inability: Not on file  . Transportation needs:    Medical: Not on file    Non-medical: Not on  file  Tobacco Use  . Smoking status: Former Smoker    Packs/day: 0.50    Years: 14.00    Pack years: 7.00    Last attempt to quit: 1956    Years since quitting: 63.4  . Smokeless tobacco: Never Used  . Tobacco comment: smoked 1951-1965 , up to 1/2 ppd  Substance and Sexual Activity  . Alcohol use: No    Alcohol/week: 0.0 oz  . Drug use: No  . Sexual activity: Not on file  Lifestyle  . Physical activity:    Days per week: Not on file    Minutes per session: Not on file  . Stress: Not on file  Relationships  . Social connections:    Talks on phone: Not on file    Gets together: Not on file    Attends religious service: Not on file    Active member of club or organization: Not on file    Attends meetings of clubs or organizations: Not on file    Relationship status: Not on file  Other Topics Concern  . Not on file  Social History Narrative   Widowed.  Lives alone.  Ambulates without assistance.  Steady on feet.    Family History  Problem Relation Age of Onset  . Diabetes Mother   . Heart attack Maternal Uncle 65       his 2 sons had MI in 63s  . Hypertension Maternal Grandmother   . Stroke Maternal Grandmother 69  . Cancer Neg Hx   . Asthma Neg Hx   . COPD Neg Hx     Review of Systems  Constitutional: Positive for fatigue. Negative for appetite change, chills and fever.  Respiratory: Positive for shortness of  breath (sometiemes with exertion in house, always with longer distances). Negative for cough and wheezing.   Cardiovascular: Negative for chest pain, palpitations and leg swelling.  Gastrointestinal: Negative for abdominal pain and nausea.  Neurological: Negative for light-headedness and headaches.  Psychiatric/Behavioral: Negative for sleep disturbance.       Objective:   Vitals:   11/15/17 1107  BP: (!) 168/82  Pulse: 63  Resp: 16  Temp: 97.8 F (36.6 C)  SpO2: 98%   BP Readings from Last 3 Encounters:  11/15/17 (!) 168/82  09/23/17 (!) 153/66  09/09/17 (!) 152/68   Wt Readings from Last 3 Encounters:  11/15/17 148 lb (67.1 kg)  09/23/17 151 lb (68.5 kg)  09/09/17 151 lb (68.5 kg)   Body mass index is 30.93 kg/m.   Physical Exam    Constitutional: Appears well-developed and well-nourished. No distress.  HENT:  Head: Normocephalic and atraumatic.  Neck: Neck supple. No tracheal deviation present. No thyromegaly present.  No cervical lymphadenopathy Cardiovascular: Normal rate, regular rhythm and normal heart sounds.   1/6 systolic murmur heard. No carotid bruit .  No edema Pulmonary/Chest: Effort normal and breath sounds normal. No respiratory distress. No has no wheezes. No rales.  Skin: Skin is warm and dry. Not diaphoretic.  Psychiatric: Normal mood and affect. Behavior is normal.   PRE-PROCEDURE EXAM: Bilateral TMs cannot be visualized due to total occlusion/impaction of the ear canal. PROCEDURE INDICATION: remove wax to visualize ear drum & improve hearing CONSENT:  Verbal  PROCEDURE NOTE:   RIGHT EAR:  The CMA used a metal wax curette under direct vision with an otoscope to free the wax bolus from both ear canals.  No lavage with water was necessary.   POST- PROCEDURE EXAM: TMs successfully visualized  and found to have minimal remaining cerumen.  Ear canals with minimal erythema and bleeding.  Hearing significantly improved.  Tolerated procedure  well.    Assessment & Plan:    See Problem List for Assessment and Plan of chronic medical problems.

## 2017-11-15 ENCOUNTER — Other Ambulatory Visit (INDEPENDENT_AMBULATORY_CARE_PROVIDER_SITE_OTHER): Payer: Medicare Other

## 2017-11-15 ENCOUNTER — Encounter: Payer: Self-pay | Admitting: Internal Medicine

## 2017-11-15 ENCOUNTER — Ambulatory Visit (INDEPENDENT_AMBULATORY_CARE_PROVIDER_SITE_OTHER): Payer: Medicare Other | Admitting: Internal Medicine

## 2017-11-15 VITALS — BP 168/82 | HR 63 | Temp 97.8°F | Resp 16 | Wt 148.0 lb

## 2017-11-15 DIAGNOSIS — D509 Iron deficiency anemia, unspecified: Secondary | ICD-10-CM | POA: Diagnosis not present

## 2017-11-15 DIAGNOSIS — R739 Hyperglycemia, unspecified: Secondary | ICD-10-CM

## 2017-11-15 DIAGNOSIS — R0602 Shortness of breath: Secondary | ICD-10-CM

## 2017-11-15 DIAGNOSIS — F419 Anxiety disorder, unspecified: Secondary | ICD-10-CM | POA: Diagnosis not present

## 2017-11-15 DIAGNOSIS — R609 Edema, unspecified: Secondary | ICD-10-CM | POA: Diagnosis not present

## 2017-11-15 DIAGNOSIS — K219 Gastro-esophageal reflux disease without esophagitis: Secondary | ICD-10-CM

## 2017-11-15 DIAGNOSIS — N183 Chronic kidney disease, stage 3 unspecified: Secondary | ICD-10-CM

## 2017-11-15 DIAGNOSIS — E039 Hypothyroidism, unspecified: Secondary | ICD-10-CM

## 2017-11-15 DIAGNOSIS — I481 Persistent atrial fibrillation: Secondary | ICD-10-CM

## 2017-11-15 DIAGNOSIS — H9193 Unspecified hearing loss, bilateral: Secondary | ICD-10-CM

## 2017-11-15 DIAGNOSIS — I10 Essential (primary) hypertension: Secondary | ICD-10-CM

## 2017-11-15 DIAGNOSIS — R5383 Other fatigue: Secondary | ICD-10-CM

## 2017-11-15 DIAGNOSIS — N8110 Cystocele, unspecified: Secondary | ICD-10-CM

## 2017-11-15 DIAGNOSIS — H9192 Unspecified hearing loss, left ear: Secondary | ICD-10-CM | POA: Insufficient documentation

## 2017-11-15 DIAGNOSIS — I4819 Other persistent atrial fibrillation: Secondary | ICD-10-CM

## 2017-11-15 LAB — COMPREHENSIVE METABOLIC PANEL
ALBUMIN: 4.2 g/dL (ref 3.5–5.2)
ALK PHOS: 64 U/L (ref 39–117)
ALT: 8 U/L (ref 0–35)
AST: 11 U/L (ref 0–37)
BILIRUBIN TOTAL: 0.4 mg/dL (ref 0.2–1.2)
BUN: 13 mg/dL (ref 6–23)
CO2: 27 meq/L (ref 19–32)
CREATININE: 0.92 mg/dL (ref 0.40–1.20)
Calcium: 9.4 mg/dL (ref 8.4–10.5)
Chloride: 98 mEq/L (ref 96–112)
GFR: 61.26 mL/min (ref >60.00)
Glucose, Bld: 103 mg/dL — ABNORMAL HIGH (ref 70–99)
Potassium: 4.2 mEq/L (ref 3.5–5.1)
Sodium: 134 mEq/L — ABNORMAL LOW (ref 135–145)
TOTAL PROTEIN: 7.1 g/dL (ref 6.0–8.3)

## 2017-11-15 LAB — CBC WITH DIFFERENTIAL/PLATELET
BASOS ABS: 0 10*3/uL (ref 0.0–0.1)
Basophils Relative: 0.6 % (ref 0.0–3.0)
EOS ABS: 0.1 10*3/uL (ref 0.0–0.7)
Eosinophils Relative: 0.9 % (ref 0.0–5.0)
HEMATOCRIT: 32.7 % — AB (ref 36.0–46.0)
Hemoglobin: 11.1 g/dL — ABNORMAL LOW (ref 12.0–15.0)
Lymphocytes Relative: 20.4 % (ref 12.0–46.0)
Lymphs Abs: 1.3 10*3/uL (ref 0.7–4.0)
MCHC: 34.1 g/dL (ref 30.0–36.0)
MCV: 84.1 fl (ref 78.0–100.0)
Monocytes Absolute: 0.4 10*3/uL (ref 0.1–1.0)
Monocytes Relative: 6.8 % (ref 3.0–12.0)
NEUTROS ABS: 4.7 10*3/uL (ref 1.4–7.7)
NEUTROS PCT: 71.3 % (ref 43.0–77.0)
PLATELETS: 294 10*3/uL (ref 150.0–400.0)
RBC: 3.9 Mil/uL (ref 3.87–5.11)
RDW: 18.4 % — ABNORMAL HIGH (ref 11.5–15.5)
WBC: 6.6 10*3/uL (ref 4.0–10.5)

## 2017-11-15 LAB — LIPID PANEL
CHOL/HDL RATIO: 4
CHOLESTEROL: 231 mg/dL — AB (ref 0–200)
HDL: 60.4 mg/dL (ref >39.00)
LDL CALC: 140 mg/dL — AB (ref 0–99)
NONHDL: 170.78
Triglycerides: 154 mg/dL — ABNORMAL HIGH (ref 0.0–149.0)
VLDL: 30.8 mg/dL (ref 0.0–40.0)

## 2017-11-15 LAB — IRON,TIBC AND FERRITIN PANEL
%SAT: 13 % (calc) (ref 11–50)
Ferritin: 34 ng/mL (ref 20–288)
IRON: 42 ug/dL — AB (ref 45–160)
TIBC: 332 ug/dL (ref 250–450)

## 2017-11-15 LAB — HEMOGLOBIN A1C: Hgb A1c MFr Bld: 5.5 % (ref 4.6–6.5)

## 2017-11-15 LAB — TSH: TSH: 1.14 u[IU]/mL (ref 0.35–4.50)

## 2017-11-15 NOTE — Assessment & Plan Note (Signed)
She is experiencing shortness of breath and fatigue with exertion This is not new and she thinks it started around the time of her atrial fibrillation Currently A. fib is well controlled Discussed that some of the medications may be contributing, but this is also close to the time that she became much less active and that may also be contributing She also has iron deficiency anemia and has been taking iron daily and hypothyroidism We will check blood work to rule out other causes She will discuss with cardiology Stressed the importance of trying to become more active to see if this improves her activity

## 2017-11-15 NOTE — Assessment & Plan Note (Signed)
Rate controlled, asymptomatic In sinus today Has cardiology follow-up next month Continue current medications CBC, CMP, TSH

## 2017-11-15 NOTE — Assessment & Plan Note (Signed)
CMP

## 2017-11-15 NOTE — Assessment & Plan Note (Signed)
Taking slow release iron daily We will check iron panel and CBC We will continue to monitor closely since on Eliquis

## 2017-11-15 NOTE — Assessment & Plan Note (Signed)
No edema Taking Lasix 10 mg only as needed-last took it 2 weeks ago CMP Okay to continue Lasix only as needed, especially because it does not make her feel well when she takes it

## 2017-11-15 NOTE — Assessment & Plan Note (Signed)
Anxiety is controlled with taking Ativan as needed-she only takes half a pill and does not take it daily We will continue

## 2017-11-15 NOTE — Assessment & Plan Note (Signed)
A1c Encouraged increasing activity She also states she is not eating back healthy and will try to improve her eating habits-incorporating more vegetables

## 2017-11-15 NOTE — Assessment & Plan Note (Signed)
She has been experiencing fatigue or decreased energy level for a long time-may have started after she fell and broke her arm-since then she has been much less active A. fib currently controlled, but the medications she was started on at that time may also be contributing Check labs to rule out other causes-TSH, CBC C, CMP, iron panel given history of hypothyroidism and iron deficiency anemia

## 2017-11-15 NOTE — Assessment & Plan Note (Signed)
Check tsh  Titrate med dose if needed  

## 2017-11-15 NOTE — Assessment & Plan Note (Signed)
Has seen urology and he discussed with her the options were surgery or vaginal ring She is interested in getting a second opinion-we will refer to gynecology

## 2017-11-15 NOTE — Patient Instructions (Addendum)
[facility]  [place]  [place], [place] [address]  Main: [phone]   Test(s) ordered today. Your results will be released to [facility] (or called to you) after review, usually within 72hours after test completion. If any changes need to be made, you will be notified at that same time.   Medications reviewed and updated.   No changes recommended at this time.  Your prescription(s) have been submitted to your pharmacy. Please take as directed and contact our office if you believe you are having problem(s) with the medication(s).  A referral was ordered for GYN  Please followup in 6 months

## 2017-11-15 NOTE — Assessment & Plan Note (Signed)
Blood pressure slightly elevated here, but she states it is better controlled at home Continue control at home She does see cardiology next month, which recheck CMP

## 2017-11-15 NOTE — Assessment & Plan Note (Signed)
Acutely decreased hearing secondary to excessive cerumen bilaterally obstructing ear canals Both ears successfully cleaned out with curette Hearing improved

## 2017-11-15 NOTE — Assessment & Plan Note (Signed)
GERD controlled Continue daily medication  

## 2017-11-16 ENCOUNTER — Encounter: Payer: Self-pay | Admitting: Internal Medicine

## 2017-11-18 ENCOUNTER — Telehealth: Payer: Self-pay | Admitting: Internal Medicine

## 2017-11-18 NOTE — Telephone Encounter (Signed)
Copied from [place] [phone]. Topic: General - Call Back - No Documentation >> Nov 18, 2017  9:57 AM [name] R wrote: Reason for CRM:   Pt is calling in wanting her lab results in the mail. She states that her daughter received them on her cpu but she would like on paper

## 2017-11-18 NOTE — Telephone Encounter (Signed)
Mailed

## 2017-11-18 NOTE — Telephone Encounter (Signed)
Copied from [place] [phone]. Topic: General - Call Back - No Documentation >> Nov 18, 2017  9:57 AM Alfredia Ferguson R wrote: Reason for CRM:  Pt is calling in wanting her lab results in the mail. She states that her daughter received them on her cpu but she would like on paper

## 2017-12-09 ENCOUNTER — Other Ambulatory Visit: Payer: Self-pay | Admitting: Internal Medicine

## 2017-12-13 ENCOUNTER — Encounter: Payer: Self-pay | Admitting: Internal Medicine

## 2017-12-13 ENCOUNTER — Ambulatory Visit (INDEPENDENT_AMBULATORY_CARE_PROVIDER_SITE_OTHER): Payer: Medicare Other | Admitting: Internal Medicine

## 2017-12-13 VITALS — BP 148/72 | HR 59 | Ht <= 58 in | Wt 149.0 lb

## 2017-12-13 DIAGNOSIS — I422 Other hypertrophic cardiomyopathy: Secondary | ICD-10-CM | POA: Diagnosis not present

## 2017-12-13 DIAGNOSIS — I481 Persistent atrial fibrillation: Secondary | ICD-10-CM

## 2017-12-13 DIAGNOSIS — D509 Iron deficiency anemia, unspecified: Secondary | ICD-10-CM | POA: Diagnosis not present

## 2017-12-13 DIAGNOSIS — I4819 Other persistent atrial fibrillation: Secondary | ICD-10-CM

## 2017-12-13 DIAGNOSIS — I10 Essential (primary) hypertension: Secondary | ICD-10-CM | POA: Diagnosis not present

## 2017-12-13 MED ORDER — CARVEDILOL 3.125 MG PO TABS: 3.1250 mg | ORAL_TABLET | Freq: Two times a day (BID) | ORAL | 9 refills | Status: DC

## 2017-12-13 NOTE — Patient Instructions (Signed)
Medication Instructions:  Your physician has recommended you make the following change in your medication:  1. Discontinue Coreg (6.25 mg ) by mouth two times a day 2. Change to Coreg (3.125 mg) by mouth two times a day, sent in today to patient's requested pharmacy.    Labwork: -None  Testing/Procedures: -None  Follow-Up: Your physician recommends that you keep your scheduled  follow-up appointment with Richardson Dopp, PA.   Any Other Special Instructions Will Be Listed Below (If Applicable).     If you need a refill on your cardiac medications before your next appointment, please call your pharmacy.

## 2017-12-13 NOTE — Progress Notes (Signed)
Follow-up Outpatient Visit Date: 12/13/2017  Primary Care Provider: Binnie Rail, MD [place] [address]  Chief Complaint: Fatigue  HPI:  [name] is a 84 y.o. year-old female with history of persistent atrial fibrillation status post cardioversion, apical hypertrophic cardiomyopathy, hypertension, hyperlipidemia, hypothyroidism, and chronic kidney disease, who presents for follow-up of atrial fibrillation and HCM.  I last saw [name] in April, at which time he reported increased exertional dyspnea.  She also noted that her home blood pressures were running a little high.  She had also recently been found to have iron deficiency anemia, which was felt to be a contributing factor to her shortness of breath.  We increased lisinopril to 30 mg daily given suboptimal blood pressure control.  Blood pressure was still elevated at follow-up nurse visit, prompting [place] to increase lisinopril to 40 mg daily.  Today, [name] reports feeling about the same.  She still notes exertional dyspnea and fatigue with modest activity that is unchanged from prior visits.  She is not sure that she feels any better compared to when she was in a-fib.  She wonders if some of her medications could be contributing to her fatigue.  She denies chest pain, palpitations, lightheadedness, and edema.  She bruises easily but has not had any significant bleeding.  She is currently using furosemide 2-3 days a week if she notices mild swelling in her ankles.  --------------------------------------------------------------------------------------------------  Cardiovascular History & Procedures: Cardiovascular Problems:  Persistent atrial fibtillation  Apical HCM  Risk Factors:  HTN, hyperlipidemia, and age > 2  Cath/PCI:  None  CV Surgery:  None  EP Procedures and Devices:  DCCV (01/03/17)  Non-Invasive Evaluation(s):  TTE (09/04/16): Normal LV size with marked apical hypertrophy. LVEF  60-65%. No WMA. Mild MR. Moderate left atrial enlargement. Normal RV size and function. Mild right atrial enlargement.   Recent CV Pertinent Labs: Lab Results  Component Value Date   CHOL 231 (H) 11/15/2017   HDL 60.40 11/15/2017   LDLCALC 140 (H) 11/15/2017   LDLDIRECT 128.9 09/09/2012   TRIG 154.0 (H) 11/15/2017   CHOLHDL 4 11/15/2017   INR 1.1 12/31/2016   K 4.2 11/15/2017   BUN 13 11/15/2017   BUN 13 10/07/2017   CREATININE 0.92 11/15/2017    Past medical and surgical history were reviewed and updated in EPIC.  Current Meds  Medication Sig  . beta carotene w/minerals (OCUVITE) tablet Take 1 tablet by mouth daily.  . carvedilol (COREG) 6.25 MG tablet Take 1 tablet (6.25 mg total) by mouth 2 (two) times daily.  Marland Kitchen ELIQUIS 5 MG TABS tablet TAKE 1 TABLET (5 MG TOTAL) BY MOUTH 2 TIMES DAILY  . ferrous sulfate 325 (65 FE) MG EC tablet Take 325 mg by mouth daily.  . furosemide (LASIX) 20 MG tablet Take 1 tablet 20 mg by mouth every other day  . levothyroxine (SYNTHROID, LEVOTHROID) 75 MCG tablet TAKE 1 TAB BY MOUTH ONCE DAILY WITH EXCEPTION OF TUESDAYS AND SATURDAYS TAKE HALF A TAB AS DIRECTED  . lisinopril (PRINIVIL,ZESTRIL) 40 MG tablet Take 1 tablet (40 mg total) by mouth daily.  Marland Kitchen LORazepam (ATIVAN) 0.5 MG tablet TAKE 1/2 TABLET BY MOUTH DAILY AS NEEDED FOR ANXIETY  . omeprazole (PRILOSEC) 20 MG capsule TAKE ONE CAPSULE BY MOUTH DAILY    Allergies: Achromycin [tetracycline hcl]; Penicillins; Simvastatin; Celecoxib; Clarithromycin; Ezetimibe-simvastatin; Flagyl [metronidazole]; and Abilify [aripiprazole]  Social History   Tobacco Use  . Smoking status: Former Smoker  Packs/day: 0.50    Years: 14.00    Pack years: 7.00    Last attempt to quit: 1956    Years since quitting: 63.5  . Smokeless tobacco: Never Used  . Tobacco comment: smoked 1951-1965 , up to 1/2 ppd  Substance Use Topics  . Alcohol use: No    Alcohol/week: 0.0 oz  . Drug use: No    Family History    Problem Relation Age of Onset  . Diabetes Mother   . Heart attack Maternal Uncle 41       his 2 sons had MI in 12s  . Hypertension Maternal Grandmother   . Stroke Maternal Grandmother 69  . Cancer Neg Hx   . Asthma Neg Hx   . COPD Neg Hx     Review of Systems: A 12-system review of systems was performed and was negative except as noted in the HPI.  --------------------------------------------------------------------------------------------------  Physical Exam: BP (!) 148/72   Pulse (!) 59   Ht 4\' 9"  (1.448 m)   Wt 149 lb (67.6 kg)   LMP  (LMP Unknown)   SpO2 97%   BMI 32.24 kg/m   General:  NAD.  Accompanied by her daughter. HEENT: No conjunctival pallor or scleral icterus. Moist mucous membranes.  OP clear. Neck: Supple without lymphadenopathy, thyromegaly, JVD, or HJR. No carotid bruit. Lungs: Normal work of breathing. Clear to auscultation bilaterally without wheezes or crackles. Heart: Regular rate and rhythm without murmurs, rubs, or gallops. Abd: Bowel sounds present. Soft, NT/ND without hepatosplenomegaly Ext: No lower extremity edema. Radial, PT, and DP pulses are 2+ bilaterally. Skin: Warm and dry without rash.  EKG:  Sinus bradycardia (59 bpm) with anterolateral T-wave inversions.  No significant change since 09/09/17.  Lab Results  Component Value Date   WBC 6.6 11/15/2017   HGB 11.1 (L) 11/15/2017   HCT 32.7 (L) 11/15/2017   MCV 84.1 11/15/2017   PLT 294.0 11/15/2017    Lab Results  Component Value Date   NA 134 (L) 11/15/2017   K 4.2 11/15/2017   CL 98 11/15/2017   CO2 27 11/15/2017   BUN 13 11/15/2017   CREATININE 0.92 11/15/2017   GLUCOSE 103 (H) 11/15/2017   ALT 8 11/15/2017    Lab Results  Component Value Date   CHOL 231 (H) 11/15/2017   HDL 60.40 11/15/2017   LDLCALC 140 (H) 11/15/2017   LDLDIRECT 128.9 09/09/2012   TRIG 154.0 (H) 11/15/2017   CHOLHDL 4 11/15/2017     --------------------------------------------------------------------------------------------------  ASSESSMENT AND PLAN: Persistent atrial fibrillation No evidence of recurrence following cardioversion last year.  We will continue indefinite anticoagulation with apixaban.  Given continued fatigue and DOE, we will decrease carvedilol to 3.125 mg BID.  If symptoms persist, we may need to discontinue this altogether at follow-up.  HCM Amber. [name] appears euvolemic with NYHA class II HF symptoms.  I will decrease carvedilol today, though she may actually become more symptomatic as her heart rate increases.  If her heart rate rises significantly, we could try transitioning her from carvedilol to diltiazem to see if stopping beta-blockade improves her fatigue/DOE.  We will start with carvedilol dose-reduction first and reassess her symptoms in ~1 month.  Anemia Likely contributing to her shortness of breath, though hemoglobin was higher on recheck last month.  Continue management per her PCP.  Hypertension Blood pressure still mildly elevated.  Given [name]' age, we will tolerate a degree of permissive hypertension.  As above, we will decrease carvedilol today  but will need to think of adding another agent when she returns if BP has risen further.  I would favor diltiazem or amlodipine, based on her heart rate.  Follow-up: Return to clinic in 1 month.  Nelva Bush, MD 12/13/2017 10:09 AM

## 2017-12-14 ENCOUNTER — Encounter: Payer: Self-pay | Admitting: Internal Medicine

## 2017-12-27 ENCOUNTER — Ambulatory Visit (INDEPENDENT_AMBULATORY_CARE_PROVIDER_SITE_OTHER): Payer: Medicare Other | Admitting: Obstetrics & Gynecology

## 2017-12-27 ENCOUNTER — Ambulatory Visit: Payer: Self-pay | Admitting: Obstetrics & Gynecology

## 2017-12-27 ENCOUNTER — Encounter: Payer: Self-pay | Admitting: Obstetrics & Gynecology

## 2017-12-27 VITALS — BP 130/70 | Ht <= 58 in | Wt 150.0 lb

## 2017-12-27 DIAGNOSIS — N813 Complete uterovaginal prolapse: Secondary | ICD-10-CM | POA: Diagnosis not present

## 2017-12-27 NOTE — Progress Notes (Signed)
    [name] 1927/07/03 [id number]        84 y.o.  [id number]   RP: Bulging of vagina visible at vulva with difficulty passing urine x 2 years  HPI: Menopause, no HRT, no PMB.  C/O bulging of vagina visible at vulva x about 2 years.  Having a hard time passing urine during the day, sometimes pushes the bulge back in to help.  Passes urine better at night.  Occasional urgency with leakage.  No burning with miction, no blood in urine.  No pelvic pain.  Abstinent.  BMs wnl.   OB History  Gravida Para Term Preterm AB Living  4 2     2 2   SAB TAB Ectopic Multiple Live Births  2            # Outcome Date GA Lbr Len/2nd Weight Sex Delivery Anes PTL Lv  4 SAB           3 SAB           2 Para           1 Para             Past medical history,surgical history, problem list, medications, allergies, family history and social history were all reviewed and documented in the EPIC chart.   Directed ROS with pertinent positives and negatives documented in the history of present illness/assessment and plan.  Exam:  Vitals:   12/27/17 1008  BP: 130/70  Weight: 150 lb (68 kg)  Height: 4\' 10"  (1.473 m)   General appearance:  Normal  Abdomen: Normal  Gynecologic exam: Vulva normal.  Cervix protruding with anterior wall of vagina passed the vulva.  No ulceration, no erythema of cervix or vagina.  Bimanual exam:  Uterus normal volume, no adnexal mass felt, NT bilaterally.  No rectocele.  Pessary fitting done.  Milex ring #5 with support inserted easily and appears to be the right size.  Patient tried this pessary in the office with ambulation and squatting with no incontinence.  She went to the bathroom and had successful emptying of the bladder.  T  he pessary stayed in place.   Pessary removed.   Assessment/Plan:  84 y.o. [id number]   1. Complete uterine prolapse with prolapse of anterior vaginal wall Findings of complete uterine prolapse with cystocele discussed with patient.  Counseling  done on management.  Pessary recommended over surgery and patient agrees that she would prefer avoiding of surgery at this point in her life.  Good fit of Milex ring #5 with support.  Will order Milex ring #5 with support, patient will come back to put it in place.  Counseling on above issues and coordination of care more than 50% for 30 minutes.  Princess Bruins MD, 10:53 AM 12/27/2017

## 2017-12-28 ENCOUNTER — Encounter: Payer: Self-pay | Admitting: Obstetrics & Gynecology

## 2017-12-28 NOTE — Patient Instructions (Signed)
  1. Complete uterine prolapse with prolapse of anterior vaginal wall Findings of complete uterine prolapse with cystocele discussed with patient.  Counseling done on management.  Pessary recommended over surgery and patient agrees that she would prefer avoiding of surgery at this point in her life.  Good fit of Milex ring #5 with support.  Will order Milex ring #5 with support, patient will come back to put it in place.  [name], it was a pleasure meeting you today!

## 2018-01-03 ENCOUNTER — Other Ambulatory Visit: Payer: Self-pay

## 2018-01-07 ENCOUNTER — Telehealth: Payer: Self-pay | Admitting: *Deleted

## 2018-01-07 NOTE — Telephone Encounter (Signed)
Patient called and left message in triage voicemail asking for return call. I called and received her voicemail asking her to call me.

## 2018-01-08 NOTE — Telephone Encounter (Signed)
Patient called me back asking about her pessary has not heard from the office regarding this. I told her I will check with Earnest Bailey regarding this, I sent a staff message

## 2018-01-09 ENCOUNTER — Other Ambulatory Visit: Payer: Self-pay | Admitting: Internal Medicine

## 2018-01-09 NOTE — Telephone Encounter (Signed)
 Controlled Substance Database checked. Last filled on 11/03/17

## 2018-01-16 ENCOUNTER — Ambulatory Visit (INDEPENDENT_AMBULATORY_CARE_PROVIDER_SITE_OTHER): Payer: Medicare Other | Admitting: Obstetrics & Gynecology

## 2018-01-16 ENCOUNTER — Encounter: Payer: Self-pay | Admitting: Obstetrics & Gynecology

## 2018-01-16 VITALS — BP 132/84

## 2018-01-16 DIAGNOSIS — N813 Complete uterovaginal prolapse: Secondary | ICD-10-CM | POA: Diagnosis not present

## 2018-01-16 DIAGNOSIS — Z4689 Encounter for fitting and adjustment of other specified devices: Secondary | ICD-10-CM

## 2018-01-16 NOTE — Patient Instructions (Signed)
1. Encounter for fitting and adjustment of pessary Milex pessary ring #5 with support ordered and received.  Inserted without difficulty today.  Patient will follow-up in 4 weeks to reevaluate.  2. Complete uterine prolapse As above.  [name], good seeing you today!

## 2018-01-16 NOTE — Progress Notes (Signed)
    [name] 28-Aug-1927 [id number]        84 y.o.  [id number]   RP: Insertion of Milex ring #5 with support for complete uterine prolapse  HPI: No change x last visit 12/27/2017.  Continues to feel and see the cervix outside the vulva.  Patient had not been able to pass urine the whole morning.  Last micturition was in the middle of the night.  No burning with micturition.  No fever.   OB History  Gravida Para Term Preterm AB Living  4 2     2 2   SAB TAB Ectopic Multiple Live Births  2            # Outcome Date GA Lbr Len/2nd Weight Sex Delivery Anes PTL Lv  4 SAB           3 SAB           2 Para           1 Para             Past medical history,surgical history, problem list, medications, allergies, family history and social history were all reviewed and documented in the EPIC chart.   Directed ROS with pertinent positives and negatives documented in the history of present illness/assessment and plan.  Exam:  Vitals:   01/16/18 1057  BP: 132/84   General appearance:  Normal  Gynecologic exam: Vulva normal, but cervix visible protruding at vulva.  Easy insertion of pessary.  Severe leakage of urine as patient got from laying down to sitting position (Bladder was overfilled as she was not able to pass urine this morning).   Assessment/Plan:  84 y.o. [id number]   1. Encounter for fitting and adjustment of pessary Milex pessary ring #5 with support ordered and received.  Inserted without difficulty today.  Patient will follow-up in 4 weeks to reevaluate.  2. Complete uterine prolapse As above.   Princess Bruins MD, 11:44 AM 01/16/2018

## 2018-01-20 DIAGNOSIS — H26492 Other secondary cataract, left eye: Secondary | ICD-10-CM | POA: Diagnosis not present

## 2018-01-20 DIAGNOSIS — H353132 Nonexudative age-related macular degeneration, bilateral, intermediate dry stage: Secondary | ICD-10-CM | POA: Diagnosis not present

## 2018-01-20 DIAGNOSIS — Z961 Presence of intraocular lens: Secondary | ICD-10-CM | POA: Diagnosis not present

## 2018-01-20 DIAGNOSIS — H35033 Hypertensive retinopathy, bilateral: Secondary | ICD-10-CM | POA: Diagnosis not present

## 2018-02-06 ENCOUNTER — Other Ambulatory Visit: Payer: Self-pay | Admitting: Internal Medicine

## 2018-02-07 ENCOUNTER — Ambulatory Visit (INDEPENDENT_AMBULATORY_CARE_PROVIDER_SITE_OTHER): Payer: Medicare Other | Admitting: Physician Assistant

## 2018-02-07 ENCOUNTER — Encounter: Payer: Self-pay | Admitting: Physician Assistant

## 2018-02-07 VITALS — BP 144/68 | HR 64 | Ht <= 58 in | Wt 149.0 lb

## 2018-02-07 DIAGNOSIS — I422 Other hypertrophic cardiomyopathy: Secondary | ICD-10-CM | POA: Diagnosis not present

## 2018-02-07 DIAGNOSIS — I481 Persistent atrial fibrillation: Secondary | ICD-10-CM | POA: Diagnosis not present

## 2018-02-07 DIAGNOSIS — D509 Iron deficiency anemia, unspecified: Secondary | ICD-10-CM

## 2018-02-07 DIAGNOSIS — I10 Essential (primary) hypertension: Secondary | ICD-10-CM

## 2018-02-07 DIAGNOSIS — I4819 Other persistent atrial fibrillation: Secondary | ICD-10-CM

## 2018-02-07 MED ORDER — DILTIAZEM HCL ER COATED BEADS 120 MG PO CP24
120.0000 mg | ORAL_CAPSULE | Freq: Every day | ORAL | 3 refills | Status: DC
Start: 2018-02-11 — End: 2018-02-14

## 2018-02-07 NOTE — Progress Notes (Signed)
Cardiology Office Note:    Date:  02/07/2018   ID:  [name], DOB 02-Jul-1927, MRN [id number]  PCP:  Binnie Rail, MD  Cardiologist:  Nelva Bush, MD >> requests switching to Casandra Doffing, MD  Referring MD: Binnie Rail, MD   Chief Complaint  Patient presents with  . Follow-up    AFib, HCM    History of Present Illness:    [name] is a 84 y.o. female with persistent atrial fibrillation, apical variant hypertrophic cardiomyopathy, hypertension, hyperlipidemia, chronic kidney disease, anemia.  She underwent cardioversion in 2018.  She was last seen by Dr. Saunders Revel in 12/2017.  She reported fatigue and dyspnea on exertion.  She remained in normal sinus rhythm.  Her beta-blocker dose was reduced to see if she was having side effects to this drug class.  Consideration would have to be given to switching her to Diltiazem if her symptoms improved off of beta-blocker.    CHADS2-VASc=4   [name] returns for follow-up.  She is here with her daughter.  Since last seen, she has done well.  She does feel like she has more energy.  She does feel like she is less short of breath.  Overall, she does still feel tired.  She denies chest discomfort, orthopnea, PND or lower extremity swelling.  She denies syncope or near syncope.  She denies any bleeding issues.   Prior CV studies:   The following studies were reviewed today:  Echo (09/04/16):  Normal LV size with marked apical hypertrophy. LVEF 60-65%. No WMA. Mild MR. Moderate left atrial enlargement. Normal RV size and function. Mild right atrial enlargement.  Past Medical History:  Diagnosis Date  . Adrenal myelolipoma   . Apical variant hypertrophic cardiomyopathy ([place])    Echo 4/18: Normal LV size with marked apical hypertrophy. LVEF 60-65%. No WMA. Mild MR. Moderate left atrial enlargement. Normal RV size and function. Mild right atrial enlargemen  . Chronic kidney disease   . DIVERTICULOSIS, COLON   . Dysmetabolic syndrome  X   . FIBROMYALGIA   . Gastric ulcer due to Helicobacter pylori   . Hiatal hernia   . Hx of cataract surgery 01/08/2016  . HYPERLIPIDEMIA   . Hyperplastic colon polyp   . HYPERTENSION, ESSENTIAL NOS   . Hyponatremia   . Hypothyroidism   . MYCOSIS FUNGOIDES   . Nodule of left lung   . Normocytic anemia   . OSTEOARTHRITIS   . OSTEOPOROSIS   . Persistent atrial fibrillation ([place])    s/p DCCV 2018 // Apixaban for anticoag  . Renal angiomyolipoma   . Unspecified vitamin D deficiency    Surgical Hx: The patient  has a past surgical history that includes G 4 P 2; colonoscopy with polypectomy (07/25/2007); ORIF elbow fracture (Right, 01/08/2016); I&D extremity (Right, 01/08/2016); and Cardioversion (N/A, 01/03/2017).   Current Medications: Current Meds  Medication Sig  . beta carotene w/minerals (OCUVITE) tablet Take 1 tablet by mouth daily.  Marland Kitchen ELIQUIS 5 MG TABS tablet TAKE 1 TABLET (5 MG TOTAL) BY MOUTH 2 TIMES DAILY  . ferrous sulfate 325 (65 FE) MG EC tablet Take 325 mg by mouth daily.  . furosemide (LASIX) 20 MG tablet Take 20 mg by mouth as needed.  Marland Kitchen levothyroxine (SYNTHROID, LEVOTHROID) 75 MCG tablet TAKE 1 TAB BY MOUTH ONCE DAILY WITH EXCEPTION OF TUESDAYS AND SATURDAYS TAKE HALF A TAB AS DIRECTED  . lisinopril (PRINIVIL,ZESTRIL) 40 MG tablet Take 1 tablet (40 mg total) by mouth  daily.  Marland Kitchen LORazepam (ATIVAN) 0.5 MG tablet TAKE 1/2 TABLET BY MOUTH DAILY AS NEEDED FOR ANXIETY  . omeprazole (PRILOSEC) 20 MG capsule TAKE ONE CAPSULE BY MOUTH DAILY  . [DISCONTINUED] carvedilol (COREG) 3.125 MG tablet Take 1 tablet (3.125 mg total) by mouth 2 (two) times daily.     Allergies:   Achromycin [tetracycline hcl]; Penicillins; Simvastatin; Celecoxib; Clarithromycin; Ezetimibe-simvastatin; Flagyl [metronidazole]; and Abilify [aripiprazole]   Social History   Tobacco Use  . Smoking status: Former Smoker    Packs/day: 0.50    Years: 14.00    Pack years: 7.00    Last attempt to quit: 1956     Years since quitting: 63.7  . Smokeless tobacco: Never Used  . Tobacco comment: smoked 1951-1965 , up to 1/2 ppd  Substance Use Topics  . Alcohol use: No    Alcohol/week: 0.0 standard drinks  . Drug use: No     Family Hx: The patient's family history includes Diabetes in her mother; Heart attack (age of onset: 68) in her maternal uncle; Hypertension in her maternal grandmother; Stroke (age of onset: 32) in her maternal grandmother. There is no history of Cancer, Asthma, or COPD.  ROS:   Please see the history of present illness.    Review of Systems  HENT: Positive for nosebleeds.    All other systems reviewed and are negative.   EKGs/Labs/Other Test Reviewed:    EKG:  EKG is  ordered today.  The ekg ordered today demonstrates normal sinus rhythm, heart rate 64, normal axis, nonspecific ST-T wave changes, QTC 381  Recent Labs: 11/15/2017: ALT 8; BUN 13; Creatinine, Ser 0.92; Hemoglobin 11.1; Platelets 294.0; Potassium 4.2; Sodium 134; TSH 1.14   Recent Lipid Panel Lab Results  Component Value Date/Time   CHOL 231 (H) 11/15/2017 12:01 PM   TRIG 154.0 (H) 11/15/2017 12:01 PM   HDL 60.40 11/15/2017 12:01 PM   CHOLHDL 4 11/15/2017 12:01 PM   LDLCALC 140 (H) 11/15/2017 12:01 PM   LDLDIRECT 128.9 09/09/2012 11:29 AM    Physical Exam:    VS:  BP (!) 144/68   Pulse 64   Ht 4\' 10"  (1.473 m)   Wt 149 lb (67.6 kg)   LMP  (LMP Unknown)   SpO2 98%   BMI 31.14 kg/m     Wt Readings from Last 3 Encounters:  02/07/18 149 lb (67.6 kg)  12/27/17 150 lb (68 kg)  12/13/17 149 lb (67.6 kg)     Physical Exam  Constitutional: She is oriented to person, place, and time. She appears well-developed and well-nourished. No distress.  HENT:  Head: Normocephalic and atraumatic.  Eyes: No scleral icterus.  Neck: No JVD present. No thyromegaly present.  Cardiovascular: Normal rate and regular rhythm.  No murmur heard. Pulmonary/Chest: Effort normal. She has no rales.  Abdominal: Soft.  She exhibits no distension.  Musculoskeletal: She exhibits no edema.  Lymphadenopathy:    She has no cervical adenopathy.  Neurological: She is alert and oriented to person, place, and time.  Skin: Skin is warm and dry.  Psychiatric: She has a normal mood and affect.    ASSESSMENT & PLAN:    Persistent atrial fibrillation (HCC) She remains in normal sinus rhythm.  She does feel better on low-dose beta-blocker therapy.  Made a long discussion regarding whether not to continue her current dose of beta-blocker versus switching her to a calcium channel blocker.  As she still does feel somewhat fatigued, she would like to see how she  feels off of beta-blocker therapy.  Therefore, I will stop her carvedilol.  I will place her on Cardizem CD 120 mg daily.  I will see her back in 3 to 4 weeks to reassess her symptoms.  Continue current dose of Apixaban.  Apical variant hypertrophic cardiomyopathy ([place]) As noted, her beta-blocker will be changed to calcium channel blocker.  Iron deficiency anemia, unspecified iron deficiency anemia type Recent hemoglobin stable.  Essential hypertension Fair control.  Adjust medications as above.   Dispo:  Return in about 4 weeks (around 03/07/2018) for Close Follow Up, w/ Richardson Dopp, PA-C.   Medication Adjustments/Labs and Tests Ordered: Current medicines are reviewed at length with the patient today.  Concerns regarding medicines are outlined above.  Tests Ordered: Orders Placed This Encounter  Procedures  . EKG 12-Lead   Medication Changes: Meds ordered this encounter  Medications  . diltiazem (CARDIZEM CD) 120 MG 24 hr capsule    Sig: Take 1 capsule (120 mg total) by mouth daily.    Dispense:  90 capsule    Refill:  3    CHANGE IN THERAPY:  PT WILL START ON 02/11/18; PT WILL TAKE LAST DOSE OF COREG Saturday 02/08/18    Signed, Richardson Dopp, PA-C  02/07/2018 1:14 PM    [facility] [address], [place], [place]   [address] Phone: [phone]; Fax: [phone]

## 2018-02-07 NOTE — Telephone Encounter (Signed)
[place] Controlled Substance Database checked. Last filled on  01/09/18  Last OV 11/15/17

## 2018-02-07 NOTE — Patient Instructions (Addendum)
Medication Instructions:  1. SKIP YOUR DOSE OF COREG TONIGHT 02/07/18; TAKE YOUR LAST DOSE OF COREG TOMORROW MORNING 02/08/18 Saturday, THEN STOP  2. START Tuesday 02/11/18 YOU WILL BEGIN CARDIZEM CD 120 MG ONCE A DAY; RX HAS BEEN SENT IN  Labwork: NONE ORDERED TODAY  Testing/Procedures: NONE ORDERED TODAY  Follow-Up: Richardson Dopp, [facility] 03/07/18 @ 11:15 AM   Any Other Special Instructions Will Be Listed Below (If Applicable).     If you need a refill on your cardiac medications before your next appointment, please call your pharmacy.

## 2018-02-14 ENCOUNTER — Telehealth: Payer: Self-pay | Admitting: Internal Medicine

## 2018-02-14 ENCOUNTER — Telehealth: Payer: Self-pay | Admitting: Physician Assistant

## 2018-02-14 MED ORDER — CARVEDILOL 3.125 MG PO TABS: 3.1250 mg | ORAL_TABLET | Freq: Two times a day (BID) | ORAL | Status: DC

## 2018-02-14 NOTE — Telephone Encounter (Signed)
° °  Pt c/o Shortness Of Breath: STAT if SOB developed within the last 24 hours or pt is noticeably SOB on the phone  1. Are you currently SOB (can you hear that pt is SOB on the phone)? YES  2. How long have you been experiencing SOB? 2 DAYS  3. Are you SOB when sitting or when up moving around? Sitting and moving around  4. Are you currently experiencing any other symptoms? NO

## 2018-02-14 NOTE — Telephone Encounter (Signed)
Spoke to daughter Opal Sidles) pt was taken off Carvedilol last week and started on Cardizem due to fatigue and SOB.  Starting yesterday she started feeling bad and today she has more fatigue and SOB is at rest.  Wt is stable at 148 lbs, no edema at feet/ankles or abdomen.  BP today was 132/53 HR 73 and 150/62 HR 70.  Daughter states they do not want to go into the weekend not knowing what to do.  Advised I will attempt to find out next moves and call back.

## 2018-02-14 NOTE — Telephone Encounter (Signed)
Spoke with Opal Sidles and reviewed orders.  She states understanding and notify the patient.

## 2018-02-14 NOTE — Telephone Encounter (Signed)
-   Stop Cardizem.  - Resume Coreg 3.125 mg Twice daily - she can start Sunday AM.  - If symptoms do not improve or worsen over the weekend, call the on call provider or go to the ED. Richardson Dopp, PA-C    02/14/2018 5:13 PM

## 2018-02-14 NOTE — Telephone Encounter (Signed)
New Message        Pt c/o medication issue:  1. Name of Medication: Cardizem   2. How are you currently taking this medication (dosage and times per day)? 120 mg once a day  3. Are you having a reaction (difficulty breathing--STAT)? No   4. What is your medication issue? Patient states she is just out of breath with this medication, she states she was told to stop taking the Carvedilol.Patient states she just don't feel well at all since taking this medication. Pls call and advise.

## 2018-02-21 ENCOUNTER — Ambulatory Visit (INDEPENDENT_AMBULATORY_CARE_PROVIDER_SITE_OTHER): Payer: Medicare Other | Admitting: Obstetrics & Gynecology

## 2018-02-21 ENCOUNTER — Encounter: Payer: Self-pay | Admitting: Obstetrics & Gynecology

## 2018-02-21 VITALS — BP 126/78

## 2018-02-21 DIAGNOSIS — Z23 Encounter for immunization: Secondary | ICD-10-CM

## 2018-02-21 DIAGNOSIS — N813 Complete uterovaginal prolapse: Secondary | ICD-10-CM

## 2018-02-21 DIAGNOSIS — Z4689 Encounter for fitting and adjustment of other specified devices: Secondary | ICD-10-CM | POA: Diagnosis not present

## 2018-02-21 NOTE — Patient Instructions (Signed)
  1. Complete uterine prolapse with prolapse of anterior vaginal wall Very satisfied with her pessary which completely resolved all the symptoms.  2. Encounter for pessary maintenance Milex ring #5 with support is a very good fit.  No vaginal irritation or ulceration.  Patient will follow-up in 4 months for pessary maintenance.  Mardene Celeste, good seeing you today!

## 2018-02-21 NOTE — Progress Notes (Signed)
    [name] 06-Oct-1927 [id number]        84 y.o.  [id number]   RP: Pessary for complete uterine prolapse with cystocele  HPI: Milex pessary #5 with support inserted on January 16, 2018.  Since then patient has been very satisfied with resolved bulging and protrusion at the vulva.  No urinary incontinence.  Sometimes feels urge to pass urine, but has time to make it to the bathroom.  No abnormal vaginal discharge.  No vaginal bleeding.  No pelvic pain.   OB History  Gravida Para Term Preterm AB Living  4 2     2 2   SAB TAB Ectopic Multiple Live Births  2            # Outcome Date GA Lbr Len/2nd Weight Sex Delivery Anes PTL Lv  4 SAB           3 SAB           2 Para           1 Para             Past medical history,surgical history, problem list, medications, allergies, family history and social history were all reviewed and documented in the EPIC chart.   Directed ROS with pertinent positives and negatives documented in the history of present illness/assessment and plan.  Exam:  There were no vitals filed for this visit. General appearance:  Normal  Gynecologic exam: Vulva normal.  Pessary easily removed.  Very mild normal vaginal secretions on its.  Thoroughly clean and desinfected with antibacterial soap.  Vaginal mucosa intact, no inflammation/ulceration.  Pessary put back in place easily.   Assessment/Plan:  84 y.o. [id number]   1. Complete uterine prolapse with prolapse of anterior vaginal wall Very satisfied with her pessary which completely resolved all the symptoms.  2. Encounter for pessary maintenance Milex ring #5 with support is a very good fit.  No vaginal irritation or ulceration.  Patient will follow-up in 4 months for pessary maintenance.  Counseling on above issues and coordination of care more than 50% for 15 minutes.  Princess Bruins MD, 11:26 AM 02/21/2018

## 2018-03-07 ENCOUNTER — Ambulatory Visit (INDEPENDENT_AMBULATORY_CARE_PROVIDER_SITE_OTHER): Payer: Medicare Other | Admitting: Physician Assistant

## 2018-03-07 ENCOUNTER — Encounter: Payer: Self-pay | Admitting: Physician Assistant

## 2018-03-07 VITALS — BP 164/64 | HR 58 | Ht <= 58 in | Wt 151.8 lb

## 2018-03-07 DIAGNOSIS — I422 Other hypertrophic cardiomyopathy: Secondary | ICD-10-CM

## 2018-03-07 DIAGNOSIS — I4819 Other persistent atrial fibrillation: Secondary | ICD-10-CM | POA: Diagnosis not present

## 2018-03-07 DIAGNOSIS — I10 Essential (primary) hypertension: Secondary | ICD-10-CM

## 2018-03-07 NOTE — Patient Instructions (Addendum)
Medication Instructions:   Your physician recommends that you continue on your current medications as directed. Please refer to the Current Medication list given to you today.   If you need a refill on your cardiac medications before your next appointment, please call your pharmacy.  Labwork: NONE ORDERED  TODAY    Testing/Procedures:  NONE ORDERED  TODAY    Follow-Up: Your physician wants you to follow-up in:  IN 6   MONTHS WITH DR Irish Lack   You will receive a reminder letter in the mail two months in advance. If you don't receive a letter, please call our office to schedule the follow-up appointment.   Any Other Special Instructions Will Be Listed Below (If Applicable).    Low-Sodium Eating Plan Sodium, which is an element that makes up salt, helps you maintain a healthy balance of fluids in your body. Too much sodium can increase your blood pressure and cause fluid and waste to be held in your body. Your health care provider or dietitian may recommend following this plan if you have high blood pressure (hypertension), kidney disease, liver disease, or heart failure. Eating less sodium can help lower your blood pressure, reduce swelling, and protect your heart, liver, and kidneys. What are tips for following this plan? General guidelines  Most people on this plan should limit their sodium intake to 1,500-2,000 mg (milligrams) of sodium each day. Reading food labels  The Nutrition Facts label lists the amount of sodium in one serving of the food. If you eat more than one serving, you must multiply the listed amount of sodium by the number of servings.  Choose foods with less than 140 mg of sodium per serving.  Avoid foods with 300 mg of sodium or more per serving. Shopping  Look for lower-sodium products, often labeled as "low-sodium" or "no salt added."  Always check the sodium content even if foods are labeled as "unsalted" or "no salt added".  Buy fresh foods. ? Avoid  canned foods and premade or frozen meals. ? Avoid canned, cured, or processed meats  Buy breads that have less than 80 mg of sodium per slice. Cooking  Eat more home-cooked food and less restaurant, buffet, and fast food.  Avoid adding salt when cooking. Use salt-free seasonings or herbs instead of table salt or sea salt. Check with your health care provider or pharmacist before using salt substitutes.  Cook with plant-based oils, such as canola, sunflower, or olive oil. Meal planning  When eating at a restaurant, ask that your food be prepared with less salt or no salt, if possible.  Avoid foods that contain MSG (monosodium glutamate). MSG is sometimes added to [place] food, bouillon, and some canned foods. What foods are recommended? The items listed may not be a complete list. Talk with your dietitian about what dietary choices are best for you. Grains Low-sodium cereals, including oats, puffed wheat and rice, and shredded wheat. Low-sodium crackers. Unsalted rice. Unsalted pasta. Low-sodium bread. Whole-grain breads and whole-grain pasta. Vegetables Fresh or frozen vegetables. "No salt added" canned vegetables. "No salt added" tomato sauce and paste. Low-sodium or reduced-sodium tomato and vegetable juice. Fruits Fresh, frozen, or canned fruit. Fruit juice. Meats and other protein foods Fresh or frozen (no salt added) meat, poultry, seafood, and fish. Low-sodium canned tuna and salmon. Unsalted nuts. Dried peas, beans, and lentils without added salt. Unsalted canned beans. Eggs. Unsalted nut butters. Dairy Milk. Soy milk. Cheese that is naturally low in sodium, such as ricotta cheese, fresh  mozzarella, or Swiss cheese Low-sodium or reduced-sodium cheese. Cream cheese. Yogurt. Fats and oils Unsalted butter. Unsalted margarine with no trans fat. Vegetable oils such as canola or olive oils. Seasonings and other foods Fresh and dried herbs and spices. Salt-free seasonings. Low-sodium  mustard and ketchup. Sodium-free salad dressing. Sodium-free light mayonnaise. Fresh or refrigerated horseradish. Lemon juice. Vinegar. Homemade, reduced-sodium, or low-sodium soups. Unsalted popcorn and pretzels. Low-salt or salt-free chips. What foods are not recommended? The items listed may not be a complete list. Talk with your dietitian about what dietary choices are best for you. Grains Instant hot cereals. Bread stuffing, pancake, and biscuit mixes. Croutons. Seasoned rice or pasta mixes. Noodle soup cups. Boxed or frozen macaroni and cheese. Regular salted crackers. Self-rising flour. Vegetables Sauerkraut, pickled vegetables, and relishes. Olives. [place] fries. Onion rings. Regular canned vegetables (not low-sodium or reduced-sodium). Regular canned tomato sauce and paste (not low-sodium or reduced-sodium). Regular tomato and vegetable juice (not low-sodium or reduced-sodium). Frozen vegetables in sauces. Meats and other protein foods Meat or fish that is salted, canned, smoked, spiced, or pickled. Bacon, ham, sausage, hotdogs, corned beef, chipped beef, packaged lunch meats, salt pork, jerky, pickled herring, anchovies, regular canned tuna, sardines, salted nuts. Dairy Processed cheese and cheese spreads. Cheese curds. Blue cheese. Feta cheese. String cheese. Regular cottage cheese. Buttermilk. Canned milk. Fats and oils Salted butter. Regular margarine. Ghee. Bacon fat. Seasonings and other foods Onion salt, garlic salt, seasoned salt, table salt, and sea salt. Canned and packaged gravies. Worcestershire sauce. Tartar sauce. Barbecue sauce. Teriyaki sauce. Soy sauce, including reduced-sodium. Steak sauce. Fish sauce. Oyster sauce. Cocktail sauce. Horseradish that you find on the shelf. Regular ketchup and mustard. Meat flavorings and tenderizers. Bouillon cubes. Hot sauce and Tabasco sauce. Premade or packaged marinades. Premade or packaged taco seasonings. Relishes. Regular salad  dressings. Salsa. Potato and tortilla chips. Corn chips and puffs. Salted popcorn and pretzels. Canned or dried soups. Pizza. Frozen entrees and pot pies. Summary  Eating less sodium can help lower your blood pressure, reduce swelling, and protect your heart, liver, and kidneys.  Most people on this plan should limit their sodium intake to 1,500-2,000 mg (milligrams) of sodium each day.  Canned, boxed, and frozen foods are high in sodium. Restaurant foods, fast foods, and pizza are also very high in sodium. You also get sodium by adding salt to food.  Try to cook at home, eat more fresh fruits and vegetables, and eat less fast food, canned, processed, or prepared foods. This information is not intended to replace advice given to you by your health care provider. Make sure you discuss any questions you have with your health care provider. Document Released: 11/10/2001 Document Revised: 05/14/2016 Document Reviewed: 05/14/2016 Elsevier Interactive Patient Education  [employer].

## 2018-03-07 NOTE — Progress Notes (Signed)
Cardiology Office Note:    Date:  03/07/2018   ID:  [name], DOB 18-Apr-1928, MRN [id number]  PCP:  Binnie Rail, MD  Cardiologist:  Nelva Bush, MD >> requests Dr. Irish Lack with Dr. Saunders Revel moving to [facility]   Electrophysiologist:  None   Referring MD: Binnie Rail, MD   Chief Complaint  Patient presents with  . Follow-up    AFib, HTN     History of Present Illness:    [name] is a 84 y.o. female with persistent atrial fibrillation, apical variant hypertrophic cardiomyopathy, hypertension, hyperlipidemia, chronic kidney disease, anemia.  She underwent cardioversion in 2018.  Her beta-blocker dose has been reduced due to symptoms of fatigue and dyspnea on exertion.  She was feeling better at her last visit with me in 02/2018.  We decided to try to switch her from a beta-blocker to a calcium channel blocker.  However, she could not tolerate this and was placed back on her beta-blocker.     CHADS2-VASc=4   [name] returns for follow up. She is here with her daughter.  She feels better on carvedilol.  She had increasing shortness of breath with diltiazem.  She does admit to a diet high in salt at times.  Her BP is higher today.  She had a salty meal last night. She has some increased swelling in her feet today.  She denies chest pain, syncope, paroxysmal nocturnal dyspnea.    Prior CV studies:   The following studies were reviewed today:   Echo(09/04/16):  Normal LV size with marked apical hypertrophy. LVEF 60-65%. No WMA. Mild MR. Moderate left atrial enlargement. Normal RV size and function. Mild right atrial enlargement.   Past Medical History:  Diagnosis Date  . Adrenal myelolipoma   . Apical variant hypertrophic cardiomyopathy ([place])    Echo 4/18: Normal LV size with marked apical hypertrophy. LVEF 60-65%. No WMA. Mild MR. Moderate left atrial enlargement. Normal RV size and function. Mild right atrial enlargemen  . Chronic kidney disease   .  DIVERTICULOSIS, COLON   . Dysmetabolic syndrome X   . FIBROMYALGIA   . Gastric ulcer due to Helicobacter pylori   . Hiatal hernia   . Hx of cataract surgery 01/08/2016  . HYPERLIPIDEMIA   . Hyperplastic colon polyp   . HYPERTENSION, ESSENTIAL NOS   . Hyponatremia   . Hypothyroidism   . MYCOSIS FUNGOIDES   . Nodule of left lung   . Normocytic anemia   . OSTEOARTHRITIS   . OSTEOPOROSIS   . Persistent atrial fibrillation    s/p DCCV 2018 // Apixaban for anticoag  . Renal angiomyolipoma   . Unspecified vitamin D deficiency    Surgical Hx: The patient  has a past surgical history that includes G 4 P 2; colonoscopy with polypectomy (07/25/2007); ORIF elbow fracture (Right, 01/08/2016); I&D extremity (Right, 01/08/2016); and Cardioversion (N/A, 01/03/2017).   Current Medications: Current Meds  Medication Sig  . beta carotene w/minerals (OCUVITE) tablet Take 1 tablet by mouth daily.  . carvedilol (COREG) 3.125 MG tablet Take 1 tablet (3.125 mg total) by mouth 2 (two) times daily.  Marland Kitchen ELIQUIS 5 MG TABS tablet TAKE 1 TABLET (5 MG TOTAL) BY MOUTH 2 TIMES DAILY  . ferrous sulfate 325 (65 FE) MG EC tablet Take 325 mg by mouth daily.  . furosemide (LASIX) 20 MG tablet Take 20 mg by mouth as needed.  Marland Kitchen levothyroxine (SYNTHROID, LEVOTHROID) 75 MCG tablet TAKE 1 TAB BY MOUTH ONCE  DAILY WITH EXCEPTION OF TUESDAYS AND SATURDAYS TAKE HALF A TAB AS DIRECTED  . lisinopril (PRINIVIL,ZESTRIL) 40 MG tablet Take 1 tablet (40 mg total) by mouth daily.  Marland Kitchen LORazepam (ATIVAN) 0.5 MG tablet TAKE 1/2 TABLET BY MOUTH DAILY AS NEEDED FOR ANXIETY  . omeprazole (PRILOSEC) 20 MG capsule TAKE ONE CAPSULE BY MOUTH DAILY     Allergies:   Achromycin [tetracycline hcl]; Celecoxib; Clarithromycin; Ezetimibe-simvastatin; Flagyl [metronidazole]; Penicillins; Simvastatin; and Abilify [aripiprazole]   Social History   Tobacco Use  . Smoking status: Former Smoker    Packs/day: 0.50    Years: 14.00    Pack years: 7.00    Last  attempt to quit: 1956    Years since quitting: 63.8  . Smokeless tobacco: Never Used  . Tobacco comment: smoked 1951-1965 , up to 1/2 ppd  Substance Use Topics  . Alcohol use: No    Alcohol/week: 0.0 standard drinks  . Drug use: No     Family Hx: The patient's family history includes Diabetes in her mother; Heart attack (age of onset: 26) in her maternal uncle; Hypertension in her maternal grandmother; Stroke (age of onset: 49) in her maternal grandmother. There is no history of Cancer, Asthma, or COPD.  ROS:   Please see the history of present illness.    ROS All other systems reviewed and are negative.   EKGs/Labs/Other Test Reviewed:    EKG:  EKG is   ordered today.  The ekg ordered today demonstrates sinus bradycardia, HR 58, normal axis, TW inversions 1, 2, aVL, V2-V4, similar to old EKGs - TW changes likely due to apical hypertrophic CM  Recent Labs: 11/15/2017: ALT 8; BUN 13; Creatinine, Ser 0.92; Hemoglobin 11.1; Platelets 294.0; Potassium 4.2; Sodium 134; TSH 1.14   Recent Lipid Panel Lab Results  Component Value Date/Time   CHOL 231 (H) 11/15/2017 12:01 PM   TRIG 154.0 (H) 11/15/2017 12:01 PM   HDL 60.40 11/15/2017 12:01 PM   CHOLHDL 4 11/15/2017 12:01 PM   LDLCALC 140 (H) 11/15/2017 12:01 PM   LDLDIRECT 128.9 09/09/2012 11:29 AM    Physical Exam:    VS:  BP (!) 164/64   Pulse (!) 58   Ht 4\' 10"  (1.473 m)   Wt 151 lb 12.8 oz (68.9 kg)   LMP  (LMP Unknown)   SpO2 98%   BMI 31.73 kg/m     Wt Readings from Last 3 Encounters:  03/07/18 151 lb 12.8 oz (68.9 kg)  02/07/18 149 lb (67.6 kg)  12/27/17 150 lb (68 kg)     Physical Exam  Constitutional: She is oriented to person, place, and time. She appears well-developed and well-nourished. No distress.  HENT:  Head: Normocephalic and atraumatic.  Neck: Neck supple. No JVD present. No thyromegaly present.  Cardiovascular: Normal rate, regular rhythm and S1 normal.  No murmur heard. Pulmonary/Chest: Effort  normal. She has no rales.  Abdominal: Soft. There is no hepatomegaly.  Musculoskeletal: She exhibits no edema.  Lymphadenopathy:    She has no cervical adenopathy.  Neurological: She is alert and oriented to person, place, and time.  Skin: Skin is warm and dry.  Psychiatric: She has a normal mood and affect.    ASSESSMENT & PLAN:    Persistent atrial fibrillation Maintaining normal sinus rhythm.  Continue beta-blocker, Apixaban.  Recent Hgb, Creatinine stable.    Apical variant hypertrophic cardiomyopathy (HCC)  No symptoms. Continue beta-blocker therapy.  As noted, she was not able to tolerate diltiazem.  Essential hypertension   Her blood pressure is above goal.  She does admit to a diet high in salt.  She had a salty meal last night.  I have asked her to maintain a low salt diet.  I have asked her to monitor her blood pressure.  If she has readings consistently > 150/90, she will call.  Consider Amlodipine 2.5 mg QD if needed.    Dispo:  Return in about 6 months (around 09/06/2018) for Routine Follow Up w/ Dr. Irish Lack, or Richardson Dopp, PA-C.   Medication Adjustments/Labs and Tests Ordered: Current medicines are reviewed at length with the patient today.  Concerns regarding medicines are outlined above.  Tests Ordered: Orders Placed This Encounter  Procedures  . EKG 12-Lead   Medication Changes: No orders of the defined types were placed in this encounter.   Signed, Richardson Dopp, PA-C  03/07/2018 12:07 PM    [facility] [place], [place], [place]  [address] Phone: [phone]; Fax: [phone]

## 2018-03-09 ENCOUNTER — Other Ambulatory Visit: Payer: Self-pay | Admitting: Physician Assistant

## 2018-03-10 MED ORDER — CARVEDILOL 3.125 MG PO TABS: 3.1250 mg | ORAL_TABLET | Freq: Two times a day (BID) | ORAL | 3 refills | Status: DC

## 2018-04-21 ENCOUNTER — Telehealth: Payer: Self-pay | Admitting: Internal Medicine

## 2018-04-21 ENCOUNTER — Other Ambulatory Visit: Payer: Self-pay

## 2018-04-21 MED ORDER — ZOSTER VAC RECOMB ADJUVANTED 50 MCG/0.5ML IM SUSR: 0.5000 mL | Freq: Once | INTRAMUSCULAR | 1 refills | Status: AC

## 2018-04-21 NOTE — Telephone Encounter (Signed)
Copied from [place] [id number]. Topic: General - Other >> Apr 21, 2018  9:07 AM Antonieta Iba C wrote: Reason for CRM: pt has an apt in Dec. She would like to know if she could have her shingles injection then?   CB: [phone]

## 2018-04-21 NOTE — Telephone Encounter (Signed)
Phone is busy when I call will try later,  She cannot get this injection here at the office she has to get at her pharmacy, we can send a script in to which ever pharmacy she prefers.

## 2018-04-21 NOTE — Telephone Encounter (Signed)
Spoke to patient and she would like a RX for Shingrix sent to her P[facility].

## 2018-05-13 ENCOUNTER — Telehealth: Payer: Self-pay | Admitting: Physician Assistant

## 2018-05-13 NOTE — Telephone Encounter (Signed)
New Message   Pt c/o medication issue:  1. Name of Medication: Diltiazem  2. How are you currently taking this medication (dosage and times per day)? Take 1 capsule (120 mg total) by mouth daily.  3. Are you having a reaction (difficulty breathing--STAT)? no  4. What is your medication issue? [facility] calling, states the pt is wanting to know if they should still be taking this medication. Please call

## 2018-05-13 NOTE — Telephone Encounter (Signed)
Returned call to [facility] about pt's med, Diltiazem. Pharmacy spoke with pt about medication refill and pt states that she didn't recall this  med and wasn't sure she was suppose to be taking it. Informed pharmacy that per Richardson Dopp, PA-C telephone note on 9/13 to STOP Cardizem. Rx was updated on pharmacy side per [place] from [place].

## 2018-05-17 NOTE — Patient Instructions (Addendum)
  Tests ordered today. Your results will be released to MyChart (or called to you) after review, usually within 72hours after test completion. If any changes need to be made, you will be notified at that same time.  Medications reviewed and updated.  Changes include :   none      Please followup in 6 months   

## 2018-05-17 NOTE — Progress Notes (Signed)
Subjective:    Patient ID: [name], female    DOB: May 27, 1928, 84 y.o.   MRN: [id number]  HPI The patient is here for follow up.  Afib, Hypertension: She is taking her medication daily. She is compliant with a low sodium diet.  She has some shortness of breath with exertion, but overall it has improved.  She denies chest pain, palpitations, edema, and regular headaches. She is trying to stay active, but not exercising regularly.     Hypothyroidism:  She is taking her medication daily.  She denies any recent changes in energy or weight that are unexplained.   GERD, history of gastric ulcer:  She is taking her medication daily as prescribed.  She denies any GERD symptoms and feels her GERD is well controlled.   Iron def anemia:  She is taking iron daily.    Anxiety: She is taking the lorazepam nightly as needed at bedtime. She denies any side effects from the medication. She feels her anxiety is well controlled and she is happy with her current dose of medication.  She does not like being able to take this if needed.   Medications and allergies reviewed with patient and updated if appropriate.  Patient Active Problem List   Diagnosis Date Noted  . Female bladder prolapse 11/15/2017  . Decreased hearing of both ears 11/15/2017  . Iron deficiency anemia 09/10/2017  . Shortness of breath 09/10/2017  . Hyperglycemia 05/10/2017  . Persistent atrial fibrillation 08/20/2016  . GERD (gastroesophageal reflux disease) 02/20/2016  . Left hip pain 01/20/2016  . Peripheral edema 01/20/2016  . Systolic murmur 66/59/9357  . Olecranon fracture 01/07/2016  . Depression 07/27/2013  . Fatigue 07/18/2013  . Anxiety 07/18/2013  . Nodule of left lung 07/18/2013  . Apical variant hypertrophic cardiomyopathy ([place]) 08/23/2009  . Unspecified vitamin D deficiency 01/21/2009  . Chronic kidney disease (CKD), stage III (moderate) ([place]) 01/21/2009  . History of colonic polyps 01/21/2009  .  MYCOSIS FUNGOIDES 05/21/2008  . DIVERTICULOSIS, COLON 05/21/2008  . OSTEOARTHRITIS 06/02/2007  . Hypothyroidism 04/28/2007  . HYPERLIPIDEMIA 04/28/2007  . METABOLIC SYNDROME X 01/77/9390  . Essential hypertension 04/28/2007  . Osteoporosis 04/28/2007  . FIBROMYALGIA 11/14/2006    Current Outpatient Medications on File Prior to Visit  Medication Sig Dispense Refill  . beta carotene w/minerals (OCUVITE) tablet Take 1 tablet by mouth daily.    . carvedilol (COREG) 3.125 MG tablet Take 1 tablet (3.125 mg total) by mouth 2 (two) times daily. 180 tablet 3  . ELIQUIS 5 MG TABS tablet TAKE 1 TABLET (5 MG TOTAL) BY MOUTH 2 TIMES DAILY 60 tablet 6  . ferrous sulfate 325 (65 FE) MG EC tablet Take 325 mg by mouth daily.    . furosemide (LASIX) 20 MG tablet Take 20 mg by mouth as needed.    Marland Kitchen levothyroxine (SYNTHROID, LEVOTHROID) 75 MCG tablet TAKE 1 TAB BY MOUTH ONCE DAILY WITH EXCEPTION OF TUESDAYS AND SATURDAYS TAKE HALF A TAB AS DIRECTED 72 tablet 1  . lisinopril (PRINIVIL,ZESTRIL) 40 MG tablet Take 1 tablet (40 mg total) by mouth daily. 90 tablet 3  . LORazepam (ATIVAN) 0.5 MG tablet TAKE 1/2 TABLET BY MOUTH DAILY AS NEEDED FOR ANXIETY 30 tablet 0  . omeprazole (PRILOSEC) 20 MG capsule TAKE ONE CAPSULE BY MOUTH DAILY 90 capsule 3   No current facility-administered medications on file prior to visit.     Past Medical History:  Diagnosis Date  . Adrenal myelolipoma   .  Apical variant hypertrophic cardiomyopathy ([place])    Echo 4/18: Normal LV size with marked apical hypertrophy. LVEF 60-65%. No WMA. Mild MR. Moderate left atrial enlargement. Normal RV size and function. Mild right atrial enlargemen  . Chronic kidney disease   . DIVERTICULOSIS, COLON   . Dysmetabolic syndrome X   . FIBROMYALGIA   . Gastric ulcer due to Helicobacter pylori   . Hiatal hernia   . Hx of cataract surgery 01/08/2016  . HYPERLIPIDEMIA   . Hyperplastic colon polyp   . HYPERTENSION, ESSENTIAL NOS   . Hyponatremia     . Hypothyroidism   . MYCOSIS FUNGOIDES   . Nodule of left lung   . Normocytic anemia   . OSTEOARTHRITIS   . OSTEOPOROSIS   . Persistent atrial fibrillation    s/p DCCV 2018 // Apixaban for anticoag  . Renal angiomyolipoma   . Unspecified vitamin D deficiency     Past Surgical History:  Procedure Laterality Date  . CARDIOVERSION N/A 01/03/2017   Procedure: CARDIOVERSION;  Surgeon: Josue Hector, MD;  Location: [facility] ENDOSCOPY;  Service: Cardiovascular;  Laterality: N/A;  . colonoscopy with polypectomy  07/25/2007   Dr Olevia Perches, Recheck in 7 years for 2009  . G 4 P 2    . I&D EXTREMITY Right 01/08/2016   Procedure: IRRIGATION AND DEBRIDEMENT OF OLECRANON FRACTURE;  Surgeon: Renette Butters, MD;  Location: [place];  Service: Orthopedics;  Laterality: Right;  . ORIF ELBOW FRACTURE Right 01/08/2016   Procedure: OPEN REDUCTION INTERNAL FIXATION (ORIF) ELBOW/OLECRANON FRACTURE, NEUROLYSIS;  Surgeon: Renette Butters, MD;  Location: [place];  Service: Orthopedics;  Laterality: Right;    Social History   Socioeconomic History  . Marital status: Widowed    Spouse name: Not on file  . Number of children: 2  . Years of education: Not on file  . Highest education level: Not on file  Occupational History  . Occupation: Retired    Fish farm manager: RETIRED  Social Needs  . Financial resource strain: Not on file  . Food insecurity:    Worry: Not on file    Inability: Not on file  . Transportation needs:    Medical: Not on file    Non-medical: Not on file  Tobacco Use  . Smoking status: Former Smoker    Packs/day: 0.50    Years: 14.00    Pack years: 7.00    Last attempt to quit: 1956    Years since quitting: 64.0  . Smokeless tobacco: Never Used  . Tobacco comment: smoked 1951-1965 , up to 1/2 ppd  Substance and Sexual Activity  . Alcohol use: No    Alcohol/week: 0.0 standard drinks  . Drug use: No  . Sexual activity: Never  Lifestyle  . Physical activity:    Days per week: Not on file     Minutes per session: Not on file  . Stress: Not on file  Relationships  . Social connections:    Talks on phone: Not on file    Gets together: Not on file    Attends religious service: Not on file    Active member of club or organization: Not on file    Attends meetings of clubs or organizations: Not on file    Relationship status: Not on file  Other Topics Concern  . Not on file  Social History Narrative   Widowed.  Lives alone.  Ambulates without assistance.  Steady on feet.    Family History  Problem Relation Age of  Onset  . Diabetes Mother   . Heart attack Maternal Uncle 55       his 2 sons had MI in 20s  . Hypertension Maternal Grandmother   . Stroke Maternal Grandmother 69  . Cancer Neg Hx   . Asthma Neg Hx   . COPD Neg Hx     Review of Systems  Constitutional: Negative for chills and fever.  Respiratory: Positive for shortness of breath (chronic, no change). Negative for cough and wheezing.   Cardiovascular: Negative for chest pain, palpitations and leg swelling.  Neurological: Negative for light-headedness and headaches.       Objective:   Vitals:   05/19/18 1059  BP: (!) 154/62  Pulse: 60  Resp: 16  Temp: 98.6 F (37 C)  SpO2: 99%   BP Readings from Last 3 Encounters:  05/19/18 (!) 154/62  03/07/18 (!) 164/64  02/21/18 126/78   Wt Readings from Last 3 Encounters:  05/19/18 152 lb (68.9 kg)  03/07/18 151 lb 12.8 oz (68.9 kg)  02/07/18 149 lb (67.6 kg)   Body mass index is 31.77 kg/m.   Physical Exam    Constitutional: Appears well-developed and well-nourished. No distress.  HENT:  Head: Normocephalic and atraumatic.  Neck: Neck supple. No tracheal deviation present. No thyromegaly present.  No cervical lymphadenopathy Cardiovascular: Normal rate, regular rhythm and normal heart sounds.     No edema Pulmonary/Chest: Effort normal and breath sounds normal. No respiratory distress. No has no wheezes. No rales.  Skin: Skin is warm and dry. Not  diaphoretic.  Psychiatric: Normal mood and affect. Behavior is normal.    PRE-PROCEDURE EXAM: Right TM cannot be visualized due to total occlusion/impaction of the ear canal. PROCEDURE INDICATION: remove wax to visualize ear drum & relieve discomfort, restore hearing CONSENT:  Verbal  PROCEDURE NOTE:   RIGHT EAR:  The CMA used a metal wax curette under direct vision with an otoscope to free the wax bolus from the ear wall and then successfully removed a small bit of wax. The ear was then irrigated with warm water to remove the remaining wax. POST- PROCEDURE EXAM: TMs successfully visualized and found to have no erythema     Assessment & Plan:    See Problem List for Assessment and Plan of chronic medical problems.

## 2018-05-19 ENCOUNTER — Encounter: Payer: Self-pay | Admitting: Internal Medicine

## 2018-05-19 ENCOUNTER — Ambulatory Visit (INDEPENDENT_AMBULATORY_CARE_PROVIDER_SITE_OTHER): Payer: Medicare Other | Admitting: Internal Medicine

## 2018-05-19 ENCOUNTER — Other Ambulatory Visit (INDEPENDENT_AMBULATORY_CARE_PROVIDER_SITE_OTHER): Payer: Medicare Other

## 2018-05-19 VITALS — BP 154/62 | HR 60 | Temp 98.6°F | Resp 16 | Ht <= 58 in | Wt 152.0 lb

## 2018-05-19 DIAGNOSIS — I10 Essential (primary) hypertension: Secondary | ICD-10-CM

## 2018-05-19 DIAGNOSIS — K219 Gastro-esophageal reflux disease without esophagitis: Secondary | ICD-10-CM

## 2018-05-19 DIAGNOSIS — F419 Anxiety disorder, unspecified: Secondary | ICD-10-CM | POA: Diagnosis not present

## 2018-05-19 DIAGNOSIS — D509 Iron deficiency anemia, unspecified: Secondary | ICD-10-CM

## 2018-05-19 DIAGNOSIS — R739 Hyperglycemia, unspecified: Secondary | ICD-10-CM

## 2018-05-19 DIAGNOSIS — E039 Hypothyroidism, unspecified: Secondary | ICD-10-CM

## 2018-05-19 DIAGNOSIS — H9192 Unspecified hearing loss, left ear: Secondary | ICD-10-CM | POA: Diagnosis not present

## 2018-05-19 DIAGNOSIS — I4819 Other persistent atrial fibrillation: Secondary | ICD-10-CM

## 2018-05-19 LAB — COMPREHENSIVE METABOLIC PANEL
ALT: 9 U/L (ref 0–35)
AST: 12 U/L (ref 0–37)
Albumin: 4.3 g/dL (ref 3.5–5.2)
Alkaline Phosphatase: 71 U/L (ref 39–117)
BUN: 17 mg/dL (ref 6–23)
CO2: 26 mEq/L (ref 19–32)
Calcium: 9.4 mg/dL (ref 8.4–10.5)
Chloride: 105 mEq/L (ref 96–112)
Creatinine, Ser: 1.03 mg/dL (ref 0.40–1.20)
GFR: 53.71 mL/min — ABNORMAL LOW (ref >60.00)
Glucose, Bld: 101 mg/dL — ABNORMAL HIGH (ref 70–99)
Potassium: 4.1 mEq/L (ref 3.5–5.1)
Sodium: 139 mEq/L (ref 135–145)
Total Bilirubin: 0.5 mg/dL (ref 0.2–1.2)
Total Protein: 7 g/dL (ref 6.0–8.3)

## 2018-05-19 LAB — CBC WITH DIFFERENTIAL/PLATELET
Basophils Absolute: 0 10*3/uL (ref 0.0–0.1)
Basophils Relative: 0.8 % (ref 0.0–3.0)
Eosinophils Absolute: 0.1 10*3/uL (ref 0.0–0.7)
Eosinophils Relative: 2 % (ref 0.0–5.0)
HEMATOCRIT: 35.5 % — AB (ref 36.0–46.0)
Hemoglobin: 11.5 g/dL — ABNORMAL LOW (ref 12.0–15.0)
Lymphocytes Relative: 24.4 % (ref 12.0–46.0)
Lymphs Abs: 1.3 10*3/uL (ref 0.7–4.0)
MCHC: 32.5 g/dL (ref 30.0–36.0)
MCV: 88.8 fl (ref 78.0–100.0)
Monocytes Absolute: 0.5 10*3/uL (ref 0.1–1.0)
Monocytes Relative: 9.6 % (ref 3.0–12.0)
Neutro Abs: 3.3 10*3/uL (ref 1.4–7.7)
Neutrophils Relative %: 63.2 % (ref 43.0–77.0)
Platelets: 248 10*3/uL (ref 150.0–400.0)
RBC: 4 Mil/uL (ref 3.87–5.11)
RDW: 14.3 % (ref 11.5–15.5)
WBC: 5.3 10*3/uL (ref 4.0–10.5)

## 2018-05-19 LAB — LIPID PANEL
Cholesterol: 232 mg/dL — ABNORMAL HIGH (ref 0–200)
HDL: 52.9 mg/dL (ref >39.00)
LDL Cholesterol: 146 mg/dL — ABNORMAL HIGH (ref 0–99)
NonHDL: 179.08
Total CHOL/HDL Ratio: 4
Triglycerides: 165 mg/dL — ABNORMAL HIGH (ref 0.0–149.0)
VLDL: 33 mg/dL (ref 0.0–40.0)

## 2018-05-19 LAB — HEMOGLOBIN A1C: Hgb A1c MFr Bld: 5.4 % (ref 4.6–6.5)

## 2018-05-19 LAB — IRON,TIBC AND FERRITIN PANEL
%SAT: 13 % (calc) — ABNORMAL LOW (ref 16–45)
Ferritin: 28 ng/mL (ref 16–288)
IRON: 48 ug/dL (ref 45–160)
TIBC: 365 mcg/dL (calc) (ref 250–450)

## 2018-05-19 LAB — TSH: TSH: 1.28 u[IU]/mL (ref 0.35–4.50)

## 2018-05-19 NOTE — Assessment & Plan Note (Signed)
Takes lorazepam as needed only Denies side effects and does well with medication We will continue-advised her to only take this as needed and not regularly

## 2018-05-19 NOTE — Assessment & Plan Note (Signed)
History of hyperglycemia ?Check A1c ?

## 2018-05-19 NOTE — Assessment & Plan Note (Signed)
Taking iron daily No evidence of bleeding On Eliquis Check a CBC, iron panel

## 2018-05-19 NOTE — Assessment & Plan Note (Signed)
Blood pressure elevated here today Heart rate may limit [place] slightly with what medication she can go on Has follow-up with cardiology next month, so we will not make any changes today CMP

## 2018-05-19 NOTE — Assessment & Plan Note (Signed)
Clinically euthyroid Check tsh  Titrate med dose if needed  

## 2018-05-19 NOTE — Assessment & Plan Note (Signed)
Rate controlled, asymptomatic Following with cardiology Taking Eliquis, carvedilol CMP, CBC

## 2018-05-19 NOTE — Assessment & Plan Note (Signed)
Related to impacted cerumen Ear lavage And cerumen removed Hearing improved

## 2018-05-19 NOTE — Assessment & Plan Note (Signed)
No GERD symptoms-has never really had GERD, but did have a gastric ulcer in the past and iron deficiency anemia Continue omeprazole at current dose CBC, iron levels

## 2018-05-20 ENCOUNTER — Encounter: Payer: Self-pay | Admitting: Internal Medicine

## 2018-05-22 ENCOUNTER — Other Ambulatory Visit: Payer: Self-pay | Admitting: Internal Medicine

## 2018-05-22 NOTE — Telephone Encounter (Signed)
Please review for refill. Thanks!  

## 2018-05-27 ENCOUNTER — Other Ambulatory Visit: Payer: Self-pay | Admitting: Internal Medicine

## 2018-05-27 NOTE — Telephone Encounter (Signed)
[place] Controlled Substance Database checked. Last filled on 03/08/18 for 60 days.  Last OV 05/19/18

## 2018-05-29 ENCOUNTER — Other Ambulatory Visit: Payer: Self-pay | Admitting: Internal Medicine

## 2018-06-13 ENCOUNTER — Encounter: Payer: Self-pay | Admitting: Obstetrics & Gynecology

## 2018-06-13 ENCOUNTER — Ambulatory Visit (INDEPENDENT_AMBULATORY_CARE_PROVIDER_SITE_OTHER): Payer: Medicare Other | Admitting: Obstetrics & Gynecology

## 2018-06-13 VITALS — BP 130/86

## 2018-06-13 DIAGNOSIS — Z4689 Encounter for fitting and adjustment of other specified devices: Secondary | ICD-10-CM | POA: Diagnosis not present

## 2018-06-13 DIAGNOSIS — N813 Complete uterovaginal prolapse: Secondary | ICD-10-CM | POA: Diagnosis not present

## 2018-06-13 NOTE — Patient Instructions (Signed)
1. Encounter for pessary maintenance Doing very well with pessary Milex ring #5 with support.  Good fit and no complication.  We will continue with pessary maintenance every 4 months.  Jeniyah, it was a pleasure seeing you today!

## 2018-06-13 NOTE — Progress Notes (Signed)
    [name] 83-May-1929 [id number]        85 y.o.  [id number]   RP: Pessary maintenance  HPI: Well on Milex ring #5 with support for complete uterine prolapse and cystocele.  Using a thin pad for mild urinary urgency and leakage.  No urinary tract infection symptoms.  Occasional odor with vaginal discharge.  No vaginal bleeding.  No pain.  Very satisfied with pessary.   OB History  Gravida Para Term Preterm AB Living  4 2     2 2   SAB TAB Ectopic Multiple Live Births  2            # Outcome Date GA Lbr Len/2nd Weight Sex Delivery Anes PTL Lv  4 SAB           3 SAB           2 Para           1 Para             Past medical history,surgical history, problem list, medications, allergies, family history and social history were all reviewed and documented in the EPIC chart.   Directed ROS with pertinent positives and negatives documented in the history of present illness/assessment and plan.  Exam:  Vitals:   06/13/18 1042  BP: 130/86   General appearance:  Normal   Gynecologic exam: Vulva normal.  Pessary easily removed and cleaned thoroughly.  Vaginal mucosa and cervix normal.  No erosion or ulceration.  Very mild vaginal secretions.  No odor.  Milex ring #5 with support put back in place easily.  Good fit.   Assessment/Plan:  85 y.o. [id number]   1. Encounter for pessary maintenance Doing very well with pessary Milex ring #5 with support.  Good fit and no complication.  We will continue with pessary maintenance every 4 months.  Counseling on above issues and coordination of care more than 50% for 15 minutes.  Princess Bruins MD, 10:58 AM 06/13/2018

## 2018-06-25 ENCOUNTER — Telehealth: Payer: Self-pay | Admitting: Physician Assistant

## 2018-06-25 NOTE — Telephone Encounter (Signed)
New Message:    Patient calling about appt with Dr. Irish Lack  patients she saw Kathlen Mody  A few times. please call patient. For a sooner appt. Patient states that Kathlen Mody told her she could see Dr. Irish Lack. I did not see a referral in chart for him.

## 2018-06-25 NOTE — Telephone Encounter (Signed)
Patient was seen by Richardson Dopp, PA in October 2019 and was instructed to follow up with Dr. Irish Lack in 6 months. Patient is an old Dr. Saunders Revel patient who is transitioning to Dr. Irish Lack. Appointment made for 09/19/18 at 2:00 PM. Patient appreciative fore the call.

## 2018-07-27 ENCOUNTER — Other Ambulatory Visit: Payer: Self-pay | Admitting: Internal Medicine

## 2018-08-27 ENCOUNTER — Other Ambulatory Visit: Payer: Self-pay | Admitting: Internal Medicine

## 2018-08-27 NOTE — Telephone Encounter (Signed)
Please review for refill, Thanks !  

## 2018-09-19 ENCOUNTER — Ambulatory Visit: Payer: Self-pay | Admitting: Interventional Cardiology

## 2018-10-02 ENCOUNTER — Other Ambulatory Visit: Payer: Self-pay | Admitting: Internal Medicine

## 2018-10-02 NOTE — Telephone Encounter (Signed)
Last refill was 07/31/18 Last OV 05/19/18 Next OV 11/21/18

## 2018-10-20 ENCOUNTER — Ambulatory Visit: Payer: Medicare Other | Admitting: Obstetrics & Gynecology

## 2018-10-24 ENCOUNTER — Ambulatory Visit: Payer: Medicare Other | Admitting: Obstetrics & Gynecology

## 2018-10-31 ENCOUNTER — Other Ambulatory Visit: Payer: Self-pay

## 2018-11-03 ENCOUNTER — Encounter: Payer: Self-pay | Admitting: Obstetrics & Gynecology

## 2018-11-03 ENCOUNTER — Other Ambulatory Visit: Payer: Self-pay

## 2018-11-03 ENCOUNTER — Ambulatory Visit (INDEPENDENT_AMBULATORY_CARE_PROVIDER_SITE_OTHER): Payer: Medicare Other | Admitting: Obstetrics & Gynecology

## 2018-11-03 VITALS — BP 130/86

## 2018-11-03 DIAGNOSIS — Z4689 Encounter for fitting and adjustment of other specified devices: Secondary | ICD-10-CM | POA: Diagnosis not present

## 2018-11-03 NOTE — Progress Notes (Signed)
    [name] 08/14/1927 [id number]        85 y.o.  [id number] Widowed x 10 yrs.   RP: Pessary maintenance  HPI: Last pessary check was January 2020.  Well on Milex ring #5 with support.  No complaint.  No pelvic or vaginal pain, no abnormal vaginal discharge, no vaginal bleeding.  Urine and bowel movements normal.  No fever.   OB History  Gravida Para Term Preterm AB Living  4 2     2 2   SAB TAB Ectopic Multiple Live Births  2            # Outcome Date GA Lbr Len/2nd Weight Sex Delivery Anes PTL Lv  4 SAB           3 SAB           2 Para           1 Para             Past medical history,surgical history, problem list, medications, allergies, family history and social history were all reviewed and documented in the EPIC chart.   Directed ROS with pertinent positives and negatives documented in the history of present illness/assessment and plan.  Exam:  Vitals:   11/03/18 1359  BP: 130/86   General appearance:  Normal  Abdomen: Normal  Gynecologic exam: Vulva normal.  Pessary removed easily.  Thoroughly cleaned with antibacterial soap and rinsed.  Vaginal mucosa intact.  Pessary reinserted easily.  Good fit.   Assessment/Plan:  85 y.o. [id number]   1. Pessary maintenance Doing very well with pessary, Milex ring #5 with support.  Good fit.  No pessary complication.  Follow-up in 4 months for pessary maintenance.  Counseling on above issues and coordination of care more than 50% for 15 minutes.  Princess Bruins MD, 2:16 PM 11/03/2018

## 2018-11-08 ENCOUNTER — Encounter: Payer: Self-pay | Admitting: Obstetrics & Gynecology

## 2018-11-08 NOTE — Patient Instructions (Signed)
1. Pessary maintenance Doing very well with pessary, Milex ring #5 with support.  Good fit.  No pessary complication.  Follow-up in 4 months for pessary maintenance.  Lisseth, it was a pleasure seeing you today!

## 2018-11-20 ENCOUNTER — Encounter: Payer: Self-pay | Admitting: Internal Medicine

## 2018-11-20 NOTE — Patient Instructions (Addendum)
  Tests ordered today. Your results will be released to [facility] (or called to you) after review, usually within 72hours after test completion. If any changes need to be made, you will be notified at that same time.   Medications reviewed and updated.  Changes include :   Stop lorazepam.   Start clonazepam 1/2 of a pill twice daily for anxiety   Your prescription(s) have been submitted to your pharmacy. Please take as directed and contact our office if you believe you are having problem(s) with the medication(s).    Please followup in 6 months, sooner if you have any questions or concerns.

## 2018-11-20 NOTE — Progress Notes (Signed)
Subjective:    Patient ID: [name], female    DOB: [birth year], 85 y.o.   MRN: [id number]  HPI The patient is here for follow up.  She is not exercising regularly.     Afib, Hypertension: She is taking her medication daily. She is compliant with a low sodium diet.  She has SOB with exertion.  She denies chest pain, palpitations, edema, and regular headaches.     Hypothyroidism:  She is taking her medication daily.  She denies any recent changes in energy or weight that are unexplained.   GERD:  She is taking her medication daily as prescribed.  She denies any GERD symptoms and feels her GERD is well controlled.   Iron def anemia:  She is taking iron daily.    Anxiety: She is taking her ativan as needed.  She denies any side effects from the medication. She still feels anxious.  She thinks about the past and thinks about all her friends that are deceased.      CKD, stage 3:  She is drinking some water during the day.  She only take tylenol if needed.      Medications and allergies reviewed with patient and updated if appropriate.  Patient Active Problem List   Diagnosis Date Noted  . Female bladder prolapse 11/15/2017  . Decreased hearing of left ear 11/15/2017  . Iron deficiency anemia 09/10/2017  . Shortness of breath 09/10/2017  . Hyperglycemia 05/10/2017  . Persistent atrial fibrillation 08/20/2016  . GERD (gastroesophageal reflux disease) 02/20/2016  . Left hip pain 01/20/2016  . Peripheral edema 01/20/2016  . Systolic murmur 28/41/3244  . Depression 07/27/2013  . Anxiety 07/18/2013  . Nodule of left lung 07/18/2013  . Apical variant hypertrophic cardiomyopathy ([place]) 08/23/2009  . Unspecified vitamin D deficiency 01/21/2009  . Chronic kidney disease (CKD), stage III (moderate) ([place]) 01/21/2009  . MYCOSIS FUNGOIDES 05/21/2008  . DIVERTICULOSIS, COLON 05/21/2008  . OSTEOARTHRITIS 06/02/2007  . Hypothyroidism 04/28/2007  . HYPERLIPIDEMIA 04/28/2007  .  Essential hypertension 04/28/2007  . Osteoporosis 04/28/2007  . FIBROMYALGIA 11/14/2006    Current Outpatient Medications on File Prior to Visit  Medication Sig Dispense Refill  . beta carotene w/minerals (OCUVITE) tablet Take 1 tablet by mouth daily.    . carvedilol (COREG) 3.125 MG tablet Take 1 tablet (3.125 mg total) by mouth 2 (two) times daily. 180 tablet 3  . ELIQUIS 5 MG TABS tablet TAKE 1 TABLET (5 MG TOTAL) BY MOUTH 2 TIMES DAILY 60 tablet 6  . ferrous sulfate 325 (65 FE) MG EC tablet Take 325 mg by mouth daily.    . furosemide (LASIX) 20 MG tablet Take 20 mg by mouth as needed.    Marland Kitchen levothyroxine (SYNTHROID, LEVOTHROID) 75 MCG tablet TAKE 1 TAB BY MOUTH ONCE DAILY WITH EXCEPTION OF TUESDAYS AND SATURDAYS TAKE HALF A TAB AS DIRECTED 72 tablet 1  . lisinopril (PRINIVIL,ZESTRIL) 40 MG tablet TAKE 1 TABLET BY MOUTH EVERY DAY 90 tablet 2  . omeprazole (PRILOSEC) 20 MG capsule TAKE ONE CAPSULE BY MOUTH DAILY 90 capsule 3   No current facility-administered medications on file prior to visit.     Past Medical History:  Diagnosis Date  . Adrenal myelolipoma   . Apical variant hypertrophic cardiomyopathy ([place])    Echo 4/18: Normal LV size with marked apical hypertrophy. LVEF 60-65%. No WMA. Mild MR. Moderate left atrial enlargement. Normal RV size and function. Mild right atrial enlargemen  . Chronic kidney disease   .  DIVERTICULOSIS, COLON   . Dysmetabolic syndrome X   . FIBROMYALGIA   . Gastric ulcer due to Helicobacter pylori   . Hiatal hernia   . Hx of cataract surgery 01/08/2016  . HYPERLIPIDEMIA   . Hyperplastic colon polyp   . HYPERTENSION, ESSENTIAL NOS   . Hyponatremia   . Hypothyroidism   . MYCOSIS FUNGOIDES   . Nodule of left lung   . Normocytic anemia   . Olecranon fracture 01/07/2016  . OSTEOARTHRITIS   . OSTEOPOROSIS   . Persistent atrial fibrillation    s/p DCCV 2018 // Apixaban for anticoag  . Renal angiomyolipoma   . Unspecified vitamin D deficiency      Past Surgical History:  Procedure Laterality Date  . CARDIOVERSION N/A 01/03/2017   Procedure: CARDIOVERSION;  Surgeon: Josue Hector, MD;  Location: [facility] ENDOSCOPY;  Service: Cardiovascular;  Laterality: N/A;  . colonoscopy with polypectomy  07/25/2007   Dr Olevia Perches, Recheck in 7 years for 2009  . G 4 P 2    . I&D EXTREMITY Right 01/08/2016   Procedure: IRRIGATION AND DEBRIDEMENT OF OLECRANON FRACTURE;  Surgeon: Renette Butters, MD;  Location: [place];  Service: Orthopedics;  Laterality: Right;  . ORIF ELBOW FRACTURE Right 01/08/2016   Procedure: OPEN REDUCTION INTERNAL FIXATION (ORIF) ELBOW/OLECRANON FRACTURE, NEUROLYSIS;  Surgeon: Renette Butters, MD;  Location: [place];  Service: Orthopedics;  Laterality: Right;    Social History   Socioeconomic History  . Marital status: Widowed    Spouse name: Not on file  . Number of children: 2  . Years of education: Not on file  . Highest education level: Not on file  Occupational History  . Occupation: Retired    Fish farm manager: RETIRED  Social Needs  . Financial resource strain: Not on file  . Food insecurity    Worry: Not on file    Inability: Not on file  . Transportation needs    Medical: Not on file    Non-medical: Not on file  Tobacco Use  . Smoking status: Former Smoker    Packs/day: 0.50    Years: 14.00    Pack years: 7.00    Quit date: 1956    Years since quitting: 64.5  . Smokeless tobacco: Never Used  . Tobacco comment: smoked 1951-1965 , up to 1/2 ppd  Substance and Sexual Activity  . Alcohol use: No    Alcohol/week: 0.0 standard drinks  . Drug use: No  . Sexual activity: Never  Lifestyle  . Physical activity    Days per week: Not on file    Minutes per session: Not on file  . Stress: Not on file  Relationships  . Social Herbalist on phone: Not on file    Gets together: Not on file    Attends religious service: Not on file    Active member of club or organization: Not on file    Attends meetings of clubs or  organizations: Not on file    Relationship status: Not on file  Other Topics Concern  . Not on file  Social History Narrative   Widowed.  Lives alone.  Ambulates without assistance.  Steady on feet.    Family History  Problem Relation Age of Onset  . Diabetes Mother   . Heart attack Maternal Uncle 88       his 2 sons had MI in 27s  . Hypertension Maternal Grandmother   . Stroke Maternal Grandmother 69  . Cancer Neg  Hx   . Asthma Neg Hx   . COPD Neg Hx     Review of Systems  Constitutional: Negative for chills and fever.  Respiratory: Positive for shortness of breath. Negative for cough and wheezing.   Cardiovascular: Positive for leg swelling (mild). Negative for chest pain and palpitations.  Neurological: Negative for light-headedness and headaches.       Objective:   Vitals:   11/21/18 1107  BP: (!) 162/80  Pulse: 69  Temp: 97.7 F (36.5 C)  SpO2: 98%   BP Readings from Last 3 Encounters:  11/21/18 (!) 162/80  11/03/18 130/86  06/13/18 130/86   Wt Readings from Last 3 Encounters:  11/21/18 154 lb (69.9 kg)  05/19/18 152 lb (68.9 kg)  03/07/18 151 lb 12.8 oz (68.9 kg)   Body mass index is 32.19 kg/m.   Physical Exam    Constitutional: Appears well-developed and well-nourished. No distress.  HENT:  Head: Normocephalic and atraumatic.  Neck: Neck supple. No tracheal deviation present. No thyromegaly present.  No cervical lymphadenopathy Cardiovascular: Normal rate, regular rhythm and normal heart sounds.   No murmur heard. No carotid bruit .  No edema Pulmonary/Chest: Effort normal and breath sounds normal. No respiratory distress. No has no wheezes. No rales.  Skin: Skin is warm and dry. Not diaphoretic.  Psychiatric: Slightly anxious mood and affect. Behavior is normal.      Assessment & Plan:    See Problem List for Assessment and Plan of chronic medical problems.

## 2018-11-21 ENCOUNTER — Encounter: Payer: Self-pay | Admitting: Internal Medicine

## 2018-11-21 ENCOUNTER — Other Ambulatory Visit: Payer: Self-pay

## 2018-11-21 ENCOUNTER — Ambulatory Visit (INDEPENDENT_AMBULATORY_CARE_PROVIDER_SITE_OTHER): Payer: Medicare Other | Admitting: Internal Medicine

## 2018-11-21 ENCOUNTER — Other Ambulatory Visit (INDEPENDENT_AMBULATORY_CARE_PROVIDER_SITE_OTHER): Payer: Medicare Other

## 2018-11-21 VITALS — BP 162/80 | HR 69 | Temp 97.7°F | Ht <= 58 in | Wt 154.0 lb

## 2018-11-21 DIAGNOSIS — R739 Hyperglycemia, unspecified: Secondary | ICD-10-CM

## 2018-11-21 DIAGNOSIS — D509 Iron deficiency anemia, unspecified: Secondary | ICD-10-CM | POA: Diagnosis not present

## 2018-11-21 DIAGNOSIS — E039 Hypothyroidism, unspecified: Secondary | ICD-10-CM | POA: Diagnosis not present

## 2018-11-21 DIAGNOSIS — I10 Essential (primary) hypertension: Secondary | ICD-10-CM | POA: Diagnosis not present

## 2018-11-21 DIAGNOSIS — F419 Anxiety disorder, unspecified: Secondary | ICD-10-CM

## 2018-11-21 DIAGNOSIS — K219 Gastro-esophageal reflux disease without esophagitis: Secondary | ICD-10-CM

## 2018-11-21 DIAGNOSIS — N183 Chronic kidney disease, stage 3 unspecified: Secondary | ICD-10-CM

## 2018-11-21 DIAGNOSIS — R609 Edema, unspecified: Secondary | ICD-10-CM

## 2018-11-21 DIAGNOSIS — R600 Localized edema: Secondary | ICD-10-CM

## 2018-11-21 DIAGNOSIS — I4819 Other persistent atrial fibrillation: Secondary | ICD-10-CM

## 2018-11-21 LAB — LIPID PANEL
Cholesterol: 206 mg/dL — ABNORMAL HIGH (ref 0–200)
HDL: 52.1 mg/dL (ref >39.00)
LDL Cholesterol: 125 mg/dL — ABNORMAL HIGH (ref 0–99)
NonHDL: 153.55
Total CHOL/HDL Ratio: 4
Triglycerides: 145 mg/dL (ref 0.0–149.0)
VLDL: 29 mg/dL (ref 0.0–40.0)

## 2018-11-21 LAB — CBC WITH DIFFERENTIAL/PLATELET
Basophils Absolute: 0 10*3/uL (ref 0.0–0.1)
Basophils Relative: 0.4 % (ref 0.0–3.0)
Eosinophils Absolute: 0.1 10*3/uL (ref 0.0–0.7)
Eosinophils Relative: 1.2 % (ref 0.0–5.0)
HCT: 33.5 % — ABNORMAL LOW (ref 36.0–46.0)
Hemoglobin: 11.5 g/dL — ABNORMAL LOW (ref 12.0–15.0)
Lymphocytes Relative: 19.9 % (ref 12.0–46.0)
Lymphs Abs: 1.2 10*3/uL (ref 0.7–4.0)
MCHC: 34.2 g/dL (ref 30.0–36.0)
MCV: 88.9 fl (ref 78.0–100.0)
Monocytes Absolute: 0.5 10*3/uL (ref 0.1–1.0)
Monocytes Relative: 8.4 % (ref 3.0–12.0)
Neutro Abs: 4.3 10*3/uL (ref 1.4–7.7)
Neutrophils Relative %: 70.1 % (ref 43.0–77.0)
Platelets: 230 10*3/uL (ref 150.0–400.0)
RBC: 3.77 Mil/uL — ABNORMAL LOW (ref 3.87–5.11)
RDW: 13.9 % (ref 11.5–15.5)
WBC: 6.2 10*3/uL (ref 4.0–10.5)

## 2018-11-21 LAB — COMPREHENSIVE METABOLIC PANEL
ALT: 12 U/L (ref 0–35)
AST: 13 U/L (ref 0–37)
Albumin: 4.2 g/dL (ref 3.5–5.2)
Alkaline Phosphatase: 67 U/L (ref 39–117)
BUN: 14 mg/dL (ref 6–23)
CO2: 25 mEq/L (ref 19–32)
Calcium: 9.4 mg/dL (ref 8.4–10.5)
Chloride: 100 mEq/L (ref 96–112)
Creatinine, Ser: 0.96 mg/dL (ref 0.40–1.20)
GFR: 54.75 mL/min — ABNORMAL LOW (ref >60.00)
Glucose, Bld: 92 mg/dL (ref 70–99)
Potassium: 4.6 mEq/L (ref 3.5–5.1)
Sodium: 133 mEq/L — ABNORMAL LOW (ref 135–145)
Total Bilirubin: 0.5 mg/dL (ref 0.2–1.2)
Total Protein: 6.6 g/dL (ref 6.0–8.3)

## 2018-11-21 LAB — HEMOGLOBIN A1C: Hgb A1c MFr Bld: 5.7 % (ref 4.6–6.5)

## 2018-11-21 LAB — TSH: TSH: 0.96 u[IU]/mL (ref 0.35–4.50)

## 2018-11-21 MED ORDER — CLONAZEPAM 0.5 MG PO TABS: 0.2500 mg | ORAL_TABLET | Freq: Two times a day (BID) | ORAL | 0 refills | Status: DC

## 2018-11-21 NOTE — Assessment & Plan Note (Signed)
cmp

## 2018-11-21 NOTE — Assessment & Plan Note (Signed)
In sinus rhythm On coreg, eliquis Cbc, cmp

## 2018-11-21 NOTE — Assessment & Plan Note (Signed)
GERD controlled Continue daily medication  

## 2018-11-21 NOTE — Assessment & Plan Note (Signed)
A1c

## 2018-11-21 NOTE — Assessment & Plan Note (Signed)
Not controlled  Has been on Wellbutrin and did not tolerate it Has been on sertraline and had severe hyponatremia and ended up in the emergency room Would like to avoid SSRIs for that reason She does feel that she needs something to help with her anxiety Discontinue lorazepam that she takes only as needed We will try low-dose clonazepam twice daily-0.25 mg twice daily

## 2018-11-21 NOTE — Assessment & Plan Note (Signed)
Taking iron daily Check CBC, iron panel

## 2018-11-21 NOTE — Assessment & Plan Note (Signed)
Overall BP appears to be controlled, but is elevated here today Will continue current medication Has cardiology follow up Cbc, cmp

## 2018-11-21 NOTE — Assessment & Plan Note (Signed)
Clinically euthyroid Check tsh  Titrate med dose if needed  

## 2018-11-21 NOTE — Assessment & Plan Note (Signed)
Controlled, no edema on exam Takes Lasix only as needed CMP

## 2018-11-22 ENCOUNTER — Encounter: Payer: Self-pay | Admitting: Internal Medicine

## 2018-11-22 LAB — IRON,TIBC AND FERRITIN PANEL
%SAT: 22 % (calc) (ref 16–45)
Ferritin: 24 ng/mL (ref 16–288)
Iron: 73 ug/dL (ref 45–160)
TIBC: 334 mcg/dL (calc) (ref 250–450)

## 2018-11-23 ENCOUNTER — Other Ambulatory Visit: Payer: Self-pay | Admitting: Internal Medicine

## 2018-11-24 ENCOUNTER — Ambulatory Visit: Payer: Self-pay | Admitting: Internal Medicine

## 2018-11-24 MED ORDER — LORAZEPAM 0.5 MG PO TABS: ORAL_TABLET | ORAL | 1 refills | Status: DC

## 2018-11-24 NOTE — Telephone Encounter (Signed)
Yes - stop clonazepam and restart lorazepam

## 2018-11-24 NOTE — Telephone Encounter (Signed)
Pt. Reports she took one of her Clonazepam yesterday and had shortness of breath and felt weak. Did not take anymore and feels better this morning. "I'm not going to take anymore of that." Wants to know if she can go back on her Lorazepam. Please advise pt. Answer Assessment - Initial Assessment Questions 1. SYMPTOMS: "Do you have any symptoms?"     Had shortness and felt weak 2. SEVERITY: If symptoms are present, ask "Are they mild, moderate or severe?"     Moderate  Protocols used: MEDICATION QUESTION CALL-A-AH

## 2018-11-24 NOTE — Telephone Encounter (Signed)
Documented in result note.  

## 2018-11-24 NOTE — Telephone Encounter (Signed)
Pt aware of response.  

## 2018-11-24 NOTE — Telephone Encounter (Signed)
Message error below. Please advise on if pt can take lorazepam instead of clonazepam.

## 2018-11-24 NOTE — Addendum Note (Signed)
Addended by: Binnie Rail on: 11/24/2018 11:26 AM   Modules accepted: Orders

## 2018-11-26 ENCOUNTER — Telehealth: Payer: Self-pay

## 2018-11-26 NOTE — Telephone Encounter (Signed)
Called pt to complete covid pre screen questions. If pt calls back please ask pre screen questions and document thank you.

## 2018-11-27 ENCOUNTER — Other Ambulatory Visit: Payer: Self-pay | Admitting: Internal Medicine

## 2018-11-27 NOTE — Telephone Encounter (Signed)
Last OV 11/21/18 Last RF 10/02/18

## 2018-11-27 NOTE — Telephone Encounter (Signed)
Follow up      [place]  1. Do you currently have a fever?  a. NO  2. Have you recently traveled on a cruise, internationally, or to [place], [place], [place], [place], [place], or [place], [place] [employer])?  a. NO  3. Have you been in contact with someone that is currently pending confirmation of Covid19 testing or has been confirmed to have the [place] virus?  a. NO  4. Are you currently experiencing fatigue or cough?  a. NO  Pt needs her daughter to come to appointment for assistance

## 2018-12-01 ENCOUNTER — Other Ambulatory Visit: Payer: Self-pay

## 2018-12-01 ENCOUNTER — Ambulatory Visit (INDEPENDENT_AMBULATORY_CARE_PROVIDER_SITE_OTHER): Payer: Medicare Other | Admitting: Interventional Cardiology

## 2018-12-01 ENCOUNTER — Encounter: Payer: Self-pay | Admitting: Interventional Cardiology

## 2018-12-01 VITALS — BP 152/64 | HR 64 | Ht <= 58 in | Wt 156.0 lb

## 2018-12-01 DIAGNOSIS — I422 Other hypertrophic cardiomyopathy: Secondary | ICD-10-CM

## 2018-12-01 DIAGNOSIS — I4819 Other persistent atrial fibrillation: Secondary | ICD-10-CM | POA: Diagnosis not present

## 2018-12-01 DIAGNOSIS — I10 Essential (primary) hypertension: Secondary | ICD-10-CM

## 2018-12-01 MED ORDER — APIXABAN 5 MG PO TABS: ORAL_TABLET | ORAL | 1 refills | Status: DC

## 2018-12-01 NOTE — Progress Notes (Signed)
Cardiology Office Note   Date:  12/01/2018   ID:  Amber, [name] 1927/10/14, MRN [id number]  PCP:  Binnie Rail, MD    No chief complaint on file.  Atrial fibrillation  Wt Readings from Last 3 Encounters:  12/01/18 156 lb (70.8 kg)  11/21/18 154 lb (69.9 kg)  05/19/18 152 lb (68.9 kg)       History of Present Illness: [name] is a 85 y.o. female  with persistent atrial fibrillation, apical variant hypertrophic cardiomyopathy, hypertension, hyperlipidemia, chronic kidney disease, anemia. She underwent cardioversion in 2018.  Her beta-blocker dose has been reduced due to symptoms of fatigue and dyspnea on exertion.  She was feeling better at her last visit with me in 02/2018.  We decided to try to switch her from a beta-blocker to a calcium channel blocker.  However, she could not tolerate this and was placed back on her beta-blocker.    CHADS2-VASc=4  She saw Richardson Dopp in 10/19.  Denies : Chest pain. Dizziness. Leg edema. Nitroglycerin use. Orthopnea. Palpitations. Paroxysmal nocturnal dyspnea.  Syncope.   Past Medical History:  Diagnosis Date  . Adrenal myelolipoma   . Apical variant hypertrophic cardiomyopathy ([place])    Echo 4/18: Normal LV size with marked apical hypertrophy. LVEF 60-65%. No WMA. Mild MR. Moderate left atrial enlargement. Normal RV size and function. Mild right atrial enlargemen  . Chronic kidney disease   . DIVERTICULOSIS, COLON   . Dysmetabolic syndrome X   . FIBROMYALGIA   . Gastric ulcer due to Helicobacter pylori   . Hiatal hernia   . Hx of cataract surgery 01/08/2016  . HYPERLIPIDEMIA   . Hyperplastic colon polyp   . HYPERTENSION, ESSENTIAL NOS   . Hyponatremia   . Hypothyroidism   . MYCOSIS FUNGOIDES   . Nodule of left lung   . Normocytic anemia   . Olecranon fracture 01/07/2016  . OSTEOARTHRITIS   . OSTEOPOROSIS   . Persistent atrial fibrillation    s/p DCCV 2018 // Apixaban for anticoag  . Renal angiomyolipoma   .  Unspecified vitamin D deficiency     Past Surgical History:  Procedure Laterality Date  . CARDIOVERSION N/A 01/03/2017   Procedure: CARDIOVERSION;  Surgeon: Josue Hector, MD;  Location: [facility] ENDOSCOPY;  Service: Cardiovascular;  Laterality: N/A;  . colonoscopy with polypectomy  07/25/2007   Dr Olevia Perches, Recheck in 7 years for 2009  . G 4 P 2    . I&D EXTREMITY Right 01/08/2016   Procedure: IRRIGATION AND DEBRIDEMENT OF OLECRANON FRACTURE;  Surgeon: Renette Butters, MD;  Location: [place];  Service: Orthopedics;  Laterality: Right;  . ORIF ELBOW FRACTURE Right 01/08/2016   Procedure: OPEN REDUCTION INTERNAL FIXATION (ORIF) ELBOW/OLECRANON FRACTURE, NEUROLYSIS;  Surgeon: Renette Butters, MD;  Location: [place];  Service: Orthopedics;  Laterality: Right;     Current Outpatient Medications  Medication Sig Dispense Refill  . beta carotene w/minerals (OCUVITE) tablet Take 1 tablet by mouth daily.    . carvedilol (COREG) 3.125 MG tablet Take 1 tablet (3.125 mg total) by mouth 2 (two) times daily. 180 tablet 3  . ELIQUIS 5 MG TABS tablet TAKE 1 TABLET (5 MG TOTAL) BY MOUTH 2 TIMES DAILY 60 tablet 6  . ferrous sulfate 325 (65 FE) MG EC tablet Take 325 mg by mouth daily.    . furosemide (LASIX) 20 MG tablet Take 20 mg by mouth as needed.    Marland Kitchen levothyroxine (SYNTHROID) 75 MCG tablet  TAKE 1 TABLET BY MOUTH DAILY EXCEPT TAKE 1/2 TABLET ON TUESDAYS AND SATURDAY 72 tablet 1  . lisinopril (PRINIVIL,ZESTRIL) 40 MG tablet TAKE 1 TABLET BY MOUTH EVERY DAY 90 tablet 2  . LORazepam (ATIVAN) 0.5 MG tablet TAKE 1/2 TABLET BY MOUTH DAILY AS NEEDED FOR ANXIETY 30 tablet 1  . omeprazole (PRILOSEC) 20 MG capsule TAKE ONE CAPSULE BY MOUTH DAILY 90 capsule 3   No current facility-administered medications for this visit.     Allergies:   Achromycin [tetracycline hcl], Celecoxib, Clarithromycin, Ezetimibe-simvastatin, Flagyl [metronidazole], Penicillins, Simvastatin, Clonazepam, Erythromycin, Sertraline, and Abilify  [aripiprazole]    Social History:  The patient  reports that she quit smoking about 64 years ago. She has a 7.00 pack-year smoking history. She has never used smokeless tobacco. She reports that she does not drink alcohol or use drugs.   Family History:  The patient's family history includes Diabetes in her mother; Heart attack (age of onset: 45) in her maternal uncle; Hypertension in her maternal grandmother; Stroke (age of onset: 37) in her maternal grandmother.    ROS:  Please see the history of present illness.   Otherwise, review of systems are positive for fatigue.   All other systems are reviewed and negative.    PHYSICAL EXAM: VS:  BP (!) 152/64   Pulse 64   Ht 4\' 10"  (1.473 m)   Wt 156 lb (70.8 kg)   LMP  (LMP Unknown)   SpO2 98%   BMI 32.60 kg/m  , BMI Body mass index is 32.6 kg/m. GEN: Well nourished, well developed, in no acute distress  HEENT: normal  Neck: no JVD, carotid bruits, or masses Cardiac: RRR; no murmurs, rubs, or gallops,no edema  Respiratory:  clear to auscultation bilaterally, normal work of breathing GI: soft, nontender, nondistended, + BS MS: no deformity or atrophy  Skin: warm and dry, no rash Neuro:  Strength and sensation are intact Psych: euthymic mood, full affect   EKG:   The ekg ordered today demonstrates NSR, no ST changes   Recent Labs: 11/21/2018: ALT 12; BUN 14; Creatinine, Ser 0.96; Hemoglobin 11.5; Platelets 230.0; Potassium 4.6; Sodium 133; TSH 0.96   Lipid Panel    Component Value Date/Time   CHOL 206 (H) 11/21/2018 1141   TRIG 145.0 11/21/2018 1141   HDL 52.10 11/21/2018 1141   CHOLHDL 4 11/21/2018 1141   VLDL 29.0 11/21/2018 1141   LDLCALC 125 (H) 11/21/2018 1141   LDLDIRECT 128.9 09/09/2012 1129     Other studies Reviewed: Additional studies/ records that were reviewed today with results demonstrating: LVEF 60-65%.   ASSESSMENT AND PLAN:  1. AFib: In NSR currently. No palpitations.  No CHF sx.  Fatigue is likely  age related and from deconditioning. 2. Apical variant hypertrophic cardiomyopathy: Not retaining fluid typically. NO syncope. 3. HTN: Typically in the 140-150 range.  4. COntiue to avoid falling.  She walks in the house.     Current medicines are reviewed at length with the patient today.  The patient concerns regarding her medicines were addressed.  The following changes have been made:  No change  Labs/ tests ordered today include:  No orders of the defined types were placed in this encounter.   Recommend 150 minutes/week of aerobic exercise Low fat, low carb, high fiber diet recommended  Disposition:   FU in 1 year   Signed, Larae Grooms, MD  12/01/2018 3:39 PM    [facility] [place], [place]  [address] Phone: [phone]; Fax: [phone]

## 2018-12-01 NOTE — Patient Instructions (Signed)
Medication Instructions:  Your physician recommends that you continue on your current medications as directed. Please refer to the Current Medication list given to you today.  If you need a refill on your cardiac medications before your next appointment, please call your pharmacy.   Lab work:  None ordered today  Testing/Procedures:  None ordered today  Follow-Up: At [facility], you and your health needs are our priority.  As part of our continuing mission to provide you with exceptional heart care, we have created designated Provider Care Teams.  These Care Teams include your primary Cardiologist (physician) and Advanced Practice Providers (APPs -  Physician Assistants and Nurse Practitioners) who all work together to provide you with the care you need, when you need it. You will need a follow up appointment in 12 months.  Please call our office 2 months in advance to schedule this appointment.  You may see Larae Grooms, MD or one of the following Advanced Practice Providers on your designated Care Team:   [place], PA-C Melina Copa, PA-C . Ermalinda Barrios, PA-C

## 2018-12-30 DIAGNOSIS — Z961 Presence of intraocular lens: Secondary | ICD-10-CM | POA: Diagnosis not present

## 2018-12-30 DIAGNOSIS — H04123 Dry eye syndrome of bilateral lacrimal glands: Secondary | ICD-10-CM | POA: Diagnosis not present

## 2018-12-30 DIAGNOSIS — H353132 Nonexudative age-related macular degeneration, bilateral, intermediate dry stage: Secondary | ICD-10-CM | POA: Diagnosis not present

## 2018-12-30 DIAGNOSIS — H26493 Other secondary cataract, bilateral: Secondary | ICD-10-CM | POA: Diagnosis not present

## 2019-01-06 DIAGNOSIS — D1801 Hemangioma of skin and subcutaneous tissue: Secondary | ICD-10-CM | POA: Diagnosis not present

## 2019-01-06 DIAGNOSIS — Z85828 Personal history of other malignant neoplasm of skin: Secondary | ICD-10-CM | POA: Diagnosis not present

## 2019-01-06 DIAGNOSIS — L918 Other hypertrophic disorders of the skin: Secondary | ICD-10-CM | POA: Diagnosis not present

## 2019-01-06 DIAGNOSIS — D2272 Melanocytic nevi of left lower limb, including hip: Secondary | ICD-10-CM | POA: Diagnosis not present

## 2019-01-06 DIAGNOSIS — L570 Actinic keratosis: Secondary | ICD-10-CM | POA: Diagnosis not present

## 2019-01-06 DIAGNOSIS — L821 Other seborrheic keratosis: Secondary | ICD-10-CM | POA: Diagnosis not present

## 2019-01-06 DIAGNOSIS — L814 Other melanin hyperpigmentation: Secondary | ICD-10-CM | POA: Diagnosis not present

## 2019-01-06 DIAGNOSIS — D692 Other nonthrombocytopenic purpura: Secondary | ICD-10-CM | POA: Diagnosis not present

## 2019-01-07 DIAGNOSIS — Z23 Encounter for immunization: Secondary | ICD-10-CM | POA: Diagnosis not present

## 2019-03-06 ENCOUNTER — Ambulatory Visit: Payer: Medicare Other | Admitting: Obstetrics & Gynecology

## 2019-03-10 ENCOUNTER — Telehealth: Payer: Self-pay

## 2019-03-10 ENCOUNTER — Ambulatory Visit (INDEPENDENT_AMBULATORY_CARE_PROVIDER_SITE_OTHER): Payer: Medicare Other | Admitting: Obstetrics & Gynecology

## 2019-03-10 ENCOUNTER — Encounter: Payer: Self-pay | Admitting: Obstetrics & Gynecology

## 2019-03-10 ENCOUNTER — Other Ambulatory Visit: Payer: Self-pay

## 2019-03-10 VITALS — BP 134/84

## 2019-03-10 DIAGNOSIS — N898 Other specified noninflammatory disorders of vagina: Secondary | ICD-10-CM | POA: Diagnosis not present

## 2019-03-10 DIAGNOSIS — Z4689 Encounter for fitting and adjustment of other specified devices: Secondary | ICD-10-CM

## 2019-03-10 LAB — WET PREP FOR TRICH, YEAST, CLUE

## 2019-03-10 MED ORDER — CLEOCIN 100 MG VA SUPP: 100.0000 mg | Freq: Every day | VAGINAL | 0 refills | Status: DC

## 2019-03-10 NOTE — Progress Notes (Signed)
    [name] 13-Dec-1927 [name]        85 y.o.  [name]   RP: Pessary maintenance  HPI: Well with Milex ring #5 with support.  Only complaint is vaginal odor.  No pelvic/vaginal pain.  No vaginal bleeding.  No increase in the amount of vaginal discharge.  No leakage of urine or UTI Sx.  No fever.   OB History  Gravida Para Term Preterm AB Living  4 2     2 2   SAB TAB Ectopic Multiple Live Births  2            # Outcome Date GA Lbr Len/2nd Weight Sex Delivery Anes PTL Lv  4 SAB           3 SAB           2 Para           1 Para             Past medical history,surgical history, problem list, medications, allergies, family history and social history were all reviewed and documented in the EPIC chart.   Directed ROS with pertinent positives and negatives documented in the history of present illness/assessment and plan.  Exam:  Vitals:   03/10/19 1506  BP: 134/84   General appearance:  Normal  Gynecologic exam: Vulva normal.  Vaginal mucosa intact.  Mild increase in vaginal discharge.  Wet prep done on vaginal discharge.  Pessary removed easily, cleaned and put back in place.  Wet prep: Clue cells present.   Assessment/Plan:  85 y.o. [name]   1. Pessary maintenance Well with Milex ring #5 with support.  Pessary maintenance done today.  Patient will continue the same.  Follow-up in 4 months.  2. Vaginal odor Bacterial vaginosis confirmed with wet prep.  Will treat with clindamycin vaginal cream x3 nights.  Prescription sent to pharmacy.  WET PREP FOR [name], YEAST, CLUE  Other orders - clindamycin (CLEOCIN) 100 MG vaginal cream vaginally at bedtime for 3 days.  Counseling on above issues and coordination of care more than 50% for 15 minutes. Princess Bruins MD, 3:25 PM 03/10/2019

## 2019-03-10 NOTE — Telephone Encounter (Signed)
Cleocin vag suppositories called in but her mother would rather have an oral Rx If possible. She said her mom said even a cream would be better.  Has never even used a tampon daughter is not sure she will know how to use it.

## 2019-03-10 NOTE — Telephone Encounter (Signed)
I changed the treatment because she is allergic to many antibiotics.  Thought a vaginal treatment with Clindamycin would be safer.  Very easy to push a suppository in the vagina.  But if prefers, can send Cleocin vaginal cream instead. Clindamycin 300 mg 1 tab BID x 5 days is the alternative, but if develops an allergy, more risks per mouth.

## 2019-03-11 MED ORDER — CLINDAMYCIN PHOSPHATE 2 % VA CREA: 1.0000 | TOPICAL_CREAM | Freq: Every day | VAGINAL | 0 refills | Status: DC

## 2019-03-11 NOTE — Telephone Encounter (Signed)
Spoke with daughter. She agreed that patient is allergic to a lot of antibiotics and she would rather not chance the oral.  She asked if we would send the cream.  I called [facility] and cancelled the suppository Rx and new Rx sent for cream.

## 2019-03-13 ENCOUNTER — Encounter: Payer: Self-pay | Admitting: Obstetrics & Gynecology

## 2019-03-13 NOTE — Patient Instructions (Signed)
1. Pessary maintenance Well with Milex ring #5 with support.  Pessary maintenance done today.  Patient will continue the same.  Follow-up in 4 months.  2. Vaginal odor Bacterial vaginosis confirmed with wet prep.  Will treat with clindamycin vaginal cream x3 nights.  Prescription sent to pharmacy.  WET PREP FOR [place], YEAST, CLUE  Other orders - clindamycin (CLEOCIN) 100 MG vaginal cream vaginally at bedtime for 3 days.  Judyann, it was a pleasure seeing you today!

## 2019-03-30 ENCOUNTER — Other Ambulatory Visit: Payer: Self-pay | Admitting: Internal Medicine

## 2019-03-30 NOTE — Telephone Encounter (Signed)
[place] Controlled Database Checked Last filled: 02/03/19 # 30 LOV w/you: 11/21/18 Next appt w/you: None

## 2019-04-08 ENCOUNTER — Other Ambulatory Visit: Payer: Self-pay | Admitting: Physician Assistant

## 2019-04-09 ENCOUNTER — Other Ambulatory Visit: Payer: Self-pay

## 2019-04-09 MED ORDER — LISINOPRIL 40 MG PO TABS: 40.0000 mg | ORAL_TABLET | Freq: Every day | ORAL | 3 refills | Status: DC

## 2019-04-09 NOTE — Telephone Encounter (Signed)
Refill sent for Lisinopril 40 mg  

## 2019-06-12 ENCOUNTER — Telehealth: Payer: Self-pay

## 2019-06-12 NOTE — Telephone Encounter (Signed)
Copied from [place] [id number]. Topic: General - Call Back - No Documentation >> Jun 12, 2019 11:12 AM Erick Blinks wrote: Reason for CRM: Pt wants to a call back from nurse explaining whether she should take the vaccine Best contact: [phone]

## 2019-06-12 NOTE — Telephone Encounter (Signed)
Yes, the medication is safe and she should get it.  I know she has multiple allergies to different medications, but that is not a concern when it comes to getting this vaccine.

## 2019-06-12 NOTE — Telephone Encounter (Signed)
Patient advised of dr burns instructions, she already has phone number to call for appt

## 2019-06-30 NOTE — Assessment & Plan Note (Addendum)
Chronic Taking iron pills daily Check CBC, iron panel

## 2019-06-30 NOTE — Assessment & Plan Note (Addendum)
Chronic Ativan 0.25 mg as needed for anxiety No side effects, works well Will continue

## 2019-06-30 NOTE — Progress Notes (Signed)
Subjective:    Patient ID: [name], female    DOB: [birth year], 86 y.o.   MRN: [name]  HPI The patient is here for follow up of their chronic medical problems, including A. fib, hypertension, hypothyroidism, GERD, iron deficiency anemia, anxiety, hyperglycemia, stage III CKD.  She is taking all of her medications daily as prescribed.  She is not exercising regularly.  She is reading and doing puzzles.  She is tired of staying at home.   BP 127/? At home -- usually it is in the 150s   She is concerned she has too much wax in her ears - her hearing is decreased - she is having to increase the volume on her TV. She denies ear pain.  She thinks she needs her ears cleaned out.   Medications and allergies reviewed with patient and updated if appropriate.  Patient Active Problem List   Diagnosis Date Noted  . Female bladder prolapse 11/15/2017  . Decreased hearing of left ear 11/15/2017  . Iron deficiency anemia 09/10/2017  . Shortness of breath 09/10/2017  . Hyperglycemia 05/10/2017  . Persistent atrial fibrillation ([place]) 08/20/2016  . GERD (gastroesophageal reflux disease) 02/20/2016  . Left hip pain 01/20/2016  . Peripheral edema 01/20/2016  . Systolic murmur [id number]  . Depression 07/27/2013  . Anxiety 07/18/2013  . Nodule of left lung 07/18/2013  . Apical variant hypertrophic cardiomyopathy ([place]) 08/23/2009  . Unspecified vitamin D deficiency 01/21/2009  . Chronic kidney disease (CKD), stage III (moderate) 01/21/2009  . MYCOSIS FUNGOIDES 05/21/2008  . DIVERTICULOSIS, COLON 05/21/2008  . OSTEOARTHRITIS 06/02/2007  . Hypothyroidism 04/28/2007  . HYPERLIPIDEMIA 04/28/2007  . Essential hypertension 04/28/2007  . Osteoporosis 04/28/2007  . FIBROMYALGIA 11/14/2006    Current Outpatient Medications on File Prior to Visit  Medication Sig Dispense Refill  . beta carotene w/minerals (OCUVITE) tablet Take 1 tablet by mouth daily.    . carvedilol (COREG) 3.125 MG  tablet TAKE ONE TABLET BY MOUTH TWICE DAILY 180 tablet 1  . clindamycin (CLEOCIN) 2 % vaginal cream Place 1 Applicatorful vaginally at bedtime. 40 g 0  . ferrous sulfate 325 (65 FE) MG EC tablet Take 325 mg by mouth daily.    . furosemide (LASIX) 20 MG tablet Take 20 mg by mouth as needed.    Marland Kitchen levothyroxine (SYNTHROID) 75 MCG tablet TAKE 1 TABLET BY MOUTH DAILY EXCEPT TAKE 1/2 TABLET ON TUESDAYS AND SATURDAY 72 tablet 1  . lisinopril (ZESTRIL) 40 MG tablet Take 1 tablet (40 mg total) by mouth daily. 90 tablet 3  . LORazepam (ATIVAN) 0.5 MG tablet TAKE 1/2 TABLET BY MOUTH DAILY AS NEEDED FOR ANXIETY 30 tablet 1  . omeprazole (PRILOSEC) 20 MG capsule TAKE ONE CAPSULE BY MOUTH DAILY 90 capsule 3   No current facility-administered medications on file prior to visit.    Past Medical History:  Diagnosis Date  . Adrenal myelolipoma   . Apical variant hypertrophic cardiomyopathy ([place])    Echo 4/18: Normal LV size with marked apical hypertrophy. LVEF 60-65%. No WMA. Mild MR. Moderate left atrial enlargement. Normal RV size and function. Mild right atrial enlargemen  . Chronic kidney disease   . DIVERTICULOSIS, COLON   . Dysmetabolic syndrome X   . FIBROMYALGIA   . Gastric ulcer due to Helicobacter pylori   . Hiatal hernia   . Hx of cataract surgery 01/08/2016  . HYPERLIPIDEMIA   . Hyperplastic colon polyp   . HYPERTENSION, ESSENTIAL NOS   . Hyponatremia   .  Hypothyroidism   . MYCOSIS FUNGOIDES   . Nodule of left lung   . Normocytic anemia   . Olecranon fracture 01/07/2016  . OSTEOARTHRITIS   . OSTEOPOROSIS   . Persistent atrial fibrillation ([place])    s/p DCCV 2018 // Apixaban for anticoag  . Renal angiomyolipoma   . Unspecified vitamin D deficiency     Past Surgical History:  Procedure Laterality Date  . CARDIOVERSION N/A 01/03/2017   Procedure: CARDIOVERSION;  Surgeon: Josue Hector, MD;  Location: [facility]COPY;  Service: Cardiovascular;  Laterality: N/A;  . colonoscopy with  polypectomy  07/25/2007   Dr Olevia Perches, Recheck in 7 years for 2009  . G 4 P 2    . I & D EXTREMITY Right 01/08/2016   Procedure: IRRIGATION AND DEBRIDEMENT OF OLECRANON FRACTURE;  Surgeon: Renette Butters, MD;  Location: [place];  Service: Orthopedics;  Laterality: Right;  . ORIF ELBOW FRACTURE Right 01/08/2016   Procedure: OPEN REDUCTION INTERNAL FIXATION (ORIF) ELBOW/OLECRANON FRACTURE, NEUROLYSIS;  Surgeon: Renette Butters, MD;  Location: [place];  Service: Orthopedics;  Laterality: Right;    Social History   Socioeconomic History  . Marital status: Widowed    Spouse name: Not on file  . Number of children: 2  . Years of education: Not on file  . Highest education level: Not on file  Occupational History  . Occupation: Retired    Fish farm manager: RETIRED  Tobacco Use  . Smoking status: Former Smoker    Packs/day: 0.50    Years: 14.00    Pack years: 7.00    Quit date: 1956    Years since quitting: 65.1  . Smokeless tobacco: Never Used  . Tobacco comment: smoked 1951-1965 , up to 1/2 ppd  Substance and Sexual Activity  . Alcohol use: No    Alcohol/week: 0.0 standard drinks  . Drug use: No  . Sexual activity: Never  Other Topics Concern  . Not on file  Social History Narrative   Widowed.  Lives alone.  Ambulates without assistance.  Steady on feet.   Social Determinants of Health   Financial Resource Strain:   . Difficulty of Paying Living Expenses: Not on file  Food Insecurity:   . Worried About Charity fundraiser in the Last Year: Not on file  . Ran Out of Food in the Last Year: Not on file  Transportation Needs:   . Lack of Transportation (Medical): Not on file  . Lack of Transportation (Non-Medical): Not on file  Physical Activity:   . Days of Exercise per Week: Not on file  . Minutes of Exercise per Session: Not on file  Stress:   . Feeling of Stress : Not on file  Social Connections:   . Frequency of Communication with Friends and Family: Not on file  . Frequency of  Social Gatherings with Friends and Family: Not on file  . Attends Religious Services: Not on file  . Active Member of Clubs or Organizations: Not on file  . Attends Archivist Meetings: Not on file  . Marital Status: Not on file    Family History  Problem Relation Age of Onset  . Diabetes Mother   . Heart attack Maternal Uncle 67       his 2 sons had MI in 45s  . Hypertension Maternal Grandmother   . Stroke Maternal Grandmother 69  . Cancer Neg Hx   . Asthma Neg Hx   . COPD Neg Hx  Review of Systems  Constitutional: Negative for chills and fever.       Low energy - no change  HENT: Positive for hearing loss (worse - ? wax). Negative for ear pain.   Respiratory: Positive for shortness of breath (occ with exertion). Negative for cough and wheezing.   Cardiovascular: Negative for chest pain, palpitations and leg swelling.  Musculoskeletal: Positive for arthralgias.  Neurological: Negative for light-headedness and headaches.       Objective:   Vitals:   07/01/19 1501  BP: (!) 162/72  Pulse: (!) 52  Resp: 16  Temp: 97.8 F (36.6 C)  SpO2: 99%   BP Readings from Last 3 Encounters:  07/01/19 (!) 162/72  03/10/19 134/84  12/01/18 (!) 152/64   Wt Readings from Last 3 Encounters:  07/01/19 154 lb 9.6 oz (70.1 kg)  12/01/18 156 lb (70.8 kg)  11/21/18 154 lb (69.9 kg)   Body mass index is 32.31 kg/m.   Physical Exam    Constitutional: Appears well-developed and well-nourished. No distress.  HENT:  Head: Normocephalic and atraumatic.  Neck: Neck supple. No tracheal deviation present. No thyromegaly present.  No cervical lymphadenopathy Ears, pre-procedure:  B/l ear canals with excessive cerumen, TMs not visualized Cardiovascular: Normal rate, regular rhythm and normal heart sounds.  2/6 sys murmur heard. No carotid bruit .  Mild pitting b/l LE edema Pulmonary/Chest: Effort normal and breath sounds normal. No respiratory distress. No has no wheezes. No  rales.  Skin: Skin is warm and dry. Not diaphoretic.  Psychiatric: Normal mood and affect. Behavior is normal.      Assessment & Plan:    See Problem List for Assessment and Plan of chronic medical problems.    This visit occurred during the SARS-CoV-2 public health emergency.  Safety protocols were in place, including screening questions prior to the visit, additional usage of staff PPE, and extensive cleaning of exam room while observing appropriate contact time as indicated for disinfecting solutions.

## 2019-06-30 NOTE — Assessment & Plan Note (Addendum)
Chronic Sugars minimally elevated, A1c 5.7% Check A1c today

## 2019-06-30 NOTE — Patient Instructions (Addendum)
  Blood work was ordered.     Medications reviewed and updated.  Changes include :   none  Your prescription(s) have been submitted to your pharmacy. Please take as directed and contact our office if you believe you are having problem(s) with the medication(s).  Your ears were cleaned today.   Please followup in 6 months

## 2019-07-01 ENCOUNTER — Other Ambulatory Visit: Payer: Self-pay

## 2019-07-01 ENCOUNTER — Other Ambulatory Visit: Payer: Self-pay | Admitting: Interventional Cardiology

## 2019-07-01 ENCOUNTER — Encounter: Payer: Self-pay | Admitting: Internal Medicine

## 2019-07-01 ENCOUNTER — Ambulatory Visit (INDEPENDENT_AMBULATORY_CARE_PROVIDER_SITE_OTHER): Payer: Medicare Other | Admitting: Internal Medicine

## 2019-07-01 VITALS — BP 162/72 | HR 52 | Temp 97.8°F | Resp 16 | Ht <= 58 in | Wt 154.6 lb

## 2019-07-01 DIAGNOSIS — K219 Gastro-esophageal reflux disease without esophagitis: Secondary | ICD-10-CM

## 2019-07-01 DIAGNOSIS — D509 Iron deficiency anemia, unspecified: Secondary | ICD-10-CM | POA: Diagnosis not present

## 2019-07-01 DIAGNOSIS — E782 Mixed hyperlipidemia: Secondary | ICD-10-CM | POA: Diagnosis not present

## 2019-07-01 DIAGNOSIS — F419 Anxiety disorder, unspecified: Secondary | ICD-10-CM

## 2019-07-01 DIAGNOSIS — R739 Hyperglycemia, unspecified: Secondary | ICD-10-CM

## 2019-07-01 DIAGNOSIS — E039 Hypothyroidism, unspecified: Secondary | ICD-10-CM | POA: Diagnosis not present

## 2019-07-01 DIAGNOSIS — I10 Essential (primary) hypertension: Secondary | ICD-10-CM | POA: Diagnosis not present

## 2019-07-01 DIAGNOSIS — N1831 Chronic kidney disease, stage 3a: Secondary | ICD-10-CM

## 2019-07-01 DIAGNOSIS — I4819 Other persistent atrial fibrillation: Secondary | ICD-10-CM | POA: Diagnosis not present

## 2019-07-01 LAB — CBC WITH DIFFERENTIAL/PLATELET
Basophils Absolute: 0 10*3/uL (ref 0.0–0.1)
Basophils Relative: 0.6 % (ref 0.0–3.0)
Eosinophils Absolute: 0.1 10*3/uL (ref 0.0–0.7)
Eosinophils Relative: 1.1 % (ref 0.0–5.0)
HCT: 30.5 % — ABNORMAL LOW (ref 36.0–46.0)
Hemoglobin: 10.2 g/dL — ABNORMAL LOW (ref 12.0–15.0)
Lymphocytes Relative: 29 % (ref 12.0–46.0)
Lymphs Abs: 1.8 10*3/uL (ref 0.7–4.0)
MCHC: 33.3 g/dL (ref 30.0–36.0)
MCV: 85.6 fl (ref 78.0–100.0)
Monocytes Absolute: 0.6 10*3/uL (ref 0.1–1.0)
Monocytes Relative: 9.9 % (ref 3.0–12.0)
Neutro Abs: 3.6 10*3/uL (ref 1.4–7.7)
Neutrophils Relative %: 59.4 % (ref 43.0–77.0)
Platelets: 273 10*3/uL (ref 150.0–400.0)
RBC: 3.56 Mil/uL — ABNORMAL LOW (ref 3.87–5.11)
RDW: 13.7 % (ref 11.5–15.5)
WBC: 6.1 10*3/uL (ref 4.0–10.5)

## 2019-07-01 LAB — TSH: TSH: 1.15 u[IU]/mL (ref 0.35–4.50)

## 2019-07-01 LAB — LIPID PANEL
Cholesterol: 221 mg/dL — ABNORMAL HIGH (ref 0–200)
HDL: 55.2 mg/dL (ref >39.00)
LDL Cholesterol: 131 mg/dL — ABNORMAL HIGH (ref 0–99)
NonHDL: 165.86
Total CHOL/HDL Ratio: 4
Triglycerides: 173 mg/dL — ABNORMAL HIGH (ref 0.0–149.0)
VLDL: 34.6 mg/dL (ref 0.0–40.0)

## 2019-07-01 LAB — COMPREHENSIVE METABOLIC PANEL
ALT: 9 U/L (ref 0–35)
AST: 15 U/L (ref 0–37)
Albumin: 4.3 g/dL (ref 3.5–5.2)
Alkaline Phosphatase: 71 U/L (ref 39–117)
BUN: 18 mg/dL (ref 6–23)
CO2: 24 mEq/L (ref 19–32)
Calcium: 9.3 mg/dL (ref 8.4–10.5)
Chloride: 100 mEq/L (ref 96–112)
Creatinine, Ser: 1 mg/dL (ref 0.40–1.20)
GFR: 52.16 mL/min — ABNORMAL LOW (ref >60.00)
Glucose, Bld: 94 mg/dL (ref 70–99)
Potassium: 4.1 mEq/L (ref 3.5–5.1)
Sodium: 131 mEq/L — ABNORMAL LOW (ref 135–145)
Total Bilirubin: 0.5 mg/dL (ref 0.2–1.2)
Total Protein: 6.9 g/dL (ref 6.0–8.3)

## 2019-07-01 LAB — HEMOGLOBIN A1C: Hgb A1c MFr Bld: 5.7 % (ref 4.6–6.5)

## 2019-07-01 MED ORDER — LORAZEPAM 0.5 MG PO TABS: ORAL_TABLET | ORAL | 1 refills | Status: DC

## 2019-07-01 NOTE — Telephone Encounter (Signed)
Pt last saw Dr Irish Lack 12/01/18, last labs 11/21/18 Creat 0.96, age 86, weight 70.8kg, based on specified criteria pt is on appropriate dosage of Eliquis 5mg  BID.  Will refill rx.

## 2019-07-01 NOTE — Assessment & Plan Note (Signed)
Chronic GERD controlled Continue daily medication  

## 2019-07-01 NOTE — Progress Notes (Signed)
Patient consent obtained. Irrigation with water and peroxide performed. Full view of tympanic membranes after procedure.  Patient tolerated procedure well. Bilateral ears

## 2019-07-02 LAB — IRON,TIBC AND FERRITIN PANEL
%SAT: 12 % (calc) — ABNORMAL LOW (ref 16–45)
Ferritin: 10 ng/mL — ABNORMAL LOW (ref 16–288)
Iron: 47 ug/dL (ref 45–160)
TIBC: 399 mcg/dL (calc) (ref 250–450)

## 2019-07-02 NOTE — Assessment & Plan Note (Signed)
Chronic  Clinically euthyroid Check tsh  Titrate med dose if needed  

## 2019-07-02 NOTE — Assessment & Plan Note (Signed)
Chronic BP slightly elevated, but given age ok  Current regimen well tolerated Continue current medications at current doses cmp

## 2019-07-02 NOTE — Assessment & Plan Note (Signed)
Check lipids Not on medication

## 2019-07-02 NOTE — Assessment & Plan Note (Signed)
Chronic Following with cardiology Asymptomatic On eliquis, coreg cmp,cbc

## 2019-07-05 ENCOUNTER — Other Ambulatory Visit: Payer: Self-pay | Admitting: Internal Medicine

## 2019-07-05 DIAGNOSIS — D509 Iron deficiency anemia, unspecified: Secondary | ICD-10-CM

## 2019-07-21 ENCOUNTER — Other Ambulatory Visit: Payer: Self-pay

## 2019-07-22 ENCOUNTER — Ambulatory Visit (INDEPENDENT_AMBULATORY_CARE_PROVIDER_SITE_OTHER): Payer: Medicare Other | Admitting: Obstetrics & Gynecology

## 2019-07-22 ENCOUNTER — Encounter: Payer: Self-pay | Admitting: Obstetrics & Gynecology

## 2019-07-22 VITALS — BP 154/72

## 2019-07-22 DIAGNOSIS — Z4689 Encounter for fitting and adjustment of other specified devices: Secondary | ICD-10-CM | POA: Diagnosis not present

## 2019-07-22 NOTE — Patient Instructions (Signed)
1. Pessary maintenance Well with Milex #5 ring with support.  Good fit with no complication.  Pessary removed cleaned and put back in place without difficulty.  Precautions discussed.  Suggested Depends for mild urine leakage.  Follow-up in 4 months for pessary maintenance.  [name], it was a pleasure seeing you today!

## 2019-07-22 NOTE — Progress Notes (Signed)
    JAI OVERFIELD 1928-01-10 [name]        86 y.o.  [name]   RP: Pessary maintenance at 4 months  HPI: Well with Milex ring #5 with support. No vaginal discharge.  No pelvic/vaginal pain.  No vaginal bleeding.  Minimal leakage of urine, using a pad. No Sx of UTI.  No fever.   OB History  Gravida Para Term Preterm AB Living  4 2     2 2   SAB TAB Ectopic Multiple Live Births  2            # Outcome Date GA Lbr Len/2nd Weight Sex Delivery Anes PTL Lv  4 SAB           3 SAB           2 Para           1 Para             Past medical history,surgical history, problem list, medications, allergies, family history and social history were all reviewed and documented in the EPIC chart.   Directed ROS with pertinent positives and negatives documented in the history of present illness/assessment and plan.  Exam:  Vitals:   07/22/19 1425  BP: (!) 154/72   General appearance:  Normal  Abdomen: Normal  Gynecologic exam: Vulva normal.  Vaginal mucosa intact.  Normal secretions.  Pessary removed easily, cleaned and put back in place.   Assessment/Plan:  86 y.o. [name]   1. Pessary maintenance Well with Milex #5 ring with support.  Good fit with no complication.  Pessary removed cleaned and put back in place without difficulty.  Precautions discussed.  Suggested Depends for mild urine leakage.  Follow-up in 4 months for pessary maintenance.  Princess Bruins MD, 2:47 PM 07/22/2019

## 2019-07-30 ENCOUNTER — Other Ambulatory Visit: Payer: Self-pay | Admitting: Internal Medicine

## 2019-09-03 ENCOUNTER — Other Ambulatory Visit: Payer: Self-pay

## 2019-09-03 ENCOUNTER — Other Ambulatory Visit (INDEPENDENT_AMBULATORY_CARE_PROVIDER_SITE_OTHER): Payer: Medicare Other

## 2019-09-03 DIAGNOSIS — D509 Iron deficiency anemia, unspecified: Secondary | ICD-10-CM

## 2019-09-03 LAB — CBC WITH DIFFERENTIAL/PLATELET
Basophils Absolute: 0 10*3/uL (ref 0.0–0.1)
Basophils Relative: 0.7 % (ref 0.0–3.0)
Eosinophils Absolute: 0.1 10*3/uL (ref 0.0–0.7)
Eosinophils Relative: 1.4 % (ref 0.0–5.0)
HCT: 31.5 % — ABNORMAL LOW (ref 36.0–46.0)
Hemoglobin: 10.6 g/dL — ABNORMAL LOW (ref 12.0–15.0)
Lymphocytes Relative: 25.8 % (ref 12.0–46.0)
Lymphs Abs: 1.6 10*3/uL (ref 0.7–4.0)
MCHC: 33.7 g/dL (ref 30.0–36.0)
MCV: 89.3 fl (ref 78.0–100.0)
Monocytes Absolute: 0.6 10*3/uL (ref 0.1–1.0)
Monocytes Relative: 9.2 % (ref 3.0–12.0)
Neutro Abs: 3.8 10*3/uL (ref 1.4–7.7)
Neutrophils Relative %: 62.9 % (ref 43.0–77.0)
Platelets: 245 10*3/uL (ref 150.0–400.0)
RBC: 3.52 Mil/uL — ABNORMAL LOW (ref 3.87–5.11)
RDW: 16.1 % — ABNORMAL HIGH (ref 11.5–15.5)
WBC: 6 10*3/uL (ref 4.0–10.5)

## 2019-09-04 LAB — IRON,TIBC AND FERRITIN PANEL
%SAT: 14 % (calc) — ABNORMAL LOW (ref 16–45)
Ferritin: 20 ng/mL (ref 16–288)
Iron: 46 ug/dL (ref 45–160)
TIBC: 337 mcg/dL (calc) (ref 250–450)

## 2019-09-05 ENCOUNTER — Encounter: Payer: Self-pay | Admitting: Internal Medicine

## 2019-09-15 ENCOUNTER — Telehealth: Payer: Self-pay | Admitting: Interventional Cardiology

## 2019-09-15 NOTE — Telephone Encounter (Signed)
Patient made aware that her daughter can come up with her.

## 2019-09-15 NOTE — Telephone Encounter (Signed)
Patient states that her daughter will need to come with her to her appt on 09/24/19 with Dr. Irish Lack. She states that her daughter was able to come last time and she is her transportation.

## 2019-09-23 NOTE — Progress Notes (Signed)
Cardiology Office Note   Date:  09/24/2019   ID:  Amber, [name] April 13, 1928, MRN [name]  PCP:  Binnie Rail, MD    No chief complaint on file.  AFib  Wt Readings from Last 3 Encounters:  09/24/19 152 lb 12.8 oz (69.3 kg)  07/01/19 154 lb 9.6 oz (70.1 kg)  12/01/18 156 lb (70.8 kg)       History of Present Illness: [name] is a 86 y.o. female who previously saw Dr. Saunders Revel.  She has persistent atrial fibrillation, apical variant hypertrophic cardiomyopathy, hypertension, hyperlipidemia, chronic kidney disease, anemia. She underwent cardioversion in 2018.Herbeta-blockerdose has been reduced due to symptoms of fatigue anddyspnea on exertion. She was feeling better at her last visit with me in 02/2018. We decided to try to switch her from a beta-blockerto acalcium channel blocker. However, she could not tolerate this and was placed back on her beta-blocker. CHADS2-VASc=4  She has had some DOE.  She also has some anxiety when she wakes up, worse with COVID.  She got her vaccine shots.   Denies : Chest pain. Dizziness. Leg edema. Nitroglycerin use. Orthopnea. Palpitations. Paroxysmal nocturnal dyspnea. Shortness of breath. Syncope.   She has not taken any as needed Lasix.  She states she feels sick when she takes the Lasix.  She also reports not having energy.  She has not been exercising much.  Feels like the last year of staying in size has cut down on her activity and her stamina in general.    Past Medical History:  Diagnosis Date  . Adrenal myelolipoma   . Apical variant hypertrophic cardiomyopathy ([place])    Echo 4/18: Normal LV size with marked apical hypertrophy. LVEF 60-65%. No WMA. Mild MR. Moderate left atrial enlargement. Normal RV size and function. Mild right atrial enlargemen  . Chronic kidney disease   . DIVERTICULOSIS, COLON   . Dysmetabolic syndrome X   . FIBROMYALGIA   . Gastric ulcer due to Helicobacter pylori   . Hiatal hernia    . Hx of cataract surgery 01/08/2016  . HYPERLIPIDEMIA   . Hyperplastic colon polyp   . HYPERTENSION, ESSENTIAL NOS   . Hyponatremia   . Hypothyroidism   . MYCOSIS FUNGOIDES   . Nodule of left lung   . Normocytic anemia   . Olecranon fracture 01/07/2016  . OSTEOARTHRITIS   . OSTEOPOROSIS   . Persistent atrial fibrillation ([place])    s/p DCCV 2018 // Apixaban for anticoag  . Renal angiomyolipoma   . Unspecified vitamin D deficiency     Past Surgical History:  Procedure Laterality Date  . CARDIOVERSION N/A 01/03/2017   Procedure: CARDIOVERSION;  Surgeon: Josue Hector, MD;  Location: [facility] ENDOSCOPY;  Service: Cardiovascular;  Laterality: N/A;  . colonoscopy with polypectomy  07/25/2007   Dr Olevia Perches, Recheck in 7 years for 2009  . G 4 P 2    . I & D EXTREMITY Right 01/08/2016   Procedure: IRRIGATION AND DEBRIDEMENT OF OLECRANON FRACTURE;  Surgeon: Renette Butters, MD;  Location: [place];  Service: Orthopedics;  Laterality: Right;  . ORIF ELBOW FRACTURE Right 01/08/2016   Procedure: OPEN REDUCTION INTERNAL FIXATION (ORIF) ELBOW/OLECRANON FRACTURE, NEUROLYSIS;  Surgeon: Renette Butters, MD;  Location: [place];  Service: Orthopedics;  Laterality: Right;     Current Outpatient Medications  Medication Sig Dispense Refill  . beta carotene w/minerals (OCUVITE) tablet Take 1 tablet by mouth daily.    . carvedilol (COREG) 3.125 MG tablet  TAKE ONE TABLET BY MOUTH TWICE DAILY 180 tablet 1  . clindamycin (CLEOCIN) 2 % vaginal cream Place 1 Applicatorful vaginally at bedtime. 40 g 0  . ELIQUIS 5 MG TABS tablet TAKE 1 TABLET BY MOUTH TWICE A DAY 60 tablet 5  . ferrous sulfate 325 (65 FE) MG EC tablet Take 325 mg by mouth daily.    . furosemide (LASIX) 20 MG tablet Take 20 mg by mouth as needed.    Marland Kitchen levothyroxine (SYNTHROID) 75 MCG tablet TAKE 1 TABLET BY MOUTH DAILY EXCEPT TAKE 1/2 TABLET ON TUESDAYS AND SATURDAY 72 tablet 1  . lisinopril (ZESTRIL) 40 MG tablet Take 1 tablet (40 mg total) by mouth daily.  90 tablet 3  . LORazepam (ATIVAN) 0.5 MG tablet TAKE 1/2 TABLET BY MOUTH DAILY AS NEEDED FOR ANXIETY 30 tablet 1  . omeprazole (PRILOSEC) 20 MG capsule TAKE 1 CAPSULE BY MOUTH EVERY DAY 90 capsule 3   No current facility-administered medications for this visit.    Allergies:   Achromycin [tetracycline hcl], Celecoxib, Clarithromycin, Ezetimibe-simvastatin, Flagyl [metronidazole], Penicillins, Simvastatin, Clonazepam, Erythromycin, Sertraline, and Abilify [aripiprazole]    Social History:  The patient  reports that she quit smoking about 65 years ago. She has a 7.00 pack-year smoking history. She has never used smokeless tobacco. She reports that she does not drink alcohol or use drugs.   Family History:  The patient's family history includes Diabetes in her mother; Heart attack (age of onset: 23) in her maternal uncle; Hypertension in her maternal grandmother; Stroke (age of onset: 82) in her maternal grandmother.    ROS:  Please see the history of present illness.   Otherwise, review of systems are positive for fatigue.   All other systems are reviewed and negative.    PHYSICAL EXAM: VS:  BP (!) 156/70   Pulse 63   Ht 4\' 10"  (1.473 m)   Wt 152 lb 12.8 oz (69.3 kg)   LMP  (LMP Unknown)   SpO2 98%   BMI 31.94 kg/m  , BMI Body mass index is 31.94 kg/m. GEN: Well nourished, well developed, in no acute distress  HEENT: normal  Neck: no JVD, carotid bruits, or masses Cardiac: RRR; no murmurs, rubs, or gallops,no edema  Respiratory:  clear to auscultation bilaterally, normal work of breathing GI: soft, nontender, nondistended, + BS MS: no deformity or atrophy  Skin: warm and dry, no rash Neuro:  Strength and sensation are intact Psych: euthymic mood, full affect   EKG:   The ekg ordered today demonstrates NSR , no ST changes   Recent Labs: 07/01/2019: ALT 9; BUN 18; Creatinine, Ser 1.00; Potassium 4.1; Sodium 131; TSH 1.15 09/03/2019: Hemoglobin 10.6; Platelets 245.0   Lipid  Panel    Component Value Date/Time   CHOL 221 (H) 07/01/2019 1602   TRIG 173.0 (H) 07/01/2019 1602   HDL 55.20 07/01/2019 1602   CHOLHDL 4 07/01/2019 1602   VLDL 34.6 07/01/2019 1602   LDLCALC 131 (H) 07/01/2019 1602   LDLDIRECT 128.9 09/09/2012 1129     Other studies Reviewed: Additional studies/ records that were reviewed today with results demonstrating: Cr 1.0.   ASSESSMENT AND PLAN:  1. AFib: In NSR.  Eliquis for stroke prevention. 2. SHOB: BMet and BNP today.  May be due to deconditioning.  We will try to exclude volume overload.  He needs to try to increase exercise while avoiding falls. 3. Apical variant hypertrophic cardiomyopathy: No syncope. 4. HTN: Borderline blood pressure today.  Would not  increase carvedilol since she has had issues with bradycardia in the past.  Heart rate 63 today.  Consider amlodipine if her blood pressure is high at home. 5. Avoid falling:   Current medicines are reviewed at length with the patient today.  The patient concerns regarding her medicines were addressed.  The following changes have been made:  No change  Labs/ tests ordered today include:  No orders of the defined types were placed in this encounter.   Recommend 150 minutes/week of aerobic exercise Low fat, low carb, high fiber diet recommended  Disposition:   FU in 1 year   Signed, Larae Grooms, MD  09/24/2019 3:47 PM    [facility], [place], [place]  [address] Phone: [phone]; Fax: [phone]

## 2019-09-24 ENCOUNTER — Encounter: Payer: Self-pay | Admitting: Interventional Cardiology

## 2019-09-24 ENCOUNTER — Ambulatory Visit (INDEPENDENT_AMBULATORY_CARE_PROVIDER_SITE_OTHER): Payer: Medicare Other | Admitting: Interventional Cardiology

## 2019-09-24 ENCOUNTER — Other Ambulatory Visit: Payer: Self-pay

## 2019-09-24 VITALS — BP 156/70 | HR 63 | Ht <= 58 in | Wt 152.8 lb

## 2019-09-24 DIAGNOSIS — I10 Essential (primary) hypertension: Secondary | ICD-10-CM

## 2019-09-24 DIAGNOSIS — I4819 Other persistent atrial fibrillation: Secondary | ICD-10-CM | POA: Diagnosis not present

## 2019-09-24 DIAGNOSIS — I422 Other hypertrophic cardiomyopathy: Secondary | ICD-10-CM | POA: Diagnosis not present

## 2019-09-24 DIAGNOSIS — E785 Hyperlipidemia, unspecified: Secondary | ICD-10-CM | POA: Diagnosis not present

## 2019-09-24 NOTE — Patient Instructions (Signed)
Medication Instructions:  Your physician recommends that you continue on your current medications as directed. Please refer to the Current Medication list given to you today.  *If you need a refill on your cardiac medications before your next appointment, please call your pharmacy*   Lab Work: TODAY: BMET, BNP  If you have labs (blood work) drawn today and your tests are completely normal, you will receive your results only by: Marland Kitchen MyChart Message (if you have MyChart) OR . A paper copy in the mail If you have any lab test that is abnormal or we need to change your treatment, we will call you to review the results.   Testing/Procedures: None ordered   Follow-Up: At [facility], you and your health needs are our priority.  As part of our continuing mission to provide you with exceptional heart care, we have created designated Provider Care Teams.  These Care Teams include your primary Cardiologist (physician) and Advanced Practice Providers (APPs -  Physician Assistants and Nurse Practitioners) who all work together to provide you with the care you need, when you need it.  We recommend signing up for the patient portal called "MyChart".  Sign up information is provided on this After Visit Summary.  MyChart is used to connect with patients for Virtual Visits (Telemedicine).  Patients are able to view lab/test results, encounter notes, upcoming appointments, etc.  Non-urgent messages can be sent to your provider as well.   To learn more about what you can do with MyChart, go to [email or web address].    Your next appointment:   12 month(s)  The format for your next appointment:   In Person  Provider:   You may see Larae Grooms, MD or one of the following Advanced Practice Providers on your designated Care Team:    Melina Copa, PA-C  Ermalinda Barrios, PA-C    Other Instructions

## 2019-09-25 ENCOUNTER — Telehealth: Payer: Self-pay | Admitting: *Deleted

## 2019-09-25 LAB — BASIC METABOLIC PANEL
BUN/Creatinine Ratio: 16 (ref 12–28)
BUN: 16 mg/dL (ref 8–27)
CO2: 20 mmol/L (ref 20–29)
Calcium: 9.4 mg/dL (ref 8.7–10.3)
Chloride: 98 mmol/L (ref 96–106)
Creatinine, Ser: 1 mg/dL (ref 0.57–1.00)
GFR calc Af Amer: 58 mL/min/{1.73_m2} — ABNORMAL LOW (ref >59)
GFR calc non Af Amer: 50 mL/min/{1.73_m2} — ABNORMAL LOW (ref >59)
Glucose: 93 mg/dL (ref 65–99)
Potassium: 4.6 mmol/L (ref 3.5–5.2)
Sodium: 133 mmol/L — ABNORMAL LOW (ref 134–144)

## 2019-09-25 LAB — PRO B NATRIURETIC PEPTIDE: NT-Pro BNP: 2442 pg/mL — ABNORMAL HIGH (ref 0–738)

## 2019-09-25 MED ORDER — POTASSIUM CHLORIDE CRYS ER 20 MEQ PO TBCR: EXTENDED_RELEASE_TABLET | ORAL | 1 refills | Status: DC

## 2019-09-25 MED ORDER — TORSEMIDE 20 MG PO TABS: ORAL_TABLET | ORAL | 1 refills | Status: DC

## 2019-09-25 NOTE — Telephone Encounter (Signed)
-----   Message from Jettie Booze, MD sent at 09/25/2019 12:50 PM EDT ----- Evidence of increased fluid in system.  Low salt diet.  She did not want to take Lasix yesterday due to perceived side effects.  Can try torsemide 20 mg daily prn, Disp 30 tabs.  I would have her take it for the next 3 mornings to see if [facility] improves.  WOuld have her take KCl 20 mEq daily when she takes the torsemide.

## 2019-09-25 NOTE — Telephone Encounter (Signed)
I spoke with patient and reviewed results/recommendations with her. Will send prescriptions to [facility] in [employer] on [address]. Patient aware not to take as needed lasix. Patient has celecoxib listed under allergies/contraindications. Causes diarrhea. Torsemide is listed as cross sensitive with celecoxib.  Patient will let us know if she develops diarrhea after taking torsemide.

## 2019-09-29 ENCOUNTER — Telehealth: Payer: Self-pay | Admitting: Interventional Cardiology

## 2019-09-29 MED ORDER — FUROSEMIDE 20 MG PO TABS
20.0000 mg | ORAL_TABLET | Freq: Every day | ORAL | 11 refills | Status: DC
Start: 2019-09-29 — End: 2019-10-06

## 2019-09-29 NOTE — Telephone Encounter (Signed)
Pt c/o medication issue:  1. Name of Medication: torsemide (DEMADEX) 20 MG tablet and potassium chloride SA (KLOR-CON) 20 MEQ tablet   2. How are you currently taking this medication (dosage and times per day)? Not taking anymore  3. Are you having a reaction (difficulty breathing--STAT)? yes  4. What is your medication issue? Patient states that this medication made her sick. She stayed in bed half the day yesterday and felt nauseous.

## 2019-09-29 NOTE — Telephone Encounter (Signed)
Called patient who states she was severely nauseated after taking torsemide. She states she stayed in bed most of the day. She does not want to take another dose. She asks if she can try furosemide again - states she hasn't taken it in a year. I advised that she may d/c torsemide and start furosemide 20 mg once daily in addition to her Kdur 20 mEq. I asked her to take the furosemide for the next 3 days and call back to let us know how she is feeling. I also advised her to maintain a low sodium diet and she verbalized understanding. I advised her to call sooner if needed and she thanked me for the call.

## 2019-09-30 NOTE — Telephone Encounter (Signed)
Would start with 20 mg daily x 2 days, and if she tolerates, would increase to 40 mg daily x 2 days and see how she feels. Agree with 64mEq potassium dosage.   JV

## 2019-10-01 ENCOUNTER — Telehealth: Payer: Self-pay | Admitting: Interventional Cardiology

## 2019-10-01 NOTE — Telephone Encounter (Signed)
Called and spoke to patient. Made her aware that I am aware that she picked up and tried the torsemide (see prior telephone encounter).

## 2019-10-01 NOTE — Telephone Encounter (Signed)
Pt c/o medication issue:  1. Name of Medication: torsemide (DEMADEX) 20 MG tablet   2. How are you currently taking this medication (dosage and times per day)? Not currently taking medication   3. Are you having a reaction (difficulty breathing--STAT)? No   4. What is your medication issue? Anji is calling stating she was previously speaking with Dr. Hassell Done nurse discussing medications and had told her she did not think she had picked up torsemide from the pharmacy. After thinking about it Derionna realized she did pick it up from the pharmacy and discarded of it once she was told to stop taking the medication. Mandip states she just wanted to make sure the nurse she was speaking with was aware of this so she didn't think she was crazy. Please advise.

## 2019-10-01 NOTE — Telephone Encounter (Signed)
Called and spoke to patient and made her aware of recommendations below. Patient states that she never started torsemide. Made patient aware that she had a whole conversation with one of the triage nurses that the torsemide was causing her severe nausea. Patient states that she never started the torsemide. Patient seems confused. I asked her if she has someone helping with her medications. She states that she has her medicines lined up in a drawer and that she doesn't need help with it. She did say that her daughter comes by frequently to check on her. Patient denies having any nausea at this time. She states that she has had some SOB but denies swelling. Torsemide was originally started due to SOB and elevated BNP. I have instructed the patient to take furosemide 20 mg QD through the weekend along with 20 mEq of K-dur and call to report how she is feeling early next week. She verbalized understanding.  I will reach out to the patient's pharmacy and daughter.   Spoke to the patient's pharmacy. They said that she did pick up the torsemide and did not tolerate it so then picked up a new Rx for furosemide.  Called and spoke to daughter. She states that the patient did take the torsemide and did not feel well. She states that the patient is very anxious and reads the side effects and does not want to take certain medicines. She states that she will have her take the furosemide QD through the weekend with 20 mEq of K-dur and call to report how she is feeling over the weekend.   Per Dr. Irish Lack if she is feeling better after the weekend, can decrease to taking furosemide and K-dur PRN for SOB.

## 2019-10-06 ENCOUNTER — Telehealth: Payer: Self-pay | Admitting: Interventional Cardiology

## 2019-10-06 MED ORDER — FUROSEMIDE 20 MG PO TABS
20.0000 mg | ORAL_TABLET | Freq: Every day | ORAL | 11 refills | Status: DC | PRN
Start: 2019-10-06 — End: 2023-05-09

## 2019-10-06 NOTE — Telephone Encounter (Signed)
I spoke with patient and gave her information from Dr Varanasi.  

## 2019-10-06 NOTE — Telephone Encounter (Signed)
New Message    Pt is calling to speak with [name] about her fluid pill    Please call

## 2019-10-06 NOTE — Telephone Encounter (Signed)
I think it is ok to use the Lasix prn, but ideally at least 2x/week with potassium.  SHOB could be from anxiety or deconditioning as well.   JV

## 2019-10-06 NOTE — Telephone Encounter (Signed)
I spoke with patient. She is calling to give update after taking furosemide and potassium. She took over the weekend and Monday. She is going to the bathroom frequently. Weight is staying 150 lbs. Pulse oximeter shows heart rate was 74 earlier today and oxygen saturation was in mid-upper 90's (does not know exact number). She reports no change in shortness of breath since taking furosemide. No shortness of breath when in the house but notices it when walking outside. Is wondering if her nerves could be playing a role.  Per previous phone note if patient is feeling better after the weekend she can decrease furosemide and potassium to prn for shortness of breath. As there has been no change in patient's shortness of breath will send to Dr Irish Lack for review and recommendations.

## 2019-10-13 DIAGNOSIS — H353132 Nonexudative age-related macular degeneration, bilateral, intermediate dry stage: Secondary | ICD-10-CM | POA: Diagnosis not present

## 2019-10-13 DIAGNOSIS — Z961 Presence of intraocular lens: Secondary | ICD-10-CM | POA: Diagnosis not present

## 2019-10-13 DIAGNOSIS — H04123 Dry eye syndrome of bilateral lacrimal glands: Secondary | ICD-10-CM | POA: Diagnosis not present

## 2019-10-13 DIAGNOSIS — H26493 Other secondary cataract, bilateral: Secondary | ICD-10-CM | POA: Diagnosis not present

## 2019-10-21 ENCOUNTER — Other Ambulatory Visit: Payer: Self-pay | Admitting: Interventional Cardiology

## 2019-10-21 ENCOUNTER — Other Ambulatory Visit: Payer: Self-pay | Admitting: Internal Medicine

## 2019-10-29 ENCOUNTER — Other Ambulatory Visit: Payer: Self-pay | Admitting: Interventional Cardiology

## 2019-11-11 ENCOUNTER — Other Ambulatory Visit: Payer: Self-pay

## 2019-11-12 ENCOUNTER — Ambulatory Visit (INDEPENDENT_AMBULATORY_CARE_PROVIDER_SITE_OTHER): Payer: Medicare Other | Admitting: Obstetrics & Gynecology

## 2019-11-12 ENCOUNTER — Encounter: Payer: Self-pay | Admitting: Obstetrics & Gynecology

## 2019-11-12 VITALS — BP 130/82

## 2019-11-12 DIAGNOSIS — N813 Complete uterovaginal prolapse: Secondary | ICD-10-CM | POA: Diagnosis not present

## 2019-11-12 DIAGNOSIS — Z4689 Encounter for fitting and adjustment of other specified devices: Secondary | ICD-10-CM | POA: Diagnosis not present

## 2019-11-12 NOTE — Progress Notes (Signed)
    [name] [birth year] [id number]        86 y.o.  [id number]   RP: Pessary maintenance 4 months  HPI: Mild vagina discharge.  No vaginal bleeding.  No pelvic pain.  No UTI Sx.  Mild SUI, wearing a pad.     OB History  Gravida Para Term Preterm AB Living  4 2     2 2   SAB TAB Ectopic Multiple Live Births  2            # Outcome Date GA Lbr Len/2nd Weight Sex Delivery Anes PTL Lv  4 SAB           3 SAB           2 Para           1 Para             Past medical history,surgical history, problem list, medications, allergies, family history and social history were all reviewed and documented in the EPIC chart.   Directed ROS with pertinent positives and negatives documented in the history of present illness/assessment and plan.  Exam:  Vitals:   11/12/19 1452  BP: 130/82   General appearance:  Normal   Gynecologic exam: Vulva normal.  Pessary removed and cleaned.  Mild yellow d/c.  Vaginal mucosa intact.  Pessary put back in place.   Assessment/Plan:  86 y.o. [id number]   1. Pessary maintenance Doing well with pessary Milex ring #5 with support.  Pessary maintained done.  Vaginal mucosa intact.  Follow-up in 4 months for pessary maintenance.  Princess Bruins MD, 3:14 PM 11/12/2019

## 2019-11-15 ENCOUNTER — Encounter: Payer: Self-pay | Admitting: Obstetrics & Gynecology

## 2019-11-15 NOTE — Patient Instructions (Signed)
1. Pessary maintenance Doing well with pessary Milex ring #5 with support.  Pessary maintained done.  Vaginal mucosa intact.  Follow-up in 4 months for pessary maintenance.  Jennavie, it was a pleasure seeing you today!

## 2019-12-03 ENCOUNTER — Emergency Department (HOSPITAL_COMMUNITY)
Admission: EM | Admit: 2019-12-03 | Discharge: 2019-12-03 | Disposition: A | Discharge status: Home / Self Care | Payer: Medicare Other | Attending: Emergency Medicine | Admitting: Emergency Medicine

## 2019-12-03 ENCOUNTER — Other Ambulatory Visit: Payer: Self-pay

## 2019-12-03 ENCOUNTER — Telehealth: Payer: Self-pay | Admitting: Internal Medicine

## 2019-12-03 ENCOUNTER — Emergency Department (HOSPITAL_COMMUNITY): Payer: Medicare Other

## 2019-12-03 ENCOUNTER — Other Ambulatory Visit: Payer: Self-pay | Admitting: Internal Medicine

## 2019-12-03 ENCOUNTER — Encounter (HOSPITAL_COMMUNITY): Payer: Self-pay

## 2019-12-03 DIAGNOSIS — Z87891 Personal history of nicotine dependence: Secondary | ICD-10-CM | POA: Insufficient documentation

## 2019-12-03 DIAGNOSIS — I129 Hypertensive chronic kidney disease with stage 1 through stage 4 chronic kidney disease, or unspecified chronic kidney disease: Secondary | ICD-10-CM | POA: Insufficient documentation

## 2019-12-03 DIAGNOSIS — Z7901 Long term (current) use of anticoagulants: Secondary | ICD-10-CM | POA: Insufficient documentation

## 2019-12-03 DIAGNOSIS — S51012A Laceration without foreign body of left elbow, initial encounter: Secondary | ICD-10-CM

## 2019-12-03 DIAGNOSIS — H9193 Unspecified hearing loss, bilateral: Secondary | ICD-10-CM

## 2019-12-03 DIAGNOSIS — Z79899 Other long term (current) drug therapy: Secondary | ICD-10-CM | POA: Diagnosis not present

## 2019-12-03 DIAGNOSIS — W108XXA Fall (on) (from) other stairs and steps, initial encounter: Secondary | ICD-10-CM | POA: Insufficient documentation

## 2019-12-03 DIAGNOSIS — S50312A Abrasion of left elbow, initial encounter: Secondary | ICD-10-CM | POA: Insufficient documentation

## 2019-12-03 DIAGNOSIS — Y9301 Activity, walking, marching and hiking: Secondary | ICD-10-CM | POA: Insufficient documentation

## 2019-12-03 DIAGNOSIS — S59902A Unspecified injury of left elbow, initial encounter: Secondary | ICD-10-CM | POA: Diagnosis not present

## 2019-12-03 DIAGNOSIS — Y9289 Other specified places as the place of occurrence of the external cause: Secondary | ICD-10-CM | POA: Insufficient documentation

## 2019-12-03 DIAGNOSIS — I4891 Unspecified atrial fibrillation: Secondary | ICD-10-CM | POA: Diagnosis not present

## 2019-12-03 DIAGNOSIS — Y998 Other external cause status: Secondary | ICD-10-CM | POA: Diagnosis not present

## 2019-12-03 DIAGNOSIS — N183 Chronic kidney disease, stage 3 unspecified: Secondary | ICD-10-CM | POA: Diagnosis not present

## 2019-12-03 DIAGNOSIS — S0990XA Unspecified injury of head, initial encounter: Secondary | ICD-10-CM

## 2019-12-03 DIAGNOSIS — E039 Hypothyroidism, unspecified: Secondary | ICD-10-CM | POA: Insufficient documentation

## 2019-12-03 DIAGNOSIS — W19XXXA Unspecified fall, initial encounter: Secondary | ICD-10-CM

## 2019-12-03 MED ORDER — ACETAMINOPHEN 325 MG PO TABS
650.0000 mg | ORAL_TABLET | Freq: Once | ORAL | Status: AC
  Administered 2019-12-03: 650 mg via ORAL
  Filled 2019-12-03: qty 2

## 2019-12-03 NOTE — ED Provider Notes (Signed)
[place] DEPT Provider Note   CSN: [id number] Arrival date & time: 12/03/19  1500     History Chief Complaint  Patient presents with  . Fall  . Head Injury  . Abrasion    [name] is a 86 y.o. female history of hypertension, hyperlipidemia, obesity, CKD, osteoporosis, atrial fibrillation anticoagulated on Eliquis.  Patient arrives after a fall just prior to arrival.  She was walking up a single step and attempting to open a door when she lost her balance falling backwards and hitting the back of her head on cement.  She had immediate pain of the area moderate throbbing sensation which has been constant but gradually improving, nonradiating worsened with palpation.  Small abrasion was noted and patient has gradually developed a hematoma to the area.  She denies any loss of consciousness, headache or vision changes.  Additionally patient struck her left elbow on the cement as well and has a skin tear of the area denies significant pain of the elbow.  They called doctor's office but were encouraged to come to the ER given anticoagulation.  HPI     Past Medical History:  Diagnosis Date  . Adrenal myelolipoma   . Apical variant hypertrophic cardiomyopathy ([place])    Echo 4/18: Normal LV size with marked apical hypertrophy. LVEF 60-65%. No WMA. Mild MR. Moderate left atrial enlargement. Normal RV size and function. Mild right atrial enlargemen  . Chronic kidney disease   . DIVERTICULOSIS, COLON   . Dysmetabolic syndrome X   . FIBROMYALGIA   . Gastric ulcer due to Helicobacter pylori   . Hiatal hernia   . Hx of cataract surgery 01/08/2016  . HYPERLIPIDEMIA   . Hyperplastic colon polyp   . HYPERTENSION, ESSENTIAL NOS   . Hyponatremia   . Hypothyroidism   . MYCOSIS FUNGOIDES   . Nodule of left lung   . Normocytic anemia   . Olecranon fracture 01/07/2016  . OSTEOARTHRITIS   . OSTEOPOROSIS   . Persistent atrial fibrillation ([place])    s/p DCCV 2018  // Apixaban for anticoag  . Renal angiomyolipoma   . Unspecified vitamin D deficiency     Patient Active Problem List   Diagnosis Date Noted  . Female bladder prolapse 11/15/2017  . Decreased hearing of left ear 11/15/2017  . Iron deficiency anemia 09/10/2017  . Shortness of breath 09/10/2017  . Hyperglycemia 05/10/2017  . Persistent atrial fibrillation ([place]) 08/20/2016  . GERD (gastroesophageal reflux disease) 02/20/2016  . Left hip pain 01/20/2016  . Peripheral edema 01/20/2016  . Systolic murmur 78/29/5621  . Depression 07/27/2013  . Anxiety 07/18/2013  . Nodule of left lung 07/18/2013  . Apical variant hypertrophic cardiomyopathy ([place]) 08/23/2009  . Unspecified vitamin D deficiency 01/21/2009  . Chronic kidney disease (CKD), stage III (moderate) 01/21/2009  . MYCOSIS FUNGOIDES 05/21/2008  . DIVERTICULOSIS, COLON 05/21/2008  . OSTEOARTHRITIS 06/02/2007  . Hypothyroidism 04/28/2007  . HYPERLIPIDEMIA 04/28/2007  . Essential hypertension 04/28/2007  . Osteoporosis 04/28/2007  . FIBROMYALGIA 11/14/2006    Past Surgical History:  Procedure Laterality Date  . CARDIOVERSION N/A 01/03/2017   Procedure: CARDIOVERSION;  Surgeon: Josue Hector, MD;  Location: [facility] ENDOSCOPY;  Service: Cardiovascular;  Laterality: N/A;  . colonoscopy with polypectomy  07/25/2007   Dr Olevia Perches, Recheck in 7 years for 2009  . G 4 P 2    . I & D EXTREMITY Right 01/08/2016   Procedure: IRRIGATION AND DEBRIDEMENT OF OLECRANON FRACTURE;  Surgeon: Renette Butters,  MD;  Location: [facility];  Service: Orthopedics;  Laterality: Right;  . ORIF ELBOW FRACTURE Right 01/08/2016   Procedure: OPEN REDUCTION INTERNAL FIXATION (ORIF) ELBOW/OLECRANON FRACTURE, NEUROLYSIS;  Surgeon: Renette Butters, MD;  Location: [place];  Service: Orthopedics;  Laterality: Right;     OB History    Gravida  4   Para  2   Term      Preterm      AB  2   Living  2     SAB  2   TAB      Ectopic      Multiple      Live Births                Family History  Problem Relation Age of Onset  . Diabetes Mother   . Heart attack Maternal Uncle 76       his 2 sons had MI in 84s  . Hypertension Maternal Grandmother   . Stroke Maternal Grandmother 69  . Cancer Neg Hx   . Asthma Neg Hx   . COPD Neg Hx     Social History   Tobacco Use  . Smoking status: Former Smoker    Packs/day: 0.50    Years: 14.00    Pack years: 7.00    Quit date: 1956    Years since quitting: 65.5  . Smokeless tobacco: Never Used  . Tobacco comment: smoked 1951-1965 , up to 1/2 ppd  Vaping Use  . Vaping Use: Never used  Substance Use Topics  . Alcohol use: No    Alcohol/week: 0.0 standard drinks  . Drug use: No    Home Medications Prior to Admission medications   Medication Sig Start Date End Date Taking? Authorizing Provider  beta carotene w/minerals (OCUVITE) tablet Take 1 tablet by mouth daily.    [provider]  carvedilol (COREG) 3.125 MG tablet TAKE 1 TABLET BY MOUTH TWICE A DAY 10/30/19   Jettie Booze, MD  clindamycin (CLEOCIN) 2 % vaginal cream Place 1 Applicatorful vaginally at bedtime. 03/11/19   Princess Bruins, MD  ELIQUIS 5 MG TABS tablet TAKE 1 TABLET BY MOUTH TWICE A DAY 07/01/19   Jettie Booze, MD  ferrous sulfate 325 (65 FE) MG EC tablet Take 325 mg by mouth daily.    [provider]  furosemide (LASIX) 20 MG tablet Take 1 tablet (20 mg total) by mouth daily as needed. 10/06/19   Jettie Booze, MD  levothyroxine (SYNTHROID) 75 MCG tablet TAKE 1 TABLET BY MOUTH DAILY EXCEPT TAKE 1/2 TABLET ON TUESDAYS AND SATURDAY 10/21/19   Binnie Rail, MD  lisinopril (ZESTRIL) 40 MG tablet Take 1 tablet (40 mg total) by mouth daily. 04/09/19   End, Harrell Gave, MD  LORazepam (ATIVAN) 0.5 MG tablet TAKE 1/2 TABLET BY MOUTH DAILY AS NEEDED FOR ANXIETY 12/03/19   Burns, Claudina Lick, MD  omeprazole (PRILOSEC) 20 MG capsule TAKE 1 CAPSULE BY MOUTH EVERY DAY 07/30/19   Binnie Rail, MD  potassium  chloride SA (KLOR-CON M20) 20 MEQ tablet TAKE ONE TABLET BY MOUTH DAILY WHEN TAKING TORSEMIDE 10/21/19   Jettie Booze, MD    Allergies    Achromycin [tetracycline hcl], Celecoxib, Clarithromycin, Ezetimibe-simvastatin, Flagyl [metronidazole], Penicillins, Simvastatin, Clonazepam, Erythromycin, Sertraline, and Abilify [aripiprazole]  Review of Systems   Review of Systems Ten systems are reviewed and are negative for acute change except as noted in the HPI  Physical Exam Updated Vital Signs BP (!) 154/84 (  BP Location: Right Arm)   Pulse 86   Temp (!) 97.4 F (36.3 C) (Oral)   Resp 20   Ht 4\' 10"  (1.473 m)   Wt 68.5 kg   LMP  (LMP Unknown)   SpO2 99%   BMI 31.56 kg/m   Physical Exam Constitutional:      General: She is not in acute distress.    Appearance: Normal appearance. She is well-developed. She is obese. She is not ill-appearing or diaphoretic.  HENT:     Head: Normocephalic. Abrasion and contusion present. No raccoon eyes or Battle's sign.     Jaw: There is normal jaw occlusion. No trismus.      Right Ear: External ear normal. No hemotympanum.     Left Ear: External ear normal. No hemotympanum.     Nose:     Right Nostril: No epistaxis.     Left Nostril: No epistaxis.     Mouth/Throat:     Mouth: Mucous membranes are moist.     Pharynx: Oropharynx is clear.     Comments: No evidence of dental injury Eyes:     General: Vision grossly intact. Gaze aligned appropriately.     Extraocular Movements: Extraocular movements intact.     Pupils: Pupils are equal, round, and reactive to light.  Neck:     Trachea: Trachea and phonation normal. No tracheal tenderness or tracheal deviation.  Pulmonary:     Effort: Pulmonary effort is normal. No respiratory distress.  Abdominal:     General: There is no distension.     Palpations: Abdomen is soft.     Tenderness: There is no abdominal tenderness. There is no guarding or rebound.  Musculoskeletal:        General:  Normal range of motion.     Cervical back: Normal range of motion and neck supple. No spinous process tenderness or muscular tenderness.     Comments: No midline C/T/L spinal tenderness to palpation, no paraspinal muscle tenderness, no deformity, crepitus, or step-off noted. No sign of injury to the neck or back. - Pelvis stable to compression bilaterally without pain.  Patient able to bring bilateral knees towards chest without pain.  Bilateral lower extremities mobilized all major joints appropriate range of motion for age and body habitus.  Right upper extremity without evidence of injury.  Left upper extremity has a small skin tear overlying the olecranon process of the elbow, no active bleeding.  Range of motion of the left elbow intact without pain with appropriate strength.  All major joints of the bilateral upper extremities intact without pain.  Strong and equal radial pulses and pedal pulses.  Capillary refill and sensation intact to fingers and toes.  Skin:    General: Skin is warm and dry.  Neurological:     Mental Status: She is alert.     GCS: GCS eye subscore is 4. GCS verbal subscore is 5. GCS motor subscore is 6.     Comments: Speech is clear and goal oriented, follows commands Major Cranial nerves without deficit, no facial droop Normal strength in upper and lower extremities bilaterally including dorsiflexion and plantar flexion, strong and equal grip strength Sensation normal to light and sharp touch Moves extremities without ataxia, coordination intact  Psychiatric:        Behavior: Behavior normal.     ED Results / Procedures / Treatments   Labs (all labs ordered are listed, but only abnormal results are displayed) Labs Reviewed - No data to display  EKG None  Radiology DG Chest 1 View  Result Date: 12/03/2019 CLINICAL DATA:  Fall. EXAM: CHEST  1 VIEW COMPARISON:  January 08, 2016. FINDINGS: Stable cardiomediastinal silhouette. No pneumothorax or pleural effusion is  noted. Both lungs are clear. The visualized skeletal structures are unremarkable. IMPRESSION: No acute cardiopulmonary abnormality seen. Aortic Atherosclerosis (ICD10-I70.0). Electronically Signed   By: Marijo Conception M.D.   On: 12/03/2019 17:29   DG Pelvis 1-2 Views  Result Date: 12/03/2019 CLINICAL DATA:  Fall. EXAM: PELVIS - 1-2 VIEW COMPARISON:  None. FINDINGS: There is no evidence of pelvic fracture or diastasis. No pelvic bone lesions are seen. IMPRESSION: Negative. Electronically Signed   By: Marijo Conception M.D.   On: 12/03/2019 17:30   DG Elbow Complete Left  Result Date: 12/03/2019 CLINICAL DATA:  Fall EXAM: LEFT ELBOW - COMPLETE 3+ VIEW COMPARISON:  None. FINDINGS: No fracture or dislocation.  No significant elbow effusion. IMPRESSION: Negative. Electronically Signed   By: Donavan Foil M.D.   On: 12/03/2019 17:29   CT HEAD WO CONTRAST  Result Date: 12/03/2019 CLINICAL DATA:  Head trauma, anti coagulation.  Fall. EXAM: CT HEAD WITHOUT CONTRAST CT CERVICAL SPINE WITHOUT CONTRAST TECHNIQUE: Multidetector CT imaging of the head and cervical spine was performed following the standard protocol without intravenous contrast. Multiplanar CT image reconstructions of the cervical spine were also generated. COMPARISON:  11/10/2013 FINDINGS: CT HEAD FINDINGS Brain: Dilated perivascular space or remote lacunar infarct along the inferior margin of the right lentiform nucleus. Periventricular white matter and corona radiata hypodensities favor chronic ischemic microvascular white matter disease. Otherwise, the brainstem, cerebellum, cerebral peduncles, thalamus, basal ganglia, basilar cisterns, and ventricular system appear within normal limits. No intracranial hemorrhage, mass lesion, or acute CVA. Vascular: Unremarkable Skull: Unremarkable Sinuses/Orbits: Unremarkable Other: Large left parietal scalp hematoma. CT CERVICAL SPINE FINDINGS Alignment: No vertebral subluxation is observed. Skull base and  vertebrae: No cervical spine fracture or acute subluxation is present. 9 mm hemangioma eccentric to the right in the C7 vertebral body. Soft tissues and spinal canal: Aortic arch and branch vessel atherosclerotic vascular disease. Disc levels: Edema borderline cervical spondylosis bilateral foraminal narrowing at C3-4. Upper chest: Unremarkable Other: No supplemental non-categorized findings. IMPRESSION: 1. No acute intracranial findings or acute cervical spine findings. 2. Large left parietal scalp hematoma. 3. Periventricular white matter and corona radiata hypodensities favor chronic ischemic microvascular white matter disease. 4. Dilated perivascular space or remote lacunar infarct along the inferior margin of the right lentiform nucleus (chronic, stable). 5. Borderline bilateral foraminal narrowing at C3-4. 6. Aortic atherosclerosis. Aortic Atherosclerosis (ICD10-I70.0). Electronically Signed   By: Van Clines M.D.   On: 12/03/2019 18:09   CT CERVICAL SPINE WO CONTRAST  Result Date: 12/03/2019 CLINICAL DATA:  Head trauma, anti coagulation.  Fall. EXAM: CT HEAD WITHOUT CONTRAST CT CERVICAL SPINE WITHOUT CONTRAST TECHNIQUE: Multidetector CT imaging of the head and cervical spine was performed following the standard protocol without intravenous contrast. Multiplanar CT image reconstructions of the cervical spine were also generated. COMPARISON:  11/10/2013 FINDINGS: CT HEAD FINDINGS Brain: Dilated perivascular space or remote lacunar infarct along the inferior margin of the right lentiform nucleus. Periventricular white matter and corona radiata hypodensities favor chronic ischemic microvascular white matter disease. Otherwise, the brainstem, cerebellum, cerebral peduncles, thalamus, basal ganglia, basilar cisterns, and ventricular system appear within normal limits. No intracranial hemorrhage, mass lesion, or acute CVA. Vascular: Unremarkable Skull: Unremarkable Sinuses/Orbits: Unremarkable Other: Large  left parietal scalp hematoma. CT CERVICAL SPINE FINDINGS Alignment:  No vertebral subluxation is observed. Skull base and vertebrae: No cervical spine fracture or acute subluxation is present. 9 mm hemangioma eccentric to the right in the C7 vertebral body. Soft tissues and spinal canal: Aortic arch and branch vessel atherosclerotic vascular disease. Disc levels: Edema borderline cervical spondylosis bilateral foraminal narrowing at C3-4. Upper chest: Unremarkable Other: No supplemental non-categorized findings. IMPRESSION: 1. No acute intracranial findings or acute cervical spine findings. 2. Large left parietal scalp hematoma. 3. Periventricular white matter and corona radiata hypodensities favor chronic ischemic microvascular white matter disease. 4. Dilated perivascular space or remote lacunar infarct along the inferior margin of the right lentiform nucleus (chronic, stable). 5. Borderline bilateral foraminal narrowing at C3-4. 6. Aortic atherosclerosis. Aortic Atherosclerosis (ICD10-I70.0). Electronically Signed   By: Van Clines M.D.   On: 12/03/2019 18:09    Procedures Procedures (including critical care time)  Medications Ordered in ED Medications  acetaminophen (TYLENOL) tablet 650 mg (has no administration in time range)    ED Course  I have reviewed the triage vital signs and the nursing notes.  Pertinent labs & imaging results that were available during my care of the patient were reviewed by me and considered in my medical decision making (see chart for details).    MDM Rules/Calculators/A&P                          Additional History Obtained: 1. Nursing notes from this visit. 2. Family, patient's daughter at bedside. --- 86 year old female presents today after fall striking the back of her head on cement. ABCs intact. She has a hematoma present to the back of her occipital scalp with a small abrasion in addition to a skin tear of the left elbow.  She is anticoagulated on  Eliquis.  Level 2 trauma activated given head injury on blood thinners.  Trauma lab work obtained.  Will obtain screening chest x-ray and pelvis x-ray.  CT head and cervical spine ordered.  Additionally will obtain x-ray of the left elbow.  She has no evidence of significant injury of the neck, back, chest, abdomen or pelvis.  Good range of motion and strength with all joints without pain.  Tdap up-to-date per patient and daughter. ------------------ DG Chest:    IMPRESSION:  No acute cardiopulmonary abnormality seen.    Aortic Atherosclerosis (ICD10-I70.0).  I personally reviewed patient's chest x-ray and agree with radiologist interpretation.  No obvious fractures or pneumothorax.  DG Pelvis:  IMPRESSION:  Negative.  I personally reviewed patient's pelvis x-ray agree with radiologist interpretation.  No obvious fractures or diastases.  DG Left Elbow:  IMPRESSION:  Negative.  I personally reviewed patient's left elbow x-ray and agree with radiologist interpretation.  No obvious fracture or dislocation.  CT head/cervical spine:  FINDINGS:  CT HEAD FINDINGS    Brain: Dilated perivascular space or remote lacunar infarct along  the inferior margin of the right lentiform nucleus.    Periventricular white matter and corona radiata hypodensities favor  chronic ischemic microvascular white matter disease. Otherwise, the  brainstem, cerebellum, cerebral peduncles, thalamus, basal ganglia,  basilar cisterns, and ventricular system appear within normal  limits. No intracranial hemorrhage, mass lesion, or acute CVA.    Vascular: Unremarkable    Skull: Unremarkable    Sinuses/Orbits: Unremarkable    Other: Large left parietal scalp hematoma.    CT CERVICAL SPINE FINDINGS    Alignment: No vertebral subluxation is observed.    Skull base and vertebrae: No cervical  spine fracture or acute  subluxation is present. 9 mm hemangioma eccentric to the right in  the C7 vertebral  body.    Soft tissues and spinal canal: Aortic arch and branch vessel  atherosclerotic vascular disease.    Disc levels: Edema borderline cervical spondylosis bilateral  foraminal narrowing at C3-4.    Upper chest: Unremarkable    Other: No supplemental non-categorized findings.    IMPRESSION:  1. No acute intracranial findings or acute cervical spine findings.  2. Large left parietal scalp hematoma.  3. Periventricular white matter and corona radiata hypodensities  favor chronic ischemic microvascular white matter disease.  4. Dilated perivascular space or remote lacunar infarct along the  inferior margin of the right lentiform nucleus (chronic, stable).  5. Borderline bilateral foraminal narrowing at C3-4.  6. Aortic atherosclerosis.  I have personally reviewed patient's CT head and agree with radiologist interpretation.  No obvious intracranial hemorrhage.  I have personally reviewed patient's cervical spine, no obvious fracture/dislocation, agree with radiologist interpretation. - Patient reevaluated she is resting comfortably no acute distress reports she is feeling well, she is requesting discharge.  Seen and evaluated by Dr. Ralene Bathe during this visit.  Plan of care is discharged with PCP follow-up.  No evidence of significant injury of the chest abdomen pelvis or extremities requiring further work-up at this time.  Patient's skin tear was cleaned and dressed by nursing staff no negation for repair at this time, additionally no indication for antibiotics.  Tdap up-to-date per patient and family member.  Patient informed of wound care at home.  Blood work was initially ordered however after reassuring imaging do not feel there is indication for blood work at this time given mechanical fall, discussed with Dr. Ralene Bathe who agrees.  At this time there does not appear to be any evidence of an acute emergency medical condition and the patient appears stable for discharge with appropriate  outpatient follow up. Diagnosis was discussed with patient who verbalizes understanding of care plan and is agreeable to discharge. I have discussed return precautions with patient and daughter. who verbalizes understanding. Patient encouraged to follow-up with their PCP. All questions answered.   Note: Portions of this report may have been transcribed using voice recognition software. Every effort was made to ensure accuracy; however, inadvertent computerized transcription errors may still be present. Final Clinical Impression(s) / ED Diagnoses Final diagnoses:  Fall  Injury of head, initial encounter  Fall, initial encounter  Skin tear of left elbow without complication, initial encounter    Rx / DC Orders ED Discharge Orders    None       Gari Crown 12/03/19 1901    Quintella Reichert, MD 12/03/19 2359

## 2019-12-03 NOTE — Telephone Encounter (Addendum)
    Patient calling to request a referral to [facility], for hearing eval Phone [phone]

## 2019-12-03 NOTE — Telephone Encounter (Signed)
New message:   Pt's daughter is calling and states the patient has had a fall and now has a knot on her head. I have transferred the pt to [facility].

## 2019-12-03 NOTE — Discharge Instructions (Addendum)
At this time there does not appear to be the presence of an emergent medical condition, however there is always the potential for conditions to change. Please read and follow the below instructions.  Please return to the Emergency Department immediately for any new or worsening symptoms. Please be sure to follow up with your Primary Care Provider within one week regarding your visit today; please call their office to schedule an appointment even if you are feeling better for a follow-up visit. Keep the skin tear of your left elbow clean.  Rinse gently with soapy water every day.  Keep covered with sterile bandage and antibiotic ointment.  Have this area rechecked by your primary care doctor at your follow-up visit next week.  Return to the ER if you develop signs of infection. Your CT scan today showed large left parietal scalp hematoma, periventricular white matter and corona radiata hypodensities of the brain, dilated perivascular space or remote lacunar infarct along the inferior margin of the right lentiform nucleus which appears chronic, borderline bilateral foraminal narrowing at C3-C4, aortic atherosclerosis, 9 mm hemangioma eccentric to the right C7 vertebral body.  Please discuss these incidental findings with your primary care provider at your follow-up visit.  Get help right away if: You have: A very bad headache that is not helped by medicine. Trouble walking or weakness in your arms and legs. Clear or bloody fluid coming from your nose or ears. Changes in how you see (vision). Shaking movements that you cannot control. You lose your balance. You vomit. The black centers of your eyes (pupils) change in size. Your speech is slurred. Your dizziness gets worse. You pass out. You develop severe swelling around the wound. The wound is on your hand or foot and you cannot properly move a finger or toe. The wound is on your hand or foot, and you notice that your fingers or toes look pale or  bluish. You have a red streak going away from your wound. You are sleepier than normal and have trouble staying awake. You have any new/concerning or worsening of symptoms These symptoms may be an emergency. Do not wait to see if the symptoms will go away. Get medical help right away. Call your local emergency services (911 in the [place] Do not drive yourself to the hospital.  Please read the additional information packets attached to your discharge summary.  Do not take your medicine if  develop an itchy rash, swelling in your mouth or lips, or difficulty breathing; call 911 and seek immediate emergency medical attention if this occurs.  You may review your lab tests and imaging results in their entirety on your MyChart account.  Please discuss all results of fully with your primary care provider and other specialist at your follow-up visit.  Note: Portions of this text may have been transcribed using voice recognition software. Every effort was made to ensure accuracy; however, inadvertent computerized transcription errors may still be present.

## 2019-12-03 NOTE — ED Triage Notes (Addendum)
Patient was coming into a door and had to go up one step. The patient went backwards and hit her head on a patio. Patient has a hematoma to the back of the head. No bleeding noted. patient is on eliquis.  Abrasion noted to the left elbow

## 2019-12-03 NOTE — Telephone Encounter (Signed)
ordered

## 2019-12-03 NOTE — Telephone Encounter (Signed)
Per [facility], Opal Sidles, patient's daughter, states [name] fell and hit the back of her head on concrete about an hour ago. No cuts to the skin. She has swelling on the back of her head that is about the size of the palm of her hand. No fever, cold or cough symptoms. Alert and responsive  Advised to go to ED

## 2019-12-06 ENCOUNTER — Other Ambulatory Visit: Payer: Self-pay

## 2019-12-06 ENCOUNTER — Encounter (HOSPITAL_COMMUNITY): Payer: Self-pay

## 2019-12-06 ENCOUNTER — Ambulatory Visit (HOSPITAL_COMMUNITY)
Admission: EM | Admit: 2019-12-06 | Discharge: 2019-12-06 | Disposition: A | Discharge status: Home / Self Care | Payer: Medicare Other | Attending: Family Medicine | Admitting: Family Medicine

## 2019-12-06 DIAGNOSIS — S0003XD Contusion of scalp, subsequent encounter: Secondary | ICD-10-CM

## 2019-12-06 DIAGNOSIS — R220 Localized swelling, mass and lump, head: Secondary | ICD-10-CM | POA: Diagnosis not present

## 2019-12-06 NOTE — Discharge Instructions (Signed)
Elevate head of bed Cool compresses over eyes Watch the arm wounds for infection Follow up with your doctor

## 2019-12-06 NOTE — ED Provider Notes (Signed)
[facility] [place]    CSN: [id number] Arrival date & time: 12/06/19  1558      History   Chief Complaint Chief Complaint  Patient presents with  . Facial Swelling    HPI [name] is a 86 y.o. female.   HPI  93 86 year old woman who is here for emergency room follow-up She has brought in by her daughter She was seen in the emergency room on 12/03/2019 for a fall.  She fell backwards while she was going up the stairs.  She landed on a concrete surface.  She is on Eliquis.  She was taken to the emergency room and evaluated.  She had a CT scan of her head and her neck.  Also bony x-rays.  Everything was negative.  Patient was well enough to be sent home.  Strict instructions to return for any worsening symptoms. She is here today because she has swelling around both eyes.  Patient was unaware of this, but the daughter noticed it when she went to visit with her this morning.  Daughter visits with her daily and it was not present yesterday.  She has no headache.  Mild neck pain.  She is taking Tylenol for neck pain as needed.  No visual changes.  No problems with ears or hearing.  No problems with strength in arms or legs, feeling, balance.  The only finding that is new, needs evaluation, is the eye swelling left greater than right  Past Medical History:  Diagnosis Date  . Adrenal myelolipoma   . Apical variant hypertrophic cardiomyopathy ([place])    Echo 4/18: Normal LV size with marked apical hypertrophy. LVEF 60-65%. No WMA. Mild MR. Moderate left atrial enlargement. Normal RV size and function. Mild right atrial enlargemen  . Chronic kidney disease   . DIVERTICULOSIS, COLON   . Dysmetabolic syndrome X   . FIBROMYALGIA   . Gastric ulcer due to Helicobacter pylori   . Hiatal hernia   . Hx of cataract surgery 01/08/2016  . HYPERLIPIDEMIA   . Hyperplastic colon polyp   . HYPERTENSION, ESSENTIAL NOS   . Hyponatremia   . Hypothyroidism   . MYCOSIS FUNGOIDES   . Nodule of  left lung   . Normocytic anemia   . Olecranon fracture 01/07/2016  . OSTEOARTHRITIS   . OSTEOPOROSIS   . Persistent atrial fibrillation ([place])    s/p DCCV 2018 // Apixaban for anticoag  . Renal angiomyolipoma   . Unspecified vitamin D deficiency     Patient Active Problem List   Diagnosis Date Noted  . Female bladder prolapse 11/15/2017  . Decreased hearing of left ear 11/15/2017  . Iron deficiency anemia 09/10/2017  . Shortness of breath 09/10/2017  . Hyperglycemia 05/10/2017  . Persistent atrial fibrillation ([place]) 08/20/2016  . GERD (gastroesophageal reflux disease) 02/20/2016  . Left hip pain 01/20/2016  . Peripheral edema 01/20/2016  . Systolic murmur 90/30/0923  . Depression 07/27/2013  . Anxiety 07/18/2013  . Nodule of left lung 07/18/2013  . Apical variant hypertrophic cardiomyopathy ([place]) 08/23/2009  . Unspecified vitamin D deficiency 01/21/2009  . Chronic kidney disease (CKD), stage III (moderate) 01/21/2009  . MYCOSIS FUNGOIDES 05/21/2008  . DIVERTICULOSIS, COLON 05/21/2008  . OSTEOARTHRITIS 06/02/2007  . Hypothyroidism 04/28/2007  . HYPERLIPIDEMIA 04/28/2007  . Essential hypertension 04/28/2007  . Osteoporosis 04/28/2007  . FIBROMYALGIA 11/14/2006    Past Surgical History:  Procedure Laterality Date  . CARDIOVERSION N/A 01/03/2017   Procedure: CARDIOVERSION;  Surgeon: Josue Hector, MD;  Location: [facility];  Service: Cardiovascular;  Laterality: N/A;  . colonoscopy with polypectomy  07/25/2007   Dr Olevia Perches, Recheck in 7 years for 2009  . G 4 P 2    . I & D EXTREMITY Right 01/08/2016   Procedure: IRRIGATION AND DEBRIDEMENT OF OLECRANON FRACTURE;  Surgeon: Renette Butters, MD;  Location: [facility];  Service: Orthopedics;  Laterality: Right;  . ORIF ELBOW FRACTURE Right 01/08/2016   Procedure: OPEN REDUCTION INTERNAL FIXATION (ORIF) ELBOW/OLECRANON FRACTURE, NEUROLYSIS;  Surgeon: Renette Butters, MD;  Location: [place];  Service: Orthopedics;  Laterality: Right;     OB History    Gravida  4   Para  2   Term      Preterm      AB  2   Living  2     SAB  2   TAB      Ectopic      Multiple      Live Births               Home Medications    Prior to Admission medications   Medication Sig Start Date End Date Taking? Authorizing Provider  beta carotene w/minerals (OCUVITE) tablet Take 1 tablet by mouth daily.    [provider]  carvedilol (COREG) 3.125 MG tablet TAKE 1 TABLET BY MOUTH TWICE A DAY 10/30/19   Jettie Booze, MD  clindamycin (CLEOCIN) 2 % vaginal cream Place 1 Applicatorful vaginally at bedtime. 03/11/19   Princess Bruins, MD  ELIQUIS 5 MG TABS tablet TAKE 1 TABLET BY MOUTH TWICE A DAY 07/01/19   Jettie Booze, MD  ferrous sulfate 325 (65 FE) MG EC tablet Take 325 mg by mouth daily.    [provider]  furosemide (LASIX) 20 MG tablet Take 1 tablet (20 mg total) by mouth daily as needed. 10/06/19   Jettie Booze, MD  levothyroxine (SYNTHROID) 75 MCG tablet TAKE 1 TABLET BY MOUTH DAILY EXCEPT TAKE 1/2 TABLET ON TUESDAYS AND SATURDAY 10/21/19   Binnie Rail, MD  lisinopril (ZESTRIL) 40 MG tablet Take 1 tablet (40 mg total) by mouth daily. 04/09/19   End, Harrell Gave, MD  LORazepam (ATIVAN) 0.5 MG tablet TAKE 1/2 TABLET BY MOUTH DAILY AS NEEDED FOR ANXIETY 12/03/19   Burns, Claudina Lick, MD  omeprazole (PRILOSEC) 20 MG capsule TAKE 1 CAPSULE BY MOUTH EVERY DAY 07/30/19   Binnie Rail, MD  potassium chloride SA (KLOR-CON M20) 20 MEQ tablet TAKE ONE TABLET BY MOUTH DAILY WHEN TAKING TORSEMIDE 10/21/19   Jettie Booze, MD    Family History Family History  Problem Relation Age of Onset  . Diabetes Mother   . Heart attack Maternal Uncle 28       his 2 sons had MI in 8s  . Hypertension Maternal Grandmother   . Stroke Maternal Grandmother 69  . Cancer Neg Hx   . Asthma Neg Hx   . COPD Neg Hx     Social History Social History   Tobacco Use  . Smoking status: Former Smoker     Packs/day: 0.50    Years: 14.00    Pack years: 7.00    Quit date: 1956    Years since quitting: 65.5  . Smokeless tobacco: Never Used  . Tobacco comment: smoked 1951-1965 , up to 1/2 ppd  Vaping Use  . Vaping Use: Never used  Substance Use Topics  . Alcohol use: No    Alcohol/week: 0.0 standard drinks  .  Drug use: No     Allergies   Achromycin [tetracycline hcl], Celecoxib, Clarithromycin, Ezetimibe-simvastatin, Flagyl [metronidazole], Penicillins, Simvastatin, Clonazepam, Erythromycin, Sertraline, and Abilify [aripiprazole]   Review of Systems Review of Systems Facial swelling.  See HPI  Physical Exam Triage Vital Signs ED Triage Vitals  Enc Vitals Group     BP 12/06/19 1646 (!) 142/69     Pulse Rate 12/06/19 1646 76     Resp 12/06/19 1646 16     Temp 12/06/19 1646 (!) 97.4 F (36.3 C)     Temp src --      SpO2 12/06/19 1646 96 %     Weight --      Height --      Head Circumference --      Peak Flow --      Pain Score 12/06/19 1642 7     Pain Loc --      Pain Edu? --      Excl. in [place]? --    No data found.  Updated Vital Signs BP (!) 142/69   Pulse 76   Temp (!) 97.4 F (36.3 C)   Resp 16   LMP  (LMP Unknown)   SpO2 96%   Visual Acuity Right Eye Distance:   Left Eye Distance:   Bilateral Distance:    Right Eye Near:   Left Eye Near:    Bilateral Near:     Physical Exam Constitutional:      General: She is not in acute distress.    Appearance: She is well-developed.     Comments: Overweight.  Stooped posture.  Small steps.  HENT:     Head: Normocephalic.      Right Ear: Tympanic membrane, ear canal and external ear normal.     Left Ear: Tympanic membrane, ear canal and external ear normal.     Nose: Nose normal.     Mouth/Throat:     Mouth: Mucous membranes are moist.  Eyes:     General: Vision grossly intact.     Extraocular Movements:     Right eye: Normal extraocular motion.     Left eye: Normal extraocular motion.      Conjunctiva/sclera: Conjunctivae normal.     Pupils: Pupils are equal, round, and reactive to light.     Comments: Eyes are clear.  Vision intact.  Pupils reactive.  Brief view of fundi negative.  Swelling of upper and lower lids is present, left greater than right, puffy translucent appearance  Cardiovascular:     Rate and Rhythm: Normal rate and regular rhythm.     Heart sounds: Normal heart sounds.  Pulmonary:     Effort: Pulmonary effort is normal. No respiratory distress.  Musculoskeletal:        General: Normal range of motion.     Cervical back: Normal range of motion.  Skin:    General: Skin is warm and dry.  Neurological:     Mental Status: She is alert.  Psychiatric:        Mood and Affect: Mood normal.        Behavior: Behavior normal.     Comments: Lucid.  Talkative.  Slightly hard of hearing      UC Treatments / Results  Labs (all labs ordered are listed, but only abnormal results are displayed) Labs Reviewed - No data to display  EKG   Radiology No results found.  Procedures Procedures (including critical care time)  Medications Ordered in UC Medications - No  data to display  Initial Impression / Assessment and Plan / UC Course  I have reviewed the triage vital signs and the nursing notes.  Pertinent labs & imaging results that were available during my care of the patient were reviewed by me and considered in my medical decision making (see chart for details).     Patient is examined with Dr. Robyn Haber.  With her large scalp hematoma and the resolving bruising that is dissipating down around her neck left greater than right it is felt that the soft tissue swelling and inflammation is impairing the circulation to the face.  This is most apparent around the eyes.  Care and warning signs were reviewed with patient and her daughter Final Clinical Impressions(s) / UC Diagnoses   Final diagnoses:  Swelling of face  Hematoma of scalp, subsequent  encounter     Discharge Instructions     Elevate head of bed Cool compresses over eyes Watch the arm wounds for infection Follow up with your doctor   ED Prescriptions    None     PDMP not reviewed this encounter.   Raylene Everts, MD 12/06/19 1739

## 2019-12-06 NOTE — ED Triage Notes (Signed)
Patient reports fall on Thursday where she did hit the back side of her head. Reports bilateral eye swelling since. Reports pain 10/10 prior to coming to UC. 7/10 in UC; took tylenol this morning.   Daughter is historian; she is at bedside.

## 2019-12-08 ENCOUNTER — Telehealth: Payer: Self-pay

## 2019-12-08 NOTE — Telephone Encounter (Signed)
Per [facility] on 12/06/2019 3:08:28 PM, Caller's mom fell on Thursday night and went to the ER. She had a hematoma on the back of her head to the left side. She was checked out and discharged and she was fine until she woke up this a.m. with a very swollen left eye (same side as hematoma.) Asking if this is a normal sxs several days after a head injury. Never had changes of behavior/sleepiness. Just soreness from fall. Asking what to do. Wants to avoid ER if possible but willing to go if necessary.  Original hematoma has gone down some. Eyelid and underneath is swollen. Not painful. Underneath the other eye is a little swollen too. No headache. Balance is okay. Vision is okay.   Patient was see in [facility] at [facility]

## 2019-12-28 NOTE — Progress Notes (Signed)
Subjective:    Patient ID: [name], female    DOB: 09-Jun-1927, 86 y.o.   MRN: [id number]  HPI The patient is here for follow up of their chronic medical problems, including Afib, htn, hypothyroidism, GERD, iron def anemia, anxiety, hyperglycemia, stage 3 CKD  She is taking all of her medications as prescribed.    She stopped the wellbutrin after her last visit. She denies depression.    She feels nauseous first thing in the morning.  Eating something helps.  She denies she denies GERD.  She takes the lasix about twice a week.  This controls the Lasix.  She does not take the potassium because she does not tolerate it.   Medications and allergies reviewed with patient and updated if appropriate.  Patient Active Problem List   Diagnosis Date Noted   Female bladder prolapse 11/15/2017   Decreased hearing of left ear 11/15/2017   Iron deficiency anemia 09/10/2017   Shortness of breath 09/10/2017   Hyperglycemia 05/10/2017   Persistent atrial fibrillation ([place]) 08/20/2016   GERD (gastroesophageal reflux disease) 02/20/2016   Left hip pain 01/20/2016   Peripheral edema 83/66/2947   Systolic murmur 65/46/5035   Depression 07/27/2013   Anxiety 07/18/2013   Nodule of left lung 07/18/2013   Apical variant hypertrophic cardiomyopathy ([place]) 08/23/2009   Vitamin D deficiency 01/21/2009   Chronic kidney disease (CKD), stage III (moderate) 01/21/2009   MYCOSIS FUNGOIDES 05/21/2008   DIVERTICULOSIS, COLON 05/21/2008   OSTEOARTHRITIS 06/02/2007   Hypothyroidism 04/28/2007   HYPERLIPIDEMIA 04/28/2007   Essential hypertension 04/28/2007   Osteoporosis 04/28/2007   FIBROMYALGIA 11/14/2006    Current Outpatient Medications on File Prior to Visit  Medication Sig Dispense Refill   beta carotene w/minerals (OCUVITE) tablet Take 1 tablet by mouth daily.     carvedilol (COREG) 3.125 MG tablet TAKE 1 TABLET BY MOUTH TWICE A DAY 180 tablet 3   ferrous  sulfate 325 (65 FE) MG EC tablet Take 325 mg by mouth daily.     furosemide (LASIX) 20 MG tablet Take 1 tablet (20 mg total) by mouth daily as needed. 30 tablet 11   levothyroxine (SYNTHROID) 75 MCG tablet TAKE 1 TABLET BY MOUTH DAILY EXCEPT TAKE 1/2 TABLET ON TUESDAYS AND SATURDAY 72 tablet 0   lisinopril (ZESTRIL) 40 MG tablet Take 1 tablet (40 mg total) by mouth daily. 90 tablet 3   LORazepam (ATIVAN) 0.5 MG tablet TAKE 1/2 TABLET BY MOUTH DAILY AS NEEDED FOR ANXIETY 30 tablet 1   omeprazole (PRILOSEC) 20 MG capsule TAKE 1 CAPSULE BY MOUTH EVERY DAY 90 capsule 3   potassium chloride SA (KLOR-CON M20) 20 MEQ tablet TAKE ONE TABLET BY MOUTH DAILY WHEN TAKING TORSEMIDE (Patient not taking: Reported on 12/29/2019) 90 tablet 3   No current facility-administered medications on file prior to visit.    Past Medical History:  Diagnosis Date   Adrenal myelolipoma    Apical variant hypertrophic cardiomyopathy ([place])    Echo 4/18: Normal LV size with marked apical hypertrophy. LVEF 60-65%. No WMA. Mild MR. Moderate left atrial enlargement. Normal RV size and function. Mild right atrial enlargemen   Chronic kidney disease    DIVERTICULOSIS, COLON    Dysmetabolic syndrome X    FIBROMYALGIA    Gastric ulcer due to Helicobacter pylori    Hiatal hernia    Hx of cataract surgery 01/08/2016   HYPERLIPIDEMIA    Hyperplastic colon polyp    HYPERTENSION, ESSENTIAL NOS    Hyponatremia  Hypothyroidism    MYCOSIS FUNGOIDES    Nodule of left lung    Normocytic anemia    Olecranon fracture 01/07/2016   OSTEOARTHRITIS    OSTEOPOROSIS    Persistent atrial fibrillation ([place])    s/p DCCV 2018 // Apixaban for anticoag   Renal angiomyolipoma    Unspecified vitamin D deficiency     Past Surgical History:  Procedure Laterality Date   CARDIOVERSION N/A 01/03/2017   Procedure: CARDIOVERSION;  Surgeon: Josue Hector, MD;  Location: [facility] ENDOSCOPY;  Service: Cardiovascular;   Laterality: N/A;   colonoscopy with polypectomy  07/25/2007   Dr Olevia Perches, Recheck in 7 years for 2009   G 4 P 2     I & D EXTREMITY Right 01/08/2016   Procedure: IRRIGATION AND DEBRIDEMENT OF OLECRANON FRACTURE;  Surgeon: Renette Butters, MD;  Location: [place];  Service: Orthopedics;  Laterality: Right;   ORIF ELBOW FRACTURE Right 01/08/2016   Procedure: OPEN REDUCTION INTERNAL FIXATION (ORIF) ELBOW/OLECRANON FRACTURE, NEUROLYSIS;  Surgeon: Renette Butters, MD;  Location: [place];  Service: Orthopedics;  Laterality: Right;    Social History   Socioeconomic History   Marital status: Widowed    Spouse name: Not on file   Number of children: 2   Years of education: Not on file   Highest education level: Not on file  Occupational History   Occupation: Retired    Fish farm manager: RETIRED  Tobacco Use   Smoking status: Former Smoker    Packs/day: 0.50    Years: 14.00    Pack years: 7.00    Quit date: 1956    Years since quitting: 65.6   Smokeless tobacco: Never Used   Tobacco comment: smoked 1951-1965 , up to 1/2 ppd  Vaping Use   Vaping Use: Never used  Substance and Sexual Activity   Alcohol use: No    Alcohol/week: 0.0 standard drinks   Drug use: No   Sexual activity: Never  Other Topics Concern   Not on file  Social History Narrative   Widowed.  Lives alone.  Ambulates without assistance.  Steady on feet.   Social Determinants of Health   Financial Resource Strain:    Difficulty of Paying Living Expenses:   Food Insecurity:    Worried About Charity fundraiser in the Last Year:    Arboriculturist in the Last Year:   Transportation Needs:    Film/video editor (Medical):    Lack of Transportation (Non-Medical):   Physical Activity:    Days of Exercise per Week:    Minutes of Exercise per Session:   Stress:    Feeling of Stress :   Social Connections:    Frequency of Communication with Friends and Family:    Frequency of Social Gatherings with  Friends and Family:    Attends Religious Services:    Active Member of Clubs or Organizations:    Attends Music therapist:    Marital Status:     Family History  Problem Relation Age of Onset   Diabetes Mother    Heart attack Maternal Uncle 43       his 2 sons had MI in 42s   Hypertension Maternal Grandmother    Stroke Maternal Grandmother 71   Cancer Neg Hx    Asthma Neg Hx    COPD Neg Hx     Review of Systems  Constitutional: Negative for fever.  Respiratory: Negative for cough, shortness of breath and wheezing.  Cardiovascular: Positive for leg swelling (minimal). Negative for chest pain and palpitations.  Neurological: Negative for light-headedness and headaches.  Psychiatric/Behavioral: Negative for dysphoric mood. The patient is nervous/anxious (at times).        Objective:   Vitals:   12/29/19 1428  BP: (!) 134/76  Pulse: 97  Temp: 98 F (36.7 C)  SpO2: 98%   BP Readings from Last 3 Encounters:  12/29/19 (!) 134/76  12/06/19 (!) 142/69  12/03/19 (!) 154/84   Wt Readings from Last 3 Encounters:  12/29/19 147 lb (66.7 kg)  12/03/19 151 lb (68.5 kg)  09/24/19 152 lb 12.8 oz (69.3 kg)   Body mass index is 30.72 kg/m.   Physical Exam    Constitutional: Appears well-developed and well-nourished. No distress.  HENT:  Head: Normocephalic and atraumatic.  Neck: Neck supple. No tracheal deviation present. No thyromegaly present.  No cervical lymphadenopathy Cardiovascular: Normal rate, regular rhythm and normal heart sounds.   No murmur heard. No carotid bruit .  No edema Pulmonary/Chest: Effort normal and breath sounds normal. No respiratory distress. No has no wheezes. No rales.  Skin: Skin is warm and dry. Not diaphoretic.  Psychiatric: Normal mood and affect. Behavior is normal.      Assessment & Plan:    See Problem List for Assessment and Plan of chronic medical problems.    This visit occurred during the SARS-CoV-2  public health emergency.  Safety protocols were in place, including screening questions prior to the visit, additional usage of staff PPE, and extensive cleaning of exam room while observing appropriate contact time as indicated for disinfecting solutions.

## 2019-12-28 NOTE — Patient Instructions (Addendum)
You can try vitamin B12 1000 mcg daily to see if that helps with your energy.    Blood work was ordered.     Medications reviewed and updated.  Changes include :   None     Please followup in 6 months

## 2019-12-29 ENCOUNTER — Other Ambulatory Visit: Payer: Self-pay | Admitting: Interventional Cardiology

## 2019-12-29 ENCOUNTER — Other Ambulatory Visit: Payer: Self-pay

## 2019-12-29 ENCOUNTER — Ambulatory Visit (INDEPENDENT_AMBULATORY_CARE_PROVIDER_SITE_OTHER): Payer: Medicare Other | Admitting: Internal Medicine

## 2019-12-29 ENCOUNTER — Encounter: Payer: Self-pay | Admitting: Internal Medicine

## 2019-12-29 VITALS — BP 134/76 | HR 97 | Temp 98.0°F | Ht <= 58 in | Wt 147.0 lb

## 2019-12-29 DIAGNOSIS — F419 Anxiety disorder, unspecified: Secondary | ICD-10-CM | POA: Diagnosis not present

## 2019-12-29 DIAGNOSIS — R609 Edema, unspecified: Secondary | ICD-10-CM

## 2019-12-29 DIAGNOSIS — I10 Essential (primary) hypertension: Secondary | ICD-10-CM | POA: Diagnosis not present

## 2019-12-29 DIAGNOSIS — E039 Hypothyroidism, unspecified: Secondary | ICD-10-CM | POA: Diagnosis not present

## 2019-12-29 DIAGNOSIS — N1831 Chronic kidney disease, stage 3a: Secondary | ICD-10-CM

## 2019-12-29 DIAGNOSIS — E559 Vitamin D deficiency, unspecified: Secondary | ICD-10-CM

## 2019-12-29 DIAGNOSIS — K219 Gastro-esophageal reflux disease without esophagitis: Secondary | ICD-10-CM | POA: Diagnosis not present

## 2019-12-29 DIAGNOSIS — R739 Hyperglycemia, unspecified: Secondary | ICD-10-CM

## 2019-12-29 DIAGNOSIS — F3289 Other specified depressive episodes: Secondary | ICD-10-CM

## 2019-12-29 DIAGNOSIS — D509 Iron deficiency anemia, unspecified: Secondary | ICD-10-CM

## 2019-12-29 DIAGNOSIS — I4819 Other persistent atrial fibrillation: Secondary | ICD-10-CM

## 2019-12-29 NOTE — Assessment & Plan Note (Signed)
chronic Controlled Mild edema on exam Taking lasix 2/week Not taking potassium - it makes her sick cmp

## 2019-12-29 NOTE — Assessment & Plan Note (Signed)
Chronic cmp 

## 2019-12-29 NOTE — Assessment & Plan Note (Signed)
Chronic Taking iron daily Cbc, iron

## 2019-12-29 NOTE — Assessment & Plan Note (Signed)
Chronic GERD controlled Continue daily medication ? Nausea related to GERD  Continue omeprazole 20 mg daily

## 2019-12-29 NOTE — Telephone Encounter (Signed)
Pt last saw Dr Irish Lack 09/24/19, last labs 12/28/19 labs still pending, labs 09/24/19 Creat 1.0, age 86, weight 66.7kg, based on specified criteria pt is on appropriate dosage of Eliquis 5mg  BID.  Will refill rx.

## 2019-12-29 NOTE — Assessment & Plan Note (Signed)
Chronic ?Check vitamin d level ?

## 2019-12-29 NOTE — Assessment & Plan Note (Signed)
Chronic, intermittent Takes ativan 1/2 tab prn only - not daily Discussed risk of taking med - she does not want to give this up - it helps Will continue - take only as needed

## 2019-12-29 NOTE — Assessment & Plan Note (Signed)
Chronic BP well controlled Current regimen effective and well tolerated Continue current medications at current doses cmp  

## 2019-12-29 NOTE — Assessment & Plan Note (Signed)
Chronic  Clinically euthyroid Check tsh  Titrate med dose if needed  

## 2019-12-29 NOTE — Assessment & Plan Note (Signed)
Chronic On eliquis, Carvedilol Following with cards Cbc, cmp

## 2019-12-29 NOTE — Assessment & Plan Note (Signed)
Chronic Check a1c Low sugar / carb diet 

## 2019-12-30 ENCOUNTER — Telehealth: Payer: Self-pay

## 2019-12-30 LAB — CBC WITH DIFFERENTIAL/PLATELET
Absolute Monocytes: 552 cells/uL (ref 200–950)
Basophils Absolute: 42 cells/uL (ref 0–200)
Basophils Relative: 0.7 %
Eosinophils Absolute: 132 cells/uL (ref 15–500)
Eosinophils Relative: 2.2 %
HCT: 36.2 % (ref 35.0–45.0)
Hemoglobin: 11.6 g/dL — ABNORMAL LOW (ref 11.7–15.5)
Lymphs Abs: 1494 cells/uL (ref 850–3900)
MCH: 29.1 pg (ref 27.0–33.0)
MCHC: 32 g/dL (ref 32.0–36.0)
MCV: 91 fL (ref 80.0–100.0)
MPV: 9.6 fL (ref 7.5–12.5)
Monocytes Relative: 9.2 %
Neutro Abs: 3780 cells/uL (ref 1500–7800)
Neutrophils Relative %: 63 %
Platelets: 267 10*3/uL (ref 140–400)
RBC: 3.98 10*6/uL (ref 3.80–5.10)
RDW: 14.1 % (ref 11.0–15.0)
Total Lymphocyte: 24.9 %
WBC: 6 10*3/uL (ref 3.8–10.8)

## 2019-12-30 LAB — COMPREHENSIVE METABOLIC PANEL
AG Ratio: 1.9 (calc) (ref 1.0–2.5)
ALT: 9 U/L (ref 6–29)
AST: 14 U/L (ref 10–35)
Albumin: 4.2 g/dL (ref 3.6–5.1)
Alkaline phosphatase (APISO): 80 U/L (ref 37–153)
BUN/Creatinine Ratio: 15 (calc) (ref 6–22)
BUN: 16 mg/dL (ref 7–25)
CO2: 22 mmol/L (ref 20–32)
Calcium: 9.3 mg/dL (ref 8.6–10.4)
Chloride: 103 mmol/L (ref 98–110)
Creat: 1.04 mg/dL — ABNORMAL HIGH (ref 0.60–0.88)
Globulin: 2.2 g/dL (calc) (ref 1.9–3.7)
Glucose, Bld: 94 mg/dL (ref 65–99)
Potassium: 4.6 mmol/L (ref 3.5–5.3)
Sodium: 135 mmol/L (ref 135–146)
Total Bilirubin: 0.5 mg/dL (ref 0.2–1.2)
Total Protein: 6.4 g/dL (ref 6.1–8.1)

## 2019-12-30 LAB — HEMOGLOBIN A1C
Hgb A1c MFr Bld: 5.2 % of total Hgb (ref <5.7)
Mean Plasma Glucose: 103 (calc)
eAG (mmol/L): 5.7 (calc)

## 2019-12-30 LAB — FERRITIN: Ferritin: 37 ng/mL (ref 16–288)

## 2019-12-30 LAB — TRANSFERRIN: Transferrin: 281 mg/dL (ref 188–341)

## 2019-12-30 LAB — TSH: TSH: 1.1 mIU/L (ref 0.40–4.50)

## 2019-12-30 LAB — VITAMIN D 25 HYDROXY (VIT D DEFICIENCY, FRACTURES): Vit D, 25-Hydroxy: 17 ng/mL — ABNORMAL LOW (ref 30–100)

## 2019-12-30 NOTE — Telephone Encounter (Signed)
New message    The patient is asking for a copy of recent blood work mail to her home.

## 2019-12-31 NOTE — Telephone Encounter (Signed)
Mailed out today

## 2020-01-05 ENCOUNTER — Encounter: Payer: Self-pay | Admitting: Internal Medicine

## 2020-01-05 DIAGNOSIS — H903 Sensorineural hearing loss, bilateral: Secondary | ICD-10-CM | POA: Diagnosis not present

## 2020-01-19 ENCOUNTER — Other Ambulatory Visit: Payer: Self-pay | Admitting: Internal Medicine

## 2020-01-26 ENCOUNTER — Other Ambulatory Visit: Payer: Self-pay

## 2020-01-26 ENCOUNTER — Ambulatory Visit (INDEPENDENT_AMBULATORY_CARE_PROVIDER_SITE_OTHER): Payer: Medicare Other | Admitting: Obstetrics & Gynecology

## 2020-01-26 ENCOUNTER — Encounter: Payer: Self-pay | Admitting: Nurse Practitioner

## 2020-01-26 VITALS — BP 120/76

## 2020-01-26 DIAGNOSIS — N8110 Cystocele, unspecified: Secondary | ICD-10-CM

## 2020-01-26 DIAGNOSIS — N898 Other specified noninflammatory disorders of vagina: Secondary | ICD-10-CM

## 2020-01-26 DIAGNOSIS — Z4689 Encounter for fitting and adjustment of other specified devices: Secondary | ICD-10-CM

## 2020-01-26 DIAGNOSIS — N813 Complete uterovaginal prolapse: Secondary | ICD-10-CM | POA: Diagnosis not present

## 2020-01-26 NOTE — Progress Notes (Deleted)
   Acute Office Visit  Subjective:    Patient ID: [name], female    DOB: [birth year], 86 y.o.   MRN: [id number]   HPI 86 y.o. presents today for vaginal discharge. Pessary ring with support used for uterine prolapse.    Review of Systems     Objective:    Physical Exam  LMP  (LMP Unknown)  Wt Readings from Last 3 Encounters:  12/29/19 147 lb (66.7 kg)  12/03/19 151 lb (68.5 kg)  09/24/19 152 lb 12.8 oz (69.3 kg)        Assessment & Plan:   Problem List Items Addressed This Visit    None          Tamela Gammon [facility], 9:52 AM 01/26/2020

## 2020-01-26 NOTE — Progress Notes (Signed)
° ° °  [name] [birth year] [id number]        86 y.o.  [id number]   RP: Pinkish vaginal discharge 2 days ago, using a Pessary  HPI: Very mild pinkish tint in vaginal discharge 2 days ago.  No abdominal pelvic pain and no vaginal pain.  Doing well with Milex pessary ring #5 with support.  No urinary tract infection symptoms.  No fever.   OB History  Gravida Para Term Preterm AB Living  4 2     2 2   SAB TAB Ectopic Multiple Live Births  2            # Outcome Date GA Lbr Len/2nd Weight Sex Delivery Anes PTL Lv  4 SAB           3 SAB           2 Para           1 Para             Past medical history,surgical history, problem list, medications, allergies, family history and social history were all reviewed and documented in the EPIC chart.   Directed ROS with pertinent positives and negatives documented in the history of present illness/assessment and plan.  Exam:  Vitals:   01/26/20 0959  BP: 120/76   General appearance:  Normal  Abdomen: Normal  Gynecologic exam: Vulva normal.  Pessary removed.  Yellowish discharge present, no blood.  Pessary cleaned with antibacterial soap and rinsed well.  Speculum:  Cervix and vagina normal.  No erosion or ulcer.  Pessary put back in place.  Good fit.   Assessment/Plan:  86 y.o. [id number]   1. Pessary maintenance Possibly mild pinkish discharge due to vaginal atrophy with irritation.  No blood seen in the vagina or at the vulva.  Cervix and vagina normal without erosion or ulceration.  Pessary maintenance done with cleaning of the pessary and reinsertion.  Patient reassured.  We will follow-up for pessary maintenance in 4 months.   Princess Bruins MD, 10:34 AM 01/26/2020

## 2020-02-18 ENCOUNTER — Telehealth: Payer: Self-pay | Admitting: Internal Medicine

## 2020-02-18 NOTE — Telephone Encounter (Signed)
Yes, ok to take any otc iron

## 2020-02-18 NOTE — Telephone Encounter (Signed)
    Patient wants to know if she can take "regular OTC iron pills". Patient states she has not been able to find quick release tablets. She is unsure what to purchase

## 2020-02-18 NOTE — Telephone Encounter (Signed)
Notified pt w/MD response.../lmb 

## 2020-02-26 DIAGNOSIS — Z23 Encounter for immunization: Secondary | ICD-10-CM | POA: Diagnosis not present

## 2020-02-29 ENCOUNTER — Ambulatory Visit: Payer: Medicare Other | Admitting: Obstetrics & Gynecology

## 2020-03-01 ENCOUNTER — Telehealth: Payer: Self-pay | Admitting: Internal Medicine

## 2020-03-01 NOTE — Progress Notes (Signed)
  Chronic Care Management   Outreach Note  03/01/2020 Name: [name] MRN: [id number] DOB: 1928-02-09  Referred by: Binnie Rail, MD Reason for referral : No chief complaint on file.   An unsuccessful telephone outreach was attempted today. The patient was referred to the pharmacist for assistance with care management and care coordination.   Follow Up Plan:   Carley Perdue [employer] Scheduler

## 2020-03-07 ENCOUNTER — Telehealth: Payer: Self-pay | Admitting: Internal Medicine

## 2020-03-07 NOTE — Progress Notes (Signed)
°  Chronic Care Management   Outreach Note  03/07/2020 Name: MAYLIN FREEBURG MRN: [id number] DOB: [birth year]  Referred by: Binnie Rail, MD Reason for referral : No chief complaint on file.   A second unsuccessful telephone outreach was attempted today. The patient was referred to pharmacist for assistance with care management and care coordination.  Follow Up Plan:   Carley Perdue [employer] Scheduler

## 2020-03-09 ENCOUNTER — Ambulatory Visit: Payer: Medicare Other

## 2020-03-14 ENCOUNTER — Telehealth: Payer: Self-pay | Admitting: Internal Medicine

## 2020-03-14 NOTE — Progress Notes (Signed)
  Chronic Care Management   Outreach Note  03/14/2020 Name: DAESHA INSCO MRN: [id number] DOB: 1928-04-01  Referred by: Binnie Rail, MD Reason for referral : No chief complaint on file.   Third unsuccessful telephone outreach was attempted today. The patient was referred to the pharmacist for assistance with care management and care coordination.   Follow Up Plan:   Carley Perdue [employer] Scheduler

## 2020-03-21 DIAGNOSIS — M1711 Unilateral primary osteoarthritis, right knee: Secondary | ICD-10-CM | POA: Diagnosis not present

## 2020-03-25 DIAGNOSIS — L570 Actinic keratosis: Secondary | ICD-10-CM | POA: Diagnosis not present

## 2020-03-25 DIAGNOSIS — L820 Inflamed seborrheic keratosis: Secondary | ICD-10-CM | POA: Diagnosis not present

## 2020-03-25 DIAGNOSIS — Z85828 Personal history of other malignant neoplasm of skin: Secondary | ICD-10-CM | POA: Diagnosis not present

## 2020-03-25 DIAGNOSIS — D1801 Hemangioma of skin and subcutaneous tissue: Secondary | ICD-10-CM | POA: Diagnosis not present

## 2020-03-25 DIAGNOSIS — L821 Other seborrheic keratosis: Secondary | ICD-10-CM | POA: Diagnosis not present

## 2020-03-31 DIAGNOSIS — Z23 Encounter for immunization: Secondary | ICD-10-CM | POA: Diagnosis not present

## 2020-04-11 DIAGNOSIS — M1711 Unilateral primary osteoarthritis, right knee: Secondary | ICD-10-CM | POA: Diagnosis not present

## 2020-04-12 ENCOUNTER — Other Ambulatory Visit: Payer: Self-pay | Admitting: Internal Medicine

## 2020-04-14 ENCOUNTER — Telehealth: Payer: Self-pay | Admitting: Emergency Medicine

## 2020-04-14 NOTE — Telephone Encounter (Signed)
I am ok w/ seeing her - should be appt - may need to consider tramadol if tylenol is not effective.

## 2020-04-14 NOTE — Telephone Encounter (Signed)
Pts daughter called [facility] 04/13/2020 at 2:17pm. Caller stated that the pt was seen by orthopedic surgeon on Monday and drew fluid off patients knee and injected some type of anti-inflammatory but she is in a lot of pain. She is on Eliquis and doesn't know what she can take. [facility] recommended being seen by PCP in at least 3 days. Pt complied and understood. Called pt this morning to schedule appt and she stated she would call office later when daughter got off work to schedule.

## 2020-04-15 MED ORDER — TRAMADOL HCL 50 MG PO TABS: 50.0000 mg | ORAL_TABLET | Freq: Three times a day (TID) | ORAL | 0 refills | Status: DC | PRN

## 2020-04-15 NOTE — Addendum Note (Signed)
Addended by: Binnie Rail on: 04/15/2020 06:26 PM   Modules accepted: Orders

## 2020-04-15 NOTE — Telephone Encounter (Signed)
Sent to pharmacy 

## 2020-04-15 NOTE — Telephone Encounter (Signed)
   Patients daughter Opal Sidles calling for advice  taking Tylenol. She wants to discuss the dosage. She doesn't want to give her mother too much, but needs help controlling the pain Declined Tramadol, stating  her mother has gotten  nauseous in the past from taking Opal Sidles phone [phone]

## 2020-04-15 NOTE — Telephone Encounter (Signed)
Maximum dosage of Tylenol for 1 day is 3000 mg.

## 2020-04-18 NOTE — Progress Notes (Signed)
Subjective:    Patient ID: [name], female    DOB: 04/25/1928, 86 y.o.   MRN: [id number]  HPI The patient is here for an acute visit.   Subacute knee pain - she saw orthopedics last month for knee pain.  She denies injury - her right knee just hurt severely and it was swollen.  She was given a steroid injection.  There was no improvement.  Earlier this month she had fluid pulled off the knee and injected an anti-inflammatory.  She was still in a lot of pain.  I prescribed tramadol last week. She took the tramadol and it made her feel woozy, off balanced. She is taking tylenol, but only one pill twice a day.    She has tried icing the joint, voltaren gel, biofreeze and salon pas. None of these help.  Her pain is severe.     She is having increased nausea.  She thought it was from the pain.    Medications and allergies reviewed with patient and updated if appropriate.  Patient Active Problem List   Diagnosis Date Noted  . Female bladder prolapse 11/15/2017  . Decreased hearing of left ear 11/15/2017  . Iron deficiency anemia 09/10/2017  . Shortness of breath 09/10/2017  . Hyperglycemia 05/10/2017  . Persistent atrial fibrillation ([place]) 08/20/2016  . GERD (gastroesophageal reflux disease) 02/20/2016  . Left hip pain 01/20/2016  . Peripheral edema 01/20/2016  . Systolic murmur 96/78/9381  . Anxiety 07/18/2013  . Nodule of left lung 07/18/2013  . Apical variant hypertrophic cardiomyopathy ([place]) 08/23/2009  . Vitamin D deficiency 01/21/2009  . Chronic kidney disease (CKD), stage III (moderate) ([place]) 01/21/2009  . MYCOSIS FUNGOIDES 05/21/2008  . DIVERTICULOSIS, COLON 05/21/2008  . OSTEOARTHRITIS 06/02/2007  . Hypothyroidism 04/28/2007  . HYPERLIPIDEMIA 04/28/2007  . Essential hypertension 04/28/2007  . Osteoporosis 04/28/2007  . FIBROMYALGIA 11/14/2006    Current Outpatient Medications on File Prior to Visit  Medication Sig Dispense Refill  . beta carotene  w/minerals (OCUVITE) tablet Take 1 tablet by mouth daily.    . carvedilol (COREG) 3.125 MG tablet TAKE 1 TABLET BY MOUTH TWICE A DAY 180 tablet 3  . ELIQUIS 5 MG TABS tablet TAKE 1 TABLET BY MOUTH TWICE A DAY 60 tablet 5  . ferrous sulfate 325 (65 FE) MG EC tablet Take 325 mg by mouth daily.    . furosemide (LASIX) 20 MG tablet Take 1 tablet (20 mg total) by mouth daily as needed. 30 tablet 11  . levothyroxine (SYNTHROID) 75 MCG tablet TAKE 1 TABLET BY MOUTH DAILY EXCEPT TAKE 1/2 TABLET ON TUESDAYS AND SATURDAY 72 tablet 0  . lisinopril (ZESTRIL) 40 MG tablet Take 1 tablet (40 mg total) by mouth daily. 90 tablet 3   No current facility-administered medications on file prior to visit.    Past Medical History:  Diagnosis Date  . Adrenal myelolipoma   . Apical variant hypertrophic cardiomyopathy ([place])    Echo 4/18: Normal LV size with marked apical hypertrophy. LVEF 60-65%. No WMA. Mild MR. Moderate left atrial enlargement. Normal RV size and function. Mild right atrial enlargemen  . Chronic kidney disease   . DIVERTICULOSIS, COLON   . Dysmetabolic syndrome X   . FIBROMYALGIA   . Gastric ulcer due to Helicobacter pylori   . Hiatal hernia   . Hx of cataract surgery 01/08/2016  . HYPERLIPIDEMIA   . Hyperplastic colon polyp   . HYPERTENSION, ESSENTIAL NOS   . Hyponatremia   . Hypothyroidism   .  MYCOSIS FUNGOIDES   . Nodule of left lung   . Normocytic anemia   . Olecranon fracture 01/07/2016  . OSTEOARTHRITIS   . OSTEOPOROSIS   . Persistent atrial fibrillation ([place])    s/p DCCV 2018 // Apixaban for anticoag  . Renal angiomyolipoma   . Unspecified vitamin D deficiency     Past Surgical History:  Procedure Laterality Date  . CARDIOVERSION N/A 01/03/2017   Procedure: CARDIOVERSION;  Surgeon: Josue Hector, MD;  Location: [facility] ENDOSCOPY;  Service: Cardiovascular;  Laterality: N/A;  . colonoscopy with polypectomy  07/25/2007   Dr Olevia Perches, Recheck in 7 years for 2009  . G 4 P 2    . I & D  EXTREMITY Right 01/08/2016   Procedure: IRRIGATION AND DEBRIDEMENT OF OLECRANON FRACTURE;  Surgeon: Renette Butters, MD;  Location: [place];  Service: Orthopedics;  Laterality: Right;  . ORIF ELBOW FRACTURE Right 01/08/2016   Procedure: OPEN REDUCTION INTERNAL FIXATION (ORIF) ELBOW/OLECRANON FRACTURE, NEUROLYSIS;  Surgeon: Renette Butters, MD;  Location: [place];  Service: Orthopedics;  Laterality: Right;    Social History   Socioeconomic History  . Marital status: Widowed    Spouse name: Not on file  . Number of children: 2  . Years of education: Not on file  . Highest education level: Not on file  Occupational History  . Occupation: Retired    Fish farm manager: RETIRED  Tobacco Use  . Smoking status: Former Smoker    Packs/day: 0.50    Years: 14.00    Pack years: 7.00    Quit date: 1956    Years since quitting: 65.9  . Smokeless tobacco: Never Used  . Tobacco comment: smoked 1951-1965 , up to 1/2 ppd  Vaping Use  . Vaping Use: Never used  Substance and Sexual Activity  . Alcohol use: No    Alcohol/week: 0.0 standard drinks  . Drug use: No  . Sexual activity: Never  Other Topics Concern  . Not on file  Social History Narrative   Widowed.  Lives alone.  Ambulates without assistance.  Steady on feet.   Social Determinants of Health   Financial Resource Strain:   . Difficulty of Paying Living Expenses: Not on file  Food Insecurity:   . Worried About Charity fundraiser in the Last Year: Not on file  . Ran Out of Food in the Last Year: Not on file  Transportation Needs:   . Lack of Transportation (Medical): Not on file  . Lack of Transportation (Non-Medical): Not on file  Physical Activity:   . Days of Exercise per Week: Not on file  . Minutes of Exercise per Session: Not on file  Stress:   . Feeling of Stress : Not on file  Social Connections:   . Frequency of Communication with Friends and Family: Not on file  . Frequency of Social Gatherings with Friends and Family: Not on  file  . Attends Religious Services: Not on file  . Active Member of Clubs or Organizations: Not on file  . Attends Archivist Meetings: Not on file  . Marital Status: Not on file    Family History  Problem Relation Age of Onset  . Diabetes Mother   . Heart attack Maternal Uncle 15       his 2 sons had MI in 36s  . Hypertension Maternal Grandmother   . Stroke Maternal Grandmother 69  . Cancer Neg Hx   . Asthma Neg Hx   . COPD Neg  Hx     Review of Systems  Constitutional: Negative for fever.  Musculoskeletal: Positive for arthralgias (right knee) and joint swelling (right knee).  Skin: Negative for color change and rash.       Objective:   Vitals:   04/19/20 1455  BP: 132/68  Pulse: 93  Temp: 98 F (36.7 C)  SpO2: 98%   BP Readings from Last 3 Encounters:  04/19/20 132/68  01/26/20 120/76  12/29/19 (!) 134/76   Wt Readings from Last 3 Encounters:  04/19/20 147 lb (66.7 kg)  12/29/19 147 lb (66.7 kg)  12/03/19 151 lb (68.5 kg)   Body mass index is 30.72 kg/m.   Physical Exam Constitutional:      General: She is not in acute distress.    Appearance: Normal appearance. She is not ill-appearing.  HENT:     Head: Normocephalic and atraumatic.  Musculoskeletal:     Right lower leg: No edema.     Left lower leg: No edema.     Comments: Right knee with mild effusion, mild warmth, mild tenderness medial aspect, slightly dec ROM.  No calf tenderness  Skin:    General: Skin is warm and dry.     Findings: No erythema.  Neurological:     Mental Status: She is alert.            Assessment & Plan:    See Problem List for Assessment and Plan of chronic medical problems.    This visit occurred during the SARS-CoV-2 public health emergency.  Safety protocols were in place, including screening questions prior to the visit, additional usage of staff PPE, and extensive cleaning of exam room while observing appropriate contact time as indicated for  disinfecting solutions.

## 2020-04-19 ENCOUNTER — Other Ambulatory Visit: Payer: Self-pay

## 2020-04-19 ENCOUNTER — Encounter: Payer: Self-pay | Admitting: Internal Medicine

## 2020-04-19 ENCOUNTER — Ambulatory Visit (INDEPENDENT_AMBULATORY_CARE_PROVIDER_SITE_OTHER): Payer: Medicare Other | Admitting: Internal Medicine

## 2020-04-19 VITALS — BP 132/68 | HR 93 | Temp 98.0°F | Ht <= 58 in | Wt 147.0 lb

## 2020-04-19 DIAGNOSIS — M25561 Pain in right knee: Secondary | ICD-10-CM | POA: Diagnosis not present

## 2020-04-19 DIAGNOSIS — K219 Gastro-esophageal reflux disease without esophagitis: Secondary | ICD-10-CM | POA: Diagnosis not present

## 2020-04-19 MED ORDER — OMEPRAZOLE 40 MG PO CPDR
40.0000 mg | DELAYED_RELEASE_CAPSULE | Freq: Every day | ORAL | 3 refills | Status: DC
Start: 2020-04-19 — End: 2021-04-12

## 2020-04-19 MED ORDER — HYDROCODONE-ACETAMINOPHEN 5-325 MG PO TABS: 1.0000 | ORAL_TABLET | Freq: Three times a day (TID) | ORAL | 0 refills | Status: DC | PRN

## 2020-04-19 MED ORDER — LORAZEPAM 0.5 MG PO TABS
ORAL_TABLET | ORAL | 1 refills | Status: DC
Start: 2020-04-19 — End: 2020-09-12

## 2020-04-19 NOTE — Assessment & Plan Note (Signed)
Acute Start last month - no injury Associate with swelling, severe pain Likely flare of OA Saw ortho x 2 - steroid injection, then drained effusion/anti-inflammatory injection - no improvement Will go to [facility] for further eval Advised increasing tylenol to 2 tabs BID Continue icing knee 3-4 times a day Pain is severe - unable to take nsaids -- did not tolerate tramadol.  Looks like she took vicodin in the past - ? tolerated well vicodin 5-325 mg once daily prn - her daughter will make sure she is there when she takes the medication to see how she does with it.  Reviewed possible side effects

## 2020-04-19 NOTE — Assessment & Plan Note (Signed)
Chronic Nausea likely from GERD Increase omeprazole from 20 mg daily to 40 mg daily

## 2020-04-19 NOTE — Patient Instructions (Addendum)
Make an appointment with [facility].   Take two tylenol twice daily.    Try the vicodin daily as needed for severe pain.   Increase the omeprazole to 40 mg daily.      Keep icing the knee three times a day.

## 2020-04-25 ENCOUNTER — Telehealth: Payer: Self-pay | Admitting: *Deleted

## 2020-04-25 NOTE — Telephone Encounter (Signed)
Patient called c/o vaginal odor and beige discharge, no itching, reports she had this same issues in past you prescribed a cream. On 03/10/19 she had BV and you prescribed "clindamycin vaginal cream x3 nights". Patient is not able to come in having problems with her knee, and not able to walk good. Please advise

## 2020-04-26 DIAGNOSIS — M1711 Unilateral primary osteoarthritis, right knee: Secondary | ICD-10-CM | POA: Diagnosis not present

## 2020-04-26 MED ORDER — CLINDAMYCIN PHOSPHATE 2 % VA CREA: 1.0000 | TOPICAL_CREAM | Freq: Every day | VAGINAL | 0 refills | Status: DC

## 2020-04-26 NOTE — Telephone Encounter (Signed)
Yes, please prescribe the Clindamycin cream vaginally x 3 days.

## 2020-04-26 NOTE — Telephone Encounter (Signed)
Left detailed message on home # Rx sent.

## 2020-05-06 ENCOUNTER — Other Ambulatory Visit: Payer: Self-pay | Admitting: Internal Medicine

## 2020-05-25 ENCOUNTER — Other Ambulatory Visit: Payer: Self-pay

## 2020-05-25 ENCOUNTER — Encounter: Payer: Self-pay | Admitting: Obstetrics & Gynecology

## 2020-05-25 ENCOUNTER — Ambulatory Visit (INDEPENDENT_AMBULATORY_CARE_PROVIDER_SITE_OTHER): Payer: Medicare Other | Admitting: Obstetrics & Gynecology

## 2020-05-25 VITALS — BP 134/70

## 2020-05-25 DIAGNOSIS — N760 Acute vaginitis: Secondary | ICD-10-CM

## 2020-05-25 DIAGNOSIS — B9689 Other specified bacterial agents as the cause of diseases classified elsewhere: Secondary | ICD-10-CM

## 2020-05-25 DIAGNOSIS — N898 Other specified noninflammatory disorders of vagina: Secondary | ICD-10-CM

## 2020-05-25 DIAGNOSIS — Z4689 Encounter for fitting and adjustment of other specified devices: Secondary | ICD-10-CM

## 2020-05-25 MED ORDER — CLINDAMYCIN PHOSPHATE 2 % VA CREA: 1.0000 | TOPICAL_CREAM | Freq: Every day | VAGINAL | 1 refills | Status: AC

## 2020-05-25 NOTE — Progress Notes (Signed)
    [name] 28-Jul-1927 [id number]        86 y.o.  [id number]  Celebrating Christmas with her Children, Grand-children and 2 Great-grand-children :-)  RP:  Pessary maintenance  HPI:  Mild vaginal d/c with odor.  No bleeding.  Doing well with Milex pessary ring #5 with support.  No urinary tract infection symptoms.  No fever.    OB History  Gravida Para Term Preterm AB Living  4 2     2 2   SAB IAB Ectopic Multiple Live Births  2            # Outcome Date GA Lbr Len/2nd Weight Sex Delivery Anes PTL Lv  4 SAB           3 SAB           2 Para           1 Para             Past medical history,surgical history, problem list, medications, allergies, family history and social history were all reviewed and documented in the EPIC chart.   Directed ROS with pertinent positives and negatives documented in the history of present illness/assessment and plan.  Exam:  Vitals:   05/25/20 1101  BP: 134/70   General appearance:  Normal  Abdomen: Normal  Gynecologic exam: Vulva normal.  Pessary removed and cleaned.  Vaginal mucosa intact.  Mild yellowish discharge.   Assessment/Plan:  86 y.o. [id number]   1. Pessary maintenance Well with Milex ring #  2. Vaginal odor Clinical Bacterial Vaginosis.  Will treat with Clindamycin 2% vaginal cream.  No CI.  Usage known.  Prescription sent to pharmacy.  Other orders - clindamycin (CLEOCIN) 2 % vaginal cream; Place 1 Applicatorful vaginally at bedtime for 7 days.  Princess Bruins MD, 11:35 AM 05/25/2020

## 2020-06-28 ENCOUNTER — Other Ambulatory Visit: Payer: Self-pay | Admitting: Internal Medicine

## 2020-06-28 ENCOUNTER — Other Ambulatory Visit: Payer: Self-pay | Admitting: Interventional Cardiology

## 2020-06-28 NOTE — Telephone Encounter (Signed)
Prescription refill request for Eliquis received.  Indication: Afib Last office visit: 09/24/2019, [place] Scr: 1.04, 12/29/2019 Age: 87 Weight: 66.7 kg   Prescription refill sent.

## 2020-06-30 ENCOUNTER — Other Ambulatory Visit: Payer: Self-pay

## 2020-06-30 NOTE — Patient Instructions (Addendum)
    Blood work was ordered.      Medications changes include :   none     Please followup in 6 months  

## 2020-06-30 NOTE — Progress Notes (Signed)
Subjective:    Patient ID: [name], female    DOB: 05/25/1928, 87 y.o.   MRN: [id number]  HPI The patient is here for follow up of their chronic medical problems, including Afib, htn, hypothyroidism, GERD, iron def anemia, anxiety, hyperglycemia, stage 3 CKD, vitamin d def  She takes lasix about 2 / week.  Her leg edema has been well controlled.  Typically only the right lower extremity gets swollen-she states this is from when she injured her right knee.  Her left leg is typically not swollen.  She wishes she had more energy.   Medications and allergies reviewed with patient and updated if appropriate.  Patient Active Problem List   Diagnosis Date Noted  . Aortic atherosclerosis (HCC) 07/01/2020  . Female bladder prolapse 11/15/2017  . Decreased hearing of left ear 11/15/2017  . Iron deficiency anemia 09/10/2017  . Shortness of breath 09/10/2017  . Hyperglycemia 05/10/2017  . Persistent atrial fibrillation (H[facility]) 08/20/2016  . GERD (gastroesophageal reflux disease) 02/20/2016  . Right knee pain 01/20/2016  . Left hip pain 01/20/2016  . Peripheral edema 01/20/2016  . Systolic murmur 01/08/2016  . Anxiety 07/18/2013  . Nodule of left lung 07/18/2013  . Apical variant hypertrophic cardiomyopathy (HCC) 08/23/2009  . Vitamin D deficiency 01/21/2009  . Chronic kidney disease (CKD), stage III (moderate) ([facility]) 01/21/2009  . MYCOSIS FUNGOIDES 05/21/2008  . DIVERTICULOSIS, COLON 05/21/2008  . OSTEOARTHRITIS 06/02/2007  . Hypothyroidism 04/28/2007  . HYPERLIPIDEMIA 04/28/2007  . Essential hypertension 04/28/2007  . Osteoporosis 04/28/2007  . FIBROMYALGIA 11/14/2006    Current Outpatient Medications on File Prior to Visit  Medication Sig Dispense Refill  . beta carotene w/minerals (OCUVITE) tablet Take 1 tablet by mouth daily.    . carvedilol (COREG) 3.125 MG tablet TAKE 1 TABLET BY MOUTH TWICE A DAY 180 tablet 3  . ELIQUIS 5 MG TABS tablet TAKE 1 TABLET BY MOUTH TWICE  A DAY 60 tablet 5  . furosemide (LASIX) 20 MG tablet Take 1 tablet (20 mg total) by mouth daily as needed. 30 tablet 11  . levothyroxine (SYNTHROID) 75 MCG tablet TAKE 1 TABLET BY MOUTH DAILY EXCEPT TAKE 1/2 TABLET ON TUESDAYS AND SATURDAY 72 tablet 0  . lisinopril (ZESTRIL) 40 MG tablet TAKE 1 TABLET BY MOUTH EVERY DAY 90 tablet 0  . LORazepam (ATIVAN) 0.5 MG tablet TAKE 1/2 TABLET BY MOUTH DAILY AS NEEDED FOR ANXIETY 30 tablet 1  . omeprazole (PRILOSEC) 40 MG capsule Take 1 capsule (40 mg total) by mouth daily. 90 capsule 3   No current facility-administered medications on file prior to visit.    Past Medical History:  Diagnosis Date  . Adrenal myelolipoma   . Apical variant hypertrophic cardiomyopathy (HCC)    Echo 4/18: Normal LV size with marked apical hypertrophy. LVEF 60-65%. No WMA. Mild MR. Moderate left atrial enlargement. Normal RV size and function. Mild right atrial enlargemen  . Chronic kidney disease   . DIVERTICULOSIS, COLON   . Dysmetabolic syndrome X   . FIBROMYALGIA   . Gastric ulcer due to Helicobacter pylori   . Hiatal hernia   . Hx of cataract surgery 01/08/2016  . HYPERLIPIDEMIA   . Hyperplastic colon polyp   . HYPERTENSION, ESSENTIAL NOS   . Hyponatremia   . Hypothyroidism   . MYCOSIS FUNGOIDES   . Nodule of left lung   . Normocytic anemia   . Olecranon fracture 01/07/2016  . OSTEOARTHRITIS   . OSTEOPOROSIS   . Persistent atrial  fibrillation [facility])    s/p DCCV 2018 // Apixaban for anticoag  . Renal angiomyolipoma   . Unspecified vitamin D deficiency     Past Surgical History:  Procedure Laterality Date  . CARDIOVERSION N/A 01/03/2017   Procedure: CARDIOVERSION;  Surgeon: Josue Hector, MD;  Location: [facility] ENDOSCOPY;  Service: Cardiovascular;  Laterality: N/A;  . colonoscopy with polypectomy  07/25/2007   Dr Olevia Perches, Recheck in 7 years for 2009  . G 4 P 2    . I & D EXTREMITY Right 01/08/2016   Procedure: IRRIGATION AND DEBRIDEMENT OF OLECRANON FRACTURE;   Surgeon: Renette Butters, MD;  Location: [place];  Service: Orthopedics;  Laterality: Right;  . ORIF ELBOW FRACTURE Right 01/08/2016   Procedure: OPEN REDUCTION INTERNAL FIXATION (ORIF) ELBOW/OLECRANON FRACTURE, NEUROLYSIS;  Surgeon: Renette Butters, MD;  Location: [place];  Service: Orthopedics;  Laterality: Right;    Social History   Socioeconomic History  . Marital status: Widowed    Spouse name: Not on file  . Number of children: 2  . Years of education: Not on file  . Highest education level: Not on file  Occupational History  . Occupation: Retired    Fish farm manager: RETIRED  Tobacco Use  . Smoking status: Former Smoker    Packs/day: 0.50    Years: 14.00    Pack years: 7.00    Quit date: 1956    Years since quitting: 66.1  . Smokeless tobacco: Never Used  . Tobacco comment: smoked 1951-1965 , up to 1/2 ppd  Vaping Use  . Vaping Use: Never used  Substance and Sexual Activity  . Alcohol use: No    Alcohol/week: 0.0 standard drinks  . Drug use: No  . Sexual activity: Never  Other Topics Concern  . Not on file  Social History Narrative   Widowed.  Lives alone.  Ambulates without assistance.  Steady on feet.   Social Determinants of Health   Financial Resource Strain: Not on file  Food Insecurity: Not on file  Transportation Needs: Not on file  Physical Activity: Not on file  Stress: Not on file  Social Connections: Not on file    Family History  Problem Relation Age of Onset  . Diabetes Mother   . Heart attack Maternal Uncle 65       his 2 sons had MI in 26s  . Hypertension Maternal Grandmother   . Stroke Maternal Grandmother 69  . Cancer Neg Hx   . Asthma Neg Hx   . COPD Neg Hx     Review of Systems  Constitutional: Negative for appetite change and fever.  Respiratory: Positive for shortness of breath (once in while). Negative for cough and wheezing.   Cardiovascular: Positive for leg swelling (RLE > LLE). Negative for chest pain and palpitations.  Neurological:  Negative for light-headedness and headaches.       Objective:   Vitals:   07/01/20 1107  BP: 130/78  Pulse: 82  Temp: 98.2 F (36.8 C)  SpO2: 97%   BP Readings from Last 3 Encounters:  07/01/20 130/78  05/25/20 134/70  04/19/20 132/68   Wt Readings from Last 3 Encounters:  07/01/20 144 lb (65.3 kg)  04/19/20 147 lb (66.7 kg)  12/29/19 147 lb (66.7 kg)   Body mass index is 30.1 kg/m.   Physical Exam    Constitutional: Appears well-developed and well-nourished. No distress.  HENT:  Head: Normocephalic and atraumatic.  Neck: Neck supple. No tracheal deviation present. No thyromegaly present.  No cervical lymphadenopathy Cardiovascular: Normal rate, irregular rhythm and normal heart sounds.   No murmur heard. No carotid bruit .  Mild RLE edema, no LLE edema Pulmonary/Chest: Effort normal and breath sounds normal. No respiratory distress. No has no wheezes. No rales.  Skin: Skin is warm and dry. Not diaphoretic.  Psychiatric: Normal mood and affect. Behavior is normal.      Assessment & Plan:    See Problem List for Assessment and Plan of chronic medical problems.    This visit occurred during the SARS-CoV-2 public health emergency.  Safety protocols were in place, including screening questions prior to the visit, additional usage of staff PPE, and extensive cleaning of exam room while observing appropriate contact time as indicated for disinfecting solutions.

## 2020-07-01 ENCOUNTER — Encounter: Payer: Self-pay | Admitting: Internal Medicine

## 2020-07-01 ENCOUNTER — Ambulatory Visit (INDEPENDENT_AMBULATORY_CARE_PROVIDER_SITE_OTHER): Payer: Medicare Other | Admitting: Internal Medicine

## 2020-07-01 VITALS — BP 130/78 | HR 82 | Temp 98.2°F | Wt 144.0 lb

## 2020-07-01 DIAGNOSIS — N1831 Chronic kidney disease, stage 3a: Secondary | ICD-10-CM | POA: Diagnosis not present

## 2020-07-01 DIAGNOSIS — I10 Essential (primary) hypertension: Secondary | ICD-10-CM

## 2020-07-01 DIAGNOSIS — K219 Gastro-esophageal reflux disease without esophagitis: Secondary | ICD-10-CM

## 2020-07-01 DIAGNOSIS — D509 Iron deficiency anemia, unspecified: Secondary | ICD-10-CM

## 2020-07-01 DIAGNOSIS — F419 Anxiety disorder, unspecified: Secondary | ICD-10-CM

## 2020-07-01 DIAGNOSIS — I700 Atherosclerosis of aorta: Secondary | ICD-10-CM | POA: Diagnosis not present

## 2020-07-01 DIAGNOSIS — E559 Vitamin D deficiency, unspecified: Secondary | ICD-10-CM

## 2020-07-01 DIAGNOSIS — I4819 Other persistent atrial fibrillation: Secondary | ICD-10-CM

## 2020-07-01 DIAGNOSIS — E038 Other specified hypothyroidism: Secondary | ICD-10-CM | POA: Diagnosis not present

## 2020-07-01 DIAGNOSIS — R739 Hyperglycemia, unspecified: Secondary | ICD-10-CM

## 2020-07-01 LAB — COMPREHENSIVE METABOLIC PANEL
ALT: 9 U/L (ref 0–35)
AST: 14 U/L (ref 0–37)
Albumin: 4.3 g/dL (ref 3.5–5.2)
Alkaline Phosphatase: 77 U/L (ref 39–117)
BUN: 24 mg/dL — ABNORMAL HIGH (ref 6–23)
CO2: 27 mEq/L (ref 19–32)
Calcium: 10.1 mg/dL (ref 8.4–10.5)
Chloride: 105 mEq/L (ref 96–112)
Creatinine, Ser: 1.28 mg/dL — ABNORMAL HIGH (ref 0.40–1.20)
GFR: 36.91 mL/min — ABNORMAL LOW (ref >60.00)
Glucose, Bld: 99 mg/dL (ref 70–99)
Potassium: 4.9 mEq/L (ref 3.5–5.1)
Sodium: 140 mEq/L (ref 135–145)
Total Bilirubin: 0.6 mg/dL (ref 0.2–1.2)
Total Protein: 7.2 g/dL (ref 6.0–8.3)

## 2020-07-01 LAB — CBC WITH DIFFERENTIAL/PLATELET
Basophils Absolute: 0 10*3/uL (ref 0.0–0.1)
Basophils Relative: 0.6 % (ref 0.0–3.0)
Eosinophils Absolute: 0.1 10*3/uL (ref 0.0–0.7)
Eosinophils Relative: 1 % (ref 0.0–5.0)
HCT: 37.6 % (ref 36.0–46.0)
Hemoglobin: 12.3 g/dL (ref 12.0–15.0)
Lymphocytes Relative: 24.7 % (ref 12.0–46.0)
Lymphs Abs: 1.7 10*3/uL (ref 0.7–4.0)
MCHC: 32.7 g/dL (ref 30.0–36.0)
MCV: 89.9 fl (ref 78.0–100.0)
Monocytes Absolute: 0.6 10*3/uL (ref 0.1–1.0)
Monocytes Relative: 8 % (ref 3.0–12.0)
Neutro Abs: 4.6 10*3/uL (ref 1.4–7.7)
Neutrophils Relative %: 65.7 % (ref 43.0–77.0)
Platelets: 258 10*3/uL (ref 150.0–400.0)
RBC: 4.19 Mil/uL (ref 3.87–5.11)
RDW: 13.8 % (ref 11.5–15.5)
WBC: 6.9 10*3/uL (ref 4.0–10.5)

## 2020-07-01 LAB — VITAMIN D 25 HYDROXY (VIT D DEFICIENCY, FRACTURES): VITD: 49.91 ng/mL (ref 30.00–100.00)

## 2020-07-01 LAB — TSH: TSH: 0.99 u[IU]/mL (ref 0.35–4.50)

## 2020-07-01 MED ORDER — CHOLECALCIFEROL 50 MCG (2000 UT) PO CAPS: 1.0000 | ORAL_CAPSULE | Freq: Every day | ORAL | Status: AC

## 2020-07-01 MED ORDER — VITAMIN B-12 1000 MCG PO TABS: 1000.0000 ug | ORAL_TABLET | Freq: Every day | ORAL | Status: AC

## 2020-07-01 NOTE — Assessment & Plan Note (Signed)
Chronic Takes lorazepam 0.5 mg half tab daily as needed-does not take often We will continue

## 2020-07-01 NOTE — Assessment & Plan Note (Signed)
Chronic Sugars have been in the normal range CMP

## 2020-07-01 NOTE — Assessment & Plan Note (Signed)
Chronic CMP 

## 2020-07-01 NOTE — Assessment & Plan Note (Signed)
Chronic Well controlled Continue carvedilol 3.125 mg twice daily, lisinopril 40 mg daily CMP

## 2020-07-01 NOTE — Assessment & Plan Note (Signed)
Chronic Taking iron daily Check CBC, iron panel

## 2020-07-01 NOTE — Assessment & Plan Note (Signed)
Chronic GERD controlled Continue omeprazole 40 mg daily 

## 2020-07-01 NOTE — Assessment & Plan Note (Signed)
Chronic Persistent A. fib Asymptomatic Following with cardiology Continue Eliquis 5 mg twice daily, carvedilol 3.125 mg twice daily CMP, CBC, TSH

## 2020-07-01 NOTE — Assessment & Plan Note (Signed)
Chronic Taking vitamin D daily-2000 units daily Check vitamin D level

## 2020-07-01 NOTE — Assessment & Plan Note (Signed)
Chronic Has not tolerated > 1 statin, given age benefits of statins likely do not outweigh risks Healthy diet and staying active encouraged

## 2020-07-01 NOTE — Assessment & Plan Note (Signed)
Chronic  Clinically euthyroid Currently taking levothyroxine 75 mcg daily for 5 days a week and 37.5 mcg twice a week Check tsh  Titrate med dose if needed

## 2020-07-02 LAB — IRON,TIBC AND FERRITIN PANEL
%SAT: 20 % (calc) (ref 16–45)
Ferritin: 28 ng/mL (ref 16–288)
Iron: 78 ug/dL (ref 45–160)
TIBC: 388 mcg/dL (calc) (ref 250–450)

## 2020-07-31 ENCOUNTER — Other Ambulatory Visit: Payer: Self-pay | Admitting: Internal Medicine

## 2020-08-01 NOTE — Telephone Encounter (Signed)
Refill request

## 2020-09-11 ENCOUNTER — Other Ambulatory Visit: Payer: Self-pay | Admitting: Internal Medicine

## 2020-09-19 ENCOUNTER — Other Ambulatory Visit: Payer: Self-pay | Admitting: Internal Medicine

## 2020-09-23 ENCOUNTER — Ambulatory Visit: Payer: Medicare Other | Admitting: Obstetrics & Gynecology

## 2020-09-25 NOTE — Progress Notes (Signed)
Cardiology Office Note   Date:  09/27/2020   ID:  [name], [name] 02-Jan-1928, MRN [id number]  PCP:  Binnie Rail, MD    No chief complaint on file.  Apical variant HOCM  Wt Readings from Last 3 Encounters:  09/27/20 143 lb 12.8 oz (65.2 kg)  07/01/20 144 lb (65.3 kg)  04/19/20 147 lb (66.7 kg)       History of Present Illness: [name] is a 87 y.o. female  who previously saw Dr. Saunders Revel.  She has persistent atrial fibrillation, apical variant hypertrophic cardiomyopathy, hypertension, hyperlipidemia, chronic kidney disease, anemia. She underwent cardioversion in 2018.Herbeta-blockerdose has been reduced due to symptoms of fatigue anddyspnea on exertion. She was feeling better at her last visit with me in 02/2018. We decided to try to switch her from a beta-blockerto acalcium channel blocker. However, she could not tolerate this and was placed back on her beta-blocker. CHADS2-VASc=4  She has had some DOE.  She also has some anxiety when she wakes up, worse with COVID.  She got her vaccine shots.  In 2021, she noted: "She states she feels sick when she takes the Lasix.  She also reports not having energy.  She has not been exercising much.  Feels like the last year of staying in size has cut down on her activity and her stamina in general."  She has some DOE.  SHe does not walk much.  She does not drive.  SHe does well with a cart at a store.  She has some occasional leg swelling, but they are back to normal by normal.    Denies : Chest pain. Dizziness. Nitroglycerin use. Orthopnea. Palpitations. Paroxysmal nocturnal dyspnea.  Syncope.   She checks her HR and O2 sat.  O2 sat has been in the 90s.  HR has been less than 100.   Denies : Chest pain. Dizziness. Leg edema. Nitroglycerin use. Orthopnea. Palpitations. Paroxysmal nocturnal dyspnea. Shortness of breath. Syncope.     Past Medical History:  Diagnosis Date  . Adrenal myelolipoma   . Apical  variant hypertrophic cardiomyopathy ([place])    Echo 4/18: Normal LV size with marked apical hypertrophy. LVEF 60-65%. No WMA. Mild MR. Moderate left atrial enlargement. Normal RV size and function. Mild right atrial enlargemen  . Chronic kidney disease   . DIVERTICULOSIS, COLON   . Dysmetabolic syndrome X   . FIBROMYALGIA   . Gastric ulcer due to Helicobacter pylori   . Hiatal hernia   . Hx of cataract surgery 01/08/2016  . HYPERLIPIDEMIA   . Hyperplastic colon polyp   . HYPERTENSION, ESSENTIAL NOS   . Hyponatremia   . Hypothyroidism   . MYCOSIS FUNGOIDES   . Nodule of left lung   . Normocytic anemia   . Olecranon fracture 01/07/2016  . OSTEOARTHRITIS   . OSTEOPOROSIS   . Persistent atrial fibrillation ([place])    s/p DCCV 2018 // Apixaban for anticoag  . Renal angiomyolipoma   . Unspecified vitamin D deficiency     Past Surgical History:  Procedure Laterality Date  . CARDIOVERSION N/A 01/03/2017   Procedure: CARDIOVERSION;  Surgeon: Josue Hector, MD;  Location: [facility] ENDOSCOPY;  Service: Cardiovascular;  Laterality: N/A;  . colonoscopy with polypectomy  07/25/2007   Dr Olevia Perches, Recheck in 7 years for 2009  . G 4 P 2    . I & D EXTREMITY Right 01/08/2016   Procedure: IRRIGATION AND DEBRIDEMENT OF OLECRANON FRACTURE;  Surgeon: Renette Butters, MD;  Location: [place];  Service: Orthopedics;  Laterality: Right;  . ORIF ELBOW FRACTURE Right 01/08/2016   Procedure: OPEN REDUCTION INTERNAL FIXATION (ORIF) ELBOW/OLECRANON FRACTURE, NEUROLYSIS;  Surgeon: Renette Butters, MD;  Location: [place];  Service: Orthopedics;  Laterality: Right;     Current Outpatient Medications  Medication Sig Dispense Refill  . beta carotene w/minerals (OCUVITE) tablet Take 1 tablet by mouth daily.    . carvedilol (COREG) 3.125 MG tablet TAKE 1 TABLET BY MOUTH TWICE A DAY 180 tablet 3  . Cholecalciferol 50 MCG (2000 UT) CAPS Take 1 capsule (2,000 Units total) by mouth daily. 30 capsule   . ELIQUIS 5 MG TABS tablet TAKE  1 TABLET BY MOUTH TWICE A DAY 60 tablet 5  . furosemide (LASIX) 20 MG tablet Take 1 tablet (20 mg total) by mouth daily as needed. 30 tablet 11  . levothyroxine (SYNTHROID) 75 MCG tablet TAKE 1 TABLET BY MOUTH DAILY EXCEPT TAKE 1/2 TABLET ON TUESDAYS AND SATURDAY 72 tablet 0  . lisinopril (ZESTRIL) 40 MG tablet Take 1 tablet (40 mg total) by mouth daily. Please keep upcoming appt in April 2022 with Dr. Irish Lack before anymore refills. Thank you 90 tablet 0  . LORazepam (ATIVAN) 0.5 MG tablet TAKE 1/2 TABLET BY MOUTH DAILY AS NEEDED FOR ANXIETY 30 tablet 1  . omeprazole (PRILOSEC) 40 MG capsule Take 1 capsule (40 mg total) by mouth daily. 90 capsule 3  . vitamin B-12 (CYANOCOBALAMIN) 1000 MCG tablet Take 1 tablet (1,000 mcg total) by mouth daily.     No current facility-administered medications for this visit.    Allergies:   Achromycin [tetracycline hcl], Celecoxib, Clarithromycin, Ezetimibe-simvastatin, Flagyl [metronidazole], Penicillins, Simvastatin, Clonazepam, Erythromycin, Sertraline, Tramadol, and Abilify [aripiprazole]    Social History:  The patient  reports that she quit smoking about 66 years ago. She has a 7.00 pack-year smoking history. She has never used smokeless tobacco. She reports that she does not drink alcohol and does not use drugs.   Family History:  The patient's family history includes Diabetes in her mother; Heart attack (age of onset: 2) in her maternal uncle; Hypertension in her maternal grandmother; Stroke (age of onset: 38) in her maternal grandmother.    ROS:  Please see the history of present illness.   Otherwise, review of systems are positive for occasional ankle edema.   All other systems are reviewed and negative.    PHYSICAL EXAM: VS:  BP (!) 144/80   Pulse 96   Ht 4\' 10"  (1.473 m)   Wt 143 lb 12.8 oz (65.2 kg)   LMP  (LMP Unknown)   SpO2 99%   BMI 30.05 kg/m  , BMI Body mass index is 30.05 kg/m. GEN: Well nourished, well developed, in no acute  distress  HEENT: normal  Neck: no JVD, carotid bruits, or masses Cardiac: irregularly irregular; no murmurs, rubs, or gallops,tr ankle bilateral edema  Respiratory:  clear to auscultation bilaterally, normal work of breathing GI: soft, nontender, nondistended, + BS MS: no deformity or atrophy  Skin: warm and dry, no rash Neuro:  Strength and sensation are intact Psych: euthymic mood, full affect   EKG:   The ekg ordered today demonstrates AFib, rate controlled   Recent Labs: 07/01/2020: ALT 9; BUN 24; Creatinine, Ser 1.28; Hemoglobin 12.3; Platelets 258.0; Potassium 4.9; Sodium 140; TSH 0.99   Lipid Panel    Component Value Date/Time   CHOL 221 (H) 07/01/2019 1602   TRIG 173.0 (H) 07/01/2019 1602   HDL  55.20 07/01/2019 1602   CHOLHDL 4 07/01/2019 1602   VLDL 34.6 07/01/2019 1602   LDLCALC 131 (H) 07/01/2019 1602   LDLDIRECT 128.9 09/09/2012 1129     Other studies Reviewed: Additional studies/ records that were reviewed today with results demonstrating: labs reviewed.   ASSESSMENT AND PLAN:  1. Apical variant HOCM: Appears euvolemic.  2. PAF: Back in AFib.  New from 4/21. If she develops sx of palpitations.  If she develops sx, would have to consider Amiodarone.  Will have her use the prn Lasix more frequently, when she has DOE or morning LE edema.  Increase Coreg to 6.25 mg BID for better rate control.  We discussed cardioversion, but since she is not having symptoms, would not pursue at this time. 3. HTN: The current medical regimen is effective;  continue present plan and medications.  Decrease sodium in diet.  4. Hyperlipidemia: Avoid processed foods. Has been intolerant of statins.  We discussed whole food, plant based diet.    Current medicines are reviewed at length with the patient today.  The patient concerns regarding her medicines were addressed.  The following changes have been made: Increase carvedilol  Labs/ tests ordered today include:  No orders of the  defined types were placed in this encounter.   Recommend 150 minutes/week of aerobic exercise Low fat, low carb, high fiber diet recommended  Disposition:   FU in 3-4 months   Signed, Larae Grooms, MD  09/27/2020 3:17 PM    [facility] [address], [place], [place]  [address] Phone: [phone]; Fax: [phone]

## 2020-09-27 ENCOUNTER — Ambulatory Visit (INDEPENDENT_AMBULATORY_CARE_PROVIDER_SITE_OTHER): Payer: Medicare Other | Admitting: Interventional Cardiology

## 2020-09-27 ENCOUNTER — Encounter: Payer: Self-pay | Admitting: Interventional Cardiology

## 2020-09-27 ENCOUNTER — Other Ambulatory Visit: Payer: Self-pay

## 2020-09-27 VITALS — BP 144/80 | HR 96 | Ht <= 58 in | Wt 143.8 lb

## 2020-09-27 DIAGNOSIS — I4819 Other persistent atrial fibrillation: Secondary | ICD-10-CM | POA: Diagnosis not present

## 2020-09-27 DIAGNOSIS — I10 Essential (primary) hypertension: Secondary | ICD-10-CM | POA: Diagnosis not present

## 2020-09-27 DIAGNOSIS — I422 Other hypertrophic cardiomyopathy: Secondary | ICD-10-CM

## 2020-09-27 DIAGNOSIS — E782 Mixed hyperlipidemia: Secondary | ICD-10-CM | POA: Diagnosis not present

## 2020-09-27 MED ORDER — CARVEDILOL 6.25 MG PO TABS: 6.2500 mg | ORAL_TABLET | Freq: Two times a day (BID) | ORAL | 3 refills | Status: DC

## 2020-09-27 NOTE — Patient Instructions (Signed)
Medication Instructions:  Your physician has recommended you make the following change in your medication: Increase carvedilol to 6.25 mg by mouth twice daily. Take furosemide one to two times per week  *If you need a refill on your cardiac medications before your next appointment, please call your pharmacy*   Lab Work: none If you have labs (blood work) drawn today and your tests are completely normal, you will receive your results only by: Marland Kitchen MyChart Message (if you have MyChart) OR . A paper copy in the mail If you have any lab test that is abnormal or we need to change your treatment, we will call you to review the results.   Testing/Procedures:  none   Follow-Up: At [facility], you and your health needs are our priority.  As part of our continuing mission to provide you with exceptional heart care, we have created designated Provider Care Teams.  These Care Teams include your primary Cardiologist (physician) and Advanced Practice Providers (APPs -  Physician Assistants and Nurse Practitioners) who all work together to provide you with the care you need, when you need it.  We recommend signing up for the patient portal called "MyChart".  Sign up information is provided on this After Visit Summary.  MyChart is used to connect with patients for Virtual Visits (Telemedicine).  Patients are able to view lab/test results, encounter notes, upcoming appointments, etc.  Non-urgent messages can be sent to your provider as well.   To learn more about what you can do with MyChart, go to [email or web address].    Your next appointment:   August 11,2022 at 3:00  The format for your next appointment:   In Person  Provider:    Dr Irish Lack    Other Instructions

## 2020-09-30 ENCOUNTER — Encounter: Payer: Self-pay | Admitting: Obstetrics & Gynecology

## 2020-09-30 ENCOUNTER — Other Ambulatory Visit: Payer: Self-pay

## 2020-09-30 ENCOUNTER — Other Ambulatory Visit: Payer: Self-pay | Admitting: Obstetrics & Gynecology

## 2020-09-30 ENCOUNTER — Ambulatory Visit (INDEPENDENT_AMBULATORY_CARE_PROVIDER_SITE_OTHER): Payer: Medicare Other | Admitting: Obstetrics & Gynecology

## 2020-09-30 VITALS — BP 128/86

## 2020-09-30 DIAGNOSIS — T8389XA Other specified complication of genitourinary prosthetic devices, implants and grafts, initial encounter: Secondary | ICD-10-CM | POA: Diagnosis not present

## 2020-09-30 DIAGNOSIS — N898 Other specified noninflammatory disorders of vagina: Secondary | ICD-10-CM

## 2020-09-30 DIAGNOSIS — Z4689 Encounter for fitting and adjustment of other specified devices: Secondary | ICD-10-CM

## 2020-09-30 LAB — WET PREP FOR TRICH, YEAST, CLUE

## 2020-09-30 MED ORDER — TINIDAZOLE 500 MG PO TABS: 1000.0000 mg | ORAL_TABLET | Freq: Two times a day (BID) | ORAL | 0 refills | Status: DC

## 2020-09-30 NOTE — Progress Notes (Signed)
    NIGERIA LASSETER 1928/03/23 [id number]        87 y.o.  [id number]   RP: Pessary maintenance  HPI: Well with Milex ring #5 with support.  Similar vaginal discharge as before.  Very mild spotting noted last week.  No vaginal or pelvic pain.  Urine normal.  No fever.   OB History  Gravida Para Term Preterm AB Living  4 2     2 2   SAB IAB Ectopic Multiple Live Births  2            # Outcome Date GA Lbr Len/2nd Weight Sex Delivery Anes PTL Lv  4 SAB           3 SAB           2 Para           1 Para             Past medical history,surgical history, problem list, medications, allergies, family history and social history were all reviewed and documented in the EPIC chart.   Directed ROS with pertinent positives and negatives documented in the history of present illness/assessment and plan.  Exam:  Vitals:   09/30/20 1113  BP: 128/86   General appearance:  Normal  Abdomen: Normal  Gynecologic exam: Vulva normal.  Pessary removed easily and cleaned.  Vaginal discharge increased.  Wet prep done.  Speculum exam showing mild erythema at the vaginal fornix.  No ulceration.  Decision to put the pessary back in.  Wet prep positive for bacterial vaginosis.   Assessment/Plan:  87 y.o. [id number]   1. Pessary maintenance Well with Milex ring #5 with support.  Minor vaginal irritation possibly due to bacterial vaginosis.  Will treat.  Pessary cleaned and put back in place.  Follow-up in 4 weeks to reassess the vaginal mucosa.  2. Vaginal irritation from pessary [facility]) Vaginal irritation from pessary, possibly worsen by bacterial vaginosis.  No ulceration.  Decision to treat BV and put the pessary back in place.  We will follow-up in 4 weeks to reassess the vaginal mucosa.  3. Vaginal discharge Bacterial vaginosis present.  Will treat with tinidazole.  No allergy to tinidazole.  Usage reviewed and prescription sent to pharmacy. - WET PREP FOR TRICH, YEAST, Frederico Hamman MD,  11:29 AM 09/30/2020

## 2020-09-30 NOTE — Telephone Encounter (Signed)
Spoke with patient daughter Opal Sidles ( has DPR access) and patient will pay out of pocket for medication.

## 2020-10-01 ENCOUNTER — Encounter: Payer: Self-pay | Admitting: Obstetrics & Gynecology

## 2020-10-24 ENCOUNTER — Telehealth: Payer: Self-pay | Admitting: Internal Medicine

## 2020-10-24 NOTE — Progress Notes (Signed)
  Chronic Care Management   Outreach Note  10/24/2020 Name: [name] MRN: [id number] DOB: [birth year]  Referred by: Binnie Rail, MD Reason for referral : No chief complaint on file.   An unsuccessful telephone outreach was attempted today. The patient was referred to the pharmacist for assistance with care management and care coordination.   Follow Up Plan:  [place]

## 2020-10-28 ENCOUNTER — Encounter: Payer: Self-pay | Admitting: Obstetrics & Gynecology

## 2020-10-28 ENCOUNTER — Ambulatory Visit (INDEPENDENT_AMBULATORY_CARE_PROVIDER_SITE_OTHER): Payer: Medicare Other | Admitting: Obstetrics & Gynecology

## 2020-10-28 ENCOUNTER — Other Ambulatory Visit: Payer: Self-pay

## 2020-10-28 VITALS — BP 140/80

## 2020-10-28 DIAGNOSIS — N898 Other specified noninflammatory disorders of vagina: Secondary | ICD-10-CM | POA: Diagnosis not present

## 2020-10-28 DIAGNOSIS — T8389XA Other specified complication of genitourinary prosthetic devices, implants and grafts, initial encounter: Secondary | ICD-10-CM | POA: Diagnosis not present

## 2020-10-28 DIAGNOSIS — Z4689 Encounter for fitting and adjustment of other specified devices: Secondary | ICD-10-CM | POA: Diagnosis not present

## 2020-10-28 NOTE — Progress Notes (Signed)
    [name] 1927-07-31 [id number]        87 y.o.  [id number]   RP: F/U Vaginal inflammation/BV with pessary  HPI: Better after treatment of BV with Tinidazole 09/30/2020.  Normal vaginal secretions now.  No vaginal bleeding.  No pelvic or vaginal pain.  Well with the Milex ring pessary #5 with support.   OB History  Gravida Para Term Preterm AB Living  4 2     2 2   SAB IAB Ectopic Multiple Live Births  2            # Outcome Date GA Lbr Len/2nd Weight Sex Delivery Anes PTL Lv  4 SAB           3 SAB           2 Para           1 Para             Past medical history,surgical history, problem list, medications, allergies, family history and social history were all reviewed and documented in the EPIC chart.   Directed ROS with pertinent positives and negatives documented in the history of present illness/assessment and plan.  Exam:  Vitals:   10/28/20 1457  BP: 140/80   General appearance:  Normal  Lungs clear bilaterally with good A/E  Gynecologic exam: Vulva normal.  Pessary removed and cleaned.  Light normal discharge.  No bleeding.  Vaginal mucosa intact, no erythema, erosion or ulcer.  Pessary put back in place easily.   Assessment/Plan:  87 y.o. [id number]   1. Vaginal irritation from pessary [facility]) Resolved vaginal irritation from pessary post BV treatment.  Reassured.  2. Pessary maintenance Pessary cleaned and put back in place.  Good fit.  F/U in 4 months.  Princess Bruins MD, 3:07 PM 10/28/2020

## 2020-10-31 ENCOUNTER — Encounter: Payer: Self-pay | Admitting: Obstetrics & Gynecology

## 2020-11-01 ENCOUNTER — Other Ambulatory Visit: Payer: Self-pay | Admitting: Interventional Cardiology

## 2020-11-03 DIAGNOSIS — Z23 Encounter for immunization: Secondary | ICD-10-CM | POA: Diagnosis not present

## 2020-11-14 ENCOUNTER — Other Ambulatory Visit: Payer: Self-pay | Admitting: Interventional Cardiology

## 2020-11-29 ENCOUNTER — Ambulatory Visit: Payer: Medicare Other | Admitting: Internal Medicine

## 2020-12-09 ENCOUNTER — Other Ambulatory Visit: Payer: Self-pay | Admitting: Internal Medicine

## 2020-12-17 ENCOUNTER — Other Ambulatory Visit: Payer: Self-pay | Admitting: Interventional Cardiology

## 2020-12-19 NOTE — Telephone Encounter (Signed)
Pt's age 87, wt 65.2 kg, SCr 1.28, CrCl 34.68, last ov w/ JV 09/27/20.

## 2020-12-27 NOTE — Patient Instructions (Addendum)
    Blood work was ordered.      Medications changes include :   none     Please followup in 6 months  

## 2020-12-27 NOTE — Progress Notes (Signed)
Subjective:    Patient ID: [name], female    DOB: 08-Sep-1927, 87 y.o.   MRN: [id number]  HPI The patient is here for follow up of their chronic medical problems, including Afib, htn, hypothyroidism, GERD, iron def anemia, anxiety,  stage 3 CKD, vitamin d def.  She is here today with her daughter  Lasix as needed only for RLE edema from knee.  She states she does not take that that often because it makes her fearful any.  The leg swelling typically improves overnight.  Leg swelling does not bother her except for not being able to fit into her shoes.  Medications and allergies reviewed with patient and updated if appropriate.  Patient Active Problem List   Diagnosis Date Noted   Aortic atherosclerosis ([place]) 07/01/2020   Female bladder prolapse 11/15/2017   Decreased hearing of left ear 11/15/2017   Iron deficiency anemia 09/10/2017   Shortness of breath 09/10/2017   Hyperglycemia 05/10/2017   Persistent atrial fibrillation ([place]) 08/20/2016   GERD (gastroesophageal reflux disease) 02/20/2016   Right knee pain 01/20/2016   Left hip pain 01/20/2016   Peripheral edema [id number]   Systolic murmur [id number]   Anxiety 07/18/2013   Nodule of left lung 07/18/2013   Apical variant hypertrophic cardiomyopathy ([place]) 08/23/2009   Vitamin D deficiency 01/21/2009   Chronic kidney disease (CKD), stage III (moderate) ([place]) 01/21/2009   MYCOSIS FUNGOIDES 05/21/2008   DIVERTICULOSIS, COLON 05/21/2008   OSTEOARTHRITIS 06/02/2007   Hypothyroidism 04/28/2007   HYPERLIPIDEMIA 04/28/2007   Essential hypertension 04/28/2007   Osteoporosis 04/28/2007   FIBROMYALGIA 11/14/2006    Current Outpatient Medications on File Prior to Visit  Medication Sig Dispense Refill   beta carotene w/minerals (OCUVITE) tablet Take 1 tablet by mouth daily.     carvedilol (COREG) 6.25 MG tablet Take 1 tablet (6.25 mg total) by mouth 2 (two) times daily. 180 tablet 3   Cholecalciferol 50 MCG (2000 UT) CAPS  Take 1 capsule (2,000 Units total) by mouth daily. 30 capsule    ELIQUIS 5 MG TABS tablet TAKE 1 TABLET BY MOUTH TWICE A DAY 60 tablet 5   furosemide (LASIX) 20 MG tablet Take 1 tablet (20 mg total) by mouth daily as needed. 30 tablet 11   levothyroxine (SYNTHROID) 75 MCG tablet TAKE 1 TABLET BY MOUTH DAILY EXCEPT TAKE 1/2 TABLET ON TUESDAYS AND SATURDAY 72 tablet 0   lisinopril (ZESTRIL) 40 MG tablet Take 1 tablet (40 mg total) by mouth daily. 90 tablet 3   LORazepam (ATIVAN) 0.5 MG tablet TAKE 1/2 TABLET BY MOUTH DAILY AS NEEDED FOR ANXIETY 30 tablet 1   omeprazole (PRILOSEC) 40 MG capsule Take 1 capsule (40 mg total) by mouth daily. 90 capsule 3   vitamin B-12 (CYANOCOBALAMIN) 1000 MCG tablet Take 1 tablet (1,000 mcg total) by mouth daily.     No current facility-administered medications on file prior to visit.    Past Medical History:  Diagnosis Date   Adrenal myelolipoma    Apical variant hypertrophic cardiomyopathy ([place])    Echo 4/18: Normal LV size with marked apical hypertrophy. LVEF 60-65%. No WMA. Mild MR. Moderate left atrial enlargement. Normal RV size and function. Mild right atrial enlargemen   Chronic kidney disease    DIVERTICULOSIS, COLON    Dysmetabolic syndrome X    FIBROMYALGIA    Gastric ulcer due to Helicobacter pylori    Hiatal hernia    Hx of cataract surgery 01/08/2016   HYPERLIPIDEMIA  Hyperplastic colon polyp    HYPERTENSION, ESSENTIAL NOS    Hyponatremia    Hypothyroidism    MYCOSIS FUNGOIDES    Nodule of left lung    Normocytic anemia    Olecranon fracture 01/07/2016   OSTEOARTHRITIS    OSTEOPOROSIS    Persistent atrial fibrillation ([place])    s/p DCCV 2018 // Apixaban for anticoag   Renal angiomyolipoma    Unspecified vitamin D deficiency     Past Surgical History:  Procedure Laterality Date   CARDIOVERSION N/A 01/03/2017   Procedure: CARDIOVERSION;  Surgeon: Josue Hector, MD;  Location: [facility] ENDOSCOPY;  Service: Cardiovascular;  Laterality: N/A;    colonoscopy with polypectomy  07/25/2007   Dr Olevia Perches, Recheck in 7 years for 2009   G 4 P 2     I & D EXTREMITY Right 01/08/2016   Procedure: IRRIGATION AND DEBRIDEMENT OF OLECRANON FRACTURE;  Surgeon: Renette Butters, MD;  Location: [place];  Service: Orthopedics;  Laterality: Right;   ORIF ELBOW FRACTURE Right 01/08/2016   Procedure: OPEN REDUCTION INTERNAL FIXATION (ORIF) ELBOW/OLECRANON FRACTURE, NEUROLYSIS;  Surgeon: Renette Butters, MD;  Location: [place];  Service: Orthopedics;  Laterality: Right;    Social History   Socioeconomic History   Marital status: Widowed    Spouse name: Not on file   Number of children: 2   Years of education: Not on file   Highest education level: Not on file  Occupational History   Occupation: Retired    Fish farm manager: RETIRED  Tobacco Use   Smoking status: Former    Packs/day: 0.50    Years: 14.00    Pack years: 7.00    Types: Cigarettes    Quit date: 1956    Years since quitting: 66.6   Smokeless tobacco: Never   Tobacco comments:    smoked 1951-1965 , up to 1/2 ppd  Vaping Use   Vaping Use: Never used  Substance and Sexual Activity   Alcohol use: No    Alcohol/week: 0.0 standard drinks   Drug use: No   Sexual activity: Never  Other Topics Concern   Not on file  Social History Narrative   Widowed.  Lives alone.  Ambulates without assistance.  Steady on feet.   Social Determinants of Health   Financial Resource Strain: Not on file  Food Insecurity: Not on file  Transportation Needs: Not on file  Physical Activity: Not on file  Stress: Not on file  Social Connections: Not on file    Family History  Problem Relation Age of Onset   Diabetes Mother    Heart attack Maternal Uncle 74       his 2 sons had MI in 22s   Hypertension Maternal Grandmother    Stroke Maternal Grandmother 69   Cancer Neg Hx    Asthma Neg Hx    COPD Neg Hx     Review of Systems  Respiratory:  Negative for cough, shortness of breath and wheezing.    Cardiovascular:  Positive for chest pain (under left breast) and leg swelling. Negative for palpitations.  Gastrointestinal:  Positive for nausea (first thing in the morning). Negative for abdominal pain.       No gerd  Neurological:  Negative for light-headedness and headaches.      Objective:   Vitals:   12/28/20 1425  BP: 130/82  Pulse: 98  Temp: 98.5 F (36.9 C)  SpO2: 99%   BP Readings from Last 3 Encounters:  12/28/20 130/82  10/28/20  140/80  09/30/20 128/86   Wt Readings from Last 3 Encounters:  12/28/20 146 lb (66.2 kg)  09/27/20 143 lb 12.8 oz (65.2 kg)  07/01/20 144 lb (65.3 kg)   Body mass index is 30.51 kg/m.   Physical Exam    Constitutional: Appears well-developed and well-nourished. No distress.  HENT:  Head: Normocephalic and atraumatic.  Neck: Neck supple. No tracheal deviation present. No thyromegaly present.  No cervical lymphadenopathy Cardiovascular: Normal rate, irregular rhythm and normal heart sounds.   2/6 systolic murmur heard. No carotid bruit .  Mild bilateral lower extremity edema Pulmonary/Chest: Effort normal and breath sounds normal. No respiratory distress. No has no wheezes. No rales.  Skin: Skin is warm and dry. Not diaphoretic.  Psychiatric: Normal mood and affect. Behavior is normal.      Assessment & Plan:    See Problem List for Assessment and Plan of chronic medical problems.    This visit occurred during the SARS-CoV-2 public health emergency.  Safety protocols were in place, including screening questions prior to the visit, additional usage of staff PPE, and extensive cleaning of exam room while observing appropriate contact time as indicated for disinfecting solutions.

## 2020-12-28 ENCOUNTER — Other Ambulatory Visit: Payer: Self-pay

## 2020-12-28 ENCOUNTER — Encounter: Payer: Self-pay | Admitting: Internal Medicine

## 2020-12-28 ENCOUNTER — Ambulatory Visit (INDEPENDENT_AMBULATORY_CARE_PROVIDER_SITE_OTHER): Payer: Medicare Other | Admitting: Internal Medicine

## 2020-12-28 VITALS — BP 130/82 | HR 98 | Temp 98.5°F | Ht <= 58 in | Wt 146.0 lb

## 2020-12-28 DIAGNOSIS — F419 Anxiety disorder, unspecified: Secondary | ICD-10-CM | POA: Diagnosis not present

## 2020-12-28 DIAGNOSIS — R739 Hyperglycemia, unspecified: Secondary | ICD-10-CM

## 2020-12-28 DIAGNOSIS — N1831 Chronic kidney disease, stage 3a: Secondary | ICD-10-CM | POA: Diagnosis not present

## 2020-12-28 DIAGNOSIS — D509 Iron deficiency anemia, unspecified: Secondary | ICD-10-CM

## 2020-12-28 DIAGNOSIS — I10 Essential (primary) hypertension: Secondary | ICD-10-CM

## 2020-12-28 DIAGNOSIS — I4819 Other persistent atrial fibrillation: Secondary | ICD-10-CM | POA: Diagnosis not present

## 2020-12-28 DIAGNOSIS — E038 Other specified hypothyroidism: Secondary | ICD-10-CM

## 2020-12-28 DIAGNOSIS — E782 Mixed hyperlipidemia: Secondary | ICD-10-CM

## 2020-12-28 DIAGNOSIS — K219 Gastro-esophageal reflux disease without esophagitis: Secondary | ICD-10-CM | POA: Diagnosis not present

## 2020-12-28 DIAGNOSIS — E559 Vitamin D deficiency, unspecified: Secondary | ICD-10-CM

## 2020-12-28 LAB — COMPREHENSIVE METABOLIC PANEL
ALT: 6 U/L (ref 0–35)
AST: 11 U/L (ref 0–37)
Albumin: 3.9 g/dL (ref 3.5–5.2)
Alkaline Phosphatase: 68 U/L (ref 39–117)
BUN: 15 mg/dL (ref 6–23)
CO2: 24 mEq/L (ref 19–32)
Calcium: 9.3 mg/dL (ref 8.4–10.5)
Chloride: 105 mEq/L (ref 96–112)
Creatinine, Ser: 1.12 mg/dL (ref 0.40–1.20)
GFR: 43.17 mL/min — ABNORMAL LOW (ref >60.00)
Glucose, Bld: 98 mg/dL (ref 70–99)
Potassium: 4.4 mEq/L (ref 3.5–5.1)
Sodium: 138 mEq/L (ref 135–145)
Total Bilirubin: 0.4 mg/dL (ref 0.2–1.2)
Total Protein: 6.6 g/dL (ref 6.0–8.3)

## 2020-12-28 LAB — CBC WITH DIFFERENTIAL/PLATELET
Basophils Absolute: 0 10*3/uL (ref 0.0–0.1)
Basophils Relative: 0.7 % (ref 0.0–3.0)
Eosinophils Absolute: 0.1 10*3/uL (ref 0.0–0.7)
Eosinophils Relative: 1.3 % (ref 0.0–5.0)
HCT: 35.1 % — ABNORMAL LOW (ref 36.0–46.0)
Hemoglobin: 11.4 g/dL — ABNORMAL LOW (ref 12.0–15.0)
Lymphocytes Relative: 28.7 % (ref 12.0–46.0)
Lymphs Abs: 1.7 10*3/uL (ref 0.7–4.0)
MCHC: 32.6 g/dL (ref 30.0–36.0)
MCV: 89.2 fl (ref 78.0–100.0)
Monocytes Absolute: 0.4 10*3/uL (ref 0.1–1.0)
Monocytes Relative: 7.1 % (ref 3.0–12.0)
Neutro Abs: 3.7 10*3/uL (ref 1.4–7.7)
Neutrophils Relative %: 62.2 % (ref 43.0–77.0)
Platelets: 227 10*3/uL (ref 150.0–400.0)
RBC: 3.94 Mil/uL (ref 3.87–5.11)
RDW: 16.9 % — ABNORMAL HIGH (ref 11.5–15.5)
WBC: 6 10*3/uL (ref 4.0–10.5)

## 2020-12-28 LAB — VITAMIN D 25 HYDROXY (VIT D DEFICIENCY, FRACTURES): VITD: 43.68 ng/mL (ref 30.00–100.00)

## 2020-12-28 NOTE — Assessment & Plan Note (Signed)
Chronic Following with cardiology Doing well on Eliquis 5 mg twice daily On Coreg 6.25 mg twice daily CBC, CMP

## 2020-12-28 NOTE — Assessment & Plan Note (Addendum)
Chronic Not on any medication related to age and intolerance to statins

## 2020-12-28 NOTE — Assessment & Plan Note (Signed)
Chronic Intermittent Continue lorazepam 0.25 mg daily as needed

## 2020-12-28 NOTE — Assessment & Plan Note (Signed)
Chronic Has been stable CMP, CBC 

## 2020-12-28 NOTE — Assessment & Plan Note (Signed)
Chronic  Clinically euthyroid Currently taking levothyroxine 75 mcg 5 days a week and 37.5 mcg two times a week Check tsh  Titrate med dose if needed

## 2020-12-28 NOTE — Assessment & Plan Note (Signed)
Chronic BP well controlled Continue coreg 3.125 mg bid, lisinopril 40 mg qd cmp

## 2020-12-28 NOTE — Assessment & Plan Note (Signed)
Chronic History of iron deficiency anemia, on Eliquis CBC, iron panel

## 2020-12-28 NOTE — Assessment & Plan Note (Signed)
Chronic Taking vitamin D daily Check vitamin D level  

## 2020-12-28 NOTE — Assessment & Plan Note (Signed)
Chronic GERD controlled Continue omeprazole 40 mg daily, pepcid 20 mg HS prn

## 2020-12-29 ENCOUNTER — Telehealth: Payer: Self-pay | Admitting: Internal Medicine

## 2020-12-29 LAB — IRON,TIBC AND FERRITIN PANEL
%SAT: 14 % (calc) — ABNORMAL LOW (ref 16–45)
Ferritin: 24 ng/mL (ref 16–288)
Iron: 48 ug/dL (ref 45–160)
TIBC: 333 mcg/dL (calc) (ref 250–450)

## 2020-12-29 NOTE — Telephone Encounter (Signed)
Patient called in requesting results from yesterdays labs   Would also like copy mailed to her

## 2020-12-30 ENCOUNTER — Ambulatory Visit: Payer: Medicare Other | Admitting: Internal Medicine

## 2020-12-30 NOTE — Telephone Encounter (Signed)
Lab results mailed out today.  Patient viewed results on line:  Written by Binnie Rail, MD on 12/29/2020  7:25 AM EDT Seen by patient [name] on 12/29/2020  4:59 PM

## 2021-01-11 ENCOUNTER — Telehealth: Payer: Self-pay

## 2021-01-11 ENCOUNTER — Other Ambulatory Visit: Payer: Self-pay

## 2021-01-11 NOTE — Progress Notes (Signed)
Cardiology Office Note   Date:  01/12/2021   ID:  [name], Amber 13-Sep-1927, MRN [id number]  PCP:  Binnie Rail, MD    No chief complaint on file.  PAF  Wt Readings from Last 3 Encounters:  01/12/21 144 lb 3.2 oz (65.4 kg)  12/28/20 146 lb (66.2 kg)  09/27/20 143 lb 12.8 oz (65.2 kg)       History of Present Illness: [name] is a 87 y.o. female    who previously saw Dr. Saunders Revel.  She has persistent atrial fibrillation, apical variant hypertrophic cardiomyopathy, hypertension, hyperlipidemia, chronic kidney disease, anemia.  She underwent cardioversion in 2018.  Her beta-blocker dose has been reduced due to symptoms of fatigue and dyspnea on exertion.  She was feeling better at her last visit with me in 02/2018.  We decided to try to switch her from a beta-blocker to a calcium channel blocker.  However, she could not tolerate this and was placed back on her beta-blocker.    CHADS2-VASc=4    Noted in the past: "She has had some DOE.  She also has some anxiety when she wakes up, worse with COVID.  She got her vaccine shots.  "In 2021, she noted: "She states she feels sick when she takes the Lasix.  She also reports not having energy.  She has not been exercising much.  Feels like the last year of staying in size has cut down on her activity and her stamina in general. She does not drive.  SHe does well with a cart at a store.  She has some occasional leg swelling, but they are back to normal by normal.  "  Seen in 4/22 and Coreg was increased for better rate control.  Lasix use was also encouraged.  She has felt well.  Rare Lasix use.   Denies : Chest pain. Dizziness. Leg edema. Nitroglycerin use. Orthopnea. Palpitations. Paroxysmal nocturnal dyspnea. Shortness of breath. Syncope.   No falls.    Past Medical History:  Diagnosis Date   Adrenal myelolipoma    Apical variant hypertrophic cardiomyopathy ([place])    Echo 4/18: Normal LV size with marked apical hypertrophy.  LVEF 60-65%. No WMA. Mild MR. Moderate left atrial enlargement. Normal RV size and function. Mild right atrial enlargemen   Chronic kidney disease    DIVERTICULOSIS, COLON    Dysmetabolic syndrome X    FIBROMYALGIA    Gastric ulcer due to Helicobacter pylori    Hiatal hernia    Hx of cataract surgery 01/08/2016   HYPERLIPIDEMIA    Hyperplastic colon polyp    HYPERTENSION, ESSENTIAL NOS    Hyponatremia    Hypothyroidism    MYCOSIS FUNGOIDES    Nodule of left lung    Normocytic anemia    Olecranon fracture 01/07/2016   OSTEOARTHRITIS    OSTEOPOROSIS    Persistent atrial fibrillation ([place])    s/p DCCV 2018 // Apixaban for anticoag   Renal angiomyolipoma    Unspecified vitamin D deficiency     Past Surgical History:  Procedure Laterality Date   CARDIOVERSION N/A 01/03/2017   Procedure: CARDIOVERSION;  Surgeon: Josue Hector, MD;  Location: [facility] ENDOSCOPY;  Service: Cardiovascular;  Laterality: N/A;   colonoscopy with polypectomy  07/25/2007   Dr Olevia Perches, Recheck in 7 years for 2009   G 4 P 2     I & D EXTREMITY Right 01/08/2016   Procedure: IRRIGATION AND DEBRIDEMENT OF OLECRANON FRACTURE;  Surgeon: Renette Butters,  MD;  Location: [place];  Service: Orthopedics;  Laterality: Right;   ORIF ELBOW FRACTURE Right 01/08/2016   Procedure: OPEN REDUCTION INTERNAL FIXATION (ORIF) ELBOW/OLECRANON FRACTURE, NEUROLYSIS;  Surgeon: Renette Butters, MD;  Location: [place];  Service: Orthopedics;  Laterality: Right;     Current Outpatient Medications  Medication Sig Dispense Refill   beta carotene w/minerals (OCUVITE) tablet Take 1 tablet by mouth daily.     carvedilol (COREG) 6.25 MG tablet Take 1 tablet (6.25 mg total) by mouth 2 (two) times daily. 180 tablet 3   Cholecalciferol 50 MCG (2000 UT) CAPS Take 1 capsule (2,000 Units total) by mouth daily. 30 capsule    ELIQUIS 5 MG TABS tablet TAKE 1 TABLET BY MOUTH TWICE A DAY 60 tablet 5   furosemide (LASIX) 20 MG tablet Take 1 tablet (20 mg total) by  mouth daily as needed. 30 tablet 11   levothyroxine (SYNTHROID) 75 MCG tablet TAKE 1 TABLET BY MOUTH DAILY EXCEPT TAKE 1/2 TABLET ON TUESDAYS AND SATURDAY 72 tablet 0   lisinopril (ZESTRIL) 40 MG tablet Take 1 tablet (40 mg total) by mouth daily. 90 tablet 3   LORazepam (ATIVAN) 0.5 MG tablet TAKE 1/2 TABLET BY MOUTH DAILY AS NEEDED FOR ANXIETY 30 tablet 1   omeprazole (PRILOSEC) 40 MG capsule Take 1 capsule (40 mg total) by mouth daily. 90 capsule 3   vitamin B-12 (CYANOCOBALAMIN) 1000 MCG tablet Take 1 tablet (1,000 mcg total) by mouth daily.     No current facility-administered medications for this visit.    Allergies:   Achromycin [tetracycline hcl], Celecoxib, Clarithromycin, Ezetimibe-simvastatin, Flagyl [metronidazole], Penicillins, Simvastatin, Clonazepam, Erythromycin, Sertraline, Tramadol, and Abilify [aripiprazole]    Social History:  The patient  reports that she quit smoking about 66 years ago. Her smoking use included cigarettes. She has a 7.00 pack-year smoking history. She has never used smokeless tobacco. She reports that she does not drink alcohol and does not use drugs.   Family History:  The patient's family history includes Diabetes in her mother; Heart attack (age of onset: 35) in her maternal uncle; Hypertension in her maternal grandmother; Stroke (age of onset: 69) in her maternal grandmother.    ROS:  Please see the history of present illness.   Otherwise, review of systems are positive for bloody vaginal discharge- has a ring in place.   All other systems are reviewed and negative.    PHYSICAL EXAM: VS:  BP 138/76   Pulse (!) 57   Ht '4\' 10"'$  (1.473 m)   Wt 144 lb 3.2 oz (65.4 kg)   LMP  (LMP Unknown)   SpO2 97%   BMI 30.14 kg/m  , BMI Body mass index is 30.14 kg/m. GEN: Well nourished, well developed, in no acute distress HEENT: normal Neck: no JVD, carotid bruits, or masses Cardiac: irregularly irregular; no murmurs, rubs, or gallops,tr ankle edema   Respiratory:  clear to auscultation bilaterally, normal work of breathing GI: soft, nontender, nondistended, + BS, obese MS: no deformity or atrophy Skin: warm and dry, no rash Neuro:  Strength and sensation are intact Psych: euthymic mood, full affect   EKG:   The ekg ordered  in 09/2020 demonstrates AFib, rate controlled   Recent Labs: 07/01/2020: TSH 0.99 12/28/2020: ALT 6; BUN 15; Creatinine, Ser 1.12; Hemoglobin 11.4; Platelets 227.0; Potassium 4.4; Sodium 138   Lipid Panel    Component Value Date/Time   CHOL 221 (H) 07/01/2019 1602   TRIG 173.0 (H) 07/01/2019 1602  HDL 55.20 07/01/2019 1602   CHOLHDL 4 07/01/2019 1602   VLDL 34.6 07/01/2019 1602   LDLCALC 131 (H) 07/01/2019 1602   LDLDIRECT 128.9 09/09/2012 1129     Other studies Reviewed: Additional studies/ records that were reviewed today with results demonstrating: LDL 131.   ASSESSMENT AND PLAN:  Apical variant HOCM: appears euvolemic.  BP controlled.  PAF: Rate controlled. ELiquis for stroke prevention. TOlerating increased Carvedilol for rate control.   No need for amiodarone to help restore NSR. We considered this in the past but her sx are well controlled.  Can use prn Lasix as well if she has signs of fluid overload. Cr stable.  HTN: The current medical regimen is effective;  continue present plan and medications. Hyperlipidemia: LDL 131.  Healthy diet.     Current medicines are reviewed at length with the patient today.  The patient concerns regarding her medicines were addressed.  The following changes have been made:  No change  Labs/ tests ordered today include:  No orders of the defined types were placed in this encounter.   Recommend 150 minutes/week of aerobic exercise Low fat, low carb, high fiber diet recommended  Disposition:   FU in 1 year   Signed, Larae Grooms, MD  01/12/2021 3:27 PM    [facility] [address], [place], [place]  [address] Phone: [phone]; Fax: [phone]

## 2021-01-11 NOTE — Telephone Encounter (Signed)
Patient is inquiring if you would be willing to refill the Tindamax Rx for her.  She is having some pinkish discharge again and last time this Rx cleared it up. No odor, no burning or irritation.

## 2021-01-12 ENCOUNTER — Other Ambulatory Visit: Payer: Self-pay

## 2021-01-12 ENCOUNTER — Encounter: Payer: Self-pay | Admitting: Interventional Cardiology

## 2021-01-12 ENCOUNTER — Ambulatory Visit (INDEPENDENT_AMBULATORY_CARE_PROVIDER_SITE_OTHER): Payer: Medicare Other | Admitting: Interventional Cardiology

## 2021-01-12 VITALS — BP 138/76 | HR 57 | Ht <= 58 in | Wt 144.2 lb

## 2021-01-12 DIAGNOSIS — I4819 Other persistent atrial fibrillation: Secondary | ICD-10-CM

## 2021-01-12 DIAGNOSIS — E782 Mixed hyperlipidemia: Secondary | ICD-10-CM

## 2021-01-12 DIAGNOSIS — I422 Other hypertrophic cardiomyopathy: Secondary | ICD-10-CM

## 2021-01-12 DIAGNOSIS — I10 Essential (primary) hypertension: Secondary | ICD-10-CM | POA: Diagnosis not present

## 2021-01-12 NOTE — Patient Instructions (Signed)

## 2021-01-13 ENCOUNTER — Other Ambulatory Visit: Payer: Self-pay | Admitting: Obstetrics & Gynecology

## 2021-01-13 MED ORDER — TINIDAZOLE 500 MG PO TABS: ORAL_TABLET | ORAL | 0 refills | Status: DC

## 2021-01-13 NOTE — Telephone Encounter (Signed)
Patient informed Rx sent.   Per ML "Agree with Tindamax x 1 treatment.  If recurrence, needs a visit. "

## 2021-01-17 NOTE — Telephone Encounter (Signed)
, °

## 2021-01-31 ENCOUNTER — Other Ambulatory Visit: Payer: Self-pay | Admitting: Internal Medicine

## 2021-02-01 NOTE — Telephone Encounter (Signed)
Patient is requesting a refill of the following medications: Requested Prescriptions   Pending Prescriptions Disp Refills   LORazepam (ATIVAN) 0.5 MG tablet [Pharmacy Med Name: LORAZEPAM 0.5 MG TABLET] 30 tablet 1    Sig: TAKE 1/2 TABLET BY MOUTH DAILY AS NEEDED FOR ANXIETY    Date of patient request: 02/01/21  Last office visit: 12/28/20   Date of last refill: 09/12/20 Last refill amount: 30,1 Follow up time period per chart: 07/04/21

## 2021-02-08 ENCOUNTER — Other Ambulatory Visit: Payer: Self-pay

## 2021-02-08 ENCOUNTER — Ambulatory Visit (INDEPENDENT_AMBULATORY_CARE_PROVIDER_SITE_OTHER): Payer: Medicare Other | Admitting: Obstetrics & Gynecology

## 2021-02-08 ENCOUNTER — Encounter: Payer: Self-pay | Admitting: Obstetrics & Gynecology

## 2021-02-08 VITALS — BP 140/80 | HR 83 | Resp 16

## 2021-02-08 DIAGNOSIS — T8389XA Other specified complication of genitourinary prosthetic devices, implants and grafts, initial encounter: Secondary | ICD-10-CM

## 2021-02-08 DIAGNOSIS — N898 Other specified noninflammatory disorders of vagina: Secondary | ICD-10-CM | POA: Diagnosis not present

## 2021-02-08 LAB — WET PREP FOR TRICH, YEAST, CLUE

## 2021-02-08 NOTE — Progress Notes (Signed)
    [name] 02-15-1928 [id number]        87 y.o.  [id number]   RP: Vaginal odor with discharge  HPI: Pessary, last maintenance was in 10/2020.  Vaginal odor with a mild discharge.  No pelvic pain.  No UTI Sx.  No fever.   OB History  Gravida Para Term Preterm AB Living  '4 2     2 2  '$ SAB IAB Ectopic Multiple Live Births  2            # Outcome Date GA Lbr Len/2nd Weight Sex Delivery Anes PTL Lv  4 SAB           3 SAB           2 Para           1 Para             Past medical history,surgical history, problem list, medications, allergies, family history and social history were all reviewed and documented in the EPIC chart.   Directed ROS with pertinent positives and negatives documented in the history of present illness/assessment and plan.  Exam:  Vitals:   02/08/21 1213  BP: 140/80  Pulse: 83  Resp: 16   General appearance:  Normal  Abdomen: Normal  Gynecologic exam: Vulva normal.  Pessary removed and cleaned.  Yellowish d/c present.  Speculum:  Vagina with areas of erythema.  No ulcer.  Wet prep done.  Wet prep:  Negative   Assessment/Plan:  87 y.o. [id number]   1. Vaginal discharge Vaginal discharge because of pessary which is causing inflammation.  Wet prep negative.  Decision to leave the pessary out to let the vaginal mucosa heal.  Follow up in 2 weeks.  2. Vaginal odor Vaginal odor from discharge secondary to pessary.  Wet prep negative.  3. Vaginal irritation from pessary ([place])  Pessary removed.  Erythematous areas of the vaginal mucosa.  No ulceration.  Will let the vaginal mucosa heal without pessary for the coming 2 weeks.  F/U in 2 weeks to reassess.  Princess Bruins MD, 12:19 PM 02/08/2021

## 2021-02-10 ENCOUNTER — Encounter: Payer: Self-pay | Admitting: Obstetrics & Gynecology

## 2021-02-13 ENCOUNTER — Telehealth: Payer: Self-pay | Admitting: Interventional Cardiology

## 2021-02-13 NOTE — Telephone Encounter (Signed)
I spoke with patient. She had slight amount of bleeding from one nostril a couple days last week and yesterday. None today.  Not pouring out and stops on its own. She does not have any nasal drainage, cold or allergy symptoms.  I advised her to continue Eliquis  and let us know if bleeding increased or became more frequent.  She is aware to go to Urgent Care if bleeding reoccurs and she is unable to get it to stop

## 2021-02-13 NOTE — Telephone Encounter (Signed)
Pt c/o medication issue:  1. Name of Medication:  ELIQUIS 5 MG TABS tablet [id number]  2. How are you currently taking this medication (dosage and times per day)? 5 mg 2 times a day   3. Are you having a reaction (difficulty breathing--STAT)? Na   4. What is your medication issue? Pt stated she is having a few little nose Bleeds.  She stated she had 2 last week.  She stated it was not bleeding a lot but she would like to know if Dr Irish Lack, would like her to keep taking this med everyday   Best number [phone]

## 2021-02-15 NOTE — Telephone Encounter (Signed)
Jettie Booze, MD  I agree

## 2021-03-01 ENCOUNTER — Ambulatory Visit (INDEPENDENT_AMBULATORY_CARE_PROVIDER_SITE_OTHER): Payer: Medicare Other | Admitting: Obstetrics & Gynecology

## 2021-03-01 ENCOUNTER — Encounter: Payer: Self-pay | Admitting: Obstetrics & Gynecology

## 2021-03-01 ENCOUNTER — Other Ambulatory Visit: Payer: Self-pay

## 2021-03-01 VITALS — BP 120/78

## 2021-03-01 DIAGNOSIS — Z4689 Encounter for fitting and adjustment of other specified devices: Secondary | ICD-10-CM | POA: Diagnosis not present

## 2021-03-01 DIAGNOSIS — Z23 Encounter for immunization: Secondary | ICD-10-CM | POA: Diagnosis not present

## 2021-03-01 NOTE — Progress Notes (Signed)
    [name] [birth year] [id number]        87 y.o.  [id number]   RP: F/U Vaginal Inflammation d/t Pessary  HPI: Milex ring #5 with support was too large and causing pressure on with vaginal mucosa with inflammation and increased discharge.  Pessary removed since 02/08/2021.  No longer having a vaginal discharge.  No vaginal bleeding.  No pain.  Bulge of the anterior vagina is making hard for patient to pass urine.   OB History  Gravida Para Term Preterm AB Living  _0 SAB IAB Ectopic Multiple Live Births  2            # Outcome Date GA Lbr Len/2nd Weight Sex Delivery Anes PTL Lv  4 SAB           3 SAB           2 Para           1 Para             Past medical history,surgical history, problem list, medications, allergies, family history and social history were all reviewed and documented in the EPIC chart.   Directed ROS with pertinent positives and negatives documented in the history of present illness/assessment and plan.  Exam:  Vitals:   03/01/21 1158  BP: 120/78   General appearance:  Normal  Abdomen: Normal  Gynecologic exam: Vulva normal.  Vaginal mucosa well healed, intact.  Milex #5 ring is too large.  Milex #4 ring with support from kit, inserted in the vagina, it is a much better fit.  Put in place easily.  Comfortable to patient.  Didn't fall off after walking around. Pessary from kit removed.  New pessary Milex #4 ring with support inserted in the vagina.   Assessment/Plan:  87 y.o. [id number]   1. Encounter for fitting and adjustment of pessary Vaginal mucosa inflammation resolved with the pessary out since February 08, 2021.  Pessary fitting and adjustment done today.  Milex ring #4 with support is a much better fit.  We had a pessary of that size in the office.  Pessary inserted in the vagina.  Patient will follow-up in 4 months.  2. Need for immunization against influenza - Flu Vaccine QUAD High Dose(Fluad)   Princess Bruins MD, 12:46 PM  03/01/2021

## 2021-03-03 ENCOUNTER — Other Ambulatory Visit: Payer: Self-pay | Admitting: Internal Medicine

## 2021-03-06 DIAGNOSIS — H353132 Nonexudative age-related macular degeneration, bilateral, intermediate dry stage: Secondary | ICD-10-CM | POA: Diagnosis not present

## 2021-03-06 DIAGNOSIS — H04123 Dry eye syndrome of bilateral lacrimal glands: Secondary | ICD-10-CM | POA: Diagnosis not present

## 2021-03-06 DIAGNOSIS — H35033 Hypertensive retinopathy, bilateral: Secondary | ICD-10-CM | POA: Diagnosis not present

## 2021-04-12 ENCOUNTER — Other Ambulatory Visit: Payer: Self-pay | Admitting: Internal Medicine

## 2021-04-25 ENCOUNTER — Ambulatory Visit (INDEPENDENT_AMBULATORY_CARE_PROVIDER_SITE_OTHER): Payer: Medicare Other | Admitting: Obstetrics & Gynecology

## 2021-04-25 ENCOUNTER — Encounter: Payer: Self-pay | Admitting: Obstetrics & Gynecology

## 2021-04-25 ENCOUNTER — Ambulatory Visit: Payer: Medicare Other | Admitting: Obstetrics & Gynecology

## 2021-04-25 ENCOUNTER — Other Ambulatory Visit: Payer: Self-pay

## 2021-04-25 VITALS — BP 118/70

## 2021-04-25 DIAGNOSIS — N898 Other specified noninflammatory disorders of vagina: Secondary | ICD-10-CM | POA: Diagnosis not present

## 2021-04-25 DIAGNOSIS — Z23 Encounter for immunization: Secondary | ICD-10-CM | POA: Diagnosis not present

## 2021-04-25 DIAGNOSIS — T8389XA Other specified complication of genitourinary prosthetic devices, implants and grafts, initial encounter: Secondary | ICD-10-CM | POA: Diagnosis not present

## 2021-04-25 LAB — WET PREP FOR TRICH, YEAST, CLUE

## 2021-04-25 MED ORDER — TINIDAZOLE 500 MG PO TABS: 1000.0000 mg | ORAL_TABLET | Freq: Two times a day (BID) | ORAL | 0 refills | Status: AC

## 2021-04-25 NOTE — Progress Notes (Signed)
    SAFIRE GORDIN [birth year] [id number]        87 y.o.  [id number]   RP: Pinkish vaginal d/c with odor  HPI: Pinkish vaginal d/c with odor.  New pessary Milex ring #4 with support inserted at last visit 03/01/2021.  This was a decrease in size from a #5 which was causing vaginal mucosa inflammation.   OB History  Gravida Para Term Preterm AB Living  4 2     2 2   SAB IAB Ectopic Multiple Live Births  2            # Outcome Date GA Lbr Len/2nd Weight Sex Delivery Anes PTL Lv  4 SAB           3 SAB           2 Para           1 Para             Past medical history,surgical history, problem list, medications, allergies, family history and social history were all reviewed and documented in the EPIC chart.   Directed ROS with pertinent positives and negatives documented in the history of present illness/assessment and plan.  Exam:  Vitals:   04/25/21 1014  BP: 118/70   General appearance:  Normal  Gynecologic exam: Vulva normal.  Pessary Milex ring #4 with support removed.  Yellow d/c and mild blood present.  Wet prep done.  Speculum:  erythema/mild bleeding from vaginal mucosa posterior to cervix.  Wet prep:  Clue cells with odor present   Assessment/Plan:  87 y.o. [id number]   1. Vaginal irritation from pessary [facility]) Probably left over vaginal mucosa irritation from the previous larger pessary.  Decision to leave the current well fitted pessary out x 1 week to allow healing.  F/U 1 week to reinsert pessary if the vaginal mucosa is well healed. - WET PREP FOR TRICH, YEAST, CLUE  2. Vaginal discharge Bacterial vaginosis present, will treat with Tinidazole.  Usage reviewed and prescription sent to pharmacy. - WET PREP FOR [place], YEAST, CLUE    Other orders - tinidazole (TINDAMAX) 500 MG tablet; Take 2 tablets (1,000 mg total) by mouth 2 (two) times daily for 2 days.   Princess Bruins MD, 10:50 AM 04/25/2021

## 2021-04-26 ENCOUNTER — Encounter: Payer: Self-pay | Admitting: Obstetrics & Gynecology

## 2021-05-05 ENCOUNTER — Encounter: Payer: Self-pay | Admitting: Obstetrics & Gynecology

## 2021-05-05 ENCOUNTER — Ambulatory Visit (INDEPENDENT_AMBULATORY_CARE_PROVIDER_SITE_OTHER): Payer: Medicare Other | Admitting: Obstetrics & Gynecology

## 2021-05-05 ENCOUNTER — Other Ambulatory Visit: Payer: Self-pay

## 2021-05-05 VITALS — BP 120/78

## 2021-05-05 DIAGNOSIS — T8389XA Other specified complication of genitourinary prosthetic devices, implants and grafts, initial encounter: Secondary | ICD-10-CM | POA: Diagnosis not present

## 2021-05-05 DIAGNOSIS — N898 Other specified noninflammatory disorders of vagina: Secondary | ICD-10-CM

## 2021-05-05 NOTE — Progress Notes (Signed)
    [name] [birth year] [id number]        87 y.o.  [id number]   RP: F/U vaginal irritation from pessary  HPI: Removed the pessary x 1 week and treated BV with Tinidazole.  No longer having vaginal bleeding or discharge.  No vaginal pain.  Difficulty emptying her bladder when the pessary is out.   OB History  Gravida Para Term Preterm AB Living  4 2     2 2   SAB IAB Ectopic Multiple Live Births  2            # Outcome Date GA Lbr Len/2nd Weight Sex Delivery Anes PTL Lv  4 SAB           3 SAB           2 Para           1 Para             Past medical history,surgical history, problem list, medications, allergies, family history and social history were all reviewed and documented in the EPIC chart.   Directed ROS with pertinent positives and negatives documented in the history of present illness/assessment and plan.  Exam:  Vitals:   05/05/21 1601  BP: 120/78   General appearance:  Normal   Gynecologic exam: Vulva normal.  Vaginal mucosa normal with no erythema.  Normal secretions, no blood.  Pessary easily put back in place vaginally.   Assessment/Plan:  87 y.o. [id number]   1. Vaginal irritation from pessary ([facility])  Removed the pessary x 1 week and treated BV with Tinidazole.  No longer having vaginal bleeding or discharge.  No vaginal pain.  Difficulty emptying her bladder when the pessary is out.  Vaginal mucosa and secretions normal on exam.  Pessary put back in place easily.  Will f/u in 4 months for pessary maintenance.  Princess Bruins MD, 4:15 PM 05/05/2021

## 2021-05-10 DIAGNOSIS — D0439 Carcinoma in situ of skin of other parts of face: Secondary | ICD-10-CM | POA: Diagnosis not present

## 2021-05-10 DIAGNOSIS — L570 Actinic keratosis: Secondary | ICD-10-CM | POA: Diagnosis not present

## 2021-05-10 DIAGNOSIS — L821 Other seborrheic keratosis: Secondary | ICD-10-CM | POA: Diagnosis not present

## 2021-05-10 DIAGNOSIS — Z85828 Personal history of other malignant neoplasm of skin: Secondary | ICD-10-CM | POA: Diagnosis not present

## 2021-05-10 DIAGNOSIS — D1801 Hemangioma of skin and subcutaneous tissue: Secondary | ICD-10-CM | POA: Diagnosis not present

## 2021-05-10 DIAGNOSIS — D2272 Melanocytic nevi of left lower limb, including hip: Secondary | ICD-10-CM | POA: Diagnosis not present

## 2021-05-10 DIAGNOSIS — D485 Neoplasm of uncertain behavior of skin: Secondary | ICD-10-CM | POA: Diagnosis not present

## 2021-05-10 DIAGNOSIS — B078 Other viral warts: Secondary | ICD-10-CM | POA: Diagnosis not present

## 2021-05-10 DIAGNOSIS — L814 Other melanin hyperpigmentation: Secondary | ICD-10-CM | POA: Diagnosis not present

## 2021-05-31 ENCOUNTER — Other Ambulatory Visit: Payer: Self-pay | Admitting: Internal Medicine

## 2021-06-05 DIAGNOSIS — U071 COVID-19: Secondary | ICD-10-CM | POA: Diagnosis not present

## 2021-06-20 DIAGNOSIS — L570 Actinic keratosis: Secondary | ICD-10-CM | POA: Diagnosis not present

## 2021-06-20 DIAGNOSIS — L820 Inflamed seborrheic keratosis: Secondary | ICD-10-CM | POA: Diagnosis not present

## 2021-06-20 DIAGNOSIS — D0439 Carcinoma in situ of skin of other parts of face: Secondary | ICD-10-CM | POA: Diagnosis not present

## 2021-06-20 DIAGNOSIS — Z85828 Personal history of other malignant neoplasm of skin: Secondary | ICD-10-CM | POA: Diagnosis not present

## 2021-06-22 ENCOUNTER — Other Ambulatory Visit: Payer: Self-pay | Admitting: Internal Medicine

## 2021-06-30 ENCOUNTER — Other Ambulatory Visit: Payer: Self-pay | Admitting: Interventional Cardiology

## 2021-06-30 ENCOUNTER — Ambulatory Visit: Payer: Medicare Other | Admitting: Obstetrics & Gynecology

## 2021-06-30 NOTE — Telephone Encounter (Signed)
Prescription refill request for Eliquis received. Indication: Afib  Last office visit: 01/12/21 Irish Lack)  Scr: 1.12 (12/28/20)  Age: 88 Weight: 65.4kg  Appropriate dose and refill sent to requested pharmacy.

## 2021-07-03 NOTE — Patient Instructions (Addendum)
° ° ° ° °  Blood work was ordered.      Medications changes include :   take the pepcid nightly     Please followup in 6 months

## 2021-07-03 NOTE — Progress Notes (Signed)
Subjective:    Patient ID: [name], female    DOB: May 07, 1928, 88 y.o.   MRN: [id number]  This visit occurred during the SARS-CoV-2 public health emergency.  Safety protocols were in place, including screening questions prior to the visit, additional usage of staff PPE, and extensive cleaning of exam room while observing appropriate contact time as indicated for disinfecting solutions.     HPI The patient is here for follow up of their chronic medical problems, including afib, htn, hypothyroid, gerd, iron def anemia, anxiety, ckd, vit d def  First thing in the morning she feels a little nauseous.  She burps some and that helps.    Medications and allergies reviewed with patient and updated if appropriate.  Patient Active Problem List   Diagnosis Date Noted   Aortic atherosclerosis ([place]) 07/01/2020   Female bladder prolapse 11/15/2017   Decreased hearing of left ear 11/15/2017   Iron deficiency anemia 09/10/2017   Shortness of breath 09/10/2017   Persistent atrial fibrillation ([employer]) 08/20/2016   GERD (gastroesophageal reflux disease) 02/20/2016   Right knee pain 01/20/2016   Left hip pain 01/20/2016   Peripheral edema 01/60/1093   Systolic murmur 23/55/7322   Anxiety 07/18/2013   Nodule of left lung 07/18/2013   Apical variant hypertrophic cardiomyopathy ([place]) 08/23/2009   Vitamin D deficiency 01/21/2009   Chronic kidney disease (CKD), stage III (moderate) ([place]) 01/21/2009   MYCOSIS FUNGOIDES 05/21/2008   DIVERTICULOSIS, COLON 05/21/2008   OSTEOARTHRITIS 06/02/2007   Hypothyroidism 04/28/2007   HYPERLIPIDEMIA 04/28/2007   Essential hypertension 04/28/2007   Osteoporosis 04/28/2007   FIBROMYALGIA 11/14/2006    Current Outpatient Medications on File Prior to Visit  Medication Sig Dispense Refill   beta carotene w/minerals (OCUVITE) tablet Take 1 tablet by mouth daily.     carvedilol (COREG) 6.25 MG tablet Take 1 tablet (6.25 mg total) by mouth 2 (two) times  daily. 180 tablet 3   Cholecalciferol 50 MCG (2000 UT) CAPS Take 1 capsule (2,000 Units total) by mouth daily. 30 capsule    ELIQUIS 5 MG TABS tablet TAKE 1 TABLET BY MOUTH TWICE A DAY 60 tablet 5   furosemide (LASIX) 20 MG tablet Take 1 tablet (20 mg total) by mouth daily as needed. 30 tablet 11   levothyroxine (SYNTHROID) 75 MCG tablet TAKE 1 TABLET BY MOUTH DAILY EXCEPT TAKE 1/2 TABLET ON TUESDAYS AND SATURDAY 72 tablet 0   lisinopril (ZESTRIL) 40 MG tablet Take 1 tablet (40 mg total) by mouth daily. 90 tablet 3   LORazepam (ATIVAN) 0.5 MG tablet TAKE 1/2 TABLET BY MOUTH DAILY AS NEEDED FOR ANXIETY 30 tablet 1   omeprazole (PRILOSEC) 40 MG capsule TAKE 1 CAPSULE BY MOUTH EVERY DAY 90 capsule 3   vitamin B-12 (CYANOCOBALAMIN) 1000 MCG tablet Take 1 tablet (1,000 mcg total) by mouth daily.     No current facility-administered medications on file prior to visit.    Past Medical History:  Diagnosis Date   Adrenal myelolipoma    Apical variant hypertrophic cardiomyopathy ([place])    Echo 4/18: Normal LV size with marked apical hypertrophy. LVEF 60-65%. No WMA. Mild MR. Moderate left atrial enlargement. Normal RV size and function. Mild right atrial enlargemen   Chronic kidney disease    DIVERTICULOSIS, COLON    Dysmetabolic syndrome X    FIBROMYALGIA    Gastric ulcer due to Helicobacter pylori    Hiatal hernia    Hx of cataract surgery 01/08/2016   HYPERLIPIDEMIA  Hyperplastic colon polyp    HYPERTENSION, ESSENTIAL NOS    Hyponatremia    Hypothyroidism    MYCOSIS FUNGOIDES    Nodule of left lung    Normocytic anemia    Olecranon fracture 01/07/2016   OSTEOARTHRITIS    OSTEOPOROSIS    Persistent atrial fibrillation ([place])    s/p DCCV 2018 // Apixaban for anticoag   Renal angiomyolipoma    Unspecified vitamin D deficiency     Past Surgical History:  Procedure Laterality Date   CARDIOVERSION N/A 01/03/2017   Procedure: CARDIOVERSION;  Surgeon: Josue Hector, MD;  Location: [facility]  ENDOSCOPY;  Service: Cardiovascular;  Laterality: N/A;   colonoscopy with polypectomy  07/25/2007   Dr Olevia Perches, Recheck in 7 years for 2009   G 4 P 2     I & D EXTREMITY Right 01/08/2016   Procedure: IRRIGATION AND DEBRIDEMENT OF OLECRANON FRACTURE;  Surgeon: Renette Butters, MD;  Location: [facility];  Service: Orthopedics;  Laterality: Right;   ORIF ELBOW FRACTURE Right 01/08/2016   Procedure: OPEN REDUCTION INTERNAL FIXATION (ORIF) ELBOW/OLECRANON FRACTURE, NEUROLYSIS;  Surgeon: Renette Butters, MD;  Location: [place];  Service: Orthopedics;  Laterality: Right;    Social History   Socioeconomic History   Marital status: Widowed    Spouse name: Not on file   Number of children: 2   Years of education: Not on file   Highest education level: Not on file  Occupational History   Occupation: Retired    Fish farm manager: RETIRED  Tobacco Use   Smoking status: Former    Packs/day: 0.50    Years: 14.00    Pack years: 7.00    Types: Cigarettes    Quit date: 1956    Years since quitting: 67.1   Smokeless tobacco: Never   Tobacco comments:    smoked 1951-1965 , up to 1/2 ppd  Vaping Use   Vaping Use: Never used  Substance and Sexual Activity   Alcohol use: No    Alcohol/week: 0.0 standard drinks   Drug use: No   Sexual activity: Not Currently    Partners: Male    Birth control/protection: Post-menopausal  Other Topics Concern   Not on file  Social History Narrative   Widowed.  Lives alone.  Ambulates without assistance.  Steady on feet.   Social Determinants of Health   Financial Resource Strain: Not on file  Food Insecurity: Not on file  Transportation Needs: Not on file  Physical Activity: Not on file  Stress: Not on file  Social Connections: Not on file    Family History  Problem Relation Age of Onset   Diabetes Mother    Heart attack Maternal Uncle 85       his 2 sons had MI in 41s   Hypertension Maternal Grandmother    Stroke Maternal Grandmother 69    Review of Systems   Constitutional:  Negative for appetite change and fever.  Respiratory:  Positive for shortness of breath (with exertion). Negative for cough and wheezing.   Cardiovascular:  Positive for leg swelling. Negative for chest pain and palpitations.  Musculoskeletal:  Positive for arthralgias.  Neurological:  Negative for light-headedness and headaches.  Psychiatric/Behavioral:  Negative for sleep disturbance. The patient is nervous/anxious (mild at times).       Objective:   Vitals:   07/04/21 1305  BP: 120/74  Pulse: 82  Temp: 98.3 F (36.8 C)  SpO2: 98%   BP Readings from Last 3 Encounters:  07/04/21 120/74  05/05/21 120/78  04/25/21 118/70   Wt Readings from Last 3 Encounters:  01/12/21 144 lb 3.2 oz (65.4 kg)  12/28/20 146 lb (66.2 kg)  09/27/20 143 lb 12.8 oz (65.2 kg)   There is no height or weight on file to calculate BMI.   Physical Exam    Constitutional: Appears well-developed and well-nourished. No distress.  HENT:  Head: Normocephalic and atraumatic.  Neck: Neck supple. No tracheal deviation present. No thyromegaly present.  No cervical lymphadenopathy Cardiovascular: Normal rate, regular rhythm and normal heart sounds.   No murmur heard. No carotid bruit .  No edema Pulmonary/Chest: Effort normal and breath sounds normal. No respiratory distress. No has no wheezes. No rales.  Skin: Skin is warm and dry. Not diaphoretic.  Psychiatric: Normal mood and affect. Behavior is normal.      Assessment & Plan:    See Problem List for Assessment and Plan of chronic medical problems.

## 2021-07-04 ENCOUNTER — Other Ambulatory Visit: Payer: Self-pay

## 2021-07-04 ENCOUNTER — Encounter: Payer: Self-pay | Admitting: Internal Medicine

## 2021-07-04 ENCOUNTER — Ambulatory Visit (INDEPENDENT_AMBULATORY_CARE_PROVIDER_SITE_OTHER): Payer: Medicare Other | Admitting: Internal Medicine

## 2021-07-04 VITALS — BP 120/74 | HR 82 | Temp 98.3°F

## 2021-07-04 DIAGNOSIS — I4819 Other persistent atrial fibrillation: Secondary | ICD-10-CM

## 2021-07-04 DIAGNOSIS — K219 Gastro-esophageal reflux disease without esophagitis: Secondary | ICD-10-CM | POA: Diagnosis not present

## 2021-07-04 DIAGNOSIS — E782 Mixed hyperlipidemia: Secondary | ICD-10-CM | POA: Diagnosis not present

## 2021-07-04 DIAGNOSIS — F419 Anxiety disorder, unspecified: Secondary | ICD-10-CM | POA: Diagnosis not present

## 2021-07-04 DIAGNOSIS — D509 Iron deficiency anemia, unspecified: Secondary | ICD-10-CM | POA: Diagnosis not present

## 2021-07-04 DIAGNOSIS — E559 Vitamin D deficiency, unspecified: Secondary | ICD-10-CM | POA: Diagnosis not present

## 2021-07-04 DIAGNOSIS — I10 Essential (primary) hypertension: Secondary | ICD-10-CM | POA: Diagnosis not present

## 2021-07-04 DIAGNOSIS — N1831 Chronic kidney disease, stage 3a: Secondary | ICD-10-CM

## 2021-07-04 DIAGNOSIS — E038 Other specified hypothyroidism: Secondary | ICD-10-CM

## 2021-07-04 LAB — CBC WITH DIFFERENTIAL/PLATELET
Basophils Absolute: 0 10*3/uL (ref 0.0–0.1)
Basophils Relative: 0.7 % (ref 0.0–3.0)
Eosinophils Absolute: 0.1 10*3/uL (ref 0.0–0.7)
Eosinophils Relative: 1.8 % (ref 0.0–5.0)
HCT: 36.1 % (ref 36.0–46.0)
Hemoglobin: 11.9 g/dL — ABNORMAL LOW (ref 12.0–15.0)
Lymphocytes Relative: 23.5 % (ref 12.0–46.0)
Lymphs Abs: 1.5 10*3/uL (ref 0.7–4.0)
MCHC: 32.9 g/dL (ref 30.0–36.0)
MCV: 94.5 fl (ref 78.0–100.0)
Monocytes Absolute: 0.5 10*3/uL (ref 0.1–1.0)
Monocytes Relative: 7.9 % (ref 3.0–12.0)
Neutro Abs: 4.2 10*3/uL (ref 1.4–7.7)
Neutrophils Relative %: 66.1 % (ref 43.0–77.0)
Platelets: 225 10*3/uL (ref 150.0–400.0)
RBC: 3.82 Mil/uL — ABNORMAL LOW (ref 3.87–5.11)
RDW: 13.6 % (ref 11.5–15.5)
WBC: 6.3 10*3/uL (ref 4.0–10.5)

## 2021-07-04 LAB — IBC PANEL
Iron: 63 ug/dL (ref 42–145)
Saturation Ratios: 17.6 % — ABNORMAL LOW (ref 20.0–50.0)
TIBC: 358.4 ug/dL (ref 250.0–450.0)
Transferrin: 256 mg/dL (ref 212.0–360.0)

## 2021-07-04 LAB — COMPREHENSIVE METABOLIC PANEL
ALT: 9 U/L (ref 0–35)
AST: 13 U/L (ref 0–37)
Albumin: 4.1 g/dL (ref 3.5–5.2)
Alkaline Phosphatase: 63 U/L (ref 39–117)
BUN: 18 mg/dL (ref 6–23)
CO2: 26 mEq/L (ref 19–32)
Calcium: 9.4 mg/dL (ref 8.4–10.5)
Chloride: 108 mEq/L (ref 96–112)
Creatinine, Ser: 1.21 mg/dL — ABNORMAL HIGH (ref 0.40–1.20)
GFR: 39.2 mL/min — ABNORMAL LOW (ref >60.00)
Glucose, Bld: 111 mg/dL — ABNORMAL HIGH (ref 70–99)
Potassium: 4.4 mEq/L (ref 3.5–5.1)
Sodium: 140 mEq/L (ref 135–145)
Total Bilirubin: 0.4 mg/dL (ref 0.2–1.2)
Total Protein: 6.7 g/dL (ref 6.0–8.3)

## 2021-07-04 NOTE — Assessment & Plan Note (Addendum)
Chronic Not ideally controlled - having nausea and burping first thing in the morning Start pepcid 20 mg at bedtime.  Continue omeprazole 40 mg daily

## 2021-07-04 NOTE — Assessment & Plan Note (Addendum)
Chronic History of iron deficiency anemia and currently on Eliquis for atrial fibrillation Taking iron daily Check CBC, iron panel

## 2021-07-04 NOTE — Assessment & Plan Note (Signed)
Chronic Not on a statin due to statin intolerance

## 2021-07-04 NOTE — Assessment & Plan Note (Signed)
Chronic  Clinically euthyroid Currently taking levothyroxine 75 mcg 5 days a week, 37.5 mcg twice a week Check tsh  Titrate med dose if needed

## 2021-07-04 NOTE — Assessment & Plan Note (Signed)
Chronic Vitamin D level has been in the normal range Continue vitamin D 2000 units daily

## 2021-07-04 NOTE — Assessment & Plan Note (Signed)
Chronic Following with cardiology Continue carvedilol 6.25 mg twice daily, Eliquis 5 mg twice daily

## 2021-07-04 NOTE — Assessment & Plan Note (Signed)
Chronic Controlled, Stable Continue lorazepam 0.25 mg daily as needed

## 2021-07-04 NOTE — Assessment & Plan Note (Signed)
Chronic Blood pressure well controlled CMP Continue Coreg 6.25 mg twice daily, lisinopril 40 mg daily 

## 2021-07-04 NOTE — Assessment & Plan Note (Signed)
Chronic CMP 

## 2021-07-05 DIAGNOSIS — M1711 Unilateral primary osteoarthritis, right knee: Secondary | ICD-10-CM | POA: Diagnosis not present

## 2021-07-05 DIAGNOSIS — M25561 Pain in right knee: Secondary | ICD-10-CM | POA: Diagnosis not present

## 2021-07-05 LAB — TSH: TSH: 0.77 u[IU]/mL (ref 0.35–5.50)

## 2021-08-22 ENCOUNTER — Ambulatory Visit (INDEPENDENT_AMBULATORY_CARE_PROVIDER_SITE_OTHER): Payer: Medicare Other | Admitting: Obstetrics & Gynecology

## 2021-08-22 ENCOUNTER — Encounter: Payer: Self-pay | Admitting: Obstetrics & Gynecology

## 2021-08-22 ENCOUNTER — Other Ambulatory Visit: Payer: Self-pay

## 2021-08-22 VITALS — BP 118/76 | HR 64

## 2021-08-22 DIAGNOSIS — Z4689 Encounter for fitting and adjustment of other specified devices: Secondary | ICD-10-CM | POA: Diagnosis not present

## 2021-08-22 DIAGNOSIS — N898 Other specified noninflammatory disorders of vagina: Secondary | ICD-10-CM | POA: Diagnosis not present

## 2021-08-22 LAB — WET PREP FOR TRICH, YEAST, CLUE

## 2021-08-22 NOTE — Progress Notes (Signed)
? ? ?  [name] [birth year] [id number] ? ? ?     88 y.o.  [id number]  ? ?RP: Brown/pink discharge with pessary ? ?HPI: Mild Brown/pink discharge x a few days.  Well with Milex #4 Ring with support otherwise.  No pelvic, vaginal or vulvar pain.  Urine and BMs wnl. ? ? ?OB History  ?Gravida Para Term Preterm AB Living  ?'4 2     2 2  '$ ?SAB IAB Ectopic Multiple Live Births  ?2          ?  ?# Outcome Date GA Lbr Len/2nd Weight Sex Delivery Anes PTL Lv  ?4 SAB           ?3 SAB           ?2 Para           ?1 Para           ? ? ?Past medical history,surgical history, problem list, medications, allergies, family history and social history were all reviewed and documented in the EPIC chart. ? ? ?Directed ROS with pertinent positives and negatives documented in the history of present illness/assessment and plan. ? ?Exam: ? ?Vitals:  ? 08/22/21 1450  ?BP: 118/76  ?Pulse: 64  ? ?General appearance:  Normal ? ? ?Gynecologic exam: Vulva normal.  Wet prep done.  Pessary removed.  Vaginal mucosa intact.  Pessary cleaned and reinserted.   ? ?Wet prep Neg ? ? ?Assessment/Plan:  88 y.o. [id number]  ? ?1. Vaginal discharge ?Wet prep Negative.  Discharge d/t pessary wnl.  Vaginal mucosa intact.  Good fit with Milex #4 Ring with support.  Patient reassured.  F/U in 3 months for pessary maintenance. ?- WET PREP FOR [place], YEAST, CLUE ? ?2. Pessary maintenance  ?Good fit with Milex #4 Ring with support.  Patient reassured.  F/U in 3 months for pessary maintenance. ? ?Amber Bruins MD, 3:15 PM 08/22/2021 ? ? ? ?  ?

## 2021-08-30 ENCOUNTER — Other Ambulatory Visit: Payer: Self-pay | Admitting: Internal Medicine

## 2021-09-26 ENCOUNTER — Other Ambulatory Visit: Payer: Self-pay | Admitting: Interventional Cardiology

## 2021-09-27 ENCOUNTER — Other Ambulatory Visit: Payer: Self-pay | Admitting: Interventional Cardiology

## 2021-09-27 ENCOUNTER — Other Ambulatory Visit: Payer: Self-pay | Admitting: Internal Medicine

## 2021-09-28 ENCOUNTER — Ambulatory Visit: Payer: Medicare Other | Admitting: Obstetrics & Gynecology

## 2021-10-10 ENCOUNTER — Ambulatory Visit: Payer: Medicare Other | Admitting: Interventional Cardiology

## 2021-10-25 ENCOUNTER — Other Ambulatory Visit: Payer: Self-pay | Admitting: Internal Medicine

## 2021-10-25 NOTE — Telephone Encounter (Signed)
Check [place] registry last filled 08/30/2021.Marland KitchenJohny Chess

## 2021-11-22 ENCOUNTER — Encounter: Payer: Self-pay | Admitting: Obstetrics & Gynecology

## 2021-11-22 ENCOUNTER — Ambulatory Visit (INDEPENDENT_AMBULATORY_CARE_PROVIDER_SITE_OTHER): Payer: Medicare Other | Admitting: Obstetrics & Gynecology

## 2021-11-22 VITALS — BP 110/70 | HR 68 | Resp 14

## 2021-11-22 DIAGNOSIS — Z4689 Encounter for fitting and adjustment of other specified devices: Secondary | ICD-10-CM

## 2021-11-22 DIAGNOSIS — N898 Other specified noninflammatory disorders of vagina: Secondary | ICD-10-CM | POA: Diagnosis not present

## 2021-11-22 NOTE — Progress Notes (Signed)
    TASHA DIAZ June 17, 1927 [id number]        88 y.o.  G4P2A2L2  RP: Pessary maintenance  HPI: Continued vaginal discharge with some odor, occasionally pink.  Well with Milex #4 Ring with support otherwise.  No pelvic, vaginal or vulvar pain.  Urine and BMs wnl.  No fever.     OB History  Gravida Para Term Preterm AB Living  '4 2     2 2  '$ SAB IAB Ectopic Multiple Live Births  2            # Outcome Date GA Lbr Len/2nd Weight Sex Delivery Anes PTL Lv  4 SAB           3 SAB           2 Para           1 Para             Past medical history,surgical history, problem list, medications, allergies, family history and social history were all reviewed and documented in the EPIC chart.   Directed ROS with pertinent positives and negatives documented in the history of present illness/assessment and plan.  Exam:  Vitals:   11/22/21 1430  BP: 110/70  Pulse: 68  Resp: 14   General appearance:  Normal  Gynecologic exam: Vulva normal.  Pessary easily removed and cleaned.  Vaginal mucosa intact.  Vaginal discharge stable.  No bleeding.  Pessary put back in place, good fit.   Assessment/Plan:  88 y.o. G4P2L22   1. Pessary maintenance Stable vaginal discharge. Well with Milex #4 Ring with support otherwise.  No pelvic, vaginal or vulvar pain. Vaginal mucosa intact.  Patient reassured.  F/U pessary maintenance in 4 months.  2. Vaginal discharge  Stable vaginal discharge.  Wet prep negative last visit.  Patient reassured.  Princess Bruins MD, 3:11 PM 11/22/2021

## 2021-12-22 ENCOUNTER — Other Ambulatory Visit: Payer: Self-pay | Admitting: Interventional Cardiology

## 2021-12-27 ENCOUNTER — Other Ambulatory Visit: Payer: Self-pay | Admitting: Interventional Cardiology

## 2021-12-27 DIAGNOSIS — I4819 Other persistent atrial fibrillation: Secondary | ICD-10-CM

## 2021-12-27 NOTE — Telephone Encounter (Signed)
Prescription refill request for Eliquis received. Indication: Afib  Last office visit: 01/12/21 [place]) Scr: 1.21 (07/04/21) Age: 88 Weight: 65.4kg  Appropriate dose and refill sent to requested pharmacy.

## 2022-01-09 ENCOUNTER — Ambulatory Visit: Payer: Medicare Other | Admitting: Internal Medicine

## 2022-01-14 NOTE — Progress Notes (Signed)
Cardiology Office Note   Date:  01/15/2022   ID:  [name], [name] [birth year], MRN [id number]  PCP:  Binnie Rail, MD    No chief complaint on file.  PAF  Wt Readings from Last 3 Encounters:  01/15/22 142 lb 6.4 oz (64.6 kg)  01/12/21 144 lb 3.2 oz (65.4 kg)  12/28/20 146 lb (66.2 kg)       History of Present Illness: [name] is a 88 y.o. female   who previously saw Dr. Saunders Revel.  She has persistent atrial fibrillation, apical variant hypertrophic cardiomyopathy, hypertension, hyperlipidemia, chronic kidney disease, anemia.  She underwent cardioversion in 2018.  Her beta-blocker dose has been reduced due to symptoms of fatigue and dyspnea on exertion.  She was feeling better at her last visit with me in 02/2018.  We decided to try to switch her from a beta-blocker to a calcium channel blocker.  However, she could not tolerate this and was placed back on her beta-blocker.    CHADS2-VASc=4    Noted in the past: "She has had some DOE.  She also has some anxiety when she wakes up, worse with COVID.  She got her vaccine shots.  "In 2021, she noted: "She states she feels sick when she takes the Lasix.  She also reports not having energy.  She has not been exercising much.  Feels like the last year of staying in size has cut down on her activity and her stamina in general. She does not drive.  SHe does well with a cart at a store.  She has some occasional leg swelling, but they are back to normal by normal.  "  Seen in 4/22 and Coreg was increased for better rate control.  Lasix use was also encouraged.  She has felt well.  Rare Lasix use.    [place] once a few years ago in 2021.  Had CT.  None since then.   Denies : Chest pain. Dizziness. Leg edema. Nitroglycerin use. Orthopnea. Palpitations. Paroxysmal nocturnal dyspnea. Shortness of breath. Syncope.    Uses a walker sometimes.   Past Medical History:  Diagnosis Date   Adrenal myelolipoma    Apical variant hypertrophic  cardiomyopathy ([place])    Echo 4/18: Normal LV size with marked apical hypertrophy. LVEF 60-65%. No WMA. Mild MR. Moderate left atrial enlargement. Normal RV size and function. Mild right atrial enlargemen   Chronic kidney disease    DIVERTICULOSIS, COLON    Dysmetabolic syndrome X    FIBROMYALGIA    Gastric ulcer due to Helicobacter pylori    Hiatal hernia    Hx of cataract surgery 01/08/2016   HYPERLIPIDEMIA    Hyperplastic colon polyp    HYPERTENSION, ESSENTIAL NOS    Hyponatremia    Hypothyroidism    MYCOSIS FUNGOIDES    Nodule of left lung    Normocytic anemia    Olecranon fracture 01/07/2016   OSTEOARTHRITIS    OSTEOPOROSIS    Persistent atrial fibrillation ([place])    s/p DCCV 2018 // Apixaban for anticoag   Renal angiomyolipoma    Unspecified vitamin D deficiency     Past Surgical History:  Procedure Laterality Date   CARDIOVERSION N/A 01/03/2017   Procedure: CARDIOVERSION;  Surgeon: Josue Hector, MD;  Location: [facility];  Service: Cardiovascular;  Laterality: N/A;   colonoscopy with polypectomy  07/25/2007   Dr Olevia Perches, Recheck in 7 years for 2009   G 4 P 2     I &  D EXTREMITY Right 01/08/2016   Procedure: IRRIGATION AND DEBRIDEMENT OF OLECRANON FRACTURE;  Surgeon: Renette Butters, MD;  Location: [facility];  Service: Orthopedics;  Laterality: Right;   ORIF ELBOW FRACTURE Right 01/08/2016   Procedure: OPEN REDUCTION INTERNAL FIXATION (ORIF) ELBOW/OLECRANON FRACTURE, NEUROLYSIS;  Surgeon: Renette Butters, MD;  Location: [facility];  Service: Orthopedics;  Laterality: Right;     Current Outpatient Medications  Medication Sig Dispense Refill   carvedilol (COREG) 6.25 MG tablet TAKE 1 TABLET BY MOUTH TWICE A DAY 180 tablet 0   Cholecalciferol 50 MCG (2000 UT) CAPS Take 1 capsule (2,000 Units total) by mouth daily. 30 capsule    ELIQUIS 5 MG TABS tablet TAKE 1 TABLET BY MOUTH TWICE A DAY 60 tablet 5   furosemide (LASIX) 20 MG tablet Take 1 tablet (20 mg total) by mouth daily as  needed. 30 tablet 11   levothyroxine (SYNTHROID) 75 MCG tablet TAKE 1 TABLET BY MOUTH DAILY EXCEPT TAKE 1/2 TABLET ON TUESDAYS AND SATURDAY 72 tablet 1   lisinopril (ZESTRIL) 40 MG tablet TAKE 1 TABLET BY MOUTH EVERY DAY 90 tablet 1   LORazepam (ATIVAN) 0.5 MG tablet TAKE 1/2 TABLET BY MOUTH DAILY AS NEEDED FOR ANXIETY 30 tablet 1   omeprazole (PRILOSEC) 40 MG capsule TAKE 1 CAPSULE BY MOUTH EVERY DAY 90 capsule 3   vitamin B-12 (CYANOCOBALAMIN) 1000 MCG tablet Take 1 tablet (1,000 mcg total) by mouth daily.     beta carotene w/minerals (OCUVITE) tablet Take 1 tablet by mouth daily. (Patient not taking: Reported on 01/15/2022)     No current facility-administered medications for this visit.    Allergies:   Achromycin [tetracycline hcl], Celecoxib, Clarithromycin, Ezetimibe-simvastatin, Flagyl [metronidazole], Penicillins, Simvastatin, Clonazepam, Erythromycin, Sertraline, Tramadol, and Abilify [aripiprazole]    Social History:  The patient  reports that she quit smoking about 67 years ago. Her smoking use included cigarettes. She has a 7.00 pack-year smoking history. She has never used smokeless tobacco. She reports that she does not drink alcohol and does not use drugs.   Family History:  The patient's family history includes Diabetes in her mother; Heart attack (age of onset: 83) in her maternal uncle; Hypertension in her maternal grandmother; Stroke (age of onset: 15) in her maternal grandmother.    ROS:  Please see the history of present illness.   Otherwise, review of systems are positive for fatigue.   All other systems are reviewed and negative.    PHYSICAL EXAM: VS:  BP (!) 146/90   Pulse 82   Ht '4\' 10"'$  (1.473 m)   Wt 142 lb 6.4 oz (64.6 kg)   LMP  (LMP Unknown)   SpO2 98%   BMI 29.76 kg/m  , BMI Body mass index is 29.76 kg/m. GEN: Well nourished, well developed, in no acute distress HEENT: normal Neck: no JVD, carotid bruits, or masses Cardiac: irregularly irregular; no  murmurs, rubs, or gallops,; tr LE edema  Respiratory:  clear to auscultation bilaterally, normal work of breathing GI: soft, nontender, nondistended, + BS MS: no deformity or atrophy Skin: warm and dry, no rash Neuro:  Slow gait- use walker Psych: euthymic mood, full affect   EKG:   The ekg ordered today demonstrates AFib, rate controlled   Recent Labs: 07/04/2021: ALT 9; BUN 18; Creatinine, Ser 1.21; Hemoglobin 11.9; Platelets 225.0; Potassium 4.4; Sodium 140; TSH 0.77   Lipid Panel    Component Value Date/Time   CHOL 221 (H) 07/01/2019 1602   TRIG 173.0 (  H) 07/01/2019 1602   HDL 55.20 07/01/2019 1602   CHOLHDL 4 07/01/2019 1602   VLDL 34.6 07/01/2019 1602   LDLCALC 131 (H) 07/01/2019 1602   LDLDIRECT 128.9 09/09/2012 1129     Other studies Reviewed: Additional studies/ records that were reviewed today with results demonstrating: labs reviewed.   ASSESSMENT AND PLAN:  Apical variant HOCM: Appears euvolemic. No syncope.   Permanent AFib: Rate controlled. Eliquis for stroke prevention.  Needs CBC and BMet at PMD visit next week.  HTN: The current medical regimen is effective;  continue present plan and medications. Hyperlipidemia: statin intolerant.  No longer checking lipids. Avoid falling.   Use walker.  No recent falls.    Current medicines are reviewed at length with the patient today.  The patient concerns regarding her medicines were addressed.  The following changes have been made:  No change  Labs/ tests ordered today include:  No orders of the defined types were placed in this encounter.   Recommend 150 minutes/week of aerobic exercise Low fat, low carb, high fiber diet recommended  Disposition:   FU in 1 year   Signed, Larae Grooms, MD  01/15/2022 2:15 PM    [facility] [place], [place], [place]  [address] Phone: [phone]; Fax: [phone]

## 2022-01-15 ENCOUNTER — Ambulatory Visit (INDEPENDENT_AMBULATORY_CARE_PROVIDER_SITE_OTHER): Payer: Medicare Other | Admitting: Interventional Cardiology

## 2022-01-15 ENCOUNTER — Encounter: Payer: Self-pay | Admitting: Interventional Cardiology

## 2022-01-15 VITALS — BP 126/66 | HR 82 | Ht <= 58 in | Wt 142.4 lb

## 2022-01-15 DIAGNOSIS — I4819 Other persistent atrial fibrillation: Secondary | ICD-10-CM

## 2022-01-15 DIAGNOSIS — I422 Other hypertrophic cardiomyopathy: Secondary | ICD-10-CM | POA: Diagnosis not present

## 2022-01-15 DIAGNOSIS — E782 Mixed hyperlipidemia: Secondary | ICD-10-CM

## 2022-01-15 DIAGNOSIS — I10 Essential (primary) hypertension: Secondary | ICD-10-CM

## 2022-01-15 NOTE — Patient Instructions (Signed)
Medication Instructions:  Your physician recommends that you continue on your current medications as directed. Please refer to the Current Medication list given to you today.  *If you need a refill on your cardiac medications before your next appointment, please call your pharmacy*   Lab Work: PLEASE OBTAIN A CBC AND BMET FROM [facility]  If you have labs (blood work) drawn today and your tests are completely normal, you will receive your results only by: [facility] (if you have MyChart) OR A paper copy in the mail If you have any lab test that is abnormal or we need to change your treatment, we will call you to review the results.   Testing/Procedures: NONE   Follow-Up: At [facility], you and your health needs are our priority.  As part of our continuing mission to provide you with exceptional heart care, we have created designated Provider Care Teams.  These Care Teams include your primary Cardiologist (physician) and Advanced Practice Providers (APPs -  Physician Assistants and Nurse Practitioners) who all work together to provide you with the care you need, when you need it.  We recommend signing up for the patient portal called "MyChart".  Sign up information is provided on this After Visit Summary.  MyChart is used to connect with patients for Virtual Visits (Telemedicine).  Patients are able to view lab/test results, encounter notes, upcoming appointments, etc.  Non-urgent messages can be sent to your provider as well.   To learn more about what you can do with MyChart, go to [email or web address].    Your next appointment:   1 year(s)  The format for your next appointment:   In Person  Provider:   Larae Grooms, MD {   Important Information About Sugar

## 2022-01-17 ENCOUNTER — Encounter: Payer: Self-pay | Admitting: Internal Medicine

## 2022-01-17 NOTE — Patient Instructions (Addendum)
     Blood work was ordered.     Medications changes include :   if the pepcid is not helping go away and stop it.  You take Tums as needed.    Return in about 6 months (around 07/25/2022) for follow up.

## 2022-01-17 NOTE — Progress Notes (Signed)
Subjective:    Patient ID: [name], female    DOB: [birth year], 88 y.o.   MRN: [id number]     HPI Sruti is here for follow up of her chronic medical problems, including htn, Afib, GERD, hypothyroid, CKD, anxiety, iron def anemia, hld, vit d def  Still living by herself.  She is here today with her daughter.    At her last visit we recommended Pepcid 20 mg daily at bedtime.  It did not help  - she still has it and her daughter thinks it may be stress/anxiety.    She has chronic back pain, poor balance.   Her daughter wonders about PT.    Medications and allergies reviewed with patient and updated if appropriate.  Current Outpatient Medications on File Prior to Visit  Medication Sig Dispense Refill   carvedilol (COREG) 6.25 MG tablet TAKE 1 TABLET BY MOUTH TWICE A DAY 180 tablet 0   Cholecalciferol 50 MCG (2000 UT) CAPS Take 1 capsule (2,000 Units total) by mouth daily. 30 capsule    ELIQUIS 5 MG TABS tablet TAKE 1 TABLET BY MOUTH TWICE A DAY 60 tablet 5   furosemide (LASIX) 20 MG tablet Take 1 tablet (20 mg total) by mouth daily as needed. 30 tablet 11   levothyroxine (SYNTHROID) 75 MCG tablet TAKE 1 TABLET BY MOUTH DAILY EXCEPT TAKE 1/2 TABLET ON TUESDAYS AND SATURDAY 72 tablet 1   lisinopril (ZESTRIL) 40 MG tablet TAKE 1 TABLET BY MOUTH EVERY DAY 90 tablet 1   LORazepam (ATIVAN) 0.5 MG tablet TAKE 1/2 TABLET BY MOUTH DAILY AS NEEDED FOR ANXIETY 30 tablet 1   omeprazole (PRILOSEC) 40 MG capsule TAKE 1 CAPSULE BY MOUTH EVERY DAY 90 capsule 3   vitamin B-12 (CYANOCOBALAMIN) 1000 MCG tablet Take 1 tablet (1,000 mcg total) by mouth daily.     No current facility-administered medications on file prior to visit.     Review of Systems  Constitutional:  Negative for chills and fever.  Respiratory:  Positive for shortness of breath (occ). Negative for cough and wheezing.   Cardiovascular:  Positive for leg swelling. Negative for chest pain and palpitations.   Neurological:  Negative for light-headedness and headaches.       Objective:   Vitals:   01/22/22 1329  BP: 126/78  Pulse: 94  Temp: 98 F (36.7 C)  SpO2: 99%   BP Readings from Last 3 Encounters:  01/22/22 126/78  01/15/22 126/66  11/22/21 110/70   Wt Readings from Last 3 Encounters:  01/22/22 141 lb (64 kg)  01/15/22 142 lb 6.4 oz (64.6 kg)  01/12/21 144 lb 3.2 oz (65.4 kg)   Body mass index is 29.47 kg/m.    Physical Exam Constitutional:      General: She is not in acute distress.    Appearance: Normal appearance.  HENT:     Head: Normocephalic and atraumatic.  Eyes:     Conjunctiva/sclera: Conjunctivae normal.  Cardiovascular:     Rate and Rhythm: Normal rate and regular rhythm.     Heart sounds: Normal heart sounds. No murmur heard. Pulmonary:     Effort: Pulmonary effort is normal. No respiratory distress.     Breath sounds: Normal breath sounds. No wheezing.  Musculoskeletal:     Cervical back: Neck supple.     Right lower leg: No edema.     Left lower leg: No edema.  Lymphadenopathy:     Cervical: No cervical adenopathy.  Skin:  General: Skin is warm and dry.     Findings: No rash.  Neurological:     Mental Status: She is alert. Mental status is at baseline.  Psychiatric:        Mood and Affect: Mood normal.        Behavior: Behavior normal.        Lab Results  Component Value Date   WBC 6.3 07/04/2021   HGB 11.9 (L) 07/04/2021   HCT 36.1 07/04/2021   PLT 225.0 07/04/2021   GLUCOSE 111 (H) 07/04/2021   CHOL 221 (H) 07/01/2019   TRIG 173.0 (H) 07/01/2019   HDL 55.20 07/01/2019   LDLDIRECT 128.9 09/09/2012   LDLCALC 131 (H) 07/01/2019   ALT 9 07/04/2021   AST 13 07/04/2021   NA 140 07/04/2021   K 4.4 07/04/2021   CL 108 07/04/2021   CREATININE 1.21 (H) 07/04/2021   BUN 18 07/04/2021   CO2 26 07/04/2021   TSH 0.77 07/04/2021   INR 1.1 12/31/2016   HGBA1C 5.2 12/29/2019   MICROALBUR 0.2 04/24/2006     Assessment & Plan:     See Problem List for Assessment and Plan of chronic medical problems.

## 2022-01-22 ENCOUNTER — Ambulatory Visit (INDEPENDENT_AMBULATORY_CARE_PROVIDER_SITE_OTHER): Payer: Medicare Other | Admitting: Internal Medicine

## 2022-01-22 VITALS — BP 126/78 | HR 94 | Temp 98.0°F | Ht <= 58 in | Wt 141.0 lb

## 2022-01-22 DIAGNOSIS — D509 Iron deficiency anemia, unspecified: Secondary | ICD-10-CM

## 2022-01-22 DIAGNOSIS — F419 Anxiety disorder, unspecified: Secondary | ICD-10-CM

## 2022-01-22 DIAGNOSIS — K219 Gastro-esophageal reflux disease without esophagitis: Secondary | ICD-10-CM

## 2022-01-22 DIAGNOSIS — I10 Essential (primary) hypertension: Secondary | ICD-10-CM | POA: Diagnosis not present

## 2022-01-22 DIAGNOSIS — E782 Mixed hyperlipidemia: Secondary | ICD-10-CM

## 2022-01-22 DIAGNOSIS — N1831 Chronic kidney disease, stage 3a: Secondary | ICD-10-CM

## 2022-01-22 DIAGNOSIS — I4819 Other persistent atrial fibrillation: Secondary | ICD-10-CM | POA: Diagnosis not present

## 2022-01-22 DIAGNOSIS — E038 Other specified hypothyroidism: Secondary | ICD-10-CM | POA: Diagnosis not present

## 2022-01-22 LAB — CBC WITH DIFFERENTIAL/PLATELET
Basophils Absolute: 0 10*3/uL (ref 0.0–0.1)
Basophils Relative: 0.7 % (ref 0.0–3.0)
Eosinophils Absolute: 0.1 10*3/uL (ref 0.0–0.7)
Eosinophils Relative: 1.1 % (ref 0.0–5.0)
HCT: 36.9 % (ref 36.0–46.0)
Hemoglobin: 12.3 g/dL (ref 12.0–15.0)
Lymphocytes Relative: 23.9 % (ref 12.0–46.0)
Lymphs Abs: 1.7 10*3/uL (ref 0.7–4.0)
MCHC: 33.4 g/dL (ref 30.0–36.0)
MCV: 93.5 fl (ref 78.0–100.0)
Monocytes Absolute: 0.7 10*3/uL (ref 0.1–1.0)
Monocytes Relative: 9.5 % (ref 3.0–12.0)
Neutro Abs: 4.6 10*3/uL (ref 1.4–7.7)
Neutrophils Relative %: 64.8 % (ref 43.0–77.0)
Platelets: 239 10*3/uL (ref 150.0–400.0)
RBC: 3.94 Mil/uL (ref 3.87–5.11)
RDW: 14 % (ref 11.5–15.5)
WBC: 7.1 10*3/uL (ref 4.0–10.5)

## 2022-01-22 LAB — COMPREHENSIVE METABOLIC PANEL
ALT: 7 U/L (ref 0–35)
AST: 12 U/L (ref 0–37)
Albumin: 4 g/dL (ref 3.5–5.2)
Alkaline Phosphatase: 66 U/L (ref 39–117)
BUN: 20 mg/dL (ref 6–23)
CO2: 24 mEq/L (ref 19–32)
Calcium: 9.2 mg/dL (ref 8.4–10.5)
Chloride: 107 mEq/L (ref 96–112)
Creatinine, Ser: 1.24 mg/dL — ABNORMAL HIGH (ref 0.40–1.20)
GFR: 37.92 mL/min — ABNORMAL LOW (ref >60.00)
Glucose, Bld: 100 mg/dL — ABNORMAL HIGH (ref 70–99)
Potassium: 4.3 mEq/L (ref 3.5–5.1)
Sodium: 137 mEq/L (ref 135–145)
Total Bilirubin: 0.5 mg/dL (ref 0.2–1.2)
Total Protein: 6.6 g/dL (ref 6.0–8.3)

## 2022-01-22 LAB — IBC PANEL
Iron: 92 ug/dL (ref 42–145)
Saturation Ratios: 20.7 % (ref 20.0–50.0)
TIBC: 443.8 ug/dL (ref 250.0–450.0)
Transferrin: 317 mg/dL (ref 212.0–360.0)

## 2022-01-22 LAB — TSH: TSH: 0.8 u[IU]/mL (ref 0.35–5.50)

## 2022-01-22 LAB — FERRITIN: Ferritin: 18.7 ng/mL (ref 10.0–291.0)

## 2022-01-22 NOTE — Assessment & Plan Note (Signed)
Chronic Well-controlled Continue lorazepam 0.25 mg daily as needed

## 2022-01-22 NOTE — Assessment & Plan Note (Signed)
Chronic Blood pressure well controlled CMP Continue Coreg 6.25 mg twice daily, lisinopril 40 mg daily 

## 2022-01-22 NOTE — Assessment & Plan Note (Addendum)
Chronic History of iron deficiency anemia Taking iron daily Check CBC, iron

## 2022-01-22 NOTE — Assessment & Plan Note (Signed)
Chronic  Clinically euthyroid Currently taking levothyroxine 75 mcg daily 5 days a week and 37.25 mcg daily twice a week Check tsh  Titrate med dose if needed

## 2022-01-22 NOTE — Assessment & Plan Note (Signed)
Chronic GERD controlled Continue omeprazole 40 mg daily, Pepcid 20 mg at bedtime

## 2022-01-22 NOTE — Assessment & Plan Note (Signed)
Chronic Intolerant of statins Diet controlled

## 2022-01-22 NOTE — Assessment & Plan Note (Signed)
Chronic ?Stable ?CMP ?

## 2022-01-22 NOTE — Assessment & Plan Note (Signed)
Chronic Following with cardiology Continue carvedilol 6.25 mg twice daily and Eliquis 5 mg twice daily CBC, CMP, TSH

## 2022-01-23 ENCOUNTER — Telehealth: Payer: Self-pay

## 2022-01-23 DIAGNOSIS — H9190 Unspecified hearing loss, unspecified ear: Secondary | ICD-10-CM | POA: Insufficient documentation

## 2022-01-23 DIAGNOSIS — H9193 Unspecified hearing loss, bilateral: Secondary | ICD-10-CM

## 2022-01-23 NOTE — Telephone Encounter (Signed)
ordered

## 2022-01-23 NOTE — Telephone Encounter (Signed)
Pt is going on 02/19/22 to [facility] for a hearing test.  They are requiring a Order from Dr. Quay Burow to complete th e test.  Fax [phone]

## 2022-02-19 DIAGNOSIS — H903 Sensorineural hearing loss, bilateral: Secondary | ICD-10-CM | POA: Diagnosis not present

## 2022-02-25 ENCOUNTER — Other Ambulatory Visit: Payer: Self-pay | Admitting: Internal Medicine

## 2022-02-26 ENCOUNTER — Telehealth: Payer: Self-pay | Admitting: Interventional Cardiology

## 2022-02-26 NOTE — Telephone Encounter (Signed)
Pt c/o medication issue:  1. Name of Medication: ELIQUIS 5 MG TABS tablet Aleve   2. How are you currently taking this medication (dosage and times per day)?   3. Are you having a reaction (difficulty breathing--STAT)?   4. What is your medication issue? Pt would like to know if its okay for her to take aleve when her back is hurting real bad.

## 2022-02-26 NOTE — Telephone Encounter (Signed)
I spoke with patient's daughter and told her patient should not take Aleve.  She is aware it is OK to take tylenol.

## 2022-03-20 ENCOUNTER — Encounter: Payer: Self-pay | Admitting: Obstetrics & Gynecology

## 2022-03-20 ENCOUNTER — Ambulatory Visit (INDEPENDENT_AMBULATORY_CARE_PROVIDER_SITE_OTHER): Payer: Medicare Other | Admitting: Obstetrics & Gynecology

## 2022-03-20 VITALS — BP 110/70 | HR 88

## 2022-03-20 DIAGNOSIS — Z4689 Encounter for fitting and adjustment of other specified devices: Secondary | ICD-10-CM

## 2022-03-20 DIAGNOSIS — N898 Other specified noninflammatory disorders of vagina: Secondary | ICD-10-CM | POA: Diagnosis not present

## 2022-03-20 DIAGNOSIS — Z23 Encounter for immunization: Secondary | ICD-10-CM

## 2022-03-20 MED ORDER — CLINDAMYCIN HCL 300 MG PO CAPS: 300.0000 mg | ORAL_CAPSULE | Freq: Two times a day (BID) | ORAL | 0 refills | Status: AC

## 2022-03-20 NOTE — Progress Notes (Signed)
    [name] [birth year] [id number]        88 y.o.  [id number]   RP: Pessary maintenance  HPI: Continued vaginal discharge with some odor.  No vaginal bleeding.  Well with Milex #4 Ring with support otherwise.  No pelvic, vaginal or vulvar pain.  Urine and BMs wnl.  No fever.   OB History  Gravida Para Term Preterm AB Living  '4 2 2   2 2  '$ SAB IAB Ectopic Multiple Live Births  2            # Outcome Date GA Lbr Len/2nd Weight Sex Delivery Anes PTL Lv  4 SAB           3 SAB           2 Term           1 Term             Past medical history,surgical history, problem list, medications, allergies, family history and social history were all reviewed and documented in the EPIC chart.   Directed ROS with pertinent positives and negatives documented in the history of present illness/assessment and plan.  Exam:  Vitals:   03/20/22 1526  BP: 110/70  Pulse: 88  SpO2: 99%   General appearance:  Normal  Gynecologic exam: Vulva normal.  Pessary easily removed and cleaned.  Vaginal mucosa intact.  Vaginal discharge stable.  No bleeding.  Pessary put back in place, good fit.     Assessment/Plan:  88 y.o. G4P2L22    1. Pessary maintenance Stable vaginal discharge. Well with Milex #4 Ring with support otherwise.  No pelvic, vaginal or vulvar pain. Vaginal mucosa intact.  Patient reassured.  F/U pessary maintenance in 4 months.   2. Vaginal discharge  Decision to treat for possible BV with Clinda 300 mg PO BID x 5 days.  Usage reviewed, prescription sent to pharmacy.  3. Flu vaccine need Flu vaccine given.  Other orders - clindamycin (CLEOCIN) 300 MG capsule; Take 1 capsule (300 mg total) by mouth 2 (two) times daily for 5 days.   Princess Bruins MD, 3:34 PM 03/20/2022

## 2022-03-24 ENCOUNTER — Other Ambulatory Visit: Payer: Self-pay | Admitting: Interventional Cardiology

## 2022-03-24 ENCOUNTER — Other Ambulatory Visit: Payer: Self-pay | Admitting: Internal Medicine

## 2022-03-30 DIAGNOSIS — Z23 Encounter for immunization: Secondary | ICD-10-CM | POA: Diagnosis not present

## 2022-04-12 DIAGNOSIS — H02134 Senile ectropion of left upper eyelid: Secondary | ICD-10-CM | POA: Diagnosis not present

## 2022-04-12 DIAGNOSIS — H353132 Nonexudative age-related macular degeneration, bilateral, intermediate dry stage: Secondary | ICD-10-CM | POA: Diagnosis not present

## 2022-04-12 DIAGNOSIS — H26493 Other secondary cataract, bilateral: Secondary | ICD-10-CM | POA: Diagnosis not present

## 2022-04-12 DIAGNOSIS — H35363 Drusen (degenerative) of macula, bilateral: Secondary | ICD-10-CM | POA: Diagnosis not present

## 2022-04-12 LAB — HM DIABETES EYE EXAM

## 2022-04-15 IMAGING — CR DG ELBOW COMPLETE 3+V*L*
4 series · 4 of 4 positions shown · non-contrast
Comparison: None.

CLINICAL DATA: Fall

EXAM:
LEFT ELBOW - COMPLETE 3+ VIEW

[x elbow ap left]
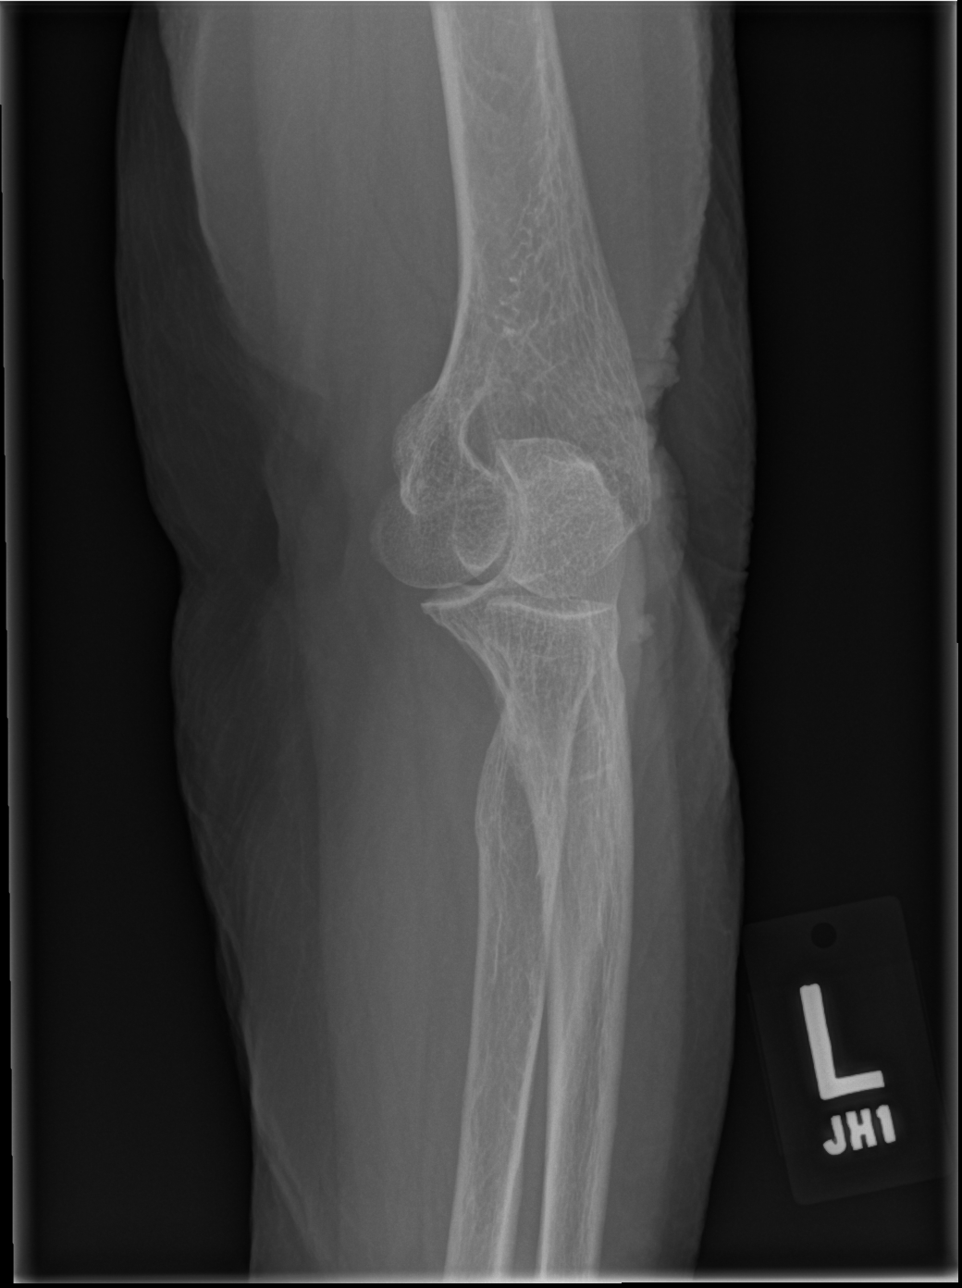

[x elbow obl left (1 of 2)]
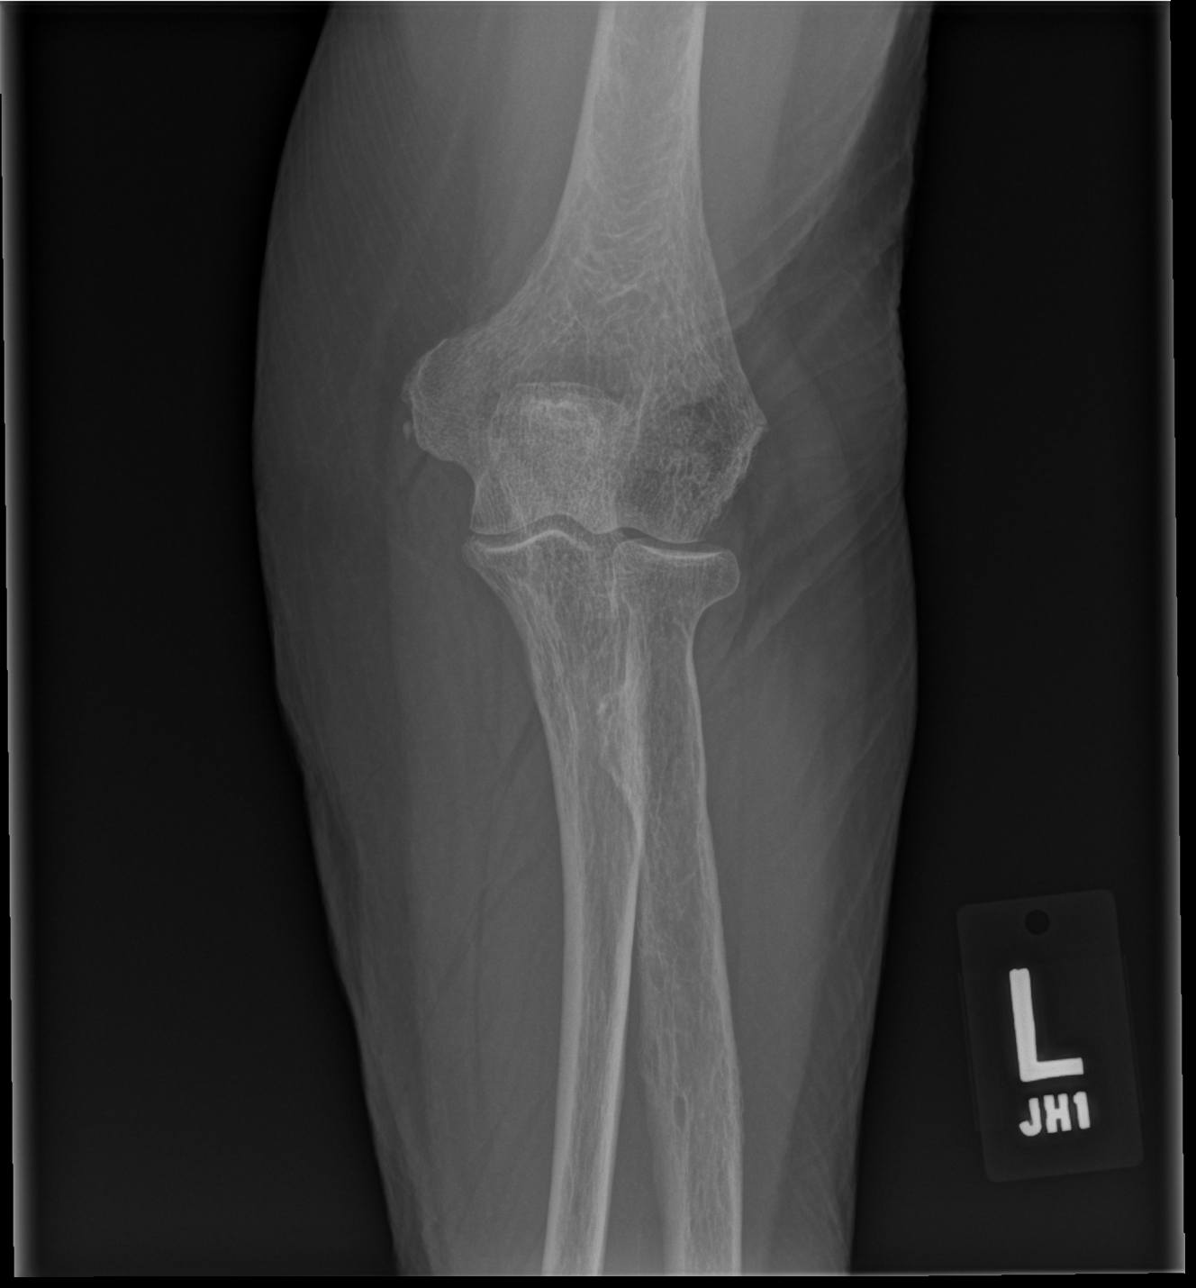

[x elbow obl left (2 of 2)]
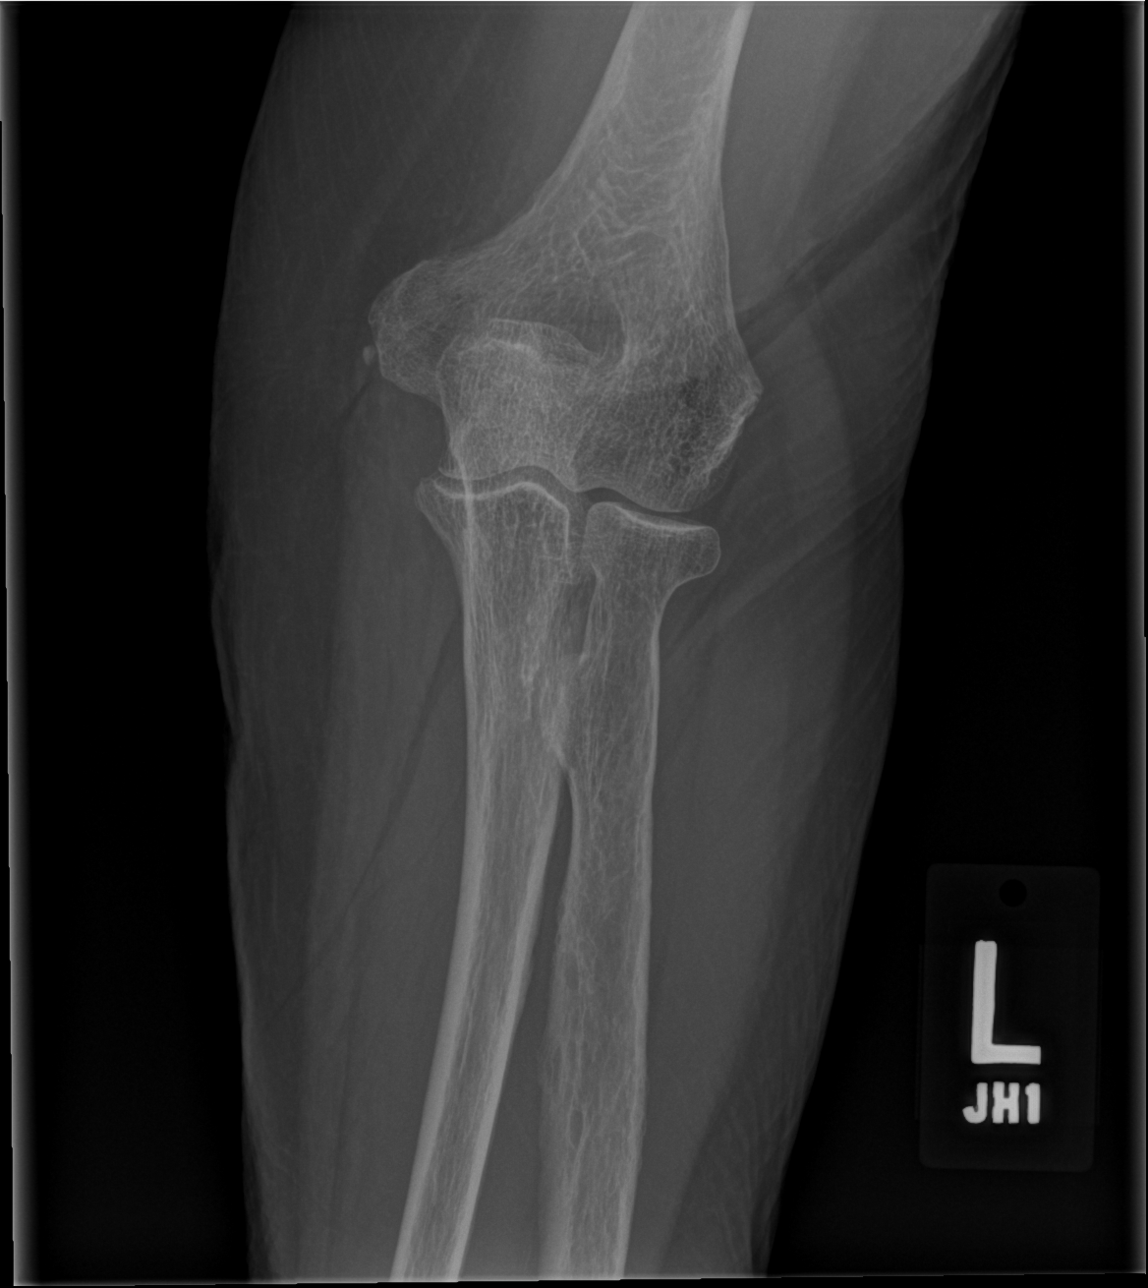

[x elbow lat left]
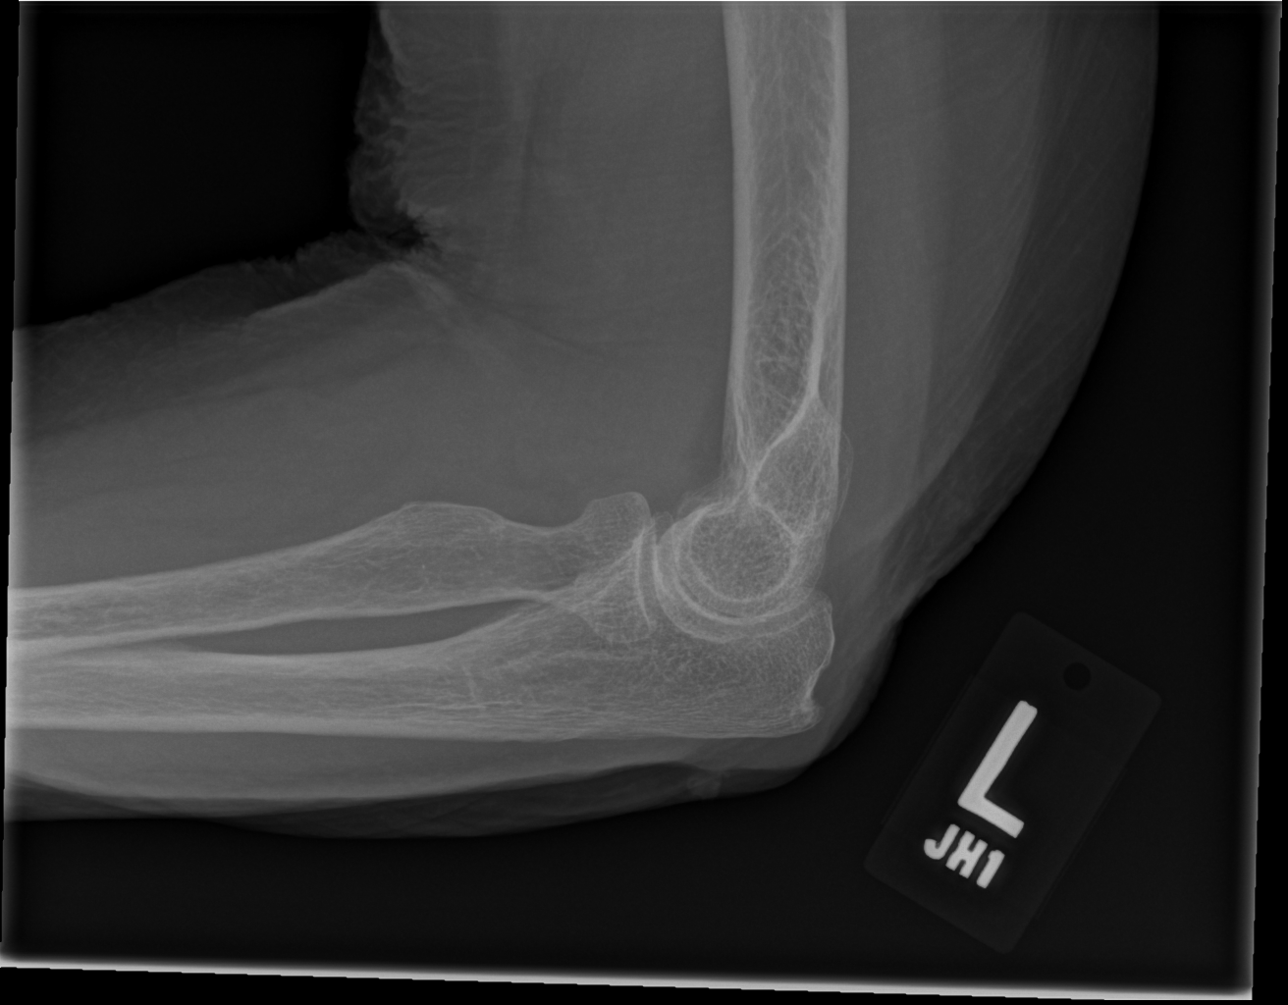

[4 of 4 positions shown; findings below may reference images not displayed]

FINDINGS: No fracture or dislocation.  No significant elbow effusion.
IMPRESSION: Negative.

## 2022-04-15 IMAGING — CT CT CERVICAL SPINE W/O CM
2 series · 11 of 29 positions shown, 14 images · non-contrast
Comparison: 11/10/2013

CLINICAL DATA: Head trauma, anti coagulation.  Fall.

EXAM:
CT HEAD WITHOUT CONTRAST
CT CERVICAL SPINE WITHOUT CONTRAST
TECHNIQUE: Multidetector CT imaging of the head and cervical spine was
performed following the standard protocol without intravenous
contrast. Multiplanar CT image reconstructions of the cervical spine
were also generated.

[Series 4: c spine soft · axial · 0.39mm/px · z∈[-307,-191]mm · 6 of 76 slices shown, 8 images]
[im 12/76  soft-tissue]
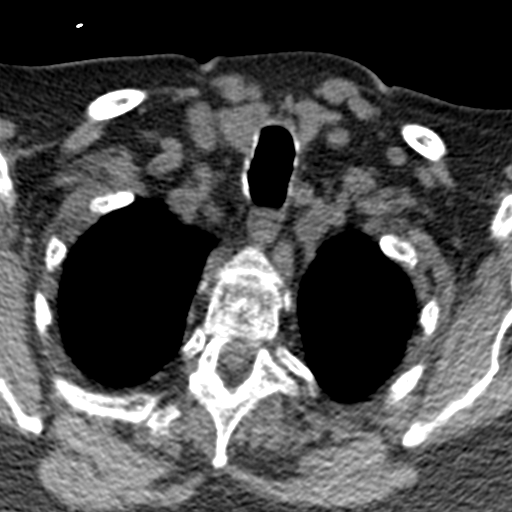
[im 12/76  bone]
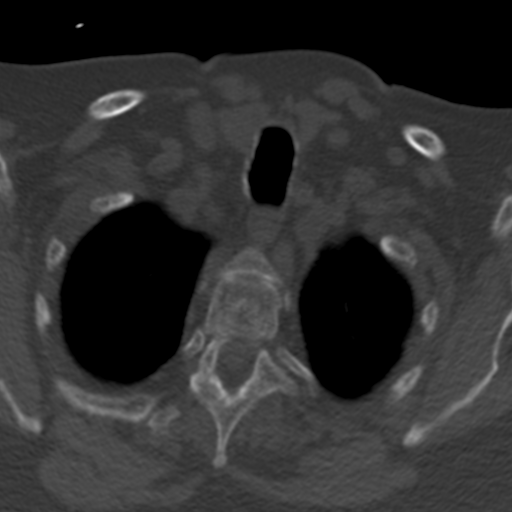
[im 24/76  bone]
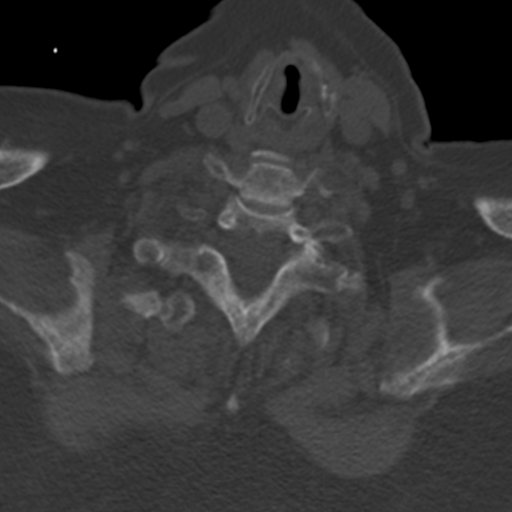
[im 35/76  bone]
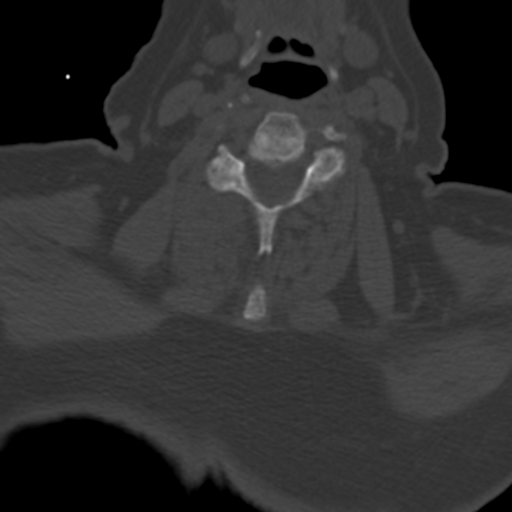
[im 47/76  bone]
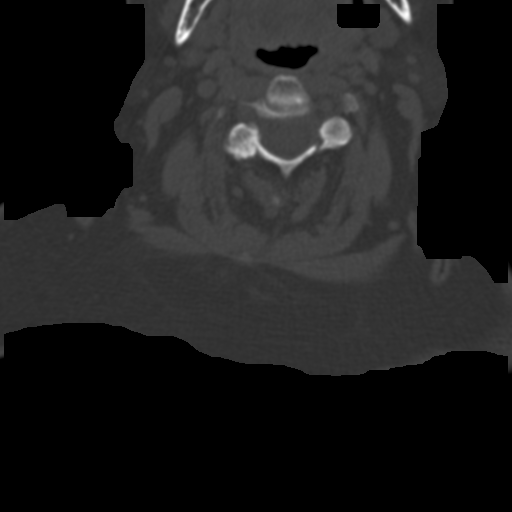
[im 58/76  soft-tissue]
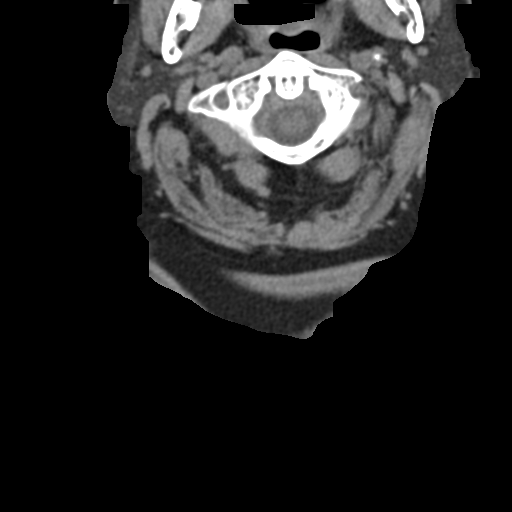
[im 58/76  bone]
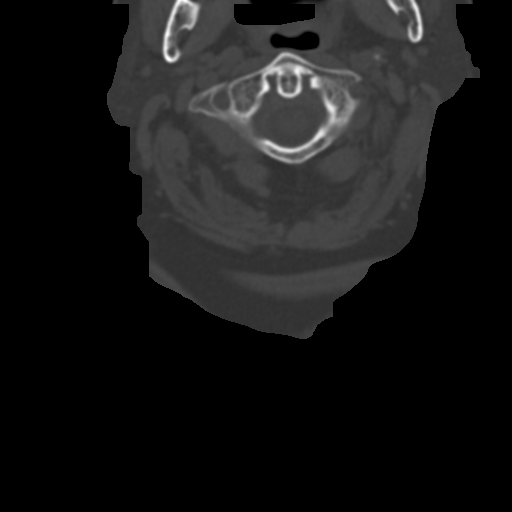
[im 70/76  bone]
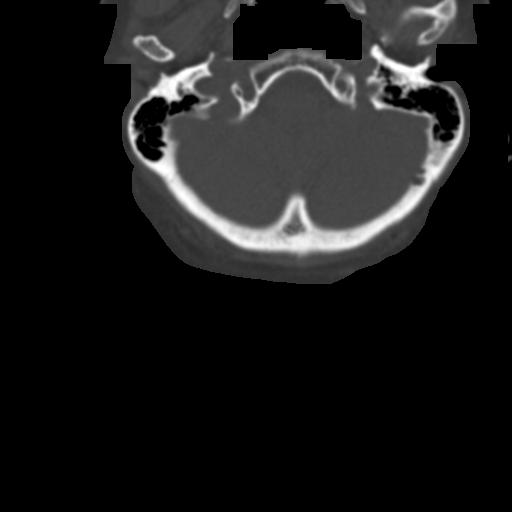

[Series 7: sagittal bone · sagittal · 0.23mm/px · 5 of 61 slices shown, 6 images]
[im 21/61  bone]
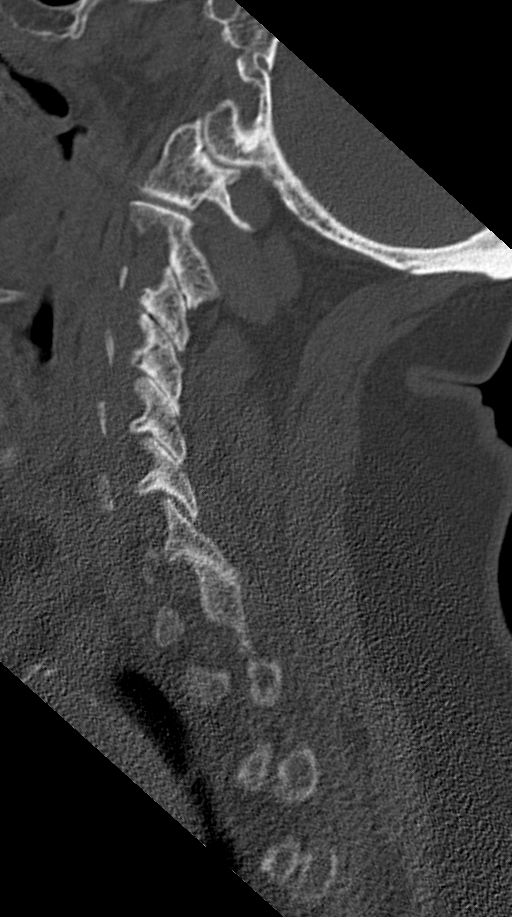
[im 26/61  bone]
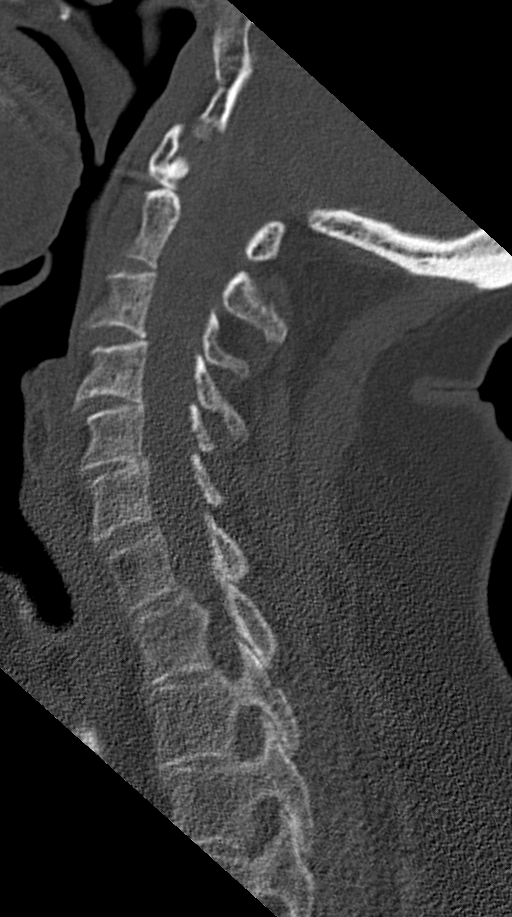
[im 31/61  soft-tissue]
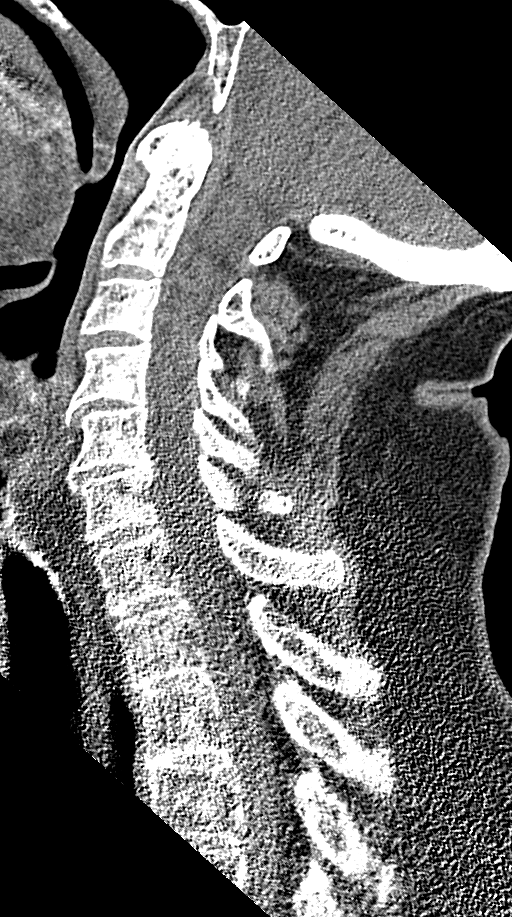
[im 31/61  bone]
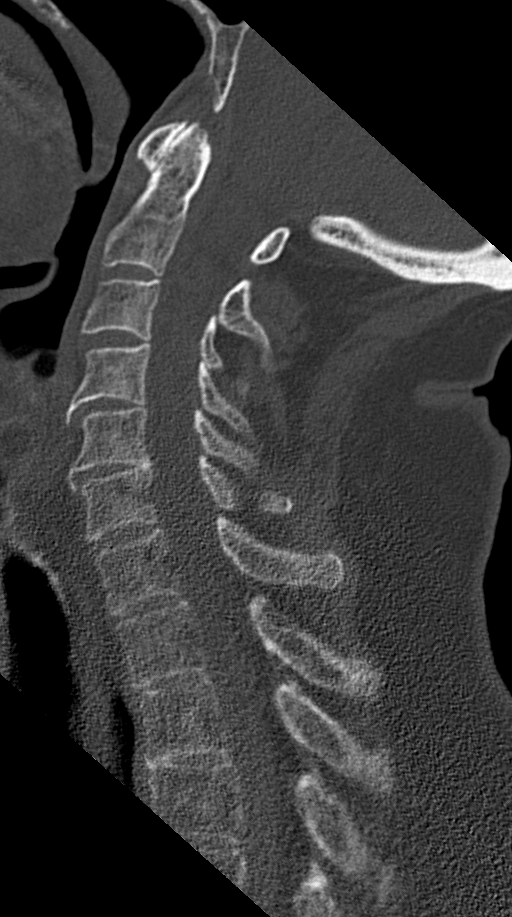
[im 36/61  bone]
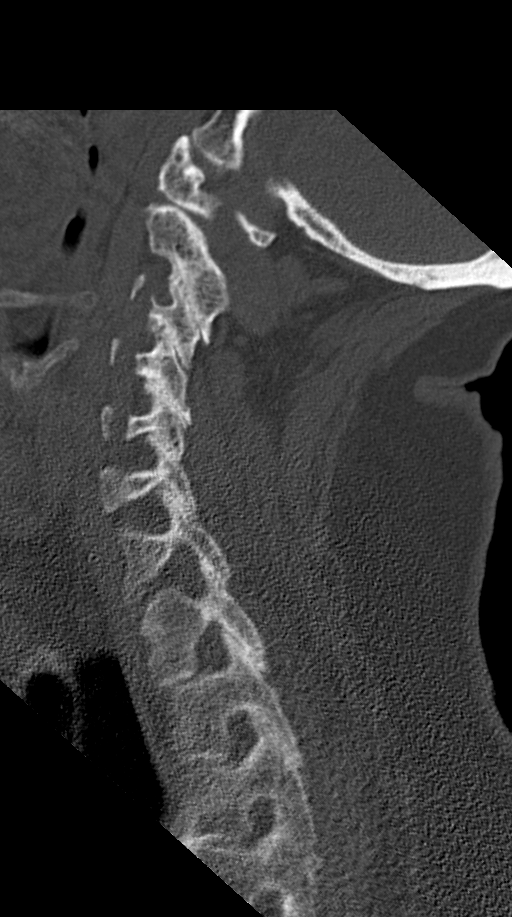
[im 41/61  bone]
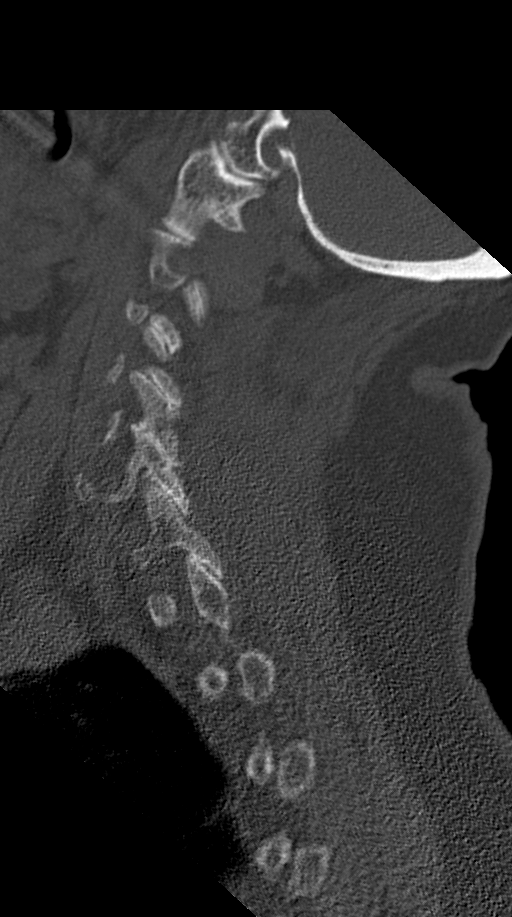

[11 of 29 positions shown; findings below may reference images not displayed]

FINDINGS: CT HEAD FINDINGS

Brain: Dilated perivascular space or remote lacunar infarct along
the inferior margin of the right lentiform nucleus.

Periventricular white matter and corona radiata hypodensities favor
chronic ischemic microvascular white matter disease. Otherwise, the
brainstem, cerebellum, cerebral peduncles, thalamus, basal ganglia,
basilar cisterns, and ventricular system appear within normal
limits. No intracranial hemorrhage, mass lesion, or acute CVA.

Vascular: Unremarkable

Skull: Unremarkable

Sinuses/Orbits: Unremarkable

Other: Large left parietal scalp hematoma.

CT CERVICAL SPINE FINDINGS

Alignment: No vertebral subluxation is observed.

Skull base and vertebrae: No cervical spine fracture or acute
subluxation is present. 9 mm hemangioma eccentric to the right in
the C7 vertebral body.

Soft tissues and spinal canal: Aortic arch and branch vessel
atherosclerotic vascular disease.

Disc levels: Edema borderline cervical spondylosis bilateral
foraminal narrowing at C3-4.

Upper chest: Unremarkable

Other: No supplemental non-categorized findings.
IMPRESSION: 1. No acute intracranial findings or acute cervical spine findings.
2. Large left parietal scalp hematoma.
3. Periventricular white matter and corona radiata hypodensities
favor chronic ischemic microvascular white matter disease.
4. Dilated perivascular space or remote lacunar infarct along the
inferior margin of the right lentiform nucleus (chronic, stable).
5. Borderline bilateral foraminal narrowing at C3-4.
6. Aortic atherosclerosis.

Aortic Atherosclerosis (HBI6X-KE8.8).

## 2022-05-10 ENCOUNTER — Encounter: Payer: Self-pay | Admitting: Internal Medicine

## 2022-05-23 ENCOUNTER — Other Ambulatory Visit: Payer: Self-pay | Admitting: Internal Medicine

## 2022-05-25 ENCOUNTER — Telehealth: Payer: Self-pay | Admitting: Internal Medicine

## 2022-05-25 NOTE — Telephone Encounter (Signed)
Patient called and wanted to know if we are giving the RSV shot here and if so if she can get it done here. Call back is [phone]

## 2022-05-25 NOTE — Telephone Encounter (Signed)
Spoke with patient today. 

## 2022-06-19 ENCOUNTER — Other Ambulatory Visit: Payer: Self-pay | Admitting: Interventional Cardiology

## 2022-06-19 DIAGNOSIS — I4819 Other persistent atrial fibrillation: Secondary | ICD-10-CM

## 2022-06-19 NOTE — Telephone Encounter (Signed)
Prescription refill request for Eliquis received. Indication:afib Last office visit:8/23 Scr:1.2 Age: 89 Weight:64 kg  Prescription refilled

## 2022-06-20 ENCOUNTER — Other Ambulatory Visit: Payer: Self-pay | Admitting: Internal Medicine

## 2022-06-21 ENCOUNTER — Ambulatory Visit (INDEPENDENT_AMBULATORY_CARE_PROVIDER_SITE_OTHER): Payer: Medicare Other | Admitting: Obstetrics & Gynecology

## 2022-06-21 ENCOUNTER — Encounter: Payer: Self-pay | Admitting: Obstetrics & Gynecology

## 2022-06-21 VITALS — BP 118/80 | Resp 14

## 2022-06-21 DIAGNOSIS — Z4689 Encounter for fitting and adjustment of other specified devices: Secondary | ICD-10-CM

## 2022-06-21 NOTE — Progress Notes (Signed)
    [name] [birth year] [id number]        89 y.o.  [id number]   RP: Pessary maintenance  HPI: Well with Milex #4 Ring with support.  No vaginal discharge or bleeding.  No pelvic, vaginal or vulvar pain.  Urine and BMs wnl.  No fever.     OB History  Gravida Para Term Preterm AB Living  '4 2 2   2 2  '$ SAB IAB Ectopic Multiple Live Births  2            # Outcome Date GA Lbr Len/2nd Weight Sex Delivery Anes PTL Lv  4 SAB           3 SAB           2 Term           1 Term             Past medical history,surgical history, problem list, medications, allergies, family history and social history were all reviewed and documented in the EPIC chart.   Directed ROS with pertinent positives and negatives documented in the history of present illness/assessment and plan.  Exam:  Vitals:   06/21/22 1531  BP: 118/80  Resp: 14  SpO2: 99%   General appearance:  Normal  Gynecologic exam: Vulva normal.  Pessary easily removed and cleaned.  Vaginal mucosa intact.  Vaginal discharge minimal, improved.  No bleeding.  Pessary put back in place, good fit.     Assessment/Plan:  89 y.o. G4P2L22    1. Pessary maintenance Stable vaginal discharge. Well with Milex #4 Ring with support otherwise.  No pelvic, vaginal or vulvar pain. Vaginal mucosa intact.  Patient reassured.  F/U pessary maintenance in 4 months.    Princess Bruins MD, 3:37 PM 06/21/2022

## 2022-07-22 ENCOUNTER — Encounter: Payer: Self-pay | Admitting: Internal Medicine

## 2022-07-22 NOTE — Progress Notes (Unsigned)
Subjective:    Patient ID: [name], female    DOB: [birth year], 89 y.o.   MRN: [name]     HPI [name] is here for follow up of her chronic medical problems, including htn, Afib, GERD, hypothyroid, CKD, anxiety, iron def anemia, hld, vit d def  Left hip pain - pain x 3 days - has have to use walker.  It comes and goes.  Pain is worse for the past 3 days.  Pain is worse with walking.  Some pain with laying on it, but mostly the pain is with walking.  She has scoliosis and chronic back issues.  She is wondering what else she can take besides Tylenol.  Still has sick feeling at times - nausea.  Tums helps.      Medications and allergies reviewed with patient and updated if appropriate.  Current Outpatient Medications on File Prior to Visit  Medication Sig Dispense Refill   carvedilol (COREG) 6.25 MG tablet TAKE 1 TABLET BY MOUTH TWICE A DAY 180 tablet 3   Cholecalciferol 50 MCG (2000 UT) CAPS Take 1 capsule (2,000 Units total) by mouth daily. 30 capsule    ELIQUIS 5 MG TABS tablet TAKE 1 TABLET BY MOUTH TWICE A DAY 60 tablet 5   furosemide (LASIX) 20 MG tablet Take 1 tablet (20 mg total) by mouth daily as needed. 30 tablet 11   levothyroxine (SYNTHROID) 75 MCG tablet TAKE 1 TABLET BY MOUTH DAILY EXCEPT TAKE 1/2 TABLET ON TUESDAYS AND SATURDAY 72 tablet 1   lisinopril (ZESTRIL) 40 MG tablet TAKE 1 TABLET BY MOUTH EVERY DAY 90 tablet 3   LORazepam (ATIVAN) 0.5 MG tablet TAKE 1/2 TABLET BY MOUTH DAILY AS NEEDED FOR ANXIETY 30 tablet 1   omeprazole (PRILOSEC) 40 MG capsule TAKE 1 CAPSULE BY MOUTH EVERY DAY 90 capsule 3   vitamin B-12 (CYANOCOBALAMIN) 1000 MCG tablet Take 1 tablet (1,000 mcg total) by mouth daily.     No current facility-administered medications on file prior to visit.     Review of Systems  Constitutional:  Negative for fever.  Respiratory:  Positive for shortness of breath (mild wtih exertion). Negative for cough and wheezing.   Cardiovascular:   Positive for leg swelling (mild). Negative for chest pain and palpitations.  Musculoskeletal:  Positive for back pain.       Left hip pain  Neurological:  Negative for light-headedness and headaches.       Objective:   Vitals:   07/23/22 1504  BP: 122/78  Pulse: 64  Temp: 97.8 F (36.6 C)  SpO2: 97%   BP Readings from Last 3 Encounters:  07/23/22 122/78  06/21/22 118/80  03/20/22 110/70   Wt Readings from Last 3 Encounters:  07/23/22 139 lb (63 kg)  01/22/22 141 lb (64 kg)  01/15/22 142 lb 6.4 oz (64.6 kg)   Body mass index is 29.05 kg/m.    Physical Exam Constitutional:      General: She is not in acute distress.    Appearance: Normal appearance.  HENT:     Head: Normocephalic and atraumatic.     Right Ear: Tympanic membrane, ear canal and external ear normal. There is no impacted cerumen.     Left Ear: Tympanic membrane, ear canal and external ear normal. There is no impacted cerumen.  Eyes:     Conjunctiva/sclera: Conjunctivae normal.  Cardiovascular:     Rate and Rhythm: Normal rate. Rhythm irregular.     Heart sounds:  Normal heart sounds.  Pulmonary:     Effort: Pulmonary effort is normal. No respiratory distress.     Breath sounds: Normal breath sounds. No wheezing.  Musculoskeletal:        General: No tenderness (no tenderness lateral hip pain, no left side pain with palpation).     Cervical back: Neck supple.     Right lower leg: No edema.     Left lower leg: No edema.  Lymphadenopathy:     Cervical: No cervical adenopathy.  Skin:    General: Skin is warm and dry.     Findings: No rash.  Neurological:     Mental Status: She is alert. Mental status is at baseline.  Psychiatric:        Mood and Affect: Mood normal.        Behavior: Behavior normal.        Lab Results  Component Value Date   WBC 7.1 01/22/2022   HGB 12.3 01/22/2022   HCT 36.9 01/22/2022   PLT 239.0 01/22/2022   GLUCOSE 100 (H) 01/22/2022   CHOL 221 (H) 07/01/2019   TRIG  173.0 (H) 07/01/2019   HDL 55.20 07/01/2019   LDLDIRECT 128.9 09/09/2012   LDLCALC 131 (H) 07/01/2019   ALT 7 01/22/2022   AST 12 01/22/2022   NA 137 01/22/2022   K 4.3 01/22/2022   CL 107 01/22/2022   CREATININE 1.24 (H) 01/22/2022   BUN 20 01/22/2022   CO2 24 01/22/2022   TSH 0.80 01/22/2022   INR 1.1 12/31/2016   HGBA1C 5.2 12/29/2019   MICROALBUR 0.2 04/24/2006     Assessment & Plan:    See Problem List for Assessment and Plan of chronic medical problems.

## 2022-07-22 NOTE — Patient Instructions (Addendum)
      Blood work was ordered.   The lab is on the first floor.    Medications changes include :   None      Return in about 6 months (around 01/21/2023) for follow up.

## 2022-07-23 ENCOUNTER — Ambulatory Visit (INDEPENDENT_AMBULATORY_CARE_PROVIDER_SITE_OTHER): Payer: Medicare Other | Admitting: Internal Medicine

## 2022-07-23 ENCOUNTER — Encounter: Payer: Self-pay | Admitting: Internal Medicine

## 2022-07-23 VITALS — BP 122/78 | HR 64 | Temp 97.8°F | Ht <= 58 in | Wt 139.0 lb

## 2022-07-23 DIAGNOSIS — G8929 Other chronic pain: Secondary | ICD-10-CM

## 2022-07-23 DIAGNOSIS — E038 Other specified hypothyroidism: Secondary | ICD-10-CM

## 2022-07-23 DIAGNOSIS — F419 Anxiety disorder, unspecified: Secondary | ICD-10-CM | POA: Diagnosis not present

## 2022-07-23 DIAGNOSIS — D509 Iron deficiency anemia, unspecified: Secondary | ICD-10-CM

## 2022-07-23 DIAGNOSIS — I10 Essential (primary) hypertension: Secondary | ICD-10-CM | POA: Diagnosis not present

## 2022-07-23 DIAGNOSIS — K219 Gastro-esophageal reflux disease without esophagitis: Secondary | ICD-10-CM | POA: Diagnosis not present

## 2022-07-23 DIAGNOSIS — E559 Vitamin D deficiency, unspecified: Secondary | ICD-10-CM | POA: Diagnosis not present

## 2022-07-23 DIAGNOSIS — I4819 Other persistent atrial fibrillation: Secondary | ICD-10-CM | POA: Diagnosis not present

## 2022-07-23 DIAGNOSIS — E782 Mixed hyperlipidemia: Secondary | ICD-10-CM | POA: Diagnosis not present

## 2022-07-23 DIAGNOSIS — M545 Low back pain, unspecified: Secondary | ICD-10-CM | POA: Insufficient documentation

## 2022-07-23 DIAGNOSIS — N1831 Chronic kidney disease, stage 3a: Secondary | ICD-10-CM

## 2022-07-23 LAB — CBC WITH DIFFERENTIAL/PLATELET
Basophils Absolute: 0 10*3/uL (ref 0.0–0.1)
Basophils Relative: 0.6 % (ref 0.0–3.0)
Eosinophils Absolute: 0.1 10*3/uL (ref 0.0–0.7)
Eosinophils Relative: 1.1 % (ref 0.0–5.0)
HCT: 36.3 % (ref 36.0–46.0)
Hemoglobin: 12.3 g/dL (ref 12.0–15.0)
Lymphocytes Relative: 24.8 % (ref 12.0–46.0)
Lymphs Abs: 1.8 10*3/uL (ref 0.7–4.0)
MCHC: 33.8 g/dL (ref 30.0–36.0)
MCV: 90.4 fl (ref 78.0–100.0)
Monocytes Absolute: 0.7 10*3/uL (ref 0.1–1.0)
Monocytes Relative: 9.6 % (ref 3.0–12.0)
Neutro Abs: 4.7 10*3/uL (ref 1.4–7.7)
Neutrophils Relative %: 63.9 % (ref 43.0–77.0)
Platelets: 247 10*3/uL (ref 150.0–400.0)
RBC: 4.01 Mil/uL (ref 3.87–5.11)
RDW: 14.5 % (ref 11.5–15.5)
WBC: 7.3 10*3/uL (ref 4.0–10.5)

## 2022-07-23 LAB — LIPID PANEL
Cholesterol: 195 mg/dL (ref 0–200)
HDL: 52.1 mg/dL (ref >39.00)
LDL Cholesterol: 103 mg/dL — ABNORMAL HIGH (ref 0–99)
NonHDL: 143.32
Total CHOL/HDL Ratio: 4
Triglycerides: 200 mg/dL — ABNORMAL HIGH (ref 0.0–149.0)
VLDL: 40 mg/dL (ref 0.0–40.0)

## 2022-07-23 LAB — COMPREHENSIVE METABOLIC PANEL
ALT: 8 U/L (ref 0–35)
AST: 12 U/L (ref 0–37)
Albumin: 4.1 g/dL (ref 3.5–5.2)
Alkaline Phosphatase: 66 U/L (ref 39–117)
BUN: 17 mg/dL (ref 6–23)
CO2: 24 mEq/L (ref 19–32)
Calcium: 9.4 mg/dL (ref 8.4–10.5)
Chloride: 101 mEq/L (ref 96–112)
Creatinine, Ser: 1.19 mg/dL (ref 0.40–1.20)
GFR: 39.7 mL/min — ABNORMAL LOW (ref >60.00)
Glucose, Bld: 99 mg/dL (ref 70–99)
Potassium: 4.6 mEq/L (ref 3.5–5.1)
Sodium: 133 mEq/L — ABNORMAL LOW (ref 135–145)
Total Bilirubin: 0.5 mg/dL (ref 0.2–1.2)
Total Protein: 6.8 g/dL (ref 6.0–8.3)

## 2022-07-23 LAB — IBC PANEL
Iron: 63 ug/dL (ref 42–145)
Saturation Ratios: 16.5 % — ABNORMAL LOW (ref 20.0–50.0)
TIBC: 382.2 ug/dL (ref 250.0–450.0)
Transferrin: 273 mg/dL (ref 212.0–360.0)

## 2022-07-23 LAB — FERRITIN: Ferritin: 22.6 ng/mL (ref 10.0–291.0)

## 2022-07-23 LAB — VITAMIN D 25 HYDROXY (VIT D DEFICIENCY, FRACTURES): VITD: 23.59 ng/mL — ABNORMAL LOW (ref 30.00–100.00)

## 2022-07-23 LAB — TSH: TSH: 0.13 u[IU]/mL — ABNORMAL LOW (ref 0.35–5.50)

## 2022-07-23 NOTE — Assessment & Plan Note (Signed)
Chronic Taking vitamin D daily Check vitamin D level  

## 2022-07-23 NOTE — Assessment & Plan Note (Signed)
Chronic Blood pressure well controlled CMP Continue Coreg 6.25 mg twice daily, lisinopril 40 mg daily

## 2022-07-23 NOTE — Assessment & Plan Note (Addendum)
Chronic Has seen orthopedics-there is not much that they can do Likely has some scoliosis which is contributing to the pain Today she is complaining of left lower back/posterior hip pain which is likely all related to the back-arthritis and scoliosis Continue Tylenol-can take up to 3000 mg a day Did not tolerate tramadol in the past.  Daughter does not want to try Tylenol with codeine because of possible side effects Can take an Advil or Aleve here and there, but not on a daily basis given GERD, CKD and being on Eliquis Ice, heat, topical medications  They will let me know if this is not helpful-at this point no need to refer to orthopedics since there is not much that they can do

## 2022-07-23 NOTE — Assessment & Plan Note (Signed)
Chronic Regular exercise and healthy diet encouraged Check lipid panel  Intolerant of statins Given age continue lifestyle control

## 2022-07-23 NOTE — Assessment & Plan Note (Signed)
Chronic Stable CMP, CBC

## 2022-07-23 NOTE — Assessment & Plan Note (Signed)
Chronic Follows with cardiology Continue carvedilol 6.25 mg twice daily and Eliquis 5 mg twice daily CBC, CMP, TSH

## 2022-07-23 NOTE — Assessment & Plan Note (Signed)
Chronic Overall controlled Continue lorazepam 0.25 milligram daily as needed

## 2022-07-23 NOTE — Assessment & Plan Note (Addendum)
Chronic GERD controlled, but she does have intermittent feeling of sickness or nausea which is likely GERD Continue omeprazole 40 mg daily She did try Pepcid at night but that did not seem to help Tums does help-okay to continue Tums as needed

## 2022-07-23 NOTE — Assessment & Plan Note (Signed)
Chronic  Clinically euthyroid Check tsh and will titrate med dose if needed Currently taking levothyroxine 75 mcg 5 days a week, 37.5 mcg 2 days a week

## 2022-07-23 NOTE — Assessment & Plan Note (Addendum)
Chronic Check CBC, iron panel On Eliquis 5 mg twice daily Taking iron daily

## 2022-07-26 ENCOUNTER — Other Ambulatory Visit: Payer: Self-pay | Admitting: Internal Medicine

## 2022-07-26 MED ORDER — LEVOTHYROXINE SODIUM 75 MCG PO TABS: ORAL_TABLET | ORAL | 1 refills | Status: DC

## 2022-08-07 ENCOUNTER — Ambulatory Visit (INDEPENDENT_AMBULATORY_CARE_PROVIDER_SITE_OTHER): Payer: Medicare Other | Admitting: Obstetrics & Gynecology

## 2022-08-07 ENCOUNTER — Encounter: Payer: Self-pay | Admitting: Obstetrics & Gynecology

## 2022-08-07 VITALS — BP 114/76 | HR 83

## 2022-08-07 DIAGNOSIS — T839XXD Unspecified complication of genitourinary prosthetic device, implant and graft, subsequent encounter: Secondary | ICD-10-CM | POA: Diagnosis not present

## 2022-08-07 DIAGNOSIS — N898 Other specified noninflammatory disorders of vagina: Secondary | ICD-10-CM

## 2022-08-07 DIAGNOSIS — N939 Abnormal uterine and vaginal bleeding, unspecified: Secondary | ICD-10-CM

## 2022-08-07 LAB — WET PREP FOR TRICH, YEAST, CLUE

## 2022-08-07 NOTE — Progress Notes (Signed)
    [name] 06/15/1927 [id number]        89 y.o.  [id number]   RP: Mild vaginal bleeding with pessary  HPI: Mild vaginal bleeding with pessary, Milex ring #4 with support.  C/O occasional itching with discharge.  No pelvic pain.   OB History  Gravida Para Term Preterm AB Living  '4 2 2   2 2  '$ SAB IAB Ectopic Multiple Live Births  2            # Outcome Date GA Lbr Len/2nd Weight Sex Delivery Anes PTL Lv  4 SAB           3 SAB           2 Term           1 Term             Past medical history,surgical history, problem list, medications, allergies, family history and social history were all reviewed and documented in the EPIC chart.   Directed ROS with pertinent positives and negatives documented in the history of present illness/assessment and plan.  Exam:  Vitals:   08/07/22 1537  BP: 114/76  Pulse: 83  SpO2: 99%   General appearance:  Normal  Gynecologic exam: Vulva normal.  Pessary removed and cleaned.  Mild dark beige discharge.  Wet prep done.  No blood before removing the pessary, mild blood from stretching at pessary removal.   Gyn exam in standing position with valsalva:  Cystocele grade 3/4.  No Uterine Prolapse, no Rectocele.  Wet prep: Negative   Assessment/Plan:  89 y.o. [id number]   1. Problem with vaginal pessary, subsequent encounter Mild vaginal bleeding with pessary, Milex ring #4 with support.  C/O occasional itching with discharge.  No pelvic pain. Irritation from pessary.  No evidence of uterine bleeding.  Wet prep Neg.  Cystocele grade 3/4 today standing with Valsalva. Given the irritation d/t pessary, decision to take a break from it.  Patient reassured that she may do better without a pessary at this time.  Will come back for reinsertion as needed.    2. Vaginal discharge Wet prep Neg. - WET PREP FOR TRICH, YEAST, CLUE  3. Vaginal bleeding  Vaginal origin from pessary irritation.  Decision to take a break from the pessary.    Princess Bruins MD, 3:46 PM 08/07/2022

## 2022-08-29 ENCOUNTER — Other Ambulatory Visit: Payer: Self-pay | Admitting: Internal Medicine

## 2022-09-24 ENCOUNTER — Ambulatory Visit: Payer: Medicare Other | Admitting: Obstetrics & Gynecology

## 2022-09-27 ENCOUNTER — Ambulatory Visit: Payer: Medicare Other | Admitting: Obstetrics & Gynecology

## 2022-10-04 ENCOUNTER — Ambulatory Visit (INDEPENDENT_AMBULATORY_CARE_PROVIDER_SITE_OTHER): Payer: Medicare Other | Admitting: Obstetrics & Gynecology

## 2022-10-04 ENCOUNTER — Encounter: Payer: Self-pay | Admitting: Obstetrics & Gynecology

## 2022-10-04 VITALS — BP 120/80 | HR 85

## 2022-10-04 DIAGNOSIS — N8110 Cystocele, unspecified: Secondary | ICD-10-CM

## 2022-10-04 DIAGNOSIS — Z4689 Encounter for fitting and adjustment of other specified devices: Secondary | ICD-10-CM | POA: Diagnosis not present

## 2022-10-04 NOTE — Progress Notes (Signed)
    [name] 1928/01/06 [id number]        89 y.o.  [id number]   RP: Pessary insertion  HPI: Patient seen on 08/07/22:  Patient had mild spotting caused by vaginal irritation d/t the pessary, decision to take a break from it at that visit.  Recommended to come back for reinsertion as needed.  Since then, patient has had more difficulties emptying her bladder.  For that reason she wants the pessary reinserted.      OB History  Gravida Para Term Preterm AB Living  4 2 2   2 2   SAB IAB Ectopic Multiple Live Births  2            # Outcome Date GA Lbr Len/2nd Weight Sex Delivery Anes PTL Lv  4 SAB           3 SAB           2 Term           1 Term             Past medical history,surgical history, problem list, medications, allergies, family history and social history were all reviewed and documented in the EPIC chart.   Directed ROS with pertinent positives and negatives documented in the history of present illness/assessment and plan.  Exam:  Vitals:   10/04/22 1418  BP: 120/80  Pulse: 85  SpO2: 99%   General appearance:  Normal  Gynecologic exam: Vulva normal.  Vagina intact.  Cystocele grade 3/4.  Milex ring #4 with support easily reinserted.  Patient started to leak urine right after insertion.  Will use a pad.   Assessment/Plan:  89 y.o. [id number]   1. Pessary maintenance Patient seen on 08/07/22:  Patient had mild spotting caused by vaginal irritation d/t the pessary, decision to take a break from it at that visit.  Recommended to come back for reinsertion as needed.  Since then, patient has had more difficulties emptying her bladder.  For that reason she wants the pessary reinserted. Cystocele grade 3/4.  Milex ring #4 with support easily reinserted.  Patient started to leak urine right after insertion.  Will use a pad.  Pessary maintenance every 4-6 months.   2. Baden-Walker grade 3 cystocele  Urinary retention when not using a pessary.  Genia Del MD, 2:20  PM 10/04/2022

## 2022-10-24 ENCOUNTER — Other Ambulatory Visit: Payer: Self-pay | Admitting: Internal Medicine

## 2022-10-26 ENCOUNTER — Telehealth: Payer: Self-pay | Admitting: Radiology

## 2022-10-26 NOTE — Telephone Encounter (Signed)
Left voice mail for patient to call back at [phone] to schedule Medicare Annual Wellness Visit    Last AWV:  no hx of AWV    Please schedule Sequential/Initial AWV with [facility]   Daysean Tinkham K. CMA   

## 2022-12-10 DIAGNOSIS — H353132 Nonexudative age-related macular degeneration, bilateral, intermediate dry stage: Secondary | ICD-10-CM | POA: Diagnosis not present

## 2022-12-10 DIAGNOSIS — H26493 Other secondary cataract, bilateral: Secondary | ICD-10-CM | POA: Diagnosis not present

## 2022-12-10 DIAGNOSIS — H02051 Trichiasis without entropian right upper eyelid: Secondary | ICD-10-CM | POA: Diagnosis not present

## 2022-12-10 DIAGNOSIS — H35361 Drusen (degenerative) of macula, right eye: Secondary | ICD-10-CM | POA: Diagnosis not present

## 2022-12-24 ENCOUNTER — Other Ambulatory Visit: Payer: Self-pay | Admitting: Interventional Cardiology

## 2022-12-24 DIAGNOSIS — I4819 Other persistent atrial fibrillation: Secondary | ICD-10-CM

## 2022-12-25 NOTE — Telephone Encounter (Signed)
Prescription refill request for Eliquis received. Indication:afib Last office visit:8/23 Scr:1.19  2/24 Age: 89 Weight:63  kg  Prescription refilled

## 2023-01-02 ENCOUNTER — Encounter (INDEPENDENT_AMBULATORY_CARE_PROVIDER_SITE_OTHER): Payer: Self-pay

## 2023-01-20 ENCOUNTER — Encounter: Payer: Self-pay | Admitting: Internal Medicine

## 2023-01-20 NOTE — Patient Instructions (Addendum)
      Blood work was ordered.   The lab is on the first floor.    Medications changes include :   none     Return in about 6 months (around 07/24/2023) for follow up.

## 2023-01-20 NOTE — Progress Notes (Unsigned)
Subjective:    Patient ID: [name], female    DOB: [birth year], 89 y.o.   MRN: [id number]     HPI [name] is here for follow up of her chronic medical problems.  Doing ok - no changes.  Feels tired - not new.    Medications and allergies reviewed with patient and updated if appropriate.  Current Outpatient Medications on File Prior to Visit  Medication Sig Dispense Refill   carvedilol (COREG) 6.25 MG tablet TAKE 1 TABLET BY MOUTH TWICE A DAY 180 tablet 3   Cholecalciferol 50 MCG (2000 UT) CAPS Take 1 capsule (2,000 Units total) by mouth daily. 30 capsule    ELIQUIS 5 MG TABS tablet TAKE 1 TABLET BY MOUTH TWICE A DAY 60 tablet 5   ferrous sulfate 324 MG TBEC Take 324 mg by mouth.     furosemide (LASIX) 20 MG tablet Take 1 tablet (20 mg total) by mouth daily as needed. 30 tablet 11   levothyroxine (SYNTHROID) 75 MCG tablet TAKE 1 TABLET BY MOUTH DAILY EXCEPT TAKE 1/2 TABLET ON TUESDAYS AND SATURDAY 72 tablet 1   lisinopril (ZESTRIL) 40 MG tablet TAKE 1 TABLET BY MOUTH EVERY DAY 90 tablet 3   LORazepam (ATIVAN) 0.5 MG tablet TAKE 1/2 TABLET BY MOUTH DAILY AS NEEDED FOR ANXIETY 30 tablet 1   omeprazole (PRILOSEC) 40 MG capsule TAKE 1 CAPSULE BY MOUTH EVERY DAY 90 capsule 3   vitamin B-12 (CYANOCOBALAMIN) 1000 MCG tablet Take 1 tablet (1,000 mcg total) by mouth daily.     No current facility-administered medications on file prior to visit.     Review of Systems  Constitutional:  Positive for fatigue. Negative for appetite change and fever.  Respiratory:  Positive for shortness of breath (showering, moderate extertion). Negative for cough and wheezing.   Cardiovascular:  Positive for leg swelling (mild). Negative for chest pain and palpitations.  Neurological:  Negative for dizziness and light-headedness.       Objective:   Vitals:   01/21/23 1317  BP: 120/76  Pulse: 93  Temp: 97.6 F (36.4 C)  SpO2: 96%   BP Readings from Last 3 Encounters:  01/21/23  120/76  10/04/22 120/80  08/07/22 114/76   Wt Readings from Last 3 Encounters:  01/21/23 136 lb (61.7 kg)  07/23/22 139 lb (63 kg)  01/22/22 141 lb (64 kg)   Body mass index is 28.42 kg/m.    Physical Exam Constitutional:      General: She is not in acute distress.    Appearance: Normal appearance.  HENT:     Head: Normocephalic and atraumatic.  Eyes:     Conjunctiva/sclera: Conjunctivae normal.  Cardiovascular:     Rate and Rhythm: Normal rate. Rhythm irregular.     Heart sounds: Murmur (1/6 sys) heard.  Pulmonary:     Effort: Pulmonary effort is normal. No respiratory distress.     Breath sounds: Normal breath sounds. No wheezing.  Musculoskeletal:     Cervical back: Neck supple.     Right lower leg: Edema (trace) present.     Left lower leg: Edema (trace) present.  Lymphadenopathy:     Cervical: No cervical adenopathy.  Skin:    General: Skin is warm and dry.     Findings: No rash.  Neurological:     Mental Status: She is alert. Mental status is at baseline.  Psychiatric:        Mood and Affect: Mood normal.  Behavior: Behavior normal.        Lab Results  Component Value Date   WBC 7.3 07/23/2022   HGB 12.3 07/23/2022   HCT 36.3 07/23/2022   PLT 247.0 07/23/2022   GLUCOSE 99 07/23/2022   CHOL 195 07/23/2022   TRIG 200.0 (H) 07/23/2022   HDL 52.10 07/23/2022   LDLDIRECT 128.9 09/09/2012   LDLCALC 103 (H) 07/23/2022   ALT 8 07/23/2022   AST 12 07/23/2022   NA 133 (L) 07/23/2022   K 4.6 07/23/2022   CL 101 07/23/2022   CREATININE 1.19 07/23/2022   BUN 17 07/23/2022   CO2 24 07/23/2022   TSH 0.13 (L) 07/23/2022   INR 1.1 12/31/2016   HGBA1C 5.2 12/29/2019   MICROALBUR 0.2 04/24/2006     Assessment & Plan:    See Problem List for Assessment and Plan of chronic medical problems.

## 2023-01-21 ENCOUNTER — Ambulatory Visit (INDEPENDENT_AMBULATORY_CARE_PROVIDER_SITE_OTHER): Payer: Medicare Other | Admitting: Internal Medicine

## 2023-01-21 VITALS — BP 120/76 | HR 93 | Temp 97.6°F | Ht <= 58 in | Wt 136.0 lb

## 2023-01-21 DIAGNOSIS — E782 Mixed hyperlipidemia: Secondary | ICD-10-CM | POA: Diagnosis not present

## 2023-01-21 DIAGNOSIS — E038 Other specified hypothyroidism: Secondary | ICD-10-CM

## 2023-01-21 DIAGNOSIS — I10 Essential (primary) hypertension: Secondary | ICD-10-CM

## 2023-01-21 DIAGNOSIS — K219 Gastro-esophageal reflux disease without esophagitis: Secondary | ICD-10-CM | POA: Diagnosis not present

## 2023-01-21 DIAGNOSIS — R7989 Other specified abnormal findings of blood chemistry: Secondary | ICD-10-CM | POA: Insufficient documentation

## 2023-01-21 DIAGNOSIS — N1831 Chronic kidney disease, stage 3a: Secondary | ICD-10-CM | POA: Diagnosis not present

## 2023-01-21 DIAGNOSIS — D509 Iron deficiency anemia, unspecified: Secondary | ICD-10-CM

## 2023-01-21 DIAGNOSIS — I4819 Other persistent atrial fibrillation: Secondary | ICD-10-CM | POA: Diagnosis not present

## 2023-01-21 DIAGNOSIS — F419 Anxiety disorder, unspecified: Secondary | ICD-10-CM

## 2023-01-21 DIAGNOSIS — E559 Vitamin D deficiency, unspecified: Secondary | ICD-10-CM | POA: Diagnosis not present

## 2023-01-21 DIAGNOSIS — I700 Atherosclerosis of aorta: Secondary | ICD-10-CM | POA: Diagnosis not present

## 2023-01-21 LAB — CBC WITH DIFFERENTIAL/PLATELET
Basophils Absolute: 0 10*3/uL (ref 0.0–0.1)
Basophils Relative: 0.6 % (ref 0.0–3.0)
Eosinophils Absolute: 0.1 10*3/uL (ref 0.0–0.7)
Eosinophils Relative: 2 % (ref 0.0–5.0)
HCT: 33.5 % — ABNORMAL LOW (ref 36.0–46.0)
Hemoglobin: 10.8 g/dL — ABNORMAL LOW (ref 12.0–15.0)
Lymphocytes Relative: 23.4 % (ref 12.0–46.0)
Lymphs Abs: 1.7 10*3/uL (ref 0.7–4.0)
MCHC: 32.1 g/dL (ref 30.0–36.0)
MCV: 88.7 fl (ref 78.0–100.0)
Monocytes Absolute: 0.6 10*3/uL (ref 0.1–1.0)
Monocytes Relative: 8.2 % (ref 3.0–12.0)
Neutro Abs: 4.7 10*3/uL (ref 1.4–7.7)
Neutrophils Relative %: 65.8 % (ref 43.0–77.0)
Platelets: 290 10*3/uL (ref 150.0–400.0)
RBC: 3.78 Mil/uL — ABNORMAL LOW (ref 3.87–5.11)
RDW: 15.2 % (ref 11.5–15.5)
WBC: 7.1 10*3/uL (ref 4.0–10.5)

## 2023-01-21 LAB — FERRITIN: Ferritin: 17.9 ng/mL (ref 10.0–291.0)

## 2023-01-21 LAB — TSH: TSH: 0.21 u[IU]/mL — ABNORMAL LOW (ref 0.35–5.50)

## 2023-01-21 LAB — VITAMIN D 25 HYDROXY (VIT D DEFICIENCY, FRACTURES): VITD: 31.13 ng/mL (ref 30.00–100.00)

## 2023-01-21 NOTE — Assessment & Plan Note (Signed)
Chronic  Clinically euthyroid Check tsh and will titrate med dose if needed Currently taking levothyroxine 75 mcg 5 days a week, 37.5 mcg 2 days a week

## 2023-01-21 NOTE — Assessment & Plan Note (Signed)
Chronic Follows with cardiology Continue carvedilol 6.25 mg twice daily and Eliquis 5 mg twice daily CBC, CMP, TSH

## 2023-01-21 NOTE — Assessment & Plan Note (Signed)
Chronic GERD controlled, but she does have intermittent feeling of sickness or nausea which is likely GERD Continue omeprazole 40 mg daily She did try Pepcid at night but that did not seem to help Tums does help-okay to continue Tums as needed

## 2023-01-21 NOTE — Assessment & Plan Note (Signed)
Chronic Has not tolerated > 1 statin, given age benefits of statins likely do not outweigh risks Healthy diet and staying active encouraged

## 2023-01-21 NOTE — Assessment & Plan Note (Signed)
Chronic Check CBC, iron panel On Eliquis 5 mg twice daily Taking iron daily

## 2023-01-21 NOTE — Assessment & Plan Note (Signed)
Chronic Blood pressure well controlled CMP Continue Coreg 6.25 mg twice daily, lisinopril 40 mg daily

## 2023-01-21 NOTE — Assessment & Plan Note (Signed)
Chronic Overall controlled Continue lorazepam 0.25 mg daily as needed-she does not take this on a daily basis

## 2023-01-21 NOTE — Assessment & Plan Note (Signed)
Chronic Taking vitamin D daily Check vitamin D level  

## 2023-01-21 NOTE — Assessment & Plan Note (Signed)
Low B12 in past Check B12 level

## 2023-01-21 NOTE — Assessment & Plan Note (Signed)
Chronic Fairly stable CMP, CBC

## 2023-01-21 NOTE — Assessment & Plan Note (Signed)
Chronic Healthy diet encouraged Check lipid panel  Intolerant of statins Given age  - continue lifestyle control

## 2023-01-22 LAB — COMPREHENSIVE METABOLIC PANEL
ALT: 5 U/L (ref 0–35)
AST: 11 U/L (ref 0–37)
Albumin: 3.9 g/dL (ref 3.5–5.2)
Alkaline Phosphatase: 73 U/L (ref 39–117)
BUN: 20 mg/dL (ref 6–23)
CO2: 21 meq/L (ref 19–32)
Calcium: 9.3 mg/dL (ref 8.4–10.5)
Chloride: 106 meq/L (ref 96–112)
Creatinine, Ser: 1.07 mg/dL (ref 0.40–1.20)
GFR: 44.95 mL/min — ABNORMAL LOW (ref >60.00)
Glucose, Bld: 97 mg/dL (ref 70–99)
Potassium: 4.2 mEq/L (ref 3.5–5.1)
Sodium: 137 meq/L (ref 135–145)
Total Bilirubin: 0.4 mg/dL (ref 0.2–1.2)
Total Protein: 6.7 g/dL (ref 6.0–8.3)

## 2023-01-22 LAB — LIPID PANEL
Cholesterol: 188 mg/dL (ref 0–200)
HDL: 46.1 mg/dL (ref >39.00)
LDL Cholesterol: 109 mg/dL — ABNORMAL HIGH (ref 0–99)
NonHDL: 141.87
Total CHOL/HDL Ratio: 4
Triglycerides: 164 mg/dL — ABNORMAL HIGH (ref 0.0–149.0)
VLDL: 32.8 mg/dL (ref 0.0–40.0)

## 2023-01-22 LAB — IBC PANEL
Iron: 49 ug/dL (ref 42–145)
Saturation Ratios: 11.5 % — ABNORMAL LOW (ref 20.0–50.0)
TIBC: 427 ug/dL (ref 250.0–450.0)
Transferrin: 305 mg/dL (ref 212.0–360.0)

## 2023-01-24 ENCOUNTER — Telehealth: Payer: Self-pay | Admitting: Internal Medicine

## 2023-01-24 NOTE — Telephone Encounter (Signed)
Patient dropped off document Surgical Clearance, to be filled out by provider. Patient requested to send it back via Fax within 7-days. Document is located in providers tray at front office.Please advise at Mobile [phone] (mobile)

## 2023-01-25 ENCOUNTER — Other Ambulatory Visit: Payer: Self-pay | Admitting: Internal Medicine

## 2023-01-25 ENCOUNTER — Other Ambulatory Visit: Payer: Self-pay | Admitting: Interventional Cardiology

## 2023-01-25 NOTE — Telephone Encounter (Signed)
Form placed in Dr. Lawerance Bach blue folder to complete.

## 2023-01-26 ENCOUNTER — Other Ambulatory Visit: Payer: Self-pay | Admitting: Internal Medicine

## 2023-01-26 MED ORDER — LEVOTHYROXINE SODIUM 75 MCG PO TABS: ORAL_TABLET | ORAL | 1 refills | Status: DC

## 2023-01-28 ENCOUNTER — Telehealth: Payer: Self-pay | Admitting: *Deleted

## 2023-01-28 NOTE — Telephone Encounter (Signed)
Clearance faxed today 

## 2023-01-28 NOTE — Telephone Encounter (Signed)
   Pre-operative Risk Assessment    Patient Name: [name]  DOB: 1928-03-21 MRN: [id number]     Request for Surgical Clearance    Procedure:  Dental Extraction - Amount of Teeth to be Pulled:  6 CROWNS ONLY  Date of Surgery:  Clearance TBD                                 Surgeon:  Cheryll Cockayne, MD Surgeon's Group or Practice Name:  [facility] Phone number:  [phone] Fax number:  [phone]   Type of Clearance Requested:   - Medical  - Pharmacy:  Hold Apixaban (Eliquis) NOT INDICATED   Type of Anesthesia:  Local    Additional requests/questions:    Wilhemina Cash   01/28/2023, 3:56 PM

## 2023-01-29 NOTE — Telephone Encounter (Signed)
Patient with diagnosis of afib on Eliquis for anticoagulation.    Procedure: 6 crowns Date of procedure: TBD  CHA2DS2-VASc Score = 5  This indicates a 7.2% annual risk of stroke. The patient's score is based upon: CHF History: 0 HTN History: 1 Diabetes History: 0 Stroke History: 0 Vascular Disease History: 1 Age Score: 2 Gender Score: 1   CrCl 42mL/min Platelet count 290K  Patient does not require pre-op antibiotics for dental procedure.  Per office protocol, patient can hold Eliquis for 1 day prior to procedure.    **This guidance is not considered finalized until pre-operative APP has relayed final recommendations.**

## 2023-01-29 NOTE — Telephone Encounter (Signed)
   Name: [name]  DOB: December 24, 1927  MRN: [id number]  Primary Cardiologist: Lance Muss, MD  Chart reviewed as part of pre-operative protocol coverage. Because of [name]'s past medical history and time since last visit, she will require a follow-up in-office visit in order to better assess preoperative cardiovascular risk.  Patient has an office visit scheduled with Dr. Eldridge Dace on 03/13/2023. Appointment notes have been updated to reflect need for pre-op evaluation.   Pre-op covering staff:  - Please contact requesting surgeon's office via preferred method (i.e, phone, fax) to inform them of need for appointment prior to surgery.  This message will also be routed to pharmacy pool for input on holding Eliquis as requested below so that this information is available to the clearing provider at time of patient's appointment.   Carlos Levering, NP  01/29/2023, 11:25 AM

## 2023-01-29 NOTE — Telephone Encounter (Signed)
Please advise holding Eliquis prior to 6 crowns.   Thank you!  DW

## 2023-02-05 ENCOUNTER — Encounter: Payer: Self-pay | Admitting: Radiology

## 2023-02-05 ENCOUNTER — Ambulatory Visit (INDEPENDENT_AMBULATORY_CARE_PROVIDER_SITE_OTHER): Payer: Medicare Other | Admitting: Radiology

## 2023-02-05 VITALS — BP 126/80

## 2023-02-05 DIAGNOSIS — Z4689 Encounter for fitting and adjustment of other specified devices: Secondary | ICD-10-CM

## 2023-02-05 DIAGNOSIS — N760 Acute vaginitis: Secondary | ICD-10-CM | POA: Diagnosis not present

## 2023-02-05 DIAGNOSIS — B9689 Other specified bacterial agents as the cause of diseases classified elsewhere: Secondary | ICD-10-CM | POA: Diagnosis not present

## 2023-02-05 LAB — WET PREP FOR TRICH, YEAST, CLUE

## 2023-02-05 MED ORDER — TINIDAZOLE 500 MG PO TABS: 1.0000 g | ORAL_TABLET | Freq: Every day | ORAL | 0 refills | Status: AC

## 2023-02-05 NOTE — Progress Notes (Signed)
Subjective: [name] is a 89 y.o. female who complains of vaginal odor with pessary. Has not noticed any discharge. Denies any bleeding or incontinence.    Review of Systems  All other systems reviewed and are negative.   Past Medical History:  Diagnosis Date   Adrenal myelolipoma    Apical variant hypertrophic cardiomyopathy (HCC)    Echo 4/18: Normal LV size with marked apical hypertrophy. LVEF 60-65%. No WMA. Mild MR. Moderate left atrial enlargement. Normal RV size and function. Mild right atrial enlargemen   Chronic kidney disease    DIVERTICULOSIS, COLON    Dysmetabolic syndrome X    FIBROMYALGIA    Gastric ulcer due to Helicobacter pylori    Hiatal hernia    Hx of cataract surgery 01/08/2016   HYPERLIPIDEMIA    Hyperplastic colon polyp    HYPERTENSION, ESSENTIAL NOS    Hyponatremia    Hypothyroidism    MYCOSIS FUNGOIDES    Nodule of left lung    Normocytic anemia    Olecranon fracture 01/07/2016   OSTEOARTHRITIS    OSTEOPOROSIS    Persistent atrial fibrillation (HCC)    s/p DCCV 2018 // Apixaban for anticoag   Renal angiomyolipoma    Unspecified vitamin D deficiency     Allergies  Allergen Reactions   Achromycin [Tetracycline Hcl] Swelling and Other (See Comments)    Reaction:  Facial swelling    Celecoxib Diarrhea   Clarithromycin Other (See Comments)    Reaction:  Unknown    Ezetimibe-Simvastatin Diarrhea, Nausea And Vomiting and Other (See Comments)    Reaction:  Muscle pain    Flagyl [Metronidazole] Other (See Comments)    hallucinations   Penicillins Anaphylaxis and Other (See Comments)    Has patient had a PCN reaction causing immediate rash, facial/tongue/throat swelling, SOB or lightheadedness with hypotension: Yes Has patient had a PCN reaction causing severe rash involving mucus membranes or skin necrosis: No Has patient had a PCN reaction that required hospitalization No Has patient had a PCN reaction occurring within the last 10  years: No If all of the above answers are "NO", then may proceed with Cephalosporin use.   Simvastatin Other (See Comments)    Reaction:  Muscle pain    Clonazepam     SOB, weakness   Erythromycin    Sertraline     hyponatremia   Tramadol     Woozy, off balanced   Abilify [Aripiprazole] Other (See Comments)    Reaction:  Insomnia      Objective:  Today's Vitals   02/05/23 1515  BP: 126/80   There is no height or weight on file to calculate BMI.   Physical Exam Vitals and nursing note reviewed. Exam conducted with a chaperone present.  Constitutional:      Appearance: Normal appearance.  Pulmonary:     Effort: Pulmonary effort is normal.  Genitourinary:    General: Normal vulva.     Labia:        Right: No rash or tenderness.        Left: No rash or tenderness.      Vagina: Vaginal discharge present.     Uterus: With uterine prolapse.      Comments: Milex ring #4 with support removed easily. A tan discharge was present in the vagina and a sample was taken for wet mount. No bleed or excoriation appreciated. Pessary cleaned and replaced without difficulty. Neurological:     Mental Status: She is alert.  Microscopic wet-mount exam shows clue cells.   Raynelle Fanning, CMA present for exam  Assessment:/Plan:   1. Pessary maintenance Tolerated well, follow up in 3 months  2. BV (bacterial vaginosis) - tinidazole (TINDAMAX) 500 MG tablet; Take 2 tablets (1,000 mg total) by mouth daily with breakfast for 5 days.  Dispense: 10 tablet; Refill: 0 - WET PREP FOR TRICH, YEAST, CLUE

## 2023-02-14 ENCOUNTER — Telehealth: Payer: Self-pay | Admitting: Internal Medicine

## 2023-02-14 NOTE — Telephone Encounter (Signed)
Message left for patient today.  If she calls back please see what she needs regarding the Omeprazole.

## 2023-02-14 NOTE — Telephone Encounter (Signed)
Patient's daughter said to just forget it -

## 2023-02-14 NOTE — Telephone Encounter (Signed)
Pt want a phone call back from nurse about a medication. Please advise   omeprazole (PRILOSEC) 40 MG capsule

## 2023-02-23 ENCOUNTER — Other Ambulatory Visit: Payer: Self-pay | Admitting: Internal Medicine

## 2023-03-13 ENCOUNTER — Ambulatory Visit: Payer: Medicare Other | Admitting: Interventional Cardiology

## 2023-03-13 DIAGNOSIS — W19XXXA Unspecified fall, initial encounter: Secondary | ICD-10-CM | POA: Diagnosis not present

## 2023-03-13 DIAGNOSIS — S80919A Unspecified superficial injury of unspecified knee, initial encounter: Secondary | ICD-10-CM | POA: Diagnosis not present

## 2023-03-13 DIAGNOSIS — I4891 Unspecified atrial fibrillation: Secondary | ICD-10-CM | POA: Diagnosis not present

## 2023-03-15 DIAGNOSIS — Z23 Encounter for immunization: Secondary | ICD-10-CM | POA: Diagnosis not present

## 2023-03-25 DIAGNOSIS — Z23 Encounter for immunization: Secondary | ICD-10-CM | POA: Diagnosis not present

## 2023-04-22 ENCOUNTER — Other Ambulatory Visit: Payer: Self-pay | Admitting: Interventional Cardiology

## 2023-05-07 ENCOUNTER — Ambulatory Visit: Payer: Medicare Other | Admitting: Physician Assistant

## 2023-05-07 ENCOUNTER — Encounter: Payer: Self-pay | Admitting: Radiology

## 2023-05-07 ENCOUNTER — Ambulatory Visit (INDEPENDENT_AMBULATORY_CARE_PROVIDER_SITE_OTHER): Payer: Medicare Other | Admitting: Radiology

## 2023-05-07 VITALS — BP 134/72

## 2023-05-07 DIAGNOSIS — Z4689 Encounter for fitting and adjustment of other specified devices: Secondary | ICD-10-CM | POA: Diagnosis not present

## 2023-05-07 NOTE — Progress Notes (Signed)
   Subjective:     History of Present Illness: [name] is a 89 y.o. female with stage III pelvic organ prolapse who presents for a pessary check. She is using a size 4 ring with support pessary. The pessary has been working well and she has no complaints. She is not using vaginal estrogen. She denies vaginal bleeding.  Past Medical History: Patient  has a past medical history of Adrenal myelolipoma, Apical variant hypertrophic cardiomyopathy (HCC), Chronic kidney disease, DIVERTICULOSIS, COLON, Dysmetabolic syndrome X, FIBROMYALGIA, Gastric ulcer due to Helicobacter pylori, Hiatal hernia, cataract surgery (01/08/2016), HYPERLIPIDEMIA, Hyperplastic colon polyp, HYPERTENSION, ESSENTIAL NOS, Hyponatremia, Hypothyroidism, MYCOSIS FUNGOIDES, Nodule of left lung, Normocytic anemia, Olecranon fracture (01/07/2016), OSTEOARTHRITIS, OSTEOPOROSIS, Persistent atrial fibrillation (HCC), Renal angiomyolipoma, and Unspecified vitamin D deficiency.   Past Surgical History: She  has a past surgical history that includes G 4 P 2; colonoscopy with polypectomy (07/25/2007); ORIF elbow fracture (Right, 01/08/2016); I & D extremity (Right, 01/08/2016); and Cardioversion (N/A, 01/03/2017).   Medications: She has a current medication list which includes the following prescription(s): arexvy, carvedilol, cholecalciferol, eliquis, ferrous sulfate, furosemide, levothyroxine, lisinopril, lorazepam, omeprazole, and cyanocobalamin.   Allergies: Patient is allergic to achromycin [tetracycline hcl], celecoxib, clarithromycin, ezetimibe-simvastatin, flagyl [metronidazole], penicillins, simvastatin, clonazepam, erythromycin, sertraline, tramadol, and abilify [aripiprazole].       Objective:    Physical Exam: BP 134/72 (BP Location: Left Arm, Patient Position: Sitting, Cuff Size: Normal)   LMP  (LMP Unknown) Comment: not sexually Gen: No apparent distress, A&O x 3. Detailed Urogynecologic Evaluation:  Pelvic Exam: Normal  external female genitalia; Bartholin's and Skene's glands normal in appearance; urethral meatus normal in appearance, no urethral masses or discharge. The pessary was noted to be in place. It was removed and cleaned. Speculum exam revealed no lesions in the vagina. The pessary was replaced. It was comfortable to the patient and fit well.    Assessment/Plan:    1. Pessary maintenance  She will keep the pessary in place until next visit.  She will follow-up in 3 months for a pessary check or sooner as needed.  All questions were answered.  Donie Moulton B, NP 1:59 PM

## 2023-05-09 ENCOUNTER — Encounter: Payer: Self-pay | Admitting: Physician Assistant

## 2023-05-09 ENCOUNTER — Ambulatory Visit: Payer: Medicare Other | Attending: Interventional Cardiology | Admitting: Physician Assistant

## 2023-05-09 VITALS — BP 138/72 | HR 70 | Ht <= 58 in | Wt 130.0 lb

## 2023-05-09 DIAGNOSIS — E782 Mixed hyperlipidemia: Secondary | ICD-10-CM | POA: Insufficient documentation

## 2023-05-09 DIAGNOSIS — E785 Hyperlipidemia, unspecified: Secondary | ICD-10-CM | POA: Diagnosis not present

## 2023-05-09 DIAGNOSIS — I4819 Other persistent atrial fibrillation: Secondary | ICD-10-CM | POA: Diagnosis not present

## 2023-05-09 DIAGNOSIS — I10 Essential (primary) hypertension: Secondary | ICD-10-CM | POA: Insufficient documentation

## 2023-05-09 DIAGNOSIS — R296 Repeated falls: Secondary | ICD-10-CM | POA: Diagnosis not present

## 2023-05-09 DIAGNOSIS — I422 Other hypertrophic cardiomyopathy: Secondary | ICD-10-CM | POA: Insufficient documentation

## 2023-05-09 NOTE — Progress Notes (Addendum)
Cardiology Office Note:  .   Date:  05/09/2023  ID:  [name], DOB Aug 05, 1927, MRN [id number] PCP: Pincus Sanes, MD  [facility] Providers Cardiologist:  Lance Muss, MD {  History of Present Illness: .   [name] is a 89 y.o. female previously seen by Dr. Okey Dupre with a history of persistent atrial fibrillation, apical variant hypertrophic cardiomyopathy, hypertension, hyperlipidemia, chronic kidney disease, and anemia.  She is here for follow-up visit and preop evaluation.  She underwent cardioversion in 2018.  Her beta-blocker dose was reduced due to symptoms of fatigue and DOE.  She was feeling better at her visit 02/2018.  Decided to switch her from beta-blocker to calcium channel blocker.  However, she did not tolerate this and was switched back to a beta-blocker.  CHA2DS2-VASc score was noted to be 4 at that time.  Other history includes some DOE.  Also has some anxiety when she wakes up which was worse with COVID.  She got her vaccination shots.  In 2021 she stated she felt sick when she takes Lasix.  Reported to not have energy and had not been exercising much.  Feels like the last year her stamina has decreased.  She does not drive.  She does do well with a cart at the store.  Occasional leg swelling but by the morning they are normally back down.  Was seen 4/22 and carvedilol was increased for better control.  Lasix use was also encouraged.  She felt well since that time.  She fell once in 2021.  Had a CT scan luckily none since then.  When she was seen January 15, 2022 she denied chest pain, dizziness, leg edema, nitroglycerin use, orthopnea, palpitations, PND, shortness of breath, and syncope.  She does use a walker at times.  Today, she presents with a history of atrial fibrillation, hypertension, and arthritis, presents with a general feeling of unwellness. She reports occasional nausea, for which she takes over-the-counter Emetrol as needed, which seems to  provide some relief. She also experiences difficulty sleeping, often staying up late to watch television and then taking naps during the day. She has tried melatonin in the past but did not find it effective. She also reports shortness of breath after exertion, such as after taking a shower and dressing, but this has not worsened over the past year.  The patient uses a walker for mobility due to a history of falls, the most recent of which occurred about a month ago when her knee gave out. She did not sustain any injuries from the fall. She has stopped driving and relies on her daughter for transportation. She expresses some frustration with her decreased independence but acknowledges the safety concerns of driving at her age.  The patient has been off Lasix for over a year due to side effects and reports no swelling. She takes Eliquis for atrial fibrillation, carvedilol and lisinopril for hypertension, and lorazepam as needed for anxiety. She also takes over-the-counter arthritis strength Tylenol for arthritis pain.  Reports no chest pain, pressure, or tightness. No edema, orthopnea, PND. Reports no palpitations.   Discussed the use of AI scribe software for clinical note transcription with the patient, who gave verbal consent to proceed.  ROS: Pertinent ROS in HPI  Studies Reviewed: .       Echo 09/14/16 Study Conclusions   - Left ventricle: The cavity size was normal. Systolic function was    normal. The estimated ejection fraction was in the  range of 60%    to 65%. Wall motion was normal; there were no regional wall    motion abnormalities. The study was not technically sufficient to    allow evaluation of LV diastolic dysfunction due to atrial    fibrillation.  - Aortic valve: Trileaflet; normal thickness leaflets. There was no    regurgitation.  - Aortic root: The aortic root was normal in size.  - Ascending aorta: The ascending aorta was normal in size.  - Mitral valve: Structurally  normal valve. There was mild    regurgitation.  - Left atrium: The atrium was moderately dilated.  - Right ventricle: The cavity size was normal. Wall thickness was    normal. Systolic function was normal.  - Right atrium: The atrium was mildly dilated.  - Tricuspid valve: There was moderate regurgitation.  - Pulmonic valve: There was trivial regurgitation.  - Pulmonary arteries: Systolic pressure was within the normal    range. PA peak pressure: 33 mm Hg (S).  - Inferior vena cava: The vessel was normal in size.  - Pericardium, extracardiac: There was no pericardial effusion.   Impressions:   - There is severe apical hypertrophy consistent with apical form of    hypertrophic cardiomyopathy.       Physical Exam:   VS:  BP 138/72   Pulse 70   Ht 4\' 10"  (1.473 m)   Wt 130 lb (59 kg)   LMP  (LMP Unknown) Comment: not sexually  SpO2 100%   BMI 27.17 kg/m    Wt Readings from Last 3 Encounters:  05/09/23 130 lb (59 kg)  01/21/23 136 lb (61.7 kg)  07/23/22 139 lb (63 kg)    GEN: Well nourished, well developed in no acute distress NECK: No JVD; No carotid bruits CARDIAC: IRIR, no murmurs, rubs, gallops RESPIRATORY:  Clear to auscultation without rales, wheezing or rhonchi  ABDOMEN: Soft, non-tender, non-distended EXTREMITIES:  No edema; No deformity   ASSESSMENT AND PLAN: .     Fall Risk History of falls, most recent one month ago. No injuries reported. Uses a walker for mobility. -Continue use of walker at all times to prevent falls.  Shortness of Breath Reports shortness of breath after showering and dressing, but improves with rest. No change in symptoms over the past year. She is not taking lasix due to side effects -Continue current management.  Hypertension Blood pressure well controlled on current regimen of Carvedilol and Lisinopril. -Continue Carvedilol and Lisinopril.  Atrial Fibrillation No reported symptoms. On Eliquis for anticoagulation. -Continue  Eliquis. No reported bleeding issues.  Chronic Edema Reports no need for Lasix due to absence of swelling and adverse effects from the medication. - Encourage leg elevation and consider compression stockings if swelling increases.  Insomnia Reports difficulty sleeping and excessive daytime napping. Currently on Lorazepam as needed. -Consider taking Lorazepam at night for sleep. Suggested over-the-counter Melatonin 3-5mg  one hour before bedtime.  Nutrition Reports low appetite and frequent diarrhea. Consumes yogurt daily. -Suggested trial of Ensure protein drinks for additional nutrition.  Follow-up Physical exam revealed slight edema in right leg, possibly related to previous knee injury. No other significant findings. -Schedule routine follow-up visit.    Signed, Sharlene Dory, PA-C

## 2023-05-09 NOTE — Patient Instructions (Signed)
Medication Instructions:  Your physician recommends that you continue on your current medications as directed. Please refer to the Current Medication list given to you today. *If you need a refill on your cardiac medications before your next appointment, please call your pharmacy*   Lab Work: None Ordered   Testing/Procedures: None ordered   Follow-Up: At [facility], you and your health needs are our priority.  As part of our continuing mission to provide you with exceptional heart care, we have created designated Provider Care Teams.  These Care Teams include your primary Cardiologist (physician) and Advanced Practice Providers (APPs -  Physician Assistants and Nurse Practitioners) who all work together to provide you with the care you need, when you need it.  We recommend signing up for the patient portal called "MyChart".  Sign up information is provided on this After Visit Summary.  MyChart is used to connect with patients for Virtual Visits (Telemedicine).  Patients are able to view lab/test results, encounter notes, upcoming appointments, etc.  Non-urgent messages can be sent to your provider as well.   To learn more about what you can do with MyChart, go to [email or web address].    Your next appointment:   12 month(s)  Provider:   Jari Favre, PA-C  Other Instructions

## 2023-05-29 ENCOUNTER — Other Ambulatory Visit: Payer: Self-pay | Admitting: Interventional Cardiology

## 2023-06-11 ENCOUNTER — Other Ambulatory Visit: Payer: Self-pay | Admitting: Physician Assistant

## 2023-06-11 DIAGNOSIS — I4819 Other persistent atrial fibrillation: Secondary | ICD-10-CM

## 2023-06-11 NOTE — Telephone Encounter (Signed)
 Prescription refill request for Eliquis received. Indication:afib Last office visit:12/24 Scr:1.07  8/24 Age: 89 Weight:59  kg  Prescription refilled

## 2023-06-13 ENCOUNTER — Emergency Department (HOSPITAL_COMMUNITY): Payer: Medicare Other

## 2023-06-13 ENCOUNTER — Emergency Department (HOSPITAL_COMMUNITY)
Admission: EM | Admit: 2023-06-13 | Discharge: 2023-06-13 | Disposition: A | Discharge status: Home / Self Care | Payer: Medicare Other | Attending: Emergency Medicine | Admitting: Emergency Medicine

## 2023-06-13 ENCOUNTER — Other Ambulatory Visit: Payer: Self-pay

## 2023-06-13 DIAGNOSIS — N189 Chronic kidney disease, unspecified: Secondary | ICD-10-CM | POA: Diagnosis not present

## 2023-06-13 DIAGNOSIS — W19XXXA Unspecified fall, initial encounter: Secondary | ICD-10-CM

## 2023-06-13 DIAGNOSIS — Z7901 Long term (current) use of anticoagulants: Secondary | ICD-10-CM | POA: Insufficient documentation

## 2023-06-13 DIAGNOSIS — Y9301 Activity, walking, marching and hiking: Secondary | ICD-10-CM | POA: Diagnosis not present

## 2023-06-13 DIAGNOSIS — Z23 Encounter for immunization: Secondary | ICD-10-CM | POA: Diagnosis not present

## 2023-06-13 DIAGNOSIS — R58 Hemorrhage, not elsewhere classified: Secondary | ICD-10-CM | POA: Diagnosis not present

## 2023-06-13 DIAGNOSIS — Z043 Encounter for examination and observation following other accident: Secondary | ICD-10-CM | POA: Diagnosis not present

## 2023-06-13 DIAGNOSIS — I6789 Other cerebrovascular disease: Secondary | ICD-10-CM | POA: Diagnosis not present

## 2023-06-13 DIAGNOSIS — S0990XA Unspecified injury of head, initial encounter: Secondary | ICD-10-CM | POA: Insufficient documentation

## 2023-06-13 DIAGNOSIS — S51012A Laceration without foreign body of left elbow, initial encounter: Secondary | ICD-10-CM | POA: Insufficient documentation

## 2023-06-13 DIAGNOSIS — W1839XA Other fall on same level, initial encounter: Secondary | ICD-10-CM | POA: Diagnosis not present

## 2023-06-13 DIAGNOSIS — R197 Diarrhea, unspecified: Secondary | ICD-10-CM | POA: Insufficient documentation

## 2023-06-13 DIAGNOSIS — Z79899 Other long term (current) drug therapy: Secondary | ICD-10-CM | POA: Diagnosis not present

## 2023-06-13 DIAGNOSIS — M25522 Pain in left elbow: Secondary | ICD-10-CM | POA: Diagnosis not present

## 2023-06-13 DIAGNOSIS — I129 Hypertensive chronic kidney disease with stage 1 through stage 4 chronic kidney disease, or unspecified chronic kidney disease: Secondary | ICD-10-CM | POA: Diagnosis not present

## 2023-06-13 DIAGNOSIS — E039 Hypothyroidism, unspecified: Secondary | ICD-10-CM | POA: Diagnosis not present

## 2023-06-13 MED ORDER — BACITRACIN ZINC 500 UNIT/GM EX OINT
TOPICAL_OINTMENT | CUTANEOUS | Status: AC
  Administered 2023-06-13: 3
  Filled 2023-06-13: qty 2.7

## 2023-06-13 MED ORDER — LIDOCAINE-EPINEPHRINE (PF) 2 %-1:200000 IJ SOLN
20.0000 mL | Freq: Once | INTRAMUSCULAR | Status: AC
  Administered 2023-06-13: 20 mL
  Filled 2023-06-13: qty 20

## 2023-06-13 MED ORDER — LOPERAMIDE HCL 2 MG PO CAPS
4.0000 mg | ORAL_CAPSULE | Freq: Once | ORAL | Status: AC
  Administered 2023-06-13: 4 mg via ORAL
  Filled 2023-06-13: qty 2

## 2023-06-13 MED ORDER — TETANUS-DIPHTH-ACELL PERTUSSIS 5-2.5-18.5 LF-MCG/0.5 IM SUSY
0.5000 mL | PREFILLED_SYRINGE | Freq: Once | INTRAMUSCULAR | Status: AC
  Administered 2023-06-13: 0.5 mL via INTRAMUSCULAR
  Filled 2023-06-13: qty 0.5

## 2023-06-13 NOTE — ED Provider Notes (Signed)
 [place] EMERGENCY DEPARTMENT AT [facility] Provider Note   CSN: [id number] Arrival date & time: 06/13/23  0131     History  Chief Complaint  Patient presents with   [name]    [name] is a 89 y.o. female.  HPI     This is a 89 year old female who presents with concern for left arm injury.  Patient lives alone.  She states that she got up to the bathroom.  She has recurrent diarrhea which is not new for her.  She got up fairly quickly and was using her walker when she fell.  She believes she hit her left arm on her nightstand.  She had difficulty getting up on her own.  She denies hitting her head or loss of consciousness but she is on Eliquis .  Home Medications Prior to Admission medications   Medication Sig Start Date End Date Taking? Authorizing Provider  AREXVY 120 MCG/0.5ML injection  03/25/23   [provider]  carvedilol  (COREG ) 6.25 MG tablet TAKE 1 TABLET BY MOUTH TWICE A DAY 05/30/23   Lucien Orren SAILOR, PA-C  Cholecalciferol  50 MCG (2000 UT) CAPS Take 1 capsule (2,000 Units total) by mouth daily. 07/01/20   Geofm Glade PARAS, MD  ELIQUIS  5 MG TABS tablet TAKE 1 TABLET BY MOUTH TWICE A DAY 06/11/23   Lucien Orren SAILOR, PA-C  ferrous sulfate  324 MG TBEC Take 324 mg by mouth.    [provider]  levothyroxine  (SYNTHROID ) 75 MCG tablet TAKE 1 TABLET BY MOUTH DAILY EXCEPT TAKE 1/2 TABLET ON TUESDAYS, THURSDAY AND SATURDAY 01/26/23   Geofm Glade PARAS, MD  lisinopril  (ZESTRIL ) 40 MG tablet TAKE 1 TABLET BY MOUTH EVERY DAY 04/23/23   Dann Candyce RAMAN, MD  LORazepam  (ATIVAN ) 0.5 MG tablet TAKE 1/2 TABLET BY MOUTH DAILY AS NEEDED FOR ANXIETY 02/25/23   Burns, Glade PARAS, MD  omeprazole  (PRILOSEC) 40 MG capsule TAKE 1 CAPSULE BY MOUTH EVERY DAY 01/28/23   Geofm Glade PARAS, MD  vitamin B-12 (CYANOCOBALAMIN ) 1000 MCG tablet Take 1 tablet (1,000 mcg total) by mouth daily. 07/01/20   Geofm Glade PARAS, MD      Allergies    Achromycin [tetracycline hcl], Celecoxib,  Clarithromycin , Ezetimibe-simvastatin , Flagyl  [metronidazole ], Penicillins, Simvastatin , Clonazepam , Erythromycin, Sertraline , Tramadol , and Abilify  [aripiprazole ]    Review of Systems   Review of Systems  Constitutional:  Negative for fever.  Respiratory:  Negative for shortness of breath.   Cardiovascular:  Negative for chest pain.  Musculoskeletal:        Elbow pain  All other systems reviewed and are negative.   Physical Exam Updated Vital Signs BP 121/72 (BP Location: Right Arm)   Pulse 81   Temp 97.7 F (36.5 C) (Oral)   Resp 18   LMP  (LMP Unknown) Comment: not sexually  SpO2 100%  Physical Exam Vitals and nursing note reviewed.  Constitutional:      Appearance: She is well-developed.     Comments: Elderly, well-appearing, nontoxic  HENT:     Head: Normocephalic and atraumatic.  Eyes:     Pupils: Pupils are equal, round, and reactive to light.  Cardiovascular:     Rate and Rhythm: Normal rate and regular rhythm.     Heart sounds: Normal heart sounds.  Pulmonary:     Effort: Pulmonary effort is normal. No respiratory distress.     Breath sounds: No wheezing.  Abdominal:     Palpations: Abdomen is soft.     Tenderness: There is  no abdominal tenderness.  Musculoskeletal:     Cervical back: Neck supple.     Comments: Laceration with skin tear noted to the left elbow, adjacent bruising noted, normal range of motion of the elbow, 2+ radial pulse  Skin:    General: Skin is warm and dry.  Neurological:     Mental Status: She is alert and oriented to person, place, and time.  Psychiatric:        Mood and Affect: Mood normal.     ED Results / Procedures / Treatments   Labs (all labs ordered are listed, but only abnormal results are displayed) Labs Reviewed - No data to display  EKG None  Radiology CT Head Wo Contrast Result Date: 06/13/2023 CLINICAL DATA:  Head trauma, minor (Age >= 65y).  Fall. EXAM: CT HEAD WITHOUT CONTRAST TECHNIQUE: Contiguous axial images  were obtained from the base of the skull through the vertex without intravenous contrast. RADIATION DOSE REDUCTION: This exam was performed according to the departmental dose-optimization program which includes automated exposure control, adjustment of the mA and/or kV according to patient size and/or use of iterative reconstruction technique. COMPARISON:  12/03/2019 FINDINGS: Brain: There is atrophy and chronic small vessel disease changes. No acute intracranial abnormality. Specifically, no hemorrhage, hydrocephalus, mass lesion, acute infarction, or significant intracranial injury. Vascular: No hyperdense vessel or unexpected calcification. Skull: No acute calvarial abnormality. Sinuses/Orbits: No acute findings Other: None IMPRESSION: Atrophy, chronic microvascular disease. No acute intracranial abnormality. Electronically Signed   By: Franky Crease M.D.   On: 06/13/2023 03:03   DG Elbow Complete Left Result Date: 06/13/2023 CLINICAL DATA:  Clemens at home this morning and hit elbow on floor, laceration to the olecranon area. Pain and bruising appears limited movement EXAM: LEFT ELBOW - COMPLETE 3+ VIEW COMPARISON:  12/03/2019 FINDINGS: No acute fracture or dislocation. Laceration about the proximal forearm. Soft tissue swelling about the elbow. IMPRESSION: No acute fracture or dislocation. Electronically Signed   By: Norman Gatlin M.D.   On: 06/13/2023 02:54    Procedures .Laceration Repair  Date/Time: 06/13/2023 3:58 AM  Performed by: Bari Charmaine FALCON, MD Authorized by: Bari Charmaine FALCON, MD   Consent:    Consent obtained:  Verbal   Consent given by:  Patient   Risks discussed:  Infection, pain, poor cosmetic result and poor wound healing   Alternatives discussed:  No treatment Anesthesia:    Anesthesia method:  Local infiltration   Local anesthetic:  Lidocaine  2% WITH epi Laceration details:    Location:  Shoulder/arm   Shoulder/arm location:  L elbow   Length (cm):  5   Depth (mm):   8 Pre-procedure details:    Preparation:  Patient was prepped and draped in usual sterile fashion Exploration:    Limited defect created (wound extended): no     Imaging outcome: foreign body not noted     Wound exploration: wound explored through full range of motion     Wound extent: areolar tissue not violated, fascia not violated, no foreign body, no signs of injury, no nerve damage, no tendon damage, no underlying fracture and no vascular damage     Contaminated: no   Treatment:    Area cleansed with:  Saline   Amount of cleaning:  Standard   Irrigation solution:  Sterile saline   Irrigation method:  Pressure wash   Visualized foreign bodies/material removed: no     Debridement:  Minimal   Undermining:  None   Scar revision: no  Skin repair:    Repair method:  Sutures   Suture size:  4-0   Suture material:  Nylon   Suture technique:  Simple interrupted and horizontal mattress   Number of sutures:  3 Approximation:    Approximation:  Close Repair type:    Repair type:  Simple Post-procedure details:    Dressing:  Non-adherent dressing   Procedure completion:  Tolerated     Medications Ordered in ED Medications  bacitracin  500 UNIT/GM ointment (has no administration in time range)  Tdap (BOOSTRIX) injection 0.5 mL (0.5 mLs Intramuscular Given 06/13/23 0227)  lidocaine -EPINEPHrine  (XYLOCAINE  W/EPI) 2 %-1:200000 (PF) injection 20 mL (20 mLs Infiltration Given 06/13/23 0227)  loperamide  (IMODIUM ) capsule 4 mg (4 mg Oral Given 06/13/23 0226)    ED Course/ Medical Decision Making/ A&P                                 Medical Decision Making Amount and/or Complexity of Data Reviewed Radiology: ordered.  Risk Prescription drug management.   This patient presents to the ED for concern of fall, this involves an extensive number of treatment options, and is a complaint that carries with it a high risk of complications and morbidity.  I considered the following differential  and admission for this acute, potentially life threatening condition.  The differential diagnosis includes fracture, laceration, head injury  MDM:    This is a 89 year old female who presents after reported mechanical fall.  She is awake, alert, oriented.  Vital signs are reassuring.  She has a laceration and skin tears to the left elbow.  She is on Eliquis .  She denies hitting her head but would be high risk.  She was given a tetanus.  X-rays of the left elbow did not show any acute fracture.  CT scan of the head does not show any evidence of subdural or other traumatic injury.  Laceration was repaired at the bedside.  Patient and her daughter were given instructions regarding wound care and skin care care.  Suture removal in approximately 10 days.  (Labs, imaging, consults)  Labs: I Ordered, and personally interpreted labs.  The pertinent results include: None  Imaging Studies ordered: I ordered imaging studies including left elbow, CT head I independently visualized and interpreted imaging. I agree with the radiologist interpretation  Additional history obtained from chart review.  External records from outside source obtained and reviewed including prior evaluations  Cardiac Monitoring: The patient was not maintained on a cardiac monitor.  If on the cardiac monitor, I personally viewed and interpreted the cardiac monitored which showed an underlying rhythm of: N/A  Reevaluation: After the interventions noted above, I reevaluated the patient and found that they have :stayed the same  Social Determinants of Health:  lives independently  Disposition: Discharge  Co morbidities that complicate the patient evaluation  Past Medical History:  Diagnosis Date   Adrenal myelolipoma    Apical variant hypertrophic cardiomyopathy (HCC)    Echo 4/18: Normal LV size with marked apical hypertrophy. LVEF 60-65%. No WMA. Mild MR. Moderate left atrial enlargement. Normal RV size and function. Mild  right atrial enlargemen   Chronic kidney disease    DIVERTICULOSIS, COLON    Dysmetabolic syndrome X    FIBROMYALGIA    Gastric ulcer due to Helicobacter pylori    Hiatal hernia    Hx of cataract surgery 01/08/2016   HYPERLIPIDEMIA    Hyperplastic colon polyp  HYPERTENSION, ESSENTIAL NOS    Hyponatremia    Hypothyroidism    MYCOSIS FUNGOIDES    Nodule of left lung    Normocytic anemia    Olecranon fracture 01/07/2016   OSTEOARTHRITIS    OSTEOPOROSIS    Persistent atrial fibrillation (HCC)    s/p DCCV 2018 // Apixaban  for anticoag   Renal angiomyolipoma    Unspecified vitamin D  deficiency      Medicines Meds ordered this encounter  Medications   Tdap (BOOSTRIX) injection 0.5 mL   lidocaine -EPINEPHrine  (XYLOCAINE  W/EPI) 2 %-1:200000 (PF) injection 20 mL   loperamide  (IMODIUM ) capsule 4 mg   bacitracin  500 UNIT/GM ointment    Jann, Gabriela: cabinet override    I have reviewed the patients home medicines and have made adjustments as needed  Problem List / ED Course: Problem List Items Addressed This Visit   None Visit Diagnoses       Fall, initial encounter    -  Primary     Laceration of left elbow, initial encounter         Skin tear of left elbow without complication, initial encounter                       Final Clinical Impression(s) / ED Diagnoses Final diagnoses:  Fall, initial encounter  Laceration of left elbow, initial encounter  Skin tear of left elbow without complication, initial encounter    Rx / DC Orders ED Discharge Orders     None         Bari, Charmaine FALCON, MD 06/13/23 0401

## 2023-06-13 NOTE — Discharge Instructions (Signed)
 You were seen today after a fall.  The laceration to the left elbow was repaired.  You need to have stitches removed in approximately 10 days.  Keep a nonadherent dressing with antibiotic ointment applied.  Make sure that you are taking extra precautions at home to prevent falls.

## 2023-06-13 NOTE — ED Triage Notes (Addendum)
 Patient BIB EMS from home c/o mechanical fall. Patient fell while walking with her walker. Patient hit her elbow to the night stand. Patient sustain laceration on her left elbow. Patient denies LOC. Patient taking blood thinners. Patient a/ox4.

## 2023-06-20 ENCOUNTER — Ambulatory Visit (INDEPENDENT_AMBULATORY_CARE_PROVIDER_SITE_OTHER): Payer: Medicare Other | Admitting: Internal Medicine

## 2023-06-20 ENCOUNTER — Encounter: Payer: Self-pay | Admitting: Internal Medicine

## 2023-06-20 VITALS — BP 122/70 | HR 80 | Temp 97.9°F | Ht <= 58 in | Wt 126.0 lb

## 2023-06-20 DIAGNOSIS — S41112D Laceration without foreign body of left upper arm, subsequent encounter: Secondary | ICD-10-CM

## 2023-06-20 DIAGNOSIS — I4819 Other persistent atrial fibrillation: Secondary | ICD-10-CM

## 2023-06-20 DIAGNOSIS — S41119A Laceration without foreign body of unspecified upper arm, initial encounter: Secondary | ICD-10-CM | POA: Insufficient documentation

## 2023-06-20 MED ORDER — MUPIROCIN 2 % EX OINT: TOPICAL_OINTMENT | CUTANEOUS | 0 refills | Status: DC

## 2023-06-20 NOTE — Assessment & Plan Note (Signed)
See procedure.  3 sutures were removed. Wound instructions right Bactroban ointment prescribed

## 2023-06-20 NOTE — Progress Notes (Signed)
Subjective:  Patient ID: [name], female    DOB: [birth year]  Age: 89 y.o. MRN: [id number]  CC: Suture / Staple Removal   HPI [name] presents for R forearm wound check, suture removal. She is here with her daughter  Outpatient Medications Prior to Visit  Medication Sig Dispense Refill   AREXVY 120 MCG/0.5ML injection      carvedilol (COREG) 6.25 MG tablet TAKE 1 TABLET BY MOUTH TWICE A DAY 180 tablet 3   Cholecalciferol 50 MCG (2000 UT) CAPS Take 1 capsule (2,000 Units total) by mouth daily. 30 capsule    ELIQUIS 5 MG TABS tablet TAKE 1 TABLET BY MOUTH TWICE A DAY 60 tablet 5   ferrous sulfate 324 MG TBEC Take 324 mg by mouth.     levothyroxine (SYNTHROID) 75 MCG tablet TAKE 1 TABLET BY MOUTH DAILY EXCEPT TAKE 1/2 TABLET ON TUESDAYS, THURSDAY AND SATURDAY 66 tablet 1   lisinopril (ZESTRIL) 40 MG tablet TAKE 1 TABLET BY MOUTH EVERY DAY 30 tablet 0   LORazepam (ATIVAN) 0.5 MG tablet TAKE 1/2 TABLET BY MOUTH DAILY AS NEEDED FOR ANXIETY 30 tablet 1   omeprazole (PRILOSEC) 40 MG capsule TAKE 1 CAPSULE BY MOUTH EVERY DAY 90 capsule 3   vitamin B-12 (CYANOCOBALAMIN) 1000 MCG tablet Take 1 tablet (1,000 mcg total) by mouth daily.     No facility-administered medications prior to visit.    ROS: Review of Systems  Constitutional:  Negative for activity change, appetite change, chills, fatigue and unexpected weight change.  HENT:  Negative for congestion, mouth sores and sinus pressure.   Eyes:  Negative for visual disturbance.  Respiratory:  Negative for cough and chest tightness.   Gastrointestinal:  Negative for abdominal pain and nausea.  Genitourinary:  Negative for difficulty urinating, frequency and vaginal pain.  Musculoskeletal:  Negative for back pain and gait problem.  Skin:  Positive for wound. Negative for color change, pallor and rash.  Neurological:  Negative for dizziness, tremors, weakness, numbness and headaches.  Hematological:  Bruises/bleeds easily.   Psychiatric/Behavioral:  Negative for confusion and sleep disturbance.     Objective:  BP 122/70 (BP Location: Right Arm, Patient Position: Sitting, Cuff Size: Normal)   Pulse 80   Temp 97.9 F (36.6 C) (Oral)   Ht 4\' 10"  (1.473 m)   Wt 126 lb (57.2 kg)   LMP  (LMP Unknown) Comment: not sexually  SpO2 98%   BMI 26.33 kg/m   BP Readings from Last 3 Encounters:  06/20/23 122/70  06/13/23 (!) 107/94  05/09/23 138/72    Wt Readings from Last 3 Encounters:  06/20/23 126 lb (57.2 kg)  05/09/23 130 lb (59 kg)  01/21/23 136 lb (61.7 kg)    Physical Exam Constitutional:      Appearance: Normal appearance. She is obese. She is not toxic-appearing.  Musculoskeletal:        General: Tenderness present.  Skin:    Coloration: Skin is not jaundiced.     Findings: Bruising present.  Neurological:     Gait: Gait abnormal.   Left forearm wound is triangular shaped measuring about 3 x 4 x 2 cm, clean base.  Deeply embedded sutures were identified x 3. Skin was cleaned.  Sutures were removed with difficulty under magnification with the help of forceps and a straight blade scalpel.  The wound was covered with antibiotic ointment/Telfa pad/gauze/Coban wrap.  Tolerated well.  Complications none.    A total time of 30 minutes was  spent preparing to see the patient, performing a complicated suture removal procedure, applying the dressing, getting instructions.  Lab Results  Component Value Date   WBC 7.1 01/21/2023   HGB 10.8 (L) 01/21/2023   HCT 33.5 (L) 01/21/2023   PLT 290.0 01/21/2023   GLUCOSE 97 01/21/2023   CHOL 188 01/21/2023   TRIG 164.0 (H) 01/21/2023   HDL 46.10 01/21/2023   LDLDIRECT 128.9 09/09/2012   LDLCALC 109 (H) 01/21/2023   ALT 5 01/21/2023   AST 11 01/21/2023   NA 137 01/21/2023   K 4.2 01/21/2023   CL 106 01/21/2023   CREATININE 1.07 01/21/2023   BUN 20 01/21/2023   CO2 21 01/21/2023   TSH 0.21 (L) 01/21/2023   INR 1.1 12/31/2016   HGBA1C 5.2  12/29/2019   MICROALBUR 0.2 04/24/2006    CT Head Wo Contrast Result Date: 06/13/2023 CLINICAL DATA:  Head trauma, minor (Age >= 65y).  Fall. EXAM: CT HEAD WITHOUT CONTRAST TECHNIQUE: Contiguous axial images were obtained from the base of the skull through the vertex without intravenous contrast. RADIATION DOSE REDUCTION: This exam was performed according to the departmental dose-optimization program which includes automated exposure control, adjustment of the mA and/or kV according to patient size and/or use of iterative reconstruction technique. COMPARISON:  12/03/2019 FINDINGS: Brain: There is atrophy and chronic small vessel disease changes. No acute intracranial abnormality. Specifically, no hemorrhage, hydrocephalus, mass lesion, acute infarction, or significant intracranial injury. Vascular: No hyperdense vessel or unexpected calcification. Skull: No acute calvarial abnormality. Sinuses/Orbits: No acute findings Other: None IMPRESSION: Atrophy, chronic microvascular disease. No acute intracranial abnormality. Electronically Signed   By: Charlett Nose M.D.   On: 06/13/2023 03:03   DG Elbow Complete Left Result Date: 06/13/2023 CLINICAL DATA:  Larey Seat at home this morning and hit elbow on floor, laceration to the olecranon area. Pain and bruising appears limited movement EXAM: LEFT ELBOW - COMPLETE 3+ VIEW COMPARISON:  12/03/2019 FINDINGS: No acute fracture or dislocation. Laceration about the proximal forearm. Soft tissue swelling about the elbow. IMPRESSION: No acute fracture or dislocation. Electronically Signed   By: Minerva Fester M.D.   On: 06/13/2023 02:54    Assessment & Plan:   Problem List Items Addressed This Visit     Persistent atrial fibrillation (HCC)   On Eliquis.  No excessive bleeding from the one today.      Laceration of arm - Primary   See procedure.  3 sutures were removed. Wound instructions right Bactroban ointment prescribed         Meds ordered this encounter   Medications   mupirocin ointment (BACTROBAN) 2 %    Sig: On leg wound w/dressing change qd or bid    Dispense:  30 g    Refill:  0      Follow-up: Return for f/u with PCP.  Sonda Primes, MD

## 2023-06-20 NOTE — Assessment & Plan Note (Signed)
On Eliquis.  No excessive bleeding from the one today.

## 2023-07-03 ENCOUNTER — Other Ambulatory Visit: Payer: Self-pay | Admitting: Interventional Cardiology

## 2023-07-04 ENCOUNTER — Other Ambulatory Visit: Payer: Self-pay | Admitting: Internal Medicine

## 2023-07-14 ENCOUNTER — Other Ambulatory Visit: Payer: Self-pay | Admitting: Internal Medicine

## 2023-07-24 ENCOUNTER — Ambulatory Visit: Payer: Medicare Other | Admitting: Internal Medicine

## 2023-08-06 ENCOUNTER — Ambulatory Visit: Payer: Medicare Other | Admitting: Radiology

## 2023-08-15 DIAGNOSIS — H26493 Other secondary cataract, bilateral: Secondary | ICD-10-CM | POA: Diagnosis not present

## 2023-08-15 DIAGNOSIS — H35363 Drusen (degenerative) of macula, bilateral: Secondary | ICD-10-CM | POA: Diagnosis not present

## 2023-08-15 DIAGNOSIS — H04123 Dry eye syndrome of bilateral lacrimal glands: Secondary | ICD-10-CM | POA: Diagnosis not present

## 2023-08-15 DIAGNOSIS — H353132 Nonexudative age-related macular degeneration, bilateral, intermediate dry stage: Secondary | ICD-10-CM | POA: Diagnosis not present

## 2023-08-21 ENCOUNTER — Encounter: Payer: Self-pay | Admitting: Radiology

## 2023-08-21 ENCOUNTER — Ambulatory Visit (INDEPENDENT_AMBULATORY_CARE_PROVIDER_SITE_OTHER): Admitting: Radiology

## 2023-08-21 VITALS — BP 112/70

## 2023-08-21 DIAGNOSIS — N898 Other specified noninflammatory disorders of vagina: Secondary | ICD-10-CM | POA: Diagnosis not present

## 2023-08-21 DIAGNOSIS — Z466 Encounter for fitting and adjustment of urinary device: Secondary | ICD-10-CM

## 2023-08-21 LAB — WET PREP FOR TRICH, YEAST, CLUE

## 2023-08-21 NOTE — Progress Notes (Signed)
   Subjective:     History of Present Illness: [name] is a 89 y.o. female with stage III pelvic organ prolapse who presents for a pessary check. She is using a size 4 ring with support pessary. The pessary has been working well she does complain of vaginal odor. She is not using vaginal estrogen. She denies vaginal bleeding.  Past Medical History: Patient  has a past medical history of Adrenal myelolipoma, Apical variant hypertrophic cardiomyopathy (HCC), Chronic kidney disease, DIVERTICULOSIS, COLON, Dysmetabolic syndrome X, FIBROMYALGIA, Gastric ulcer due to Helicobacter pylori, Hiatal hernia, cataract surgery (01/08/2016), HYPERLIPIDEMIA, Hyperplastic colon polyp, HYPERTENSION, ESSENTIAL NOS, Hyponatremia, Hypothyroidism, MYCOSIS FUNGOIDES, Nodule of left lung, Normocytic anemia, Olecranon fracture (01/07/2016), OSTEOARTHRITIS, OSTEOPOROSIS, Persistent atrial fibrillation (HCC), Renal angiomyolipoma, and Unspecified vitamin D deficiency.   Past Surgical History: She  has a past surgical history that includes G 4 P 2; colonoscopy with polypectomy (07/25/2007); ORIF elbow fracture (Right, 01/08/2016); I & D extremity (Right, 01/08/2016); and Cardioversion (N/A, 01/03/2017).   Medications: She has a current medication list which includes the following prescription(s): arexvy, carvedilol, cholecalciferol, eliquis, ferrous sulfate, levothyroxine, lisinopril, lorazepam, mupirocin ointment, omeprazole, and cyanocobalamin.   Allergies: Patient is allergic to achromycin [tetracycline hcl], celecoxib, clarithromycin, ezetimibe-simvastatin, flagyl [metronidazole], penicillins, simvastatin, clonazepam, erythromycin, sertraline, tramadol, and abilify [aripiprazole].       Objective:    Physical Exam: BP 112/70 (BP Location: Right Arm, Patient Position: Sitting, Cuff Size: Normal)   LMP  (LMP Unknown) Comment: not sexually Gen: No apparent distress, A&O x 3. Detailed Urogynecologic Evaluation:   Pelvic Exam: Normal external female genitalia; Bartholin's and Skene's glands normal in appearance; urethral meatus normal in appearance, no urethral masses + discharge. The pessary was noted to be in place. It was removed and cleaned. Speculum exam revealed no lesions in the vagina. The pessary was replaced. It was comfortable to the patient and fit well.   Microscopic wet-mount exam shows negative for pathogens, normal epithelial cells .  Assessment/Plan:    1. Pessary maintenance  She will keep the pessary in place until next visit.  She will follow-up in 3 months for a pessary check or sooner as needed.  All questions were answered.  Jarek Longton B, NP 2:20 PM

## 2023-09-02 ENCOUNTER — Ambulatory Visit: Payer: Self-pay | Admitting: *Deleted

## 2023-09-02 NOTE — Telephone Encounter (Signed)
  Chief Complaint: SOB with exertion- seems worse Symptoms: SOB with activity, resting more- but patient states due to nausea Frequency: comes and goes- not constant Pertinent Negatives: Patient denies chest pain Disposition: [] ED /[] Urgent Care (no appt availability in office) / [x] Appointment(In office/virtual)/ []  [facility]/ [] Home Care/ [] Refused Recommended Disposition /[] [facility]/ []  Follow-up with PCP Additional Notes: Patient's daughter,Reinhart Saulters is requesting sooner appointment for patient- SOB seems worse- she can not come tomorrow- will bring her Wednesday  Copied from CRM #[id number]. Topic: Clinical - Red Word Triage >> Sep 02, 2023  3:44 PM Denese Killings wrote: Red Word that prompted transfer to Nurse Triage: Patient daughter states that mom has more shortness of breath when moving around. Reason for Disposition  [1] Longstanding difficulty breathing (e.g., CHF, COPD, emphysema) AND [2] WORSE than normal    SOB with exertion seems worse- daughter is requesting a sooner appointment- can not come tomorrow.  Answer Assessment - Initial Assessment Questions 1. RESPIRATORY STATUS: "Describe your breathing?" (e.g., wheezing, shortness of breath, unable to speak, severe coughing)      SOB- with exertion 2. ONSET: "When did this breathing problem begin?"      Patient had fall January- she has had less activity, some weight loss- nausea- not new 3. PATTERN "Does the difficult breathing come and go, or has it been constant since it started?"      Comes and goes 4. SEVERITY: "How bad is your breathing?" (e.g., mild, moderate, severe)    - MILD: No SOB at rest, mild SOB with walking, speaks normally in sentences, can lie down, no retractions, pulse < 100.    - MODERATE: SOB at rest, SOB with minimal exertion and prefers to sit, cannot lie down flat, speaks in phrases, mild retractions, audible wheezing, pulse 100-120.    - SEVERE: Very SOB at rest, speaks in single words,  struggling to breathe, sitting hunched forward, retractions, pulse > 120      Mild- with activity 5. RECURRENT SYMPTOM: "Have you had difficulty breathing before?" If Yes, ask: "When was the last time?" and "What happened that time?"      Seems worse 6. CARDIAC HISTORY: "Do you have any history of heart disease?" (e.g., heart attack, angina, bypass surgery, angioplasty)      yes  8. CAUSE: "What do you think is causing the breathing problem?"      inactivity 9. OTHER SYMPTOMS: "Do you have any other symptoms? (e.g., dizziness, runny nose, cough, chest pain, fever)     no 10. O2 SATURATION MONITOR:  "Do you use an oxygen saturation monitor (pulse oximeter) at home?" If Yes, ask: "What is your reading (oxygen level) today?" "What is your usual oxygen saturation reading?" (e.g., 95%)       Normal 98% P 78  Protocols used: Breathing Difficulty-A-AH

## 2023-09-02 NOTE — Telephone Encounter (Signed)
 Called and spoke with daughter.  Appointment was offered for tomorrow at 3:40 pm but daughter unable to bring her at that current time. So they opted to keep appointment for Wednesday.

## 2023-09-03 ENCOUNTER — Ambulatory Visit: Admitting: Internal Medicine

## 2023-09-03 NOTE — Progress Notes (Unsigned)
    Subjective:    Patient ID: [name], female    DOB: [birth year], 89 y.o.   MRN: [id number]      HPI [name] is here for No chief complaint on file.   SOB with exertion - has gotten worse.   Has chronic SOB with activity that improved with rest.  Has not been on lasix  x 1 year due to side effects.   2018 - Echo - EF 60-65%,  LV dysfunction, mild MR, mod TR  Medications and allergies reviewed with patient and updated if appropriate.  Current Outpatient Medications on File Prior to Visit  Medication Sig Dispense Refill   AREXVY 120 MCG/0.5ML injection      carvedilol (COREG) 6.25 MG tablet TAKE 1 TABLET BY MOUTH TWICE A DAY 180 tablet 3   Cholecalciferol 50 MCG (2000 UT) CAPS Take 1 capsule (2,000 Units total) by mouth daily. 30 capsule    ELIQUIS 5 MG TABS tablet TAKE 1 TABLET BY MOUTH TWICE A DAY 60 tablet 5   ferrous sulfate 324 MG TBEC Take 324 mg by mouth.     levothyroxine (SYNTHROID) 75 MCG tablet TAKE 1 TABLET BY MOUTH DAILY EXCEPT TAKE 1/2 TABLET ON TUESDAYS AND SATURDAY 72 tablet 1   lisinopril (ZESTRIL) 40 MG tablet TAKE 1 TABLET BY MOUTH EVERY DAY 90 tablet 3   LORazepam (ATIVAN) 0.5 MG tablet TAKE 1/2 TABLET BY MOUTH DAILY AS NEEDED FOR ANXIETY 30 tablet 1   mupirocin ointment (BACTROBAN) 2 % On leg wound w/dressing change qd or bid 30 g 0   omeprazole (PRILOSEC) 40 MG capsule TAKE 1 CAPSULE BY MOUTH EVERY DAY 90 capsule 3   vitamin B-12 (CYANOCOBALAMIN) 1000 MCG tablet Take 1 tablet (1,000 mcg total) by mouth daily.     No current facility-administered medications on file prior to visit.    Review of Systems     Objective:  There were no vitals filed for this visit. BP Readings from Last 3 Encounters:  08/21/23 112/70  06/20/23 122/70  06/13/23 (!) 107/94   Wt Readings from Last 3 Encounters:  06/20/23 126 lb (57.2 kg)  05/09/23 130 lb (59 kg)  01/21/23 136 lb (61.7 kg)   There is no height or weight on file to calculate BMI.    Physical  Exam         Assessment & Plan:    See Problem List for Assessment and Plan of chronic medical problems.

## 2023-09-03 NOTE — Patient Instructions (Incomplete)
      Blood work was ordered.   Chest xray ordered    Medications changes include :   zofran every 8 hours as needed for nausea

## 2023-09-04 ENCOUNTER — Ambulatory Visit (INDEPENDENT_AMBULATORY_CARE_PROVIDER_SITE_OTHER)

## 2023-09-04 ENCOUNTER — Encounter: Payer: Self-pay | Admitting: Internal Medicine

## 2023-09-04 ENCOUNTER — Ambulatory Visit (INDEPENDENT_AMBULATORY_CARE_PROVIDER_SITE_OTHER): Admitting: Internal Medicine

## 2023-09-04 VITALS — BP 116/74 | HR 78 | Temp 98.0°F | Ht <= 58 in | Wt 122.0 lb

## 2023-09-04 DIAGNOSIS — F419 Anxiety disorder, unspecified: Secondary | ICD-10-CM | POA: Diagnosis not present

## 2023-09-04 DIAGNOSIS — I10 Essential (primary) hypertension: Secondary | ICD-10-CM

## 2023-09-04 DIAGNOSIS — E038 Other specified hypothyroidism: Secondary | ICD-10-CM | POA: Diagnosis not present

## 2023-09-04 DIAGNOSIS — R110 Nausea: Secondary | ICD-10-CM | POA: Diagnosis not present

## 2023-09-04 DIAGNOSIS — I517 Cardiomegaly: Secondary | ICD-10-CM | POA: Diagnosis not present

## 2023-09-04 DIAGNOSIS — R0602 Shortness of breath: Secondary | ICD-10-CM

## 2023-09-04 DIAGNOSIS — J90 Pleural effusion, not elsewhere classified: Secondary | ICD-10-CM | POA: Diagnosis not present

## 2023-09-04 DIAGNOSIS — R0609 Other forms of dyspnea: Secondary | ICD-10-CM | POA: Diagnosis not present

## 2023-09-04 MED ORDER — ONDANSETRON 4 MG PO TBDP: 4.0000 mg | ORAL_TABLET | Freq: Three times a day (TID) | ORAL | 1 refills | Status: DC | PRN

## 2023-09-04 NOTE — Assessment & Plan Note (Signed)
 Chronic Has worsened With exertion Has lost some weight, has nausea - not eating much CXR Cbc, cmp, bnp, tsh Treatment based on above results

## 2023-09-04 NOTE — Assessment & Plan Note (Signed)
 Chronic - worsened Taking any otc medication Not eating much due to nausea Start zofran 4 mg TID prn

## 2023-09-04 NOTE — Assessment & Plan Note (Signed)
Chronic Blood pressure well controlled CMP Continue Coreg 6.25 mg twice daily, lisinopril 40 mg daily

## 2023-09-04 NOTE — Assessment & Plan Note (Signed)
Chronic Overall controlled Continue lorazepam 0.25 mg daily as needed-she does not take this on a daily basis

## 2023-09-04 NOTE — Assessment & Plan Note (Signed)
Chronic  Clinically euthyroid Check tsh and will titrate med dose if needed Currently taking levothyroxine 75 mcg 5 days a week, 37.5 mcg 2 days a week

## 2023-09-05 ENCOUNTER — Other Ambulatory Visit: Payer: Self-pay | Admitting: Internal Medicine

## 2023-09-05 LAB — COMPREHENSIVE METABOLIC PANEL WITH GFR
ALT: 5 U/L (ref 0–35)
AST: 12 U/L (ref 0–37)
Albumin: 3.9 g/dL (ref 3.5–5.2)
Alkaline Phosphatase: 57 U/L (ref 39–117)
BUN: 22 mg/dL (ref 6–23)
CO2: 24 meq/L (ref 19–32)
Calcium: 9.2 mg/dL (ref 8.4–10.5)
Chloride: 107 meq/L (ref 96–112)
Creatinine, Ser: 1.15 mg/dL (ref 0.40–1.20)
GFR: 41.04 mL/min — ABNORMAL LOW (ref >60.00)
Glucose, Bld: 119 mg/dL — ABNORMAL HIGH (ref 70–99)
Potassium: 4.7 meq/L (ref 3.5–5.1)
Sodium: 138 meq/L (ref 135–145)
Total Bilirubin: 0.5 mg/dL (ref 0.2–1.2)
Total Protein: 6.6 g/dL (ref 6.0–8.3)

## 2023-09-05 LAB — CBC WITH DIFFERENTIAL/PLATELET
Basophils Absolute: 0.1 10*3/uL (ref 0.0–0.1)
Basophils Relative: 1 % (ref 0.0–3.0)
Eosinophils Absolute: 0.1 10*3/uL (ref 0.0–0.7)
Eosinophils Relative: 0.7 % (ref 0.0–5.0)
HCT: 29.2 % — ABNORMAL LOW (ref 36.0–46.0)
Hemoglobin: 9.3 g/dL — ABNORMAL LOW (ref 12.0–15.0)
Lymphocytes Relative: 20.4 % (ref 12.0–46.0)
Lymphs Abs: 1.5 10*3/uL (ref 0.7–4.0)
MCHC: 31.9 g/dL (ref 30.0–36.0)
MCV: 83.7 fl (ref 78.0–100.0)
Monocytes Absolute: 0.6 10*3/uL (ref 0.1–1.0)
Monocytes Relative: 9.1 % (ref 3.0–12.0)
Neutro Abs: 4.9 10*3/uL (ref 1.4–7.7)
Neutrophils Relative %: 68.8 % (ref 43.0–77.0)
Platelets: 316 10*3/uL (ref 150.0–400.0)
RBC: 3.49 Mil/uL — ABNORMAL LOW (ref 3.87–5.11)
RDW: 16.1 % — ABNORMAL HIGH (ref 11.5–15.5)
WBC: 7.1 10*3/uL (ref 4.0–10.5)

## 2023-09-05 LAB — TSH: TSH: 0.16 u[IU]/mL — ABNORMAL LOW (ref 0.35–5.50)

## 2023-09-06 ENCOUNTER — Other Ambulatory Visit (INDEPENDENT_AMBULATORY_CARE_PROVIDER_SITE_OTHER)

## 2023-09-06 ENCOUNTER — Other Ambulatory Visit: Payer: Self-pay | Admitting: Internal Medicine

## 2023-09-06 ENCOUNTER — Telehealth: Payer: Self-pay | Admitting: Internal Medicine

## 2023-09-06 DIAGNOSIS — I509 Heart failure, unspecified: Secondary | ICD-10-CM

## 2023-09-06 DIAGNOSIS — R0602 Shortness of breath: Secondary | ICD-10-CM

## 2023-09-06 DIAGNOSIS — D649 Anemia, unspecified: Secondary | ICD-10-CM

## 2023-09-06 LAB — BRAIN NATRIURETIC PEPTIDE: Pro B Natriuretic peptide (BNP): 1482 pg/mL — ABNORMAL HIGH (ref 0.0–100.0)

## 2023-09-06 MED ORDER — LEVOTHYROXINE SODIUM 75 MCG PO TABS: ORAL_TABLET | ORAL | Status: DC

## 2023-09-06 MED ORDER — FUROSEMIDE 20 MG PO TABS: 20.0000 mg | ORAL_TABLET | Freq: Every day | ORAL | 0 refills | Status: DC | PRN

## 2023-09-06 NOTE — Telephone Encounter (Signed)
 Spoke with [name] today and info given.

## 2023-09-06 NOTE — Telephone Encounter (Signed)
 Please call her daughter.  One of her blood test was not able to be run, but I do think she is a little fluid overloaded.  Her chest x-ray shows small bilateral fluid at the base of her lungs.  Blood work shows decreased but stable kidney function.  Slightly worsening of mild anemia and thyroid is overactive.  For the thyroid have her take 1 pill 5 days a week and no pills 2 days a week.  I think we need to put her on a low-dose Lasix for a few days to get rid of some of the extra fluid.  Lasix 20 mg daily x 2 days.  This was sent to the pharmacy.  She should take 2 days worth and then stop.  She should hang on to the medication in case we need to use this as needed.  She had mild anemia that has worsened.  This gives concern for possible bleeding because of the Eliquis.  They would need to hold the Eliquis for now.  Repeat blood work next week-ideally Wednesday or [employer]

## 2023-09-23 ENCOUNTER — Telehealth: Payer: Self-pay | Admitting: Physician Assistant

## 2023-09-23 NOTE — Telephone Encounter (Signed)
 Spoke with daughter per DPR she states patient was seen by PCP office and was informed she had fluid on her lungs. They prescribed lasix  and advised to follow up with cardiology as soon as possible. She is SOB with exertion and it seems to be getting worse. She is not taking lasix  as prescribed. Appointment scheduled with APP

## 2023-09-23 NOTE — Telephone Encounter (Signed)
 Pt c/o Shortness Of Breath: STAT if SOB developed within the last 24 hours or pt is noticeably SOB on the phone  1. Are you currently SOB (can you hear that pt is SOB on the phone)? No   2. How long have you been experiencing SOB? Couple weeks   3. Are you SOB when sitting or when up moving around? Moving around   4. Are you currently experiencing any other symptoms?   No

## 2023-09-24 ENCOUNTER — Ambulatory Visit: Admitting: Internal Medicine

## 2023-09-26 ENCOUNTER — Ambulatory Visit: Payer: Self-pay

## 2023-09-26 ENCOUNTER — Encounter: Payer: Self-pay | Admitting: Internal Medicine

## 2023-09-26 NOTE — Telephone Encounter (Signed)
 Chief Complaint: nausea, general malaise Symptoms: fatigue, nausea, loss of appetite,  Frequency: ongoing for atleast a few weeks, daughter states that pt was in to see pcp in the beginning of the month and it was discussed at that point Pertinent Negatives: Patient denies SOB, CP,  Disposition: [] ED /[] Urgent Care (no appt availability in office) / [x] Appointment(In office/virtual)/ []  [facility]/ [] Home Care/ [] Refused Recommended Disposition /[] [facility]/ []  Follow-up with PCP Additional Notes: Pt daughter calling in stating that she is concerned that the lasix  is making her mom very nauseous. Per daughter pt is not eating like she use to nor is she as active as she use to be. Per daughter pt will still get up and make herself coffee, but returns shortly to the bed. Pt scheduled for tomorrow afternoon, daughter requested afternoon as the pt does not like to wake early.  Copied from CRM [id number]. Topic: Clinical - Medical Advice >> Sep 26, 2023  9:05 AM [place] E wrote: Reason for CRM: Patient's daughter, Jerryl Morin, called in stating that the patient has not been taking her Lasix  as it makes her feel nauseous. Patient cannot get in to see her cardiologist for a few weeks, and daughter questioning if there is an alternative medication the patient can take instead of Lasix . Callback number for Jerryl Morin is [phone] to discuss. Reason for Disposition  [1] Caller has medicine question about med NOT prescribed by PCP AND [2] triager unable to answer question (e.g., compatibility with other med, storage)  Taking a medicine that could cause weakness (e.g., blood pressure medications, diuretics)  Answer Assessment - Initial Assessment Questions 1. NAME of MEDICINE: "What medicine(s) are you calling about?"     lasix  2. QUESTION: "What is your question?" (e.g., double dose of medicine, side effect)     Medication makes her nauseous, is there another medication that would have the  same impact but wouldn't make her nauseous 3. PRESCRIBER: "Who prescribed the medicine?" Reason: if prescribed by specialist, call should be referred to that group.     Daughter is unsure, states she has been on fluid pills for daughters entire life, PCP was seen in the beginning of April. Pt will not go to see cards until May, daughter would like to try something different.  4. SYMPTOMS: "Do you have any symptoms?" If Yes, ask: "What symptoms are you having?"  "How bad are the symptoms (e.g., mild, moderate, severe)     Nausea when she takes the pill  Answer Assessment - Initial Assessment Questions 1. DESCRIPTION: "Describe how you are feeling."     Daughter states that pt is more tired and seems to be in bed more, loss of appetite 2. SEVERITY: "How bad is it?"  "Can you stand and walk?"   - MILD (0-3): Feels weak or tired, but does not interfere with work, school or normal activities.   - MODERATE (4-7): Able to stand and walk; weakness interferes with work, school, or normal activities.   - SEVERE (8-10): Unable to stand or walk; unable to do usual activities.     Mild, pt still makes her own coffee, but then returns to the bedroom 3. ONSET: "When did these symptoms begin?" (e.g., hours, days, weeks, months)     Ongoing issue, seems to be getting worse per daughter, even since last visit with PCP 4. CAUSE: "What do you think is causing the weakness or fatigue?" (e.g., not drinking enough fluids, medical problem, trouble sleeping)  Daughter believes that the pt may be having side effects from the lasix , also wondering if this could be caused by the fluid on her lungs, can't see cards until next month 5. NEW MEDICINES:  "Have you started on any new medicines recently?" (e.g., opioid pain medicines, benzodiazepines, muscle relaxants, antidepressants, antihistamines, neuroleptics, beta blockers)     Not new, but lasix  has always made her feel this way 6. OTHER SYMPTOMS: "Do you have any other  symptoms?" (e.g., chest pain, fever, cough, SOB, vomiting, diarrhea, bleeding, other areas of pain)     Denies SOB, CP,  Protocols used: Medication Question Call-A-AH, Weakness (Generalized) and Fatigue-A-AH

## 2023-09-26 NOTE — Progress Notes (Unsigned)
 Subjective:    Patient ID: [name], female    DOB: 22-Nov-1927, 89 y.o.   MRN: [id number]      HPI Rahel is here for No chief complaint on file.  Cc: nausea, dec appetite, weight loss, SOB,    Lasix  makes her sick.  She gets nauseous and has not been able to take it.   Eating makes her nauseous.  She has not been eating much and has lost weight.    SOB is the same.  It is better when she is in bed.  She spends a lot of time in bed.  Feels weak.    Not doing her typical ADLs due to weakness which is unusual for her.    Medications and allergies reviewed with patient and updated if appropriate.  No current facility-administered medications on file prior to visit.   Current Outpatient Medications on File Prior to Visit  Medication Sig Dispense Refill   AREXVY 120 MCG/0.5ML injection      carvedilol  (COREG ) 6.25 MG tablet TAKE 1 TABLET BY MOUTH TWICE A DAY 180 tablet 3   Cholecalciferol  50 MCG (2000 UT) CAPS Take 1 capsule (2,000 Units total) by mouth daily. 30 capsule    ELIQUIS  5 MG TABS tablet TAKE 1 TABLET BY MOUTH TWICE A DAY 60 tablet 5   ferrous sulfate 324 MG TBEC Take 324 mg by mouth.     furosemide  (LASIX ) 20 MG tablet Take 1 tablet (20 mg total) by mouth daily as needed. 30 tablet 0   levothyroxine  (SYNTHROID ) 75 MCG tablet TAKE 1 TABLET BY MOUTH DAILY 5 DAYS A WEEK ONLY     lisinopril  (ZESTRIL ) 40 MG tablet TAKE 1 TABLET BY MOUTH EVERY DAY 90 tablet 3   LORazepam  (ATIVAN ) 0.5 MG tablet TAKE 1/2 TABLET BY MOUTH DAILY AS NEEDED FOR ANXIETY 30 tablet 1   omeprazole  (PRILOSEC) 40 MG capsule TAKE 1 CAPSULE BY MOUTH EVERY DAY 90 capsule 3   ondansetron  (ZOFRAN -ODT) 4 MG disintegrating tablet Take 1 tablet (4 mg total) by mouth every 8 (eight) hours as needed for nausea or vomiting. 20 tablet 1   vitamin B-12 (CYANOCOBALAMIN ) 1000 MCG tablet Take 1 tablet (1,000 mcg total) by mouth daily.     mupirocin  ointment (BACTROBAN ) 2 % On leg wound w/dressing change  qd or bid (Patient not taking: Reported on 09/27/2023) 30 g 0    Review of Systems  Respiratory:  Positive for shortness of breath. Negative for cough and wheezing.   Cardiovascular:  Negative for chest pain, palpitations and leg swelling.  Gastrointestinal:  Positive for diarrhea and nausea. Negative for abdominal pain and blood in stool (no black stool).       No gerd       Objective:   Vitals:   09/27/23 1612  BP: 114/70  Pulse: 68  Temp: 98 F (36.7 C)  SpO2: 96%   BP Readings from Last 3 Encounters:  09/27/23 138/66  09/27/23 114/70  09/04/23 116/74   Wt Readings from Last 3 Encounters:  09/27/23 117 lb (53.1 kg)  09/04/23 122 lb (55.3 kg)  06/20/23 126 lb (57.2 kg)   Body mass index is 24.45 kg/m.    Physical Exam Constitutional:      General: She is not in acute distress.    Appearance: Normal appearance. She is not toxic-appearing.  HENT:     Head: Normocephalic and atraumatic.  Eyes:     Conjunctiva/sclera: Conjunctivae normal.  Cardiovascular:  Rate and Rhythm: Normal rate and regular rhythm.     Heart sounds: Normal heart sounds.  Pulmonary:     Effort: Pulmonary effort is normal. No respiratory distress.     Breath sounds: Normal breath sounds. No wheezing.  Abdominal:     General: There is no distension.     Palpations: Abdomen is soft.     Tenderness: There is no abdominal tenderness.  Musculoskeletal:     Cervical back: Neck supple.     Right lower leg: No edema.     Left lower leg: No edema.  Lymphadenopathy:     Cervical: No cervical adenopathy.  Skin:    General: Skin is warm and dry.     Findings: No rash.  Neurological:     Mental Status: She is alert. Mental status is at baseline.  Psychiatric:        Mood and Affect: Mood normal.        Behavior: Behavior normal.            Assessment & Plan:    See Problem List for Assessment and Plan of chronic medical problems.    I spent 30 minutes dedicated to the care of  this patient on the date of this encounter including review of most recent labs, imaging (CXR),  obtaining history, communicating with the patient and her daughter, discussing differential diagnoses and importance of going to the ED for further evaluation and documenting clinical information in the EHR

## 2023-09-27 ENCOUNTER — Inpatient Hospital Stay (HOSPITAL_COMMUNITY)
Admission: EM | Admit: 2023-09-27 | Discharge: 2023-10-02 | DRG: 291 | Disposition: A | Discharge status: Home Health | Attending: Family Medicine | Admitting: Family Medicine

## 2023-09-27 ENCOUNTER — Emergency Department (HOSPITAL_COMMUNITY)

## 2023-09-27 ENCOUNTER — Encounter (HOSPITAL_COMMUNITY): Payer: Self-pay

## 2023-09-27 ENCOUNTER — Ambulatory Visit (INDEPENDENT_AMBULATORY_CARE_PROVIDER_SITE_OTHER): Admitting: Internal Medicine

## 2023-09-27 ENCOUNTER — Other Ambulatory Visit: Payer: Self-pay

## 2023-09-27 VITALS — BP 114/70 | HR 68 | Temp 98.0°F | Ht <= 58 in | Wt 117.0 lb

## 2023-09-27 DIAGNOSIS — Z79899 Other long term (current) drug therapy: Secondary | ICD-10-CM | POA: Diagnosis not present

## 2023-09-27 DIAGNOSIS — E039 Hypothyroidism, unspecified: Secondary | ICD-10-CM | POA: Diagnosis present

## 2023-09-27 DIAGNOSIS — R0602 Shortness of breath: Secondary | ICD-10-CM | POA: Diagnosis not present

## 2023-09-27 DIAGNOSIS — Z8249 Family history of ischemic heart disease and other diseases of the circulatory system: Secondary | ICD-10-CM

## 2023-09-27 DIAGNOSIS — E0580 Other thyrotoxicosis without thyrotoxic crisis or storm: Secondary | ICD-10-CM

## 2023-09-27 DIAGNOSIS — N1831 Chronic kidney disease, stage 3a: Secondary | ICD-10-CM | POA: Diagnosis present

## 2023-09-27 DIAGNOSIS — I5021 Acute systolic (congestive) heart failure: Secondary | ICD-10-CM | POA: Diagnosis not present

## 2023-09-27 DIAGNOSIS — R0600 Dyspnea, unspecified: Secondary | ICD-10-CM | POA: Diagnosis not present

## 2023-09-27 DIAGNOSIS — K5900 Constipation, unspecified: Secondary | ICD-10-CM | POA: Diagnosis not present

## 2023-09-27 DIAGNOSIS — R600 Localized edema: Secondary | ICD-10-CM

## 2023-09-27 DIAGNOSIS — K449 Diaphragmatic hernia without obstruction or gangrene: Secondary | ICD-10-CM | POA: Diagnosis present

## 2023-09-27 DIAGNOSIS — Z880 Allergy status to penicillin: Secondary | ICD-10-CM

## 2023-09-27 DIAGNOSIS — Z91128 Patient's intentional underdosing of medication regimen for other reason: Secondary | ICD-10-CM

## 2023-09-27 DIAGNOSIS — Z79890 Hormone replacement therapy (postmenopausal): Secondary | ICD-10-CM

## 2023-09-27 DIAGNOSIS — D509 Iron deficiency anemia, unspecified: Secondary | ICD-10-CM | POA: Diagnosis present

## 2023-09-27 DIAGNOSIS — R630 Anorexia: Secondary | ICD-10-CM | POA: Diagnosis present

## 2023-09-27 DIAGNOSIS — E8881 Metabolic syndrome: Secondary | ICD-10-CM | POA: Diagnosis present

## 2023-09-27 DIAGNOSIS — I4811 Longstanding persistent atrial fibrillation: Secondary | ICD-10-CM

## 2023-09-27 DIAGNOSIS — Z66 Do not resuscitate: Secondary | ICD-10-CM | POA: Diagnosis present

## 2023-09-27 DIAGNOSIS — Z7901 Long term (current) use of anticoagulants: Secondary | ICD-10-CM | POA: Diagnosis not present

## 2023-09-27 DIAGNOSIS — E782 Mixed hyperlipidemia: Secondary | ICD-10-CM | POA: Diagnosis present

## 2023-09-27 DIAGNOSIS — I5023 Acute on chronic systolic (congestive) heart failure: Secondary | ICD-10-CM | POA: Diagnosis present

## 2023-09-27 DIAGNOSIS — I422 Other hypertrophic cardiomyopathy: Secondary | ICD-10-CM | POA: Diagnosis present

## 2023-09-27 DIAGNOSIS — N179 Acute kidney failure, unspecified: Secondary | ICD-10-CM | POA: Diagnosis not present

## 2023-09-27 DIAGNOSIS — J9811 Atelectasis: Secondary | ICD-10-CM | POA: Diagnosis not present

## 2023-09-27 DIAGNOSIS — I4891 Unspecified atrial fibrillation: Secondary | ICD-10-CM | POA: Diagnosis not present

## 2023-09-27 DIAGNOSIS — D1771 Benign lipomatous neoplasm of kidney: Secondary | ICD-10-CM | POA: Diagnosis present

## 2023-09-27 DIAGNOSIS — I081 Rheumatic disorders of both mitral and tricuspid valves: Secondary | ICD-10-CM | POA: Diagnosis present

## 2023-09-27 DIAGNOSIS — J90 Pleural effusion, not elsewhere classified: Secondary | ICD-10-CM | POA: Diagnosis not present

## 2023-09-27 DIAGNOSIS — Z823 Family history of stroke: Secondary | ICD-10-CM

## 2023-09-27 DIAGNOSIS — R0902 Hypoxemia: Secondary | ICD-10-CM | POA: Diagnosis not present

## 2023-09-27 DIAGNOSIS — R110 Nausea: Secondary | ICD-10-CM | POA: Diagnosis not present

## 2023-09-27 DIAGNOSIS — Z87892 Personal history of anaphylaxis: Secondary | ICD-10-CM

## 2023-09-27 DIAGNOSIS — I509 Heart failure, unspecified: Secondary | ICD-10-CM | POA: Diagnosis not present

## 2023-09-27 DIAGNOSIS — I110 Hypertensive heart disease with heart failure: Secondary | ICD-10-CM | POA: Diagnosis not present

## 2023-09-27 DIAGNOSIS — I4819 Other persistent atrial fibrillation: Secondary | ICD-10-CM | POA: Diagnosis not present

## 2023-09-27 DIAGNOSIS — T501X6A Underdosing of loop [high-ceiling] diuretics, initial encounter: Secondary | ICD-10-CM | POA: Diagnosis present

## 2023-09-27 DIAGNOSIS — I10 Essential (primary) hypertension: Secondary | ICD-10-CM | POA: Diagnosis present

## 2023-09-27 DIAGNOSIS — I130 Hypertensive heart and chronic kidney disease with heart failure and stage 1 through stage 4 chronic kidney disease, or unspecified chronic kidney disease: Principal | ICD-10-CM | POA: Diagnosis present

## 2023-09-27 DIAGNOSIS — K5730 Diverticulosis of large intestine without perforation or abscess without bleeding: Secondary | ICD-10-CM | POA: Diagnosis not present

## 2023-09-27 DIAGNOSIS — Z9181 History of falling: Secondary | ICD-10-CM

## 2023-09-27 DIAGNOSIS — E785 Hyperlipidemia, unspecified: Secondary | ICD-10-CM | POA: Diagnosis present

## 2023-09-27 DIAGNOSIS — R634 Abnormal weight loss: Secondary | ICD-10-CM | POA: Diagnosis not present

## 2023-09-27 DIAGNOSIS — M797 Fibromyalgia: Secondary | ICD-10-CM | POA: Diagnosis present

## 2023-09-27 DIAGNOSIS — R7989 Other specified abnormal findings of blood chemistry: Secondary | ICD-10-CM | POA: Diagnosis not present

## 2023-09-27 DIAGNOSIS — Z87891 Personal history of nicotine dependence: Secondary | ICD-10-CM | POA: Diagnosis not present

## 2023-09-27 DIAGNOSIS — I5022 Chronic systolic (congestive) heart failure: Secondary | ICD-10-CM

## 2023-09-27 DIAGNOSIS — D649 Anemia, unspecified: Secondary | ICD-10-CM | POA: Diagnosis present

## 2023-09-27 DIAGNOSIS — R918 Other nonspecific abnormal finding of lung field: Secondary | ICD-10-CM | POA: Diagnosis not present

## 2023-09-27 DIAGNOSIS — D3500 Benign neoplasm of unspecified adrenal gland: Secondary | ICD-10-CM | POA: Diagnosis present

## 2023-09-27 DIAGNOSIS — I5031 Acute diastolic (congestive) heart failure: Secondary | ICD-10-CM | POA: Diagnosis not present

## 2023-09-27 DIAGNOSIS — K219 Gastro-esophageal reflux disease without esophagitis: Secondary | ICD-10-CM | POA: Diagnosis present

## 2023-09-27 DIAGNOSIS — Z6824 Body mass index (BMI) 24.0-24.9, adult: Secondary | ICD-10-CM

## 2023-09-27 DIAGNOSIS — Z8711 Personal history of peptic ulcer disease: Secondary | ICD-10-CM

## 2023-09-27 DIAGNOSIS — F419 Anxiety disorder, unspecified: Secondary | ICD-10-CM | POA: Diagnosis present

## 2023-09-27 LAB — CBC WITH DIFFERENTIAL/PLATELET
Abs Immature Granulocytes: 0.01 10*3/uL (ref 0.00–0.07)
Basophils Absolute: 0 10*3/uL (ref 0.0–0.1)
Basophils Relative: 1 %
Eosinophils Absolute: 0.1 10*3/uL (ref 0.0–0.5)
Eosinophils Relative: 1 %
HCT: 31.6 % — ABNORMAL LOW (ref 36.0–46.0)
Hemoglobin: 8.7 g/dL — ABNORMAL LOW (ref 12.0–15.0)
Immature Granulocytes: 0 %
Lymphocytes Relative: 28 %
Lymphs Abs: 1.9 10*3/uL (ref 0.7–4.0)
MCH: 24.3 pg — ABNORMAL LOW (ref 26.0–34.0)
MCHC: 27.5 g/dL — ABNORMAL LOW (ref 30.0–36.0)
MCV: 88.3 fL (ref 80.0–100.0)
Monocytes Absolute: 0.5 10*3/uL (ref 0.1–1.0)
Monocytes Relative: 8 %
Neutro Abs: 4.1 10*3/uL (ref 1.7–7.7)
Neutrophils Relative %: 62 %
Platelets: 327 10*3/uL (ref 150–400)
RBC: 3.58 MIL/uL — ABNORMAL LOW (ref 3.87–5.11)
RDW: 15.1 % (ref 11.5–15.5)
WBC: 6.6 10*3/uL (ref 4.0–10.5)
nRBC: 0 % (ref 0.0–0.2)

## 2023-09-27 LAB — COMPREHENSIVE METABOLIC PANEL WITH GFR
ALT: 8 U/L (ref 0–44)
AST: 14 U/L — ABNORMAL LOW (ref 15–41)
Albumin: 3.6 g/dL (ref 3.5–5.0)
Alkaline Phosphatase: 53 U/L (ref 38–126)
Anion gap: 9 (ref 5–15)
BUN: 22 mg/dL (ref 8–23)
CO2: 22 mmol/L (ref 22–32)
Calcium: 9.1 mg/dL (ref 8.9–10.3)
Chloride: 104 mmol/L (ref 98–111)
Creatinine, Ser: 1.09 mg/dL — ABNORMAL HIGH (ref 0.44–1.00)
GFR, Estimated: 47 mL/min — ABNORMAL LOW (ref >60)
Glucose, Bld: 110 mg/dL — ABNORMAL HIGH (ref 70–99)
Potassium: 3.8 mmol/L (ref 3.5–5.1)
Sodium: 135 mmol/L (ref 135–145)
Total Bilirubin: 0.6 mg/dL (ref 0.0–1.2)
Total Protein: 6.6 g/dL (ref 6.5–8.1)

## 2023-09-27 LAB — TYPE AND SCREEN
ABO/RH(D): AB POS
Antibody Screen: NEGATIVE

## 2023-09-27 MED ORDER — FUROSEMIDE 10 MG/ML IJ SOLN
40.0000 mg | Freq: Once | INTRAMUSCULAR | Status: AC
  Administered 2023-09-27: 40 mg via INTRAVENOUS
  Filled 2023-09-27: qty 4

## 2023-09-27 MED ORDER — ONDANSETRON HCL 4 MG/2ML IJ SOLN
4.0000 mg | Freq: Once | INTRAMUSCULAR | Status: AC
  Administered 2023-09-27: 4 mg via INTRAVENOUS
  Filled 2023-09-27: qty 2

## 2023-09-27 NOTE — Assessment & Plan Note (Signed)
 Acute Lasix  makes her nauseous Eating makes her nauseous Decreased appetite Weight loss Taking zofran   Concerned about UGIB - gastritis - on eliquis  and has decrease h/h Advised to go to ED for evaluation

## 2023-09-27 NOTE — Patient Instructions (Addendum)
      Worsening anemia - concern for UGIB on eliquis .   Worsening SOB, nausea, worsening generalized weakness    HF exacerbation -- Last BNP elevated - not tolerating lasix

## 2023-09-27 NOTE — ED Provider Notes (Signed)
 [place] EMERGENCY DEPARTMENT AT [facility] Provider Note   CSN: [id number] Arrival date & time: 09/27/23  1824     History Chief Complaint  Patient presents with   Shortness of Breath   abnormal labs    HPI [name] is a 89 y.o. female presenting for chief complaint of SOB. Much more fatigued than her baseline and not performing her ADLs.  Family states her home O2 has been ok at rest.  Patient's recorded medical, surgical, social, medication list and allergies were reviewed in the Snapshot window as part of the initial history.   Review of Systems   Review of Systems  Constitutional:  Positive for fatigue. Negative for chills and fever.  HENT:  Negative for ear pain and sore throat.   Eyes:  Negative for pain and visual disturbance.  Respiratory:  Positive for cough and shortness of breath.   Cardiovascular:  Positive for leg swelling. Negative for chest pain and palpitations.  Gastrointestinal:  Negative for abdominal pain and vomiting.  Genitourinary:  Negative for dysuria and hematuria.  Musculoskeletal:  Negative for arthralgias and back pain.  Skin:  Negative for color change and rash.  Neurological:  Negative for seizures and syncope.  All other systems reviewed and are negative.   Physical Exam Updated Vital Signs BP 126/67   Pulse 79   Temp (!) 97.3 F (36.3 C) (Oral)   Resp 17   Ht 4\' 10"  (1.473 m)   Wt 53.1 kg   LMP  (LMP Unknown) Comment: not sexually  SpO2 98%   BMI 24.45 kg/m  Physical Exam Vitals and nursing note reviewed.  Constitutional:      General: She is not in acute distress.    Appearance: She is well-developed.  HENT:     Head: Normocephalic and atraumatic.  Eyes:     Conjunctiva/sclera: Conjunctivae normal.  Cardiovascular:     Rate and Rhythm: Normal rate and regular rhythm.     Heart sounds: No murmur heard. Pulmonary:     Effort: Pulmonary effort is normal. No respiratory distress.     Breath sounds:  Rales present.  Abdominal:     General: There is no distension.     Palpations: Abdomen is soft.     Tenderness: There is no abdominal tenderness. There is no right CVA tenderness or left CVA tenderness.  Musculoskeletal:        General: No swelling or tenderness. Normal range of motion.     Cervical back: Neck supple.  Skin:    General: Skin is warm and dry.  Neurological:     General: No focal deficit present.     Mental Status: She is alert and oriented to person, place, and time. Mental status is at baseline.     Cranial Nerves: No cranial nerve deficit.      ED Course/ Medical Decision Making/ A&P    Procedures Procedures   Medications Ordered in ED Medications  furosemide  (LASIX ) injection 40 mg (40 mg Intravenous Given 09/27/23 2047)  ondansetron  (ZOFRAN ) injection 4 mg (4 mg Intravenous Given 09/27/23 2048)   Medical Decision Making:   [name] is a 89 y.o. female with a history of CHF, who presented to the ED today with acute on chronic SOB. They are endorsing worsening of their baseline dyspnea over the past 48 hours. Their baseline is a 0L O2 requirement. At their baseline they are able to get around the neighborhood and they are not able to  at this time.   On my initial exam, the pt was SOB and tachypneic. LE edema present.  They are endorsing cough/fever/sputum production.    Reviewed and confirmed nursing documentation for past medical history, family history, social history.    Initial Assessment:   With the patient's presentation of SOB in the above setting, most likely diagnosis is CHF Exacerbation. Other diagnoses were considered including (but not limited to) CAP, PE, ACS, viral infection, PTX. These are considered less likely due to history of present illness and physical exam findings.   This is most consistent with an acute life/limb threatening illness complicated by underlying chronic conditions.  Initial Plan:  Empiric treatment of patient's  symptoms with oxygen administration. Given extremis nature of the presentation, patient required non invasive ventilation initiation. This ultimately failed and patient with worsening extremis required intubation as above  EKG/Troponin testing/BNP testing to evaluate for cardiac pathology. Evaluation for infectious versus intrathoracic abnormality with chest x-ray  Evaluation for volume overload with BNP  Screening labs including CBC and Metabolic panel to evaluate for infectious or metabolic etiology of disease.  Patient's Wells score is LOW and patient does not warrant further objective evaluation for PE based on consistency of presentation of alternative diagnosis.  Objective evaluation as below reviewed   Initial Study Results:   Laboratory  All laboratory results reviewed without evidence of clinically relevant pathology.    EKG EKG was reviewed independently. Rate, rhythm, axis, intervals all examined and without medically relevant abnormality. ST segments without concerns for elevations.    Radiology:  All images reviewed independently. Agree with radiology report at this time.   DG Chest Port 1 View Result Date: 09/27/2023 CLINICAL DATA:  Short of breath, atrial fibrillation EXAM: PORTABLE CHEST 1 VIEW COMPARISON:  09/04/2023 FINDINGS: Single frontal view of the chest demonstrates an enlarged cardiac silhouette. Bibasilar veiling opacities, left greater than right, consistent with consolidation and effusions. No pneumothorax. No acute bony abnormalities. IMPRESSION: 1. Findings consistent with congestive heart failure, with bibasilar consolidation and effusions. Electronically Signed   By: Bobbye Burrow M.D.   On: 09/27/2023 20:09   DG Chest 2 View Result Date: 09/04/2023 CLINICAL DATA:  Dyspnea on exertion EXAM: CHEST - 2 VIEW COMPARISON:  Chest x-ray 12/03/2019 FINDINGS: There are new small bilateral pleural effusions, left greater than right. There is no focal lung infiltrate. The  heart is mildly enlarged. No pneumothorax. Triangular shaped density overlies the left lung apex is likely related to overlying soft tissues. No acute fractures are seen. IMPRESSION: 1. New small bilateral pleural effusions, left greater than right. 2. Mild cardiomegaly. Electronically Signed   By: Tyron Gallon M.D.   On: 09/04/2023 17:54   Final Assessment and Plan:   She was initiated on diureses above typical dose. Reassessed after initiation of medical therapies, patient is grossly improved and no longer in acute distress.    Given the advanced nature of the patient's presentation, patient will require advanced care and admission was arranged.     Clinical Impression:  1. Hypoxia   2. SOB (shortness of breath)      Admit   Final Clinical Impression(s) / ED Diagnoses Final diagnoses:  Hypoxia  SOB (shortness of breath)    Rx / DC Orders ED Discharge Orders     None         Onetha Bile, MD 09/27/23 2307

## 2023-09-27 NOTE — H&P (Signed)
 History and Physical    [name]:[id number] DOB: 12-30-1927 DOA: 09/27/2023  PCP: [name], MD  Patient coming from: Home  Chief Complaint: Shortness of breath  HPI: [name] is a 89 y.o. female with medical history significant of chronic A-fib, adrenal myolipoma/renal angiomyolipoma, apical variant hypertrophic cardiomyopathy, CKD stage IIIa, diverticulosis, dysmetabolic syndrome X, fibromyalgia, gastric ulcer due to H. pylori, hiatal hernia, GERD, hyperlipidemia, colon polyp, hypertension, hypothyroidism, lung nodule, anemia, osteoarthritis, osteoporosis, anxiety.  Patient was seen by PCP today for shortness of breath and sent to the ED for further evaluation due to concern for heart failure and anemia/upper GI bleed on Eliquis .  Patient is not taking Lasix  at home due to nausea.  In the ED, patient was afebrile.  Not tachycardic or tachypneic.  Not hypoxic, satting 100% on room air.  Labs showing no leukocytosis, hemoglobin 8.7 (previously 9.3 on 09/04/2023 and 10.8 on 01/21/2023), MCV 88.3, creatinine 1.0.  Chest x-ray showing findings consistent with CHF with bibasilar consolidation and effusions. Patient was given IV Lasix  40 mg and Zofran  in the ED.  TRH called to admit.  History provided by the patient and her daughter at bedside.  Patient has had progressive dyspnea on exertion for the past 1 week.  Daughter states patient was seen by her PCP earlier this month and was told that she had fluid in her lungs and was prescribed Lasix  but she has not been able to take it due to nausea.  No chest pain.  Daughter is concerned that patient's appetite has been very poor for the past few months and she is losing weight.  Reporting unintentional weight loss of about 10 pounds.  No vomiting or abdominal pain.  She is having regular bowel movements.  Patient is on Eliquis  but denies hematemesis, hematochezia, melena, or hematuria.  Review of Systems:  Review of Systems  All other  systems reviewed and are negative.   Past Medical History:  Diagnosis Date   Adrenal myelolipoma    Apical variant hypertrophic cardiomyopathy (HCC)    Echo 4/18: Normal LV size with marked apical hypertrophy. LVEF 60-65%. No WMA. Mild MR. Moderate left atrial enlargement. Normal RV size and function. Mild right atrial enlargemen   Chronic kidney disease    DIVERTICULOSIS, COLON    Dysmetabolic syndrome X    FIBROMYALGIA    Gastric ulcer due to Helicobacter pylori    Hiatal hernia    Hx of cataract surgery 01/08/2016   HYPERLIPIDEMIA    Hyperplastic colon polyp    HYPERTENSION, ESSENTIAL NOS    Hyponatremia    Hypothyroidism    MYCOSIS FUNGOIDES    Nodule of left lung    Normocytic anemia    Olecranon fracture 01/07/2016   OSTEOARTHRITIS    OSTEOPOROSIS    Persistent atrial fibrillation (HCC)    s/p DCCV 2018 // Apixaban  for anticoag   Renal angiomyolipoma    Unspecified vitamin D  deficiency     Past Surgical History:  Procedure Laterality Date   CARDIOVERSION N/A 01/03/2017   Procedure: CARDIOVERSION;  Surgeon: Loyde Rule, MD;  Location: [facility] ENDOSCOPY;  Service: Cardiovascular;  Laterality: N/A;   colonoscopy with polypectomy  07/25/2007   Dr Grandville Lax, Recheck in 7 years for 2009   G 4 P 2     I & D EXTREMITY Right 01/08/2016   Procedure: IRRIGATION AND DEBRIDEMENT OF OLECRANON FRACTURE;  Surgeon: Saundra Curl, MD;  Location: [facility] OR;  Service: Orthopedics;  Laterality: Right;  ORIF ELBOW FRACTURE Right 01/08/2016   Procedure: OPEN REDUCTION INTERNAL FIXATION (ORIF) ELBOW/OLECRANON FRACTURE, NEUROLYSIS;  Surgeon: Saundra Curl, MD;  Location: [facility] OR;  Service: Orthopedics;  Laterality: Right;     reports that she quit smoking about 69 years ago. Her smoking use included cigarettes. She started smoking about 83 years ago. She has a 7 pack-year smoking history. She has never used smokeless tobacco. She reports that she does not drink alcohol and does not use  drugs.  Allergies  Allergen Reactions   Achromycin [Tetracycline Hcl] Swelling and Other (See Comments)    Reaction:  Facial swelling    Celecoxib Diarrhea   Clarithromycin  Other (See Comments)    Reaction:  Unknown    Ezetimibe-Simvastatin  Diarrhea, Nausea And Vomiting and Other (See Comments)    Reaction:  Muscle pain    Flagyl  [Metronidazole ] Other (See Comments)    hallucinations   Penicillins Anaphylaxis and Other (See Comments)    Has patient had a PCN reaction causing immediate rash, facial/tongue/throat swelling, SOB or lightheadedness with hypotension: Yes Has patient had a PCN reaction causing severe rash involving mucus membranes or skin necrosis: No Has patient had a PCN reaction that required hospitalization No Has patient had a PCN reaction occurring within the last 10 years: No If all of the above answers are "NO", then may proceed with Cephalosporin use.   Simvastatin  Other (See Comments)    Reaction:  Muscle pain    Clonazepam      SOB, weakness   Erythromycin    Sertraline      hyponatremia   Tramadol      Woozy, off balanced   Abilify  [Aripiprazole ] Other (See Comments)    Reaction:  Insomnia     Family History  Problem Relation Age of Onset   Diabetes Mother    Heart attack Maternal Uncle 68       his 2 sons had MI in 31s   Hypertension Maternal Grandmother    Stroke Maternal Grandmother 25    Prior to Admission medications   Medication Sig Start Date End Date Taking? Authorizing Provider  AREXVY 120 MCG/0.5ML injection  03/25/23   [provider]  carvedilol  (COREG ) 6.25 MG tablet TAKE 1 TABLET BY MOUTH TWICE A DAY 05/30/23   Von Grumbling, PA-C  Cholecalciferol  50 MCG (2000 UT) CAPS Take 1 capsule (2,000 Units total) by mouth daily. 07/01/20   [name], MD  ELIQUIS  5 MG TABS tablet TAKE 1 TABLET BY MOUTH TWICE A DAY 06/11/23   Von Grumbling, PA-C  ferrous sulfate 324 MG TBEC Take 324 mg by mouth.    [provider]  furosemide   (LASIX ) 20 MG tablet Take 1 tablet (20 mg total) by mouth daily as needed. 09/06/23   [name], MD  levothyroxine  (SYNTHROID ) 75 MCG tablet TAKE 1 TABLET BY MOUTH DAILY 5 DAYS A WEEK ONLY 09/06/23   [name], MD  lisinopril  (ZESTRIL ) 40 MG tablet TAKE 1 TABLET BY MOUTH EVERY DAY 07/03/23   Lucendia Rusk, MD  LORazepam  (ATIVAN ) 0.5 MG tablet TAKE 1/2 TABLET BY MOUTH DAILY AS NEEDED FOR ANXIETY 07/04/23   [name], MD  mupirocin  ointment (BACTROBAN ) 2 % On leg wound w/dressing change qd or bid Patient not taking: Reported on 09/27/2023 06/20/23   Plotnikov, Oakley Bellman, MD  omeprazole  (PRILOSEC) 40 MG capsule TAKE 1 CAPSULE BY MOUTH EVERY DAY 01/28/23   [name], MD  ondansetron  (ZOFRAN -ODT) 4 MG disintegrating tablet  Take 1 tablet (4 mg total) by mouth every 8 (eight) hours as needed for nausea or vomiting. 09/04/23   [name], MD  vitamin B-12 (CYANOCOBALAMIN ) 1000 MCG tablet Take 1 tablet (1,000 mcg total) by mouth daily. 07/01/20   [name], MD    Physical Exam: Vitals:   09/27/23 2130 09/27/23 2138 09/27/23 2200 09/27/23 2230  BP: (!) 111/56  101/73 126/67  Pulse: (!) 56  98 79  Resp: 17  18 17   Temp:      TempSrc:      SpO2: 98%  98% 98%  Weight:  53.1 kg    Height:  4\' 10"  (1.473 m)      Physical Exam Vitals reviewed.  Constitutional:      General: She is not in acute distress. HENT:     Head: Normocephalic and atraumatic.  Eyes:     Extraocular Movements: Extraocular movements intact.  Neck:     Comments: +JVD Cardiovascular:     Rate and Rhythm: Normal rate and regular rhythm.     Pulses: Normal pulses.  Pulmonary:     Effort: Pulmonary effort is normal. No respiratory distress.     Breath sounds: Rales present. No wheezing.  Abdominal:     General: Bowel sounds are normal. There is no distension.     Palpations: Abdomen is soft.     Tenderness: There is no abdominal tenderness. There is no guarding.  Musculoskeletal:     Cervical back:  Normal range of motion.     Right lower leg: No edema.     Left lower leg: No edema.  Skin:    General: Skin is warm and dry.  Neurological:     General: No focal deficit present.     Mental Status: She is alert and oriented to person, place, and time.     Labs on Admission: I have personally reviewed following labs and imaging studies  CBC: Recent Labs  Lab 09/27/23 1927  WBC 6.6  NEUTROABS 4.1  HGB 8.7*  HCT 31.6*  MCV 88.3  PLT 327   Basic Metabolic Panel: Recent Labs  Lab 09/27/23 1927  NA 135  K 3.8  CL 104  CO2 22  GLUCOSE 110*  BUN 22  CREATININE 1.09*  CALCIUM  9.1   GFR: Estimated Creatinine Clearance: 23.3 mL/min (A) (by C-G formula based on SCr of 1.09 mg/dL (H)). Liver Function Tests: Recent Labs  Lab 09/27/23 1927  AST 14*  ALT 8  ALKPHOS 53  BILITOT 0.6  PROT 6.6  ALBUMIN 3.6   No results for input(s): "LIPASE", "AMYLASE" in the last 168 hours. No results for input(s): "AMMONIA" in the last 168 hours. Coagulation Profile: No results for input(s): "INR", "PROTIME" in the last 168 hours. Cardiac Enzymes: No results for input(s): "CKTOTAL", "CKMB", "CKMBINDEX", "TROPONINI" in the last 168 hours. BNP (last 3 results) Recent Labs    09/06/23 1424  PROBNP 1,482.0*   HbA1C: No results for input(s): "HGBA1C" in the last 72 hours. CBG: No results for input(s): "GLUCAP" in the last 168 hours. Lipid Profile: No results for input(s): "CHOL", "HDL", "LDLCALC", "TRIG", "CHOLHDL", "LDLDIRECT" in the last 72 hours. Thyroid  Function Tests: No results for input(s): "TSH", "T4TOTAL", "FREET4", "T3FREE", "THYROIDAB" in the last 72 hours. Anemia Panel: No results for input(s): "VITAMINB12", "FOLATE", "FERRITIN", "TIBC", "IRON", "RETICCTPCT" in the last 72 hours. Urine analysis:    Component Value Date/Time   COLORURINE YELLOW 11/08/2013 2123   APPEARANCEUR CLEAR 11/08/2013 2123  LABSPEC 1.008 11/08/2013 2123   PHURINE 6.5 11/08/2013 2123    GLUCOSEU NEGATIVE 11/08/2013 2123   HGBUR NEGATIVE 11/08/2013 2123   BILIRUBINUR NEGATIVE 11/08/2013 2123   KETONESUR NEGATIVE 11/08/2013 2123   PROTEINUR NEGATIVE 11/08/2013 2123   UROBILINOGEN 0.2 11/08/2013 2123   NITRITE NEGATIVE 11/08/2013 2123   LEUKOCYTESUR TRACE (A) 11/08/2013 2123    Radiological Exams on Admission: DG Chest Port 1 View Result Date: 09/27/2023 CLINICAL DATA:  Short of breath, atrial fibrillation EXAM: PORTABLE CHEST 1 VIEW COMPARISON:  09/04/2023 FINDINGS: Single frontal view of the chest demonstrates an enlarged cardiac silhouette. Bibasilar veiling opacities, left greater than right, consistent with consolidation and effusions. No pneumothorax. No acute bony abnormalities. IMPRESSION: 1. Findings consistent with congestive heart failure, with bibasilar consolidation and effusions. Electronically Signed   By: Bobbye Burrow M.D.   On: 09/27/2023 20:09    EKG: Independently reviewed.  Rate controlled A-fib, borderline T wave abnormalities.  No significant change since previous tracing.  Assessment and Plan  Acute CHF exacerbation Last echo done in April 2018 showing EF 60 to 65%, mild mitral regurgitation, moderate tricuspid regurgitation. Patient is presenting with progressively worsening dyspnea on exertion for the past 1 week.  She has not been able to take oral Lasix  at home due to nausea. Chest x-ray showing findings consistent with CHF with bibasilar consolidation and effusions.  Pneumonia less likely given no fever or leukocytosis.  She has rales and JVD on exam.  Not hypoxia or respiratory distress.  Continue diuresis with IV Lasix  40 mg twice daily.  Monitor intake and output, daily weights, renal function.  Repeat echocardiogram ordered.  Acute on chronic normocytic anemia Patient is on Eliquis  but denies any symptoms of GI bleed or any other bleeding. Hemoglobin 8.7 (previously 9.3 on 09/04/2023 and 10.8 on 01/21/2023).  FOBT and anemia panel  ordered.  Nausea, loss of appetite, unintentional weight loss CT abdomen pelvis ordered and will need further evaluation by GI.  History of gastric ulcer due to H. pylori and last EGD was 10 years ago.  Last colonoscopy was over 16 years ago.  Continue PPI and antiemetic as needed.  CKD stage IIIa Creatinine stable, monitor labs.  Chronic A-fib Currently rate controlled.  Holding Eliquis  until FOBT is checked and also pharmacy med rec currently pending.  Hypothyroidism Patient is on Synthroid  and TSH suppressed on labs done earlier this month.  This could also possibly be contributing to unintentional weight loss.  Pharmacy med rec pending.  Hyperlipidemia Hypertension Anxiety Pharmacy med rec pending.  DVT prophylaxis: SCDs Code Status: DNR-prearrest interventions desired (discussed with the patient and her daughter) Family Communication: Daughter at bedside. Level of care: Telemetry bed Admission status: It is my clinical opinion that referral for OBSERVATION is reasonable and necessary in this patient based on the above information provided. The aforementioned taken together are felt to place the patient at high risk for further clinical deterioration. However, it is anticipated that the patient may be medically stable for discharge from the hospital within 24 to 48 hours.  Juliette Oh MD [facility]  If 7PM-7AM, please contact night-coverage [email or web address]  09/27/2023, 11:41 PM

## 2023-09-27 NOTE — Assessment & Plan Note (Signed)
 Chronic No edema edema today Not currently taking lasix 

## 2023-09-27 NOTE — Assessment & Plan Note (Signed)
 Subacute Likely combination anemia and HF SOB not improved even with lasix  which she has not taken recently Referred to ED for further evaluation

## 2023-09-27 NOTE — ED Triage Notes (Addendum)
 Pt c/o SOB for several months. Hx a-fib. On Eliquis . Pt was called by PCP for abnormal labs- concern for anemia and elevated BNP.    AOx4

## 2023-09-28 ENCOUNTER — Observation Stay (HOSPITAL_COMMUNITY)

## 2023-09-28 ENCOUNTER — Observation Stay (HOSPITAL_BASED_OUTPATIENT_CLINIC_OR_DEPARTMENT_OTHER)

## 2023-09-28 ENCOUNTER — Other Ambulatory Visit: Payer: Self-pay | Admitting: Internal Medicine

## 2023-09-28 DIAGNOSIS — K219 Gastro-esophageal reflux disease without esophagitis: Secondary | ICD-10-CM | POA: Diagnosis present

## 2023-09-28 DIAGNOSIS — Z66 Do not resuscitate: Secondary | ICD-10-CM | POA: Diagnosis present

## 2023-09-28 DIAGNOSIS — E8881 Metabolic syndrome: Secondary | ICD-10-CM | POA: Diagnosis present

## 2023-09-28 DIAGNOSIS — R634 Abnormal weight loss: Secondary | ICD-10-CM

## 2023-09-28 DIAGNOSIS — Z87892 Personal history of anaphylaxis: Secondary | ICD-10-CM | POA: Diagnosis not present

## 2023-09-28 DIAGNOSIS — E785 Hyperlipidemia, unspecified: Secondary | ICD-10-CM | POA: Diagnosis present

## 2023-09-28 DIAGNOSIS — R0602 Shortness of breath: Secondary | ICD-10-CM | POA: Diagnosis present

## 2023-09-28 DIAGNOSIS — Z7901 Long term (current) use of anticoagulants: Secondary | ICD-10-CM | POA: Diagnosis not present

## 2023-09-28 DIAGNOSIS — Z880 Allergy status to penicillin: Secondary | ICD-10-CM | POA: Diagnosis not present

## 2023-09-28 DIAGNOSIS — J9811 Atelectasis: Secondary | ICD-10-CM | POA: Diagnosis not present

## 2023-09-28 DIAGNOSIS — I5023 Acute on chronic systolic (congestive) heart failure: Secondary | ICD-10-CM | POA: Diagnosis present

## 2023-09-28 DIAGNOSIS — E0580 Other thyrotoxicosis without thyrotoxic crisis or storm: Secondary | ICD-10-CM | POA: Diagnosis present

## 2023-09-28 DIAGNOSIS — I5021 Acute systolic (congestive) heart failure: Secondary | ICD-10-CM | POA: Diagnosis not present

## 2023-09-28 DIAGNOSIS — D509 Iron deficiency anemia, unspecified: Secondary | ICD-10-CM | POA: Diagnosis present

## 2023-09-28 DIAGNOSIS — E039 Hypothyroidism, unspecified: Secondary | ICD-10-CM | POA: Diagnosis present

## 2023-09-28 DIAGNOSIS — I130 Hypertensive heart and chronic kidney disease with heart failure and stage 1 through stage 4 chronic kidney disease, or unspecified chronic kidney disease: Secondary | ICD-10-CM | POA: Diagnosis present

## 2023-09-28 DIAGNOSIS — K5730 Diverticulosis of large intestine without perforation or abscess without bleeding: Secondary | ICD-10-CM | POA: Diagnosis not present

## 2023-09-28 DIAGNOSIS — Z8249 Family history of ischemic heart disease and other diseases of the circulatory system: Secondary | ICD-10-CM | POA: Diagnosis not present

## 2023-09-28 DIAGNOSIS — M797 Fibromyalgia: Secondary | ICD-10-CM | POA: Diagnosis present

## 2023-09-28 DIAGNOSIS — N179 Acute kidney failure, unspecified: Secondary | ICD-10-CM | POA: Diagnosis not present

## 2023-09-28 DIAGNOSIS — R0902 Hypoxemia: Secondary | ICD-10-CM | POA: Diagnosis present

## 2023-09-28 DIAGNOSIS — Z87891 Personal history of nicotine dependence: Secondary | ICD-10-CM | POA: Diagnosis not present

## 2023-09-28 DIAGNOSIS — I4819 Other persistent atrial fibrillation: Secondary | ICD-10-CM | POA: Diagnosis not present

## 2023-09-28 DIAGNOSIS — N1831 Chronic kidney disease, stage 3a: Secondary | ICD-10-CM | POA: Diagnosis present

## 2023-09-28 DIAGNOSIS — I422 Other hypertrophic cardiomyopathy: Secondary | ICD-10-CM | POA: Diagnosis present

## 2023-09-28 DIAGNOSIS — I10 Essential (primary) hypertension: Secondary | ICD-10-CM | POA: Diagnosis not present

## 2023-09-28 DIAGNOSIS — I081 Rheumatic disorders of both mitral and tricuspid valves: Secondary | ICD-10-CM | POA: Diagnosis present

## 2023-09-28 DIAGNOSIS — I5031 Acute diastolic (congestive) heart failure: Secondary | ICD-10-CM | POA: Diagnosis not present

## 2023-09-28 DIAGNOSIS — R0600 Dyspnea, unspecified: Secondary | ICD-10-CM | POA: Diagnosis not present

## 2023-09-28 DIAGNOSIS — R7989 Other specified abnormal findings of blood chemistry: Secondary | ICD-10-CM | POA: Diagnosis not present

## 2023-09-28 DIAGNOSIS — I4811 Longstanding persistent atrial fibrillation: Secondary | ICD-10-CM | POA: Diagnosis present

## 2023-09-28 DIAGNOSIS — Z79890 Hormone replacement therapy (postmenopausal): Secondary | ICD-10-CM | POA: Diagnosis not present

## 2023-09-28 DIAGNOSIS — I509 Heart failure, unspecified: Secondary | ICD-10-CM | POA: Diagnosis not present

## 2023-09-28 DIAGNOSIS — Z79899 Other long term (current) drug therapy: Secondary | ICD-10-CM | POA: Diagnosis not present

## 2023-09-28 DIAGNOSIS — J90 Pleural effusion, not elsewhere classified: Secondary | ICD-10-CM | POA: Diagnosis not present

## 2023-09-28 LAB — BASIC METABOLIC PANEL WITH GFR
Anion gap: 12 (ref 5–15)
BUN: 22 mg/dL (ref 8–23)
CO2: 23 mmol/L (ref 22–32)
Calcium: 9.3 mg/dL (ref 8.9–10.3)
Chloride: 105 mmol/L (ref 98–111)
Creatinine, Ser: 1.06 mg/dL — ABNORMAL HIGH (ref 0.44–1.00)
GFR, Estimated: 49 mL/min — ABNORMAL LOW (ref >60)
Glucose, Bld: 98 mg/dL (ref 70–99)
Potassium: 3.9 mmol/L (ref 3.5–5.1)
Sodium: 140 mmol/L (ref 135–145)

## 2023-09-28 LAB — CBC
HCT: 29.3 % — ABNORMAL LOW (ref 36.0–46.0)
Hemoglobin: 8.3 g/dL — ABNORMAL LOW (ref 12.0–15.0)
MCH: 24.6 pg — ABNORMAL LOW (ref 26.0–34.0)
MCHC: 28.3 g/dL — ABNORMAL LOW (ref 30.0–36.0)
MCV: 86.7 fL (ref 80.0–100.0)
Platelets: 275 10*3/uL (ref 150–400)
RBC: 3.38 MIL/uL — ABNORMAL LOW (ref 3.87–5.11)
RDW: 15.1 % (ref 11.5–15.5)
WBC: 5.5 10*3/uL (ref 4.0–10.5)
nRBC: 0 % (ref 0.0–0.2)

## 2023-09-28 LAB — VITAMIN B12: Vitamin B-12: 575 pg/mL (ref 180–914)

## 2023-09-28 LAB — ECHOCARDIOGRAM COMPLETE
Area-P 1/2: 4.68 cm2
Height: 58 in
S' Lateral: 2.8 cm
Weight: 1872 [oz_av]

## 2023-09-28 LAB — IRON AND TIBC
Iron: 20 ug/dL — ABNORMAL LOW (ref 28–170)
Saturation Ratios: 6 % — ABNORMAL LOW (ref 10.4–31.8)
TIBC: 367 ug/dL (ref 250–450)
UIBC: 347 ug/dL

## 2023-09-28 LAB — T4, FREE: Free T4: 1.38 ng/dL — ABNORMAL HIGH (ref 0.61–1.12)

## 2023-09-28 LAB — RETICULOCYTES
Immature Retic Fract: 25.2 % — ABNORMAL HIGH (ref 2.3–15.9)
RBC.: 3.36 MIL/uL — ABNORMAL LOW (ref 3.87–5.11)
Retic Count, Absolute: 54.8 10*3/uL (ref 19.0–186.0)
Retic Ct Pct: 1.6 % (ref 0.4–3.1)

## 2023-09-28 LAB — TROPONIN I (HIGH SENSITIVITY)
Troponin I (High Sensitivity): 17 ng/L (ref <18)
Troponin I (High Sensitivity): 19 ng/L — ABNORMAL HIGH (ref <18)

## 2023-09-28 LAB — ABO/RH: ABO/RH(D): AB POS

## 2023-09-28 LAB — BRAIN NATRIURETIC PEPTIDE: B Natriuretic Peptide: 824.2 pg/mL — ABNORMAL HIGH (ref 0.0–100.0)

## 2023-09-28 LAB — FERRITIN: Ferritin: 17 ng/mL (ref 11–307)

## 2023-09-28 LAB — TSH: TSH: 0.306 u[IU]/mL — ABNORMAL LOW (ref 0.350–4.500)

## 2023-09-28 LAB — FOLATE: Folate: 10.3 ng/mL (ref >5.9)

## 2023-09-28 MED ORDER — ACETAMINOPHEN 325 MG PO TABS
650.0000 mg | ORAL_TABLET | Freq: Four times a day (QID) | ORAL | Status: DC | PRN
  Administered 2023-09-28 – 2023-10-02 (×4): 650 mg via ORAL
  Filled 2023-09-28 (×5): qty 2

## 2023-09-28 MED ORDER — PANTOPRAZOLE SODIUM 40 MG PO TBEC
40.0000 mg | DELAYED_RELEASE_TABLET | Freq: Every day | ORAL | Status: DC
  Administered 2023-09-28 – 2023-10-02 (×5): 40 mg via ORAL
  Filled 2023-09-28 (×5): qty 1

## 2023-09-28 MED ORDER — SODIUM CHLORIDE 0.9 % IV SOLN
100.0000 mg | Freq: Once | INTRAVENOUS | Status: AC
  Administered 2023-09-28: 100 mg via INTRAVENOUS
  Filled 2023-09-28: qty 100

## 2023-09-28 MED ORDER — PERFLUTREN LIPID MICROSPHERE
1.0000 mL | INTRAVENOUS | Status: AC | PRN
  Administered 2023-09-28: 3 mL via INTRAVENOUS

## 2023-09-28 MED ORDER — ONDANSETRON 4 MG PO TBDP
4.0000 mg | ORAL_TABLET | Freq: Three times a day (TID) | ORAL | Status: DC | PRN
  Administered 2023-09-28: 4 mg via ORAL
  Filled 2023-09-28: qty 1

## 2023-09-28 MED ORDER — ACETAMINOPHEN 650 MG RE SUPP: 650.0000 mg | Freq: Four times a day (QID) | RECTAL | Status: DC | PRN

## 2023-09-28 MED ORDER — FUROSEMIDE 10 MG/ML IJ SOLN
40.0000 mg | Freq: Two times a day (BID) | INTRAMUSCULAR | Status: DC
  Administered 2023-09-28 (×2): 40 mg via INTRAVENOUS
  Filled 2023-09-28 (×2): qty 4

## 2023-09-28 NOTE — Care Management Obs Status (Signed)
 MEDICARE OBSERVATION STATUS NOTIFICATION   Patient Details  Name: [name] MRN: [id number] Date of Birth: 10-Feb-1928   Medicare Observation Status Notification Given:  Yes    Levie Ream, RN 09/28/2023, 1:13 PM

## 2023-09-28 NOTE — Progress Notes (Signed)
 Consult placed for Heart Failure HH screen; orders previously placed for Heart Failure Navigation Team.

## 2023-09-28 NOTE — Progress Notes (Signed)
 24 hour urine collection started at 3pm on 4/25.

## 2023-09-28 NOTE — Progress Notes (Signed)
  Echocardiogram 2D Echocardiogram has been performed.  Fain Home RDCS 09/28/2023, 10:04 AM

## 2023-09-28 NOTE — TOC Initial Note (Signed)
 Transition of Care [facility]) - Initial/Assessment Note    Patient Details  Name: [name] MRN: [id number] Date of Birth: 09/22/1927  [facility] CM/SW Contact:    Levie Ream, RN Phone Number: 09/28/2023, 1:20 PM  Clinical Narrative:                 Spoke w/ pt and grand daughter Mathis Som in room; pt says she lives at home; she plans to return at d/c; pt identified POC dtr Raynold Camara [phone]); family will provide transportation; pt verified insurance/PCP; she denied SDOH risks; pt says she has a walker; she does not receive HH services, or home oxygen; TOC will follow.  Expected Discharge Plan: Home/Self Care Barriers to Discharge: Continued Medical Work up   Patient Goals and CMS Choice Patient states their goals for this hospitalization and ongoing recovery are:: home CMS Medicare.gov Compare Post Acute Care list provided to:: Patient Choice offered to / list presented to : Patient  ownership interest in [facility].provided to:: Patient    Expected Discharge Plan and Services   Discharge Planning Services: CM Consult   Living arrangements for the past 2 months: Single Family Home                                      Prior Living Arrangements/Services Living arrangements for the past 2 months: Single Family Home Lives with:: Self Patient language and need for interpreter reviewed:: Yes Do you feel safe going back to the place where you live?: Yes      Need for Family Participation in Patient Care: Yes (Comment) Care giver support system in place?: Yes (comment) Current home services: DME (walker) Criminal Activity/Legal Involvement Pertinent to Current Situation/Hospitalization: No - Comment as needed  Activities of Daily Living   ADL Screening (condition at time of admission) Independently performs ADLs?: Yes (appropriate for developmental age) Is the patient deaf or have difficulty hearing?: Yes Does the  patient have difficulty seeing, even when wearing glasses/contacts?: No Does the patient have difficulty concentrating, remembering, or making decisions?: No  Permission Sought/Granted Permission sought to share information with : Case Manager Permission granted to share information with : Yes, Verbal Permission Granted  Share Information with NAME: Case Manager     Permission granted to share info w Relationship: Raynold Camara (dtr) [phone]     Emotional Assessment Appearance:: Appears stated age Attitude/Demeanor/Rapport: Gracious Affect (typically observed): Accepting Orientation: : Oriented to Self, Oriented to Place, Oriented to  Time Alcohol / Substance Use: Not Applicable Psych Involvement: No (comment)  Admission diagnosis:  SOB (shortness of breath) [R06.02] Hypoxia [R09.02] Acute exacerbation of CHF (congestive heart failure) (HCC) [I50.9] Patient Active Problem List   Diagnosis Date Noted   Unintentional weight loss 09/28/2023   Acute exacerbation of CHF (congestive heart failure) (HCC) 09/27/2023   Low vitamin B12 level 01/21/2023   Chronic lower back pain 07/23/2022   Hearing loss 01/23/2022   Aortic atherosclerosis (HCC) 07/01/2020   Female bladder prolapse 11/15/2017   Iron deficiency anemia 09/10/2017   SOB (shortness of breath) 09/10/2017   Persistent atrial fibrillation (HCC) 08/20/2016   GERD (gastroesophageal reflux disease) 02/20/2016   Right knee pain 01/20/2016   Left hip pain 01/20/2016   Peripheral edema 01/20/2016   Systolic murmur 01/08/2016   Anxiety 07/18/2013   Nodule of left lung 07/18/2013   Nausea 07/18/2013   Normocytic  anemia 07/18/2013   Apical variant hypertrophic cardiomyopathy (HCC) 08/23/2009   Vitamin D  deficiency 01/21/2009   Chronic kidney disease (CKD), stage III (moderate) (HCC) 01/21/2009   DIVERTICULOSIS, COLON 05/21/2008   OSTEOARTHRITIS 06/02/2007   Hypothyroidism 04/28/2007   HYPERLIPIDEMIA 04/28/2007   Essential  hypertension 04/28/2007   Osteoporosis 04/28/2007   FIBROMYALGIA 11/14/2006   PCP:  [name], MD Pharmacy:   [facility] [phone] - [place], [place] - [address] [place] [address] Phone: [phone] Fax: [phone]     Social Drivers of Health (SDOH) Social History: SDOH Screenings   Food Insecurity: No Food Insecurity (09/28/2023)  Housing: Low Risk  (09/28/2023)  Transportation Needs: No Transportation Needs (09/28/2023)  Utilities: Not At Risk (09/28/2023)  Depression (PHQ2-9): Medium Risk (01/21/2023)  Social Connections: Socially Isolated (09/28/2023)  Tobacco Use: Medium Risk (09/27/2023)   SDOH Interventions: Food Insecurity Interventions: Intervention Not Indicated, Inpatient TOC Housing Interventions: Intervention Not Indicated, Inpatient TOC Transportation Interventions: Intervention Not Indicated, Inpatient TOC Utilities Interventions: Intervention Not Indicated, Inpatient TOC   Readmission Risk Interventions     No data to display

## 2023-09-28 NOTE — Progress Notes (Addendum)
 PROGRESS NOTE  [name] XBJ:[id number] DOB: 1927-09-12 DOA: 09/27/2023 PCP: [name], MD  HPI/Recap of past 24 hours: [name] is a 89 y.o. female with medical history significant of chronic A-fib, adrenal myolipoma/renal angiomyolipoma, apical variant hypertrophic cardiomyopathy, CKD stage IIIa, diverticulosis, gastric ulcer due to H. pylori, hyperlipidemia, colon polyp, hypertension, hypothyroidism, anemia. Pt was seen by PCP PTA for SOB for the past 1 week, and sent to the ED for further evaluation due to concern for heart failure and anemia/upper GI bleed on Eliquis . Patient not compliant with Lasix  due to nausea. Daughter concerned that patient's appetite has been very poor for the past few months and reported unintentional weight loss of about 10 pounds. In the ED, VSS, satting 100% on room air.  Labs showed hemoglobin 8.7 (previously 9.3 on 09/04/2023 and 10.8 on 01/21/2023). CXR consistent with CHF with noted bibasilar consolidation and effusions.TRH called to admit.    Today, pt denies any new complaints.  Denies any nausea/vomiting, abdominal pain, chest pain, worsening dyspnea.  Requesting to have some breakfast.  Denies any hematemesis, hematochezia, melanotic stools.     Assessment/Plan: Principal Problem:   Acute exacerbation of CHF (congestive heart failure) (HCC) Active Problems:   Hypothyroidism   HYPERLIPIDEMIA   Normocytic anemia   Unintentional weight loss   Possible acute systolic CHF exacerbation HFrEF Currently on room air BNP 824 Chest x-ray with noted bibasilar consolidations/effusions, consistent with CHF Echo done showed EF of 40 to 45%, regional wall motion abnormality, severe asymmetric left ventricular hypertrophy of the apical segments Troponin pending Consult cardiology Continue diuresis with IV Lasix  40 mg twice daily Monitor intake and output, daily weights, renal function   Acute on chronic normocytic anemia Iron deficiency  anemia Denies any obvious signs of bleeding Hemoglobin 8.7 (previously 9.3 on 09/04/2023 and 10.8 on 01/21/2023) FOBT pending Anemia panel showed iron 20, sats 6, vitamin B12-->575 Give 1 dose of IV iron Continue oral iron supplementation   Nausea, loss of appetite, unintentional weight loss ??Adrenal pheochromocytoma CT abdomen pelvis showed possible known adrenal myelolipoma, additional consideration should include adrenal pheochromocytoma Plasma and urine metanephrines/catecholamines ordered, to rule out pheochromocytoma History of gastric ulcer due to H. Pylori, last EGD was 10 years ago. Last colonoscopy was over 16 years ago.  Continue PPI and antiemetic as needed  Hypertension Hold home Coreg , lisinopril  as BP soft   CKD stage IIIa Creatinine stable, monitor labs   Chronic A-fib Currently rate controlled Hold Coreg  as BP soft Holding Eliquis  for now   Hypothyroidism Last TSH is 0.16, repeat TSH/free T4 pending Hold Synthroid  for now     Estimated body mass index is 24.45 kg/m as calculated from the following:   Height as of this encounter: 4\' 10"  (1.473 m).   Weight as of this encounter: 53.1 kg.     Code Status: DNR  Family Communication: Daughter at bedside  Disposition Plan: Status is: Observation The patient will require care spanning > 2 midnights and should be moved to inpatient because: Level of care      Consultants: Cardiology  Procedures: None  Antimicrobials: None  DVT prophylaxis: SCDs for now   Objective: Vitals:   09/28/23 0000 09/28/23 0001 09/28/23 0050 09/28/23 0500  BP: 95/84  (!) 136/98 (!) 120/58  Pulse: 77  83 85  Resp:  17 18   Temp:  98 F (36.7 C) (!) 97.4 F (36.3 C) 98 F (36.7 C)  TempSrc:  Oral Oral Oral  SpO2: 97%  100% 100%  Weight:      Height:        Intake/Output Summary (Last 24 hours) at 09/28/2023 1224 Last data filed at 09/28/2023 0500 Gross per 24 hour  Intake --  Output 200 ml  Net -200 ml    Filed Weights   09/27/23 2138  Weight: 53.1 kg    Exam: General: NAD  Cardiovascular: S1, S2 present Respiratory: CTAB Abdomen: Soft, nontender, nondistended, bowel sounds present Musculoskeletal: No bilateral pedal edema noted Skin: Normal Psychiatry: Normal mood     Data Reviewed: CBC: Recent Labs  Lab 09/27/23 1927 09/28/23 0538  WBC 6.6 5.5  NEUTROABS 4.1  --   HGB 8.7* 8.3*  HCT 31.6* 29.3*  MCV 88.3 86.7  PLT 327 275   Basic Metabolic Panel: Recent Labs  Lab 09/27/23 1927 09/28/23 0538  NA 135 140  K 3.8 3.9  CL 104 105  CO2 22 23  GLUCOSE 110* 98  BUN 22 22  CREATININE 1.09* 1.06*  CALCIUM  9.1 9.3   GFR: Estimated Creatinine Clearance: 24 mL/min (A) (by C-G formula based on SCr of 1.06 mg/dL (H)). Liver Function Tests: Recent Labs  Lab 09/27/23 1927  AST 14*  ALT 8  ALKPHOS 53  BILITOT 0.6  PROT 6.6  ALBUMIN 3.6   No results for input(s): "LIPASE", "AMYLASE" in the last 168 hours. No results for input(s): "AMMONIA" in the last 168 hours. Coagulation Profile: No results for input(s): "INR", "PROTIME" in the last 168 hours. Cardiac Enzymes: No results for input(s): "CKTOTAL", "CKMB", "CKMBINDEX", "TROPONINI" in the last 168 hours. BNP (last 3 results) Recent Labs    09/06/23 1424  PROBNP 1,482.0*   HbA1C: No results for input(s): "HGBA1C" in the last 72 hours. CBG: No results for input(s): "GLUCAP" in the last 168 hours. Lipid Profile: No results for input(s): "CHOL", "HDL", "LDLCALC", "TRIG", "CHOLHDL", "LDLDIRECT" in the last 72 hours. Thyroid  Function Tests: No results for input(s): "TSH", "T4TOTAL", "FREET4", "T3FREE", "THYROIDAB" in the last 72 hours. Anemia Panel: Recent Labs    09/28/23 0538  VITAMINB12 575  FOLATE 10.3  FERRITIN 17  TIBC 367  IRON 20*  RETICCTPCT 1.6   Urine analysis:    Component Value Date/Time   COLORURINE YELLOW 11/08/2013 2123   APPEARANCEUR CLEAR 11/08/2013 2123   LABSPEC 1.008  11/08/2013 2123   PHURINE 6.5 11/08/2013 2123   GLUCOSEU NEGATIVE 11/08/2013 2123   HGBUR NEGATIVE 11/08/2013 2123   BILIRUBINUR NEGATIVE 11/08/2013 2123   KETONESUR NEGATIVE 11/08/2013 2123   PROTEINUR NEGATIVE 11/08/2013 2123   UROBILINOGEN 0.2 11/08/2013 2123   NITRITE NEGATIVE 11/08/2013 2123   LEUKOCYTESUR TRACE (A) 11/08/2013 2123   Sepsis Labs: @LABRCNTIP (procalcitonin:4,lacticidven:4)  )No results found for this or any previous visit (from the past 240 hours).    Studies: CT ABDOMEN PELVIS WO CONTRAST Result Date: 09/28/2023 CLINICAL DATA:  Unintentional weight loss EXAM: CT ABDOMEN AND PELVIS WITHOUT CONTRAST TECHNIQUE: Multidetector CT imaging of the abdomen and pelvis was performed following the standard protocol without IV contrast. RADIATION DOSE REDUCTION: This exam was performed according to the departmental dose-optimization program which includes automated exposure control, adjustment of the mA and/or kV according to patient size and/or use of iterative reconstruction technique. COMPARISON:  07/17/2013 FINDINGS: Lower chest: Extensive multi-vessel coronary artery calcification. Global cardiac size within limits. No pericardial effusion. Moderate bilateral pleural effusions are present. Hepatobiliary: No focal liver abnormality is seen. No gallstones, gallbladder wall thickening, or biliary dilatation. Pancreas: Unremarkable Spleen: Unremarkable  Adrenals/Urinary Tract: The right adrenal gland is unremarkable. Left and right gland is largely replaced by a rounded mass which demonstrates interval increase in size now measuring 4.4 x 5.2 cm (previously measuring 3.4 x 3 8 cm). Additionally, the mass demonstrates increasingly soft tissue density and small amount of macroscopic fat is identified, this represents a relative warty of the lesion. While and adrenal myelolipoma is not excluded, additional considerations should include adrenal pheochromocytoma which can occasionally contain  small volume macroscopic fat due to lipid degeneration. Punctate foci calcification also noted. The kidneys are normal in size and position. Almost entirely lipomatous left renal angiomyolipoma demonstrates interval increase in size measuring 4.3 x 5.6 roughly 5.5 cm in greatest dimension. The kidneys otherwise. The bladder is unremarkable. Stomach/Bowel: Severe descending and sigmoid diverticulosis. Stomach, small bowel, and large bowel are otherwise unremarkable. Appendix superior. No evidence of obstruction or focal inflammation. No free intraperitoneal gas or fluid. Vascular/Lymphatic: Aortic atherosclerosis. No enlarged abdominal or pelvic lymph nodes. Reproductive: Status post hysterectomy. No adnexal masses. Pessary in place Other: No abdominal wall hernia or abnormality. No abdominopelvic ascites. Musculoskeletal: Osseous structures are diffusely osteopenic. Degenerative changes are seen within the thoracolumbar spine. No acute bone abnormality. IMPRESSION: 1. Interval increase in size and density of a 5.2 cm left adrenal mass. While this may represent an adrenal myelolipoma, additional considerations should include adrenal pheochromocytoma which can occasionally contain small volume macroscopic fat due to lipid degeneration. Correlation with biochemical markers of adrenal pheochromocytoma is recommended. 2. 5.6 cm nearly completely lipomatous left renal angiomyolipoma. 3. Severe descending and sigmoid diverticulosis. 4. Moderate bilateral pleural effusions. 5. Extensive multi-vessel coronary artery calcification. Aortic Atherosclerosis (ICD10-I70.0). Electronically Signed   By: Worthy Heads M.D.   On: 09/28/2023 02:58   DG Chest Port 1 View Result Date: 09/27/2023 CLINICAL DATA:  Short of breath, atrial fibrillation EXAM: PORTABLE CHEST 1 VIEW COMPARISON:  09/04/2023 FINDINGS: Single frontal view of the chest demonstrates an enlarged cardiac silhouette. Bibasilar veiling opacities, left greater than  right, consistent with consolidation and effusions. No pneumothorax. No acute bony abnormalities. IMPRESSION: 1. Findings consistent with congestive heart failure, with bibasilar consolidation and effusions. Electronically Signed   By: Bobbye Burrow M.D.   On: 09/27/2023 20:09    Scheduled Meds:  furosemide   40 mg Intravenous BID   pantoprazole   40 mg Oral Daily    Continuous Infusions:   LOS: 0 days     Veronica Gordon, MD [facility]  If 7PM-7AM, please contact night-coverage [email or web address] 09/28/2023, 12:24 PM

## 2023-09-29 DIAGNOSIS — I422 Other hypertrophic cardiomyopathy: Secondary | ICD-10-CM | POA: Diagnosis not present

## 2023-09-29 DIAGNOSIS — R7989 Other specified abnormal findings of blood chemistry: Secondary | ICD-10-CM

## 2023-09-29 DIAGNOSIS — I4811 Longstanding persistent atrial fibrillation: Secondary | ICD-10-CM

## 2023-09-29 DIAGNOSIS — I5023 Acute on chronic systolic (congestive) heart failure: Secondary | ICD-10-CM | POA: Diagnosis not present

## 2023-09-29 DIAGNOSIS — I10 Essential (primary) hypertension: Secondary | ICD-10-CM

## 2023-09-29 DIAGNOSIS — I5021 Acute systolic (congestive) heart failure: Secondary | ICD-10-CM

## 2023-09-29 DIAGNOSIS — Z7901 Long term (current) use of anticoagulants: Secondary | ICD-10-CM

## 2023-09-29 DIAGNOSIS — Z9181 History of falling: Secondary | ICD-10-CM

## 2023-09-29 DIAGNOSIS — I509 Heart failure, unspecified: Secondary | ICD-10-CM | POA: Diagnosis not present

## 2023-09-29 DIAGNOSIS — E0580 Other thyrotoxicosis without thyrotoxic crisis or storm: Secondary | ICD-10-CM

## 2023-09-29 DIAGNOSIS — I4819 Other persistent atrial fibrillation: Secondary | ICD-10-CM

## 2023-09-29 LAB — CBC WITH DIFFERENTIAL/PLATELET
Abs Immature Granulocytes: 0.02 10*3/uL (ref 0.00–0.07)
Basophils Absolute: 0 10*3/uL (ref 0.0–0.1)
Basophils Relative: 1 %
Eosinophils Absolute: 0.1 10*3/uL (ref 0.0–0.5)
Eosinophils Relative: 1 %
HCT: 30.4 % — ABNORMAL LOW (ref 36.0–46.0)
Hemoglobin: 8.5 g/dL — ABNORMAL LOW (ref 12.0–15.0)
Immature Granulocytes: 0 %
Lymphocytes Relative: 32 %
Lymphs Abs: 1.7 10*3/uL (ref 0.7–4.0)
MCH: 24.9 pg — ABNORMAL LOW (ref 26.0–34.0)
MCHC: 28 g/dL — ABNORMAL LOW (ref 30.0–36.0)
MCV: 88.9 fL (ref 80.0–100.0)
Monocytes Absolute: 0.5 10*3/uL (ref 0.1–1.0)
Monocytes Relative: 9 %
Neutro Abs: 3 10*3/uL (ref 1.7–7.7)
Neutrophils Relative %: 57 %
Platelets: 293 10*3/uL (ref 150–400)
RBC: 3.42 MIL/uL — ABNORMAL LOW (ref 3.87–5.11)
RDW: 15.2 % (ref 11.5–15.5)
WBC: 5.3 10*3/uL (ref 4.0–10.5)
nRBC: 0 % (ref 0.0–0.2)

## 2023-09-29 LAB — BASIC METABOLIC PANEL WITH GFR
Anion gap: 9 (ref 5–15)
BUN: 26 mg/dL — ABNORMAL HIGH (ref 8–23)
CO2: 27 mmol/L (ref 22–32)
Calcium: 9.1 mg/dL (ref 8.9–10.3)
Chloride: 104 mmol/L (ref 98–111)
Creatinine, Ser: 1.49 mg/dL — ABNORMAL HIGH (ref 0.44–1.00)
GFR, Estimated: 33 mL/min — ABNORMAL LOW (ref >60)
Glucose, Bld: 99 mg/dL (ref 70–99)
Potassium: 3.8 mmol/L (ref 3.5–5.1)
Sodium: 140 mmol/L (ref 135–145)

## 2023-09-29 MED ORDER — APIXABAN 2.5 MG PO TABS
2.5000 mg | ORAL_TABLET | Freq: Two times a day (BID) | ORAL | Status: DC
  Administered 2023-09-29 – 2023-10-02 (×7): 2.5 mg via ORAL
  Filled 2023-09-29 (×7): qty 1

## 2023-09-29 MED ORDER — CARVEDILOL 3.125 MG PO TABS
3.1250 mg | ORAL_TABLET | Freq: Two times a day (BID) | ORAL | Status: DC
  Administered 2023-09-29 – 2023-10-02 (×6): 3.125 mg via ORAL
  Filled 2023-09-29 (×6): qty 1

## 2023-09-29 MED ORDER — FUROSEMIDE 10 MG/ML IJ SOLN
20.0000 mg | Freq: Two times a day (BID) | INTRAMUSCULAR | Status: AC
  Administered 2023-09-29 – 2023-09-30 (×2): 20 mg via INTRAVENOUS
  Filled 2023-09-29 (×2): qty 2

## 2023-09-29 NOTE — Consult Note (Signed)
 Cardiology Consultation   Patient ID: [name] MRN: [id number]; DOB: 01-13-1928  Admit date: 09/27/2023 Date of Consult: 09/29/2023  PCP:  [name], MD   [facility] Providers Primary cardiologist: Dr. Nolan Battle Cardiologist:  Avery Bodo, MD       Chief Complaint  Patient presents with   Shortness of Breath   abnormal labs   Reason of consult: Congestive heart failure Referring physician:Ezenduka, Elijio Guadeloupe, MD  Patient Profile:   [name] is a 89 y.o. female with a hx of adrenal myolipoma/renal angiomyolipoma, persistent atrial fibrillation, apical variant hypertrophic cardiomyopathy, hypertension, hyperlipidemia, chronic kidney disease, gastric ulcer due to H. pylori, hiatal hernia, GERD, and anemia.  who is being seen 09/29/2023 for the evaluation of congestive heart failure at the request of Ezenduka, Elijio Guadeloupe, MD.  History of Present Illness:   Ms. [name] has a complex past medical history as discussed above presents to the hospital with a chief complaint of shortness of breath.  She is accompanied by her daughter and granddaughter at bedside.  Shortness of breath has been ongoing for several years but recently more progressive or noticeable.  She has been having shortness of breath with effort related activities with doing stuff around the house which is new for the patient.  She was given Lasix  by her PCP but unable to take due to nausea.  She is also had about 10 pounds of unintentional weight loss (daughter thinks is due to poor oral intake).  She denies orthopnea, PND, lower extremity swelling.  She denies anginal chest pain.  She is on anticoagulation for thromboembolic prophylaxis and does not endorse evidence of bleeding.   Past Medical History:  Diagnosis Date   Adrenal myelolipoma    Apical variant hypertrophic cardiomyopathy (HCC)    Echo 4/18: Normal LV size with marked apical hypertrophy. LVEF 60-65%. No WMA. Mild MR.  Moderate left atrial enlargement. Normal RV size and function. Mild right atrial enlargemen   Chronic kidney disease    DIVERTICULOSIS, COLON    Dysmetabolic syndrome X    FIBROMYALGIA    Gastric ulcer due to Helicobacter pylori    Hiatal hernia    Hx of cataract surgery 01/08/2016   HYPERLIPIDEMIA    Hyperplastic colon polyp    HYPERTENSION, ESSENTIAL NOS    Hyponatremia    Hypothyroidism    MYCOSIS FUNGOIDES    Nodule of left lung    Normocytic anemia    Olecranon fracture 01/07/2016   OSTEOARTHRITIS    OSTEOPOROSIS    Persistent atrial fibrillation (HCC)    s/p DCCV 2018 // Apixaban  for anticoag   Renal angiomyolipoma    Unspecified vitamin D  deficiency     Past Surgical History:  Procedure Laterality Date   CARDIOVERSION N/A 01/03/2017   Procedure: CARDIOVERSION;  Surgeon: Loyde Rule, MD;  Location: [facility] ENDOSCOPY;  Service: Cardiovascular;  Laterality: N/A;   colonoscopy with polypectomy  07/25/2007   Dr Grandville Lax, Recheck in 7 years for 2009   G 4 P 2     I & D EXTREMITY Right 01/08/2016   Procedure: IRRIGATION AND DEBRIDEMENT OF OLECRANON FRACTURE;  Surgeon: Saundra Curl, MD;  Location: [facility] OR;  Service: Orthopedics;  Laterality: Right;   ORIF ELBOW FRACTURE Right 01/08/2016   Procedure: OPEN REDUCTION INTERNAL FIXATION (ORIF) ELBOW/OLECRANON FRACTURE, NEUROLYSIS;  Surgeon: Saundra Curl, MD;  Location: [facility] OR;  Service: Orthopedics;  Laterality: Right;     Home Medications:  Prior to Admission  medications   Medication Sig Start Date End Date Taking? Authorizing Provider  carvedilol  (COREG ) 6.25 MG tablet TAKE 1 TABLET BY MOUTH TWICE A DAY 05/30/23  Yes Von Grumbling, PA-C  Cholecalciferol  50 MCG (2000 UT) CAPS Take 1 capsule (2,000 Units total) by mouth daily. 07/01/20  Yes Burns, Beckey Bourgeois, MD  ELIQUIS  5 MG TABS tablet TAKE 1 TABLET BY MOUTH TWICE A DAY 06/11/23  Yes Conte, Tessa N, PA-C  levothyroxine  (SYNTHROID ) 75 MCG tablet TAKE 1 TABLET BY MOUTH DAILY 5 DAYS A WEEK  ONLY 09/06/23  Yes Burns, Beckey Bourgeois, MD  lisinopril  (ZESTRIL ) 40 MG tablet TAKE 1 TABLET BY MOUTH EVERY DAY 07/03/23  Yes Lucendia Rusk, MD  LORazepam  (ATIVAN ) 0.5 MG tablet TAKE 1/2 TABLET BY MOUTH DAILY AS NEEDED FOR ANXIETY 07/04/23  Yes Burns, Beckey Bourgeois, MD  omeprazole  (PRILOSEC) 40 MG capsule TAKE 1 CAPSULE BY MOUTH EVERY DAY 01/28/23  Yes Burns, Beckey Bourgeois, MD  ondansetron  (ZOFRAN -ODT) 4 MG disintegrating tablet Take 1 tablet (4 mg total) by mouth every 8 (eight) hours as needed for nausea or vomiting. 09/04/23  Yes Burns, Beckey Bourgeois, MD  vitamin B-12 (CYANOCOBALAMIN ) 1000 MCG tablet Take 1 tablet (1,000 mcg total) by mouth daily. 07/01/20  Yes [name], MD  AREXVY 120 MCG/0.5ML injection  03/25/23   [provider]  ferrous sulfate 324 MG TBEC Take 324 mg by mouth.    [provider]  furosemide  (LASIX ) 20 MG tablet Take 1 tablet (20 mg total) by mouth daily as needed. Patient not taking: Reported on 09/28/2023 09/06/23   [name], MD  mupirocin  ointment (BACTROBAN ) 2 % On leg wound w/dressing change qd or bid Patient not taking: Reported on 09/27/2023 06/20/23   Plotnikov, Aleksei V, MD    Inpatient Medications: Scheduled Meds:  apixaban   2.5 mg Oral BID   carvedilol   3.125 mg Oral BID WC   furosemide   20 mg Intravenous Q12H   pantoprazole   40 mg Oral Daily   Continuous Infusions:  PRN Meds: acetaminophen  **OR** acetaminophen , ondansetron   Allergies:    Allergies  Allergen Reactions   Achromycin [Tetracycline Hcl] Swelling and Other (See Comments)    Reaction:  Facial swelling    Celecoxib Diarrhea   Clarithromycin  Other (See Comments)    Reaction:  Unknown    Ezetimibe-Simvastatin  Diarrhea, Nausea And Vomiting and Other (See Comments)    Reaction:  Muscle pain    Flagyl  [Metronidazole ] Other (See Comments)    hallucinations   Penicillins Anaphylaxis and Other (See Comments)    Has patient had a PCN reaction causing immediate rash, facial/tongue/throat  swelling, SOB or lightheadedness with hypotension: Yes Has patient had a PCN reaction causing severe rash involving mucus membranes or skin necrosis: No Has patient had a PCN reaction that required hospitalization No Has patient had a PCN reaction occurring within the last 10 years: No If all of the above answers are "NO", then may proceed with Cephalosporin use.   Simvastatin  Other (See Comments)    Reaction:  Muscle pain    Clonazepam      SOB, weakness   Erythromycin    Sertraline      hyponatremia   Tramadol      Woozy, off balanced   Abilify  [Aripiprazole ] Other (See Comments)    Reaction:  Insomnia     Social History:   Social History   Socioeconomic History   Marital status: Widowed    Spouse name: Not on file   Number  of children: 2   Years of education: Not on file   Highest education level: Not on file  Occupational History   Occupation: Retired    Associate Professor: RETIRED  Tobacco Use   Smoking status: Former    Current packs/day: 0.00    Average packs/day: 0.5 packs/day for 14.0 years (7.0 ttl pk-yrs)    Types: Cigarettes    Start date: 5    Quit date: 1956    Years since quitting: 69.3   Smokeless tobacco: Never   Tobacco comments:    smoked 1951-1965 , up to 1/2 ppd  Vaping Use   Vaping status: Never Used  Substance and Sexual Activity   Alcohol use: No   Drug use: No   Sexual activity: Not Currently    Partners: Male    Birth control/protection: Post-menopausal  Other Topics Concern   Not on file  Social History Narrative   Widowed.  Lives alone.  Ambulates without assistance.  Steady on feet.   Social Drivers of Corporate investment banker Strain: Not on file  Food Insecurity: No Food Insecurity (09/28/2023)   Hunger Vital Sign    Worried About Running Out of Food in the Last Year: Never true    Ran Out of Food in the Last Year: Never true  Transportation Needs: No Transportation Needs (09/28/2023)   PRAPARE - Scientist, research (physical sciences) (Medical): No    Lack of Transportation (Non-Medical): No  Physical Activity: Not on file  Stress: Not on file  Social Connections: Socially Isolated (09/28/2023)   Social Connection and Isolation Panel [NHANES]    Frequency of Communication with Friends and Family: Twice a week    Frequency of Social Gatherings with Friends and Family: Twice a week    Attends Religious Services: Never    Database administrator or Organizations: No    Attends Banker Meetings: Never    Marital Status: Widowed  Intimate Partner Violence: Not At Risk (09/28/2023)   Humiliation, Afraid, Rape, and Kick questionnaire    Fear of Current or Ex-Partner: No    Emotionally Abused: No    Physically Abused: No    Sexually Abused: No    Family History:   Family History  Problem Relation Age of Onset   Diabetes Mother    Heart attack Maternal Uncle 3       his 2 sons had MI in 23s   Hypertension Maternal Grandmother    Stroke Maternal Grandmother 46     ROS:  Review of Systems  Constitutional: Positive for malaise/fatigue and weight loss.  Cardiovascular:  Positive for dyspnea on exertion. Negative for chest pain, claudication, irregular heartbeat, leg swelling, near-syncope, orthopnea, palpitations, paroxysmal nocturnal dyspnea and syncope.  Hematologic/Lymphatic: Negative for bleeding problem.  Gastrointestinal:  Positive for nausea.     Physical Exam/Data:   Vitals:   09/28/23 0500 09/28/23 1304 09/28/23 1939 09/29/23 0441  BP: (!) 120/58 (!) 113/52 (!) 106/59 (!) 107/56  Pulse: 85 68 77 86  Resp:  20    Temp: 98 F (36.7 C) (!) 97.1 F (36.2 C) 97.7 F (36.5 C) 97.6 F (36.4 C)  TempSrc: Oral  Oral Oral  SpO2: 100% 99% 95% 97%  Weight:    55.3 kg  Height:        Intake/Output Summary (Last 24 hours) at 09/29/2023 1135 Last data filed at 09/29/2023 0450 Gross per 24 hour  Intake 600 ml  Output 850 ml  Net -250 ml      09/29/2023    4:41 AM 09/27/2023     9:38 PM 09/27/2023    4:12 PM  Last 3 Weights  Weight (lbs) 121 lb 14.6 oz 117 lb 117 lb  Weight (kg) 55.3 kg 53.071 kg 53.071 kg     Body mass index is 25.48 kg/m.   Physical Exam  Constitutional: No distress.  hemodynamically stable  Neck: No JVD present.  Cardiovascular: Normal rate, S1 normal and S2 normal. An irregularly irregular rhythm present. Exam reveals no gallop, no S3 and no S4.  No murmur heard. Pulmonary/Chest: Effort normal and breath sounds normal. No stridor. She has no wheezes. She has no rales.  Musculoskeletal:        General: No edema.     Cervical back: Neck supple.  Skin: Skin is warm.   EKG:  The EKG was personally reviewed and demonstrates:  September 27, 2023: Atrial fibrillation, 87 bpm, without underlying ischemia or injury pattern.  Telemetry:  Telemetry was personally reviewed and demonstrates: Atrial fibrillation with controlled ventricular rate.  Relevant CV Studies: Echo  09/2016: LVEF 60 to 65%, left atrium moderately dilated, right ventricular size and function normal, right atrium mildly dilated, moderate TR PASP 33 mmHg, severe apical hypertrophy consistent with apical form of hypertrophic cardiomyopathy.  09/28/2023  1. Left ventricular ejection fraction, by estimation, is 40 to 45%. The left ventricle has mildly decreased function. The left ventricle demonstrates regional wall motion abnormalities (see scoring diagram/findings for description). There is severe asymmetric left ventricular hypertrophy of the apical segment.   2. Right ventricular systolic function is normal. The right ventricular size is normal.   3. Left atrial size was severely dilated.   4. The mitral valve is normal in structure. No evidence of mitral valve regurgitation. No evidence of mitral stenosis.   5. The aortic valve is normal in structure. There is moderate calcification of the aortic valve. There is moderate thickening of the aortic valve. Aortic valve regurgitation  is not visualized. No aortic stenosis is present.   6. The inferior vena cava is normal in size with greater than 50% respiratory variability, suggesting right atrial pressure of 3 mmHg.  -- Personally reviewed the echocardiogram.  LVEF is mildly reduced at 45-50%, agree with apical regional wall motion abnormality, and findings suggestive of apical hypertrophic cardiomyopathy.  Laboratory Data:  High Sensitivity Troponin:   Recent Labs  Lab 09/28/23 1349 09/28/23 1555  TROPONINIHS 17 19*     Chemistry Recent Labs  Lab 09/27/23 1927 09/28/23 0538 09/29/23 0549  NA 135 140 140  K 3.8 3.9 3.8  CL 104 105 104  CO2 22 23 27   GLUCOSE 110* 98 99  BUN 22 22 26*  CREATININE 1.09* 1.06* 1.49*  CALCIUM  9.1 9.3 9.1  GFRNONAA 47* 49* 33*  ANIONGAP 9 12 9     Recent Labs  Lab 09/27/23 1927  PROT 6.6  ALBUMIN 3.6  AST 14*  ALT 8  ALKPHOS 53  BILITOT 0.6   Lipids No results for input(s): "CHOL", "TRIG", "HDL", "LABVLDL", "LDLCALC", "CHOLHDL" in the last 168 hours.  Hematology Recent Labs  Lab 09/27/23 1927 09/28/23 0538 09/29/23 0549  WBC 6.6 5.5 5.3  RBC 3.58* 3.38*  3.36* 3.42*  HGB 8.7* 8.3* 8.5*  HCT 31.6* 29.3* 30.4*  MCV 88.3 86.7 88.9  MCH 24.3* 24.6* 24.9*  MCHC 27.5* 28.3* 28.0*  RDW 15.1 15.1 15.2  PLT 327 275 293   Thyroid   Recent  Labs  Lab 09/28/23 1349  TSH 0.306*  FREET4 1.38*    BNP Recent Labs  Lab 09/28/23 0538  BNP 824.2*    DDimer No results for input(s): "DDIMER" in the last 168 hours.   Radiology/Studies:  ECHOCARDIOGRAM COMPLETE Result Date: 09/28/2023    ECHOCARDIOGRAM REPORT   Patient Name:   [name] Date of Exam: 09/28/2023 Medical Rec #:  [id number]        Height:       58.0 in Accession #:    [id number]       Weight:       117.0 lb Date of Birth:  12/15/1927        BSA:          1.450 m Patient Age:    89 years         BP:           120/58 mmHg Patient Gender: F                HR:           73 bpm. Exam Location:  Inpatient  Procedure: 2D Echo, Color Doppler, Cardiac Doppler and Intracardiac            Opacification Agent (Both Spectral and Color Flow Doppler were            utilized during procedure). Indications:    CHF Acute Diastolic I50.31  History:        Patient has no prior history of Echocardiogram examinations.  Sonographer:    Hersey Lorenzo RDCS Referring Phys: [id number] VASUNDHRA RATHORE IMPRESSIONS  1. Left ventricular ejection fraction, by estimation, is 40 to 45%. The left ventricle has mildly decreased function. The left ventricle demonstrates regional wall motion abnormalities (see scoring diagram/findings for description). There is severe asymmetric left ventricular hypertrophy of the apical segment.  2. Right ventricular systolic function is normal. The right ventricular size is normal.  3. Left atrial size was severely dilated.  4. The mitral valve is normal in structure. No evidence of mitral valve regurgitation. No evidence of mitral stenosis.  5. The aortic valve is normal in structure. There is moderate calcification of the aortic valve. There is moderate thickening of the aortic valve. Aortic valve regurgitation is not visualized. No aortic stenosis is present.  6. The inferior vena cava is normal in size with greater than 50% respiratory variability, suggesting right atrial pressure of 3 mmHg. FINDINGS  Left Ventricle: Left ventricular ejection fraction, by estimation, is 40 to 45%. The left ventricle has mildly decreased function. The left ventricle demonstrates regional wall motion abnormalities. The left ventricular internal cavity size was normal in size. There is severe asymmetric left ventricular hypertrophy of the apical segment.  LV Wall Scoring: The entire apex is akinetic. Right Ventricle: The right ventricular size is normal. No increase in right ventricular wall thickness. Right ventricular systolic function is normal. Left Atrium: Left atrial size was severely dilated. Right Atrium: Right atrial size  was normal in size. Pericardium: There is no evidence of pericardial effusion. Mitral Valve: The mitral valve is normal in structure. Mild mitral annular calcification. No evidence of mitral valve regurgitation. No evidence of mitral valve stenosis. Tricuspid Valve: The tricuspid valve is normal in structure. Tricuspid valve regurgitation is not demonstrated. No evidence of tricuspid stenosis. Aortic Valve: The aortic valve is normal in structure. There is moderate calcification of the aortic valve. There is moderate thickening of the aortic valve. Aortic valve regurgitation  is not visualized. No aortic stenosis is present. Pulmonic Valve: The pulmonic valve was normal in structure. Pulmonic valve regurgitation is not visualized. No evidence of pulmonic stenosis. Aorta: The aortic root is normal in size and structure. Venous: The inferior vena cava is normal in size with greater than 50% respiratory variability, suggesting right atrial pressure of 3 mmHg. IAS/Shunts: No atrial level shunt detected by color flow Doppler. Additional Comments: Severe apical hypertrophy cardiomyopathy variant.  LEFT VENTRICLE PLAX 2D LVIDd:         3.90 cm LVIDs:         2.80 cm LV PW:         0.80 cm LV IVS:        0.80 cm LVOT diam:     1.50 cm LV SV:         37 LV SV Index:   26 LVOT Area:     1.77 cm  RIGHT VENTRICLE            IVC RV S prime:     4.78 cm/s  IVC diam: 1.10 cm TAPSE (M-mode): 0.9 cm LEFT ATRIUM             Index        RIGHT ATRIUM           Index LA diam:        3.90 cm 2.69 cm/m   RA Area:     13.50 cm LA Vol (A2C):   79.9 ml 55.10 ml/m  RA Volume:   27.90 ml  19.24 ml/m LA Vol (A4C):   56.6 ml 39.03 ml/m LA Biplane Vol: 68.0 ml 46.90 ml/m  AORTIC VALVE LVOT Vmax:   90.70 cm/s LVOT Vmean:  67.100 cm/s LVOT VTI:    0.210 m  AORTA Ao Root diam: 2.50 cm Ao Asc diam:  2.60 cm MITRAL VALVE MV Area (PHT): 4.68 cm     SHUNTS MV Decel Time: 162 msec     Systemic VTI:  0.21 m MV E velocity: 108.00 cm/s  Systemic  Diam: 1.50 cm MV A velocity: 30.80 cm/s MV E/A ratio:  3.51 Dorothye Gathers MD Electronically signed by Dorothye Gathers MD Signature Date/Time: 09/28/2023/1:07:21 PM    Final    CT ABDOMEN PELVIS WO CONTRAST Result Date: 09/28/2023 CLINICAL DATA:  Unintentional weight loss EXAM: CT ABDOMEN AND PELVIS WITHOUT CONTRAST TECHNIQUE: Multidetector CT imaging of the abdomen and pelvis was performed following the standard protocol without IV contrast. RADIATION DOSE REDUCTION: This exam was performed according to the departmental dose-optimization program which includes automated exposure control, adjustment of the mA and/or kV according to patient size and/or use of iterative reconstruction technique. COMPARISON:  07/17/2013 FINDINGS: Lower chest: Extensive multi-vessel coronary artery calcification. Global cardiac size within limits. No pericardial effusion. Moderate bilateral pleural effusions are present. Hepatobiliary: No focal liver abnormality is seen. No gallstones, gallbladder wall thickening, or biliary dilatation. Pancreas: Unremarkable Spleen: Unremarkable Adrenals/Urinary Tract: The right adrenal gland is unremarkable. Left and right gland is largely replaced by a rounded mass which demonstrates interval increase in size now measuring 4.4 x 5.2 cm (previously measuring 3.4 x 3 8 cm). Additionally, the mass demonstrates increasingly soft tissue density and small amount of macroscopic fat is identified, this represents a relative warty of the lesion. While and adrenal myelolipoma is not excluded, additional considerations should include adrenal pheochromocytoma which can occasionally contain small volume macroscopic fat due to lipid degeneration. Punctate foci calcification also noted. The kidneys are normal  in size and position. Almost entirely lipomatous left renal angiomyolipoma demonstrates interval increase in size measuring 4.3 x 5.6 roughly 5.5 cm in greatest dimension. The kidneys otherwise. The bladder is  unremarkable. Stomach/Bowel: Severe descending and sigmoid diverticulosis. Stomach, small bowel, and large bowel are otherwise unremarkable. Appendix superior. No evidence of obstruction or focal inflammation. No free intraperitoneal gas or fluid. Vascular/Lymphatic: Aortic atherosclerosis. No enlarged abdominal or pelvic lymph nodes. Reproductive: Status post hysterectomy. No adnexal masses. Pessary in place Other: No abdominal wall hernia or abnormality. No abdominopelvic ascites. Musculoskeletal: Osseous structures are diffusely osteopenic. Degenerative changes are seen within the thoracolumbar spine. No acute bone abnormality. IMPRESSION: 1. Interval increase in size and density of a 5.2 cm left adrenal mass. While this may represent an adrenal myelolipoma, additional considerations should include adrenal pheochromocytoma which can occasionally contain small volume macroscopic fat due to lipid degeneration. Correlation with biochemical markers of adrenal pheochromocytoma is recommended. 2. 5.6 cm nearly completely lipomatous left renal angiomyolipoma. 3. Severe descending and sigmoid diverticulosis. 4. Moderate bilateral pleural effusions. 5. Extensive multi-vessel coronary artery calcification. Aortic Atherosclerosis (ICD10-I70.0). Electronically Signed   By: Worthy Heads M.D.   On: 09/28/2023 02:58   DG Chest Port 1 View Result Date: 09/27/2023 CLINICAL DATA:  Short of breath, atrial fibrillation EXAM: PORTABLE CHEST 1 VIEW COMPARISON:  09/04/2023 FINDINGS: Single frontal view of the chest demonstrates an enlarged cardiac silhouette. Bibasilar veiling opacities, left greater than right, consistent with consolidation and effusions. No pneumothorax. No acute bony abnormalities. IMPRESSION: 1. Findings consistent with congestive heart failure, with bibasilar consolidation and effusions. Electronically Signed   By: Bobbye Burrow M.D.   On: 09/27/2023 20:09     Assessment and Plan:  ASSESSMENT:   Acute  heart failure with mildly reduced LVEF, newly discovered. Apical hypertrophic cardiomyopathy based on echocardiography. Persistent atrial fibrillation. Iatrogenic hyperthyroidism. Hypertension. History of falls  PLAN: Acute heart failure with mildly reduced LVEF Apical hypertrophic cardiomyopathy, based on echocardiography Newly discovered Symptoms and laboratory findings suggestive of clinical CHF exacerbation BNP 824. Chest x-ray illustrates Vascular congestion. High sensitive troponins 17 followed by 19, suggestive of demand ischemia Echocardiogram notes mildly reduced LVEF at 45-50% with apical regional wall motion abnormalities Net IO Since Admission: -450 mL [09/29/23 1135] Strict I's and O's and daily weights. Home medications include: Carvedilol  6.25 mg p.o. twice daily, lisinopril  40 mg p.o. daily Will start carvedilol  3.125 mg p.o. twice daily Continue Lasix  20 mg IV push daily, for two doses and re-evaluate (appears euvolemic). Hold off ACE inhibitors, may consider initiation of Arni closer to discharge depending on renal function and blood pressures.  Last dose of lisinopril  per daughter Friday, September 27, 2023. Uptitrate GDMT as hemodynamics and laboratory values allow.  Persistent atrial fibrillation History of cardioversion 2018 Did not tolerate calcium  channel blockers and therefore currently on beta-blocker therapy Will continue carvedilol  for rate control strategy. Rhythm control: N/A. Thromboembolic prophylaxis: Eliquis , primary team currently holding anticoagulation to rule out GI losses.  Will defer reinitiation to primary team  Iatrogenic hyperthyroidism: On Synthroid  at home TSH is 0.306 and free T4 elevated at 1.38  HTN: On lisinopril  at home. Blood pressures are soft. Focus on diuresis and then up titration of GDMT  Hx of falls: Reemphasized importance of fall preventions.   Risk Assessment/Risk Scores:        New [place]  Heart Association (NYHA)  Functional Class NYHA Class III  CHA2DS2-VASc Score = 6   This indicates a 9.7% annual risk of stroke.  The patient's score is based upon: CHF History: 1 HTN History: 1 Diabetes History: 0 Stroke History: 0 Vascular Disease History: 1 Age Score: 2 Gender Score: 1       For questions or updates, please contact [facility] Please consult [email or web address] for contact info under   Signed, Olinda Bertrand, DO, [facility]  [facility]  [address] [place], [place] [address] Pager: [phone] Office: [phone] 09/29/2023 11:35 AM

## 2023-09-29 NOTE — Progress Notes (Signed)
 PROGRESS NOTE  [name] LKG:[id number] DOB: 04-27-1928 DOA: 09/27/2023 PCP: [name], MD  HPI/Recap of past 24 hours: [name] is a 89 y.o. female with medical history significant of chronic A-fib, adrenal myolipoma/renal angiomyolipoma, apical variant hypertrophic cardiomyopathy, CKD stage IIIa, diverticulosis, gastric ulcer due to H. pylori, hyperlipidemia, colon polyp, hypertension, hypothyroidism, anemia. Pt was seen by PCP PTA for SOB for the past 1 week, and sent to the ED for further evaluation due to concern for heart failure and anemia/upper GI bleed on Eliquis . Patient not compliant with Lasix  due to nausea. Daughter concerned that patient's appetite has been very poor for the past few months and reported unintentional weight loss of about 10 pounds. In the ED, VSS, satting 100% on room air.  Labs showed hemoglobin 8.7 (previously 9.3 on 09/04/2023 and 10.8 on 01/21/2023). CXR consistent with CHF with noted bibasilar consolidation and effusions.TRH called to admit.    Today, patient denies any new complaints.  An extensive discussion with her daughter and granddaughter at bedside.     Assessment/Plan: Principal Problem:   Acute exacerbation of CHF (congestive heart failure) (HCC) Active Problems:   Hypothyroidism   HYPERLIPIDEMIA   Normocytic anemia   Unintentional weight loss   Acute heart failure with mildly reduced ejection fraction (HFmrEF, 41-49%) (HCC)   Long term (current) use of anticoagulants   Iatrogenic hyperthyroidism   Personal history of fall   Benign hypertension   Longstanding persistent atrial fibrillation (HCC)   Acute systolic CHF exacerbation HFrEF Currently on room air BNP 824 Troponin minimally elevated with flat trend Chest x-ray with noted bibasilar consolidations/effusions, consistent with CHF Echo done showed EF of 40 to 45%, regional wall motion abnormality, severe asymmetric left ventricular hypertrophy of the apical  segments Cardiology consult, appreciate recs Continue diuresis with IV Lasix , start PTA Coreg  at a lower dose, continue to hold lisinopril  for possible ARB upon discharge and pending BP Monitor intake and output, daily weights, renal function   Acute on chronic normocytic anemia Iron deficiency anemia Denies any obvious signs of bleeding Hemoglobin 8.7 (previously 9.3 on 09/04/2023 and 10.8 on 01/21/2023) FOBT pending Anemia panel showed iron 20, sats 6, vitamin B12-->575 Give 1 dose of IV iron Continue oral iron supplementation   Nausea, loss of appetite, unintentional weight loss ??  Unlikely adrenal pheochromocytoma CT abdomen pelvis showed possible known adrenal myelolipoma, additional consideration should include adrenal pheochromocytoma Plasma and urine metanephrines/catecholamines ordered, to rule out pheochromocytoma History of gastric ulcer due to H. Pylori, last EGD was 10 years ago. Last colonoscopy was over 16 years ago.  Continue PPI and antiemetic as needed  Hypertension Start PTA Coreg  at a lower dose, hold lisinopril  as BP soft   CKD stage IIIa Creatinine rising while on diuresis Daily BMP   Chronic A-fib Currently rate controlled Start PTA Coreg  at a lower dose Restart Eliquis , new dose changed to 2.5 mg BID   Hypothyroidism TSH is 0.306, free t4-->1.38 Discussed with daughter, PCP already adjusting dose (takes it only Monday through Friday) Hold Synthroid  for now     Estimated body mass index is 25.48 kg/m as calculated from the following:   Height as of this encounter: 4\' 10"  (1.473 m).   Weight as of this encounter: 55.3 kg.     Code Status: DNR  Family Communication: Daughter at bedside  Disposition Plan: Status is: Inpatient  The patient will require care spanning > 2 midnights and should be moved to inpatient because:  Level of care      Consultants: Cardiology  Procedures: None  Antimicrobials: None  DVT  prophylaxis: Eliquis    Objective: Vitals:   09/28/23 1304 09/28/23 1939 09/29/23 0441 09/29/23 1247  BP: (!) 113/52 (!) 106/59 (!) 107/56 (!) 118/54  Pulse: 68 77 86 87  Resp: 20   16  Temp: (!) 97.1 F (36.2 C) 97.7 F (36.5 C) 97.6 F (36.4 C) (!) 97.5 F (36.4 C)  TempSrc:  Oral Oral Oral  SpO2: 99% 95% 97% 98%  Weight:   55.3 kg   Height:        Intake/Output Summary (Last 24 hours) at 09/29/2023 1403 Last data filed at 09/29/2023 0450 Gross per 24 hour  Intake 480 ml  Output 550 ml  Net -70 ml   Filed Weights   09/27/23 2138 09/29/23 0441  Weight: 53.1 kg 55.3 kg    Exam: General: NAD  Cardiovascular: S1, S2 present Respiratory: CTAB Abdomen: Soft, nontender, nondistended, bowel sounds present Musculoskeletal: No bilateral pedal edema noted Skin: Normal Psychiatry: Normal mood     Data Reviewed: CBC: Recent Labs  Lab 09/27/23 1927 09/28/23 0538 09/29/23 0549  WBC 6.6 5.5 5.3  NEUTROABS 4.1  --  3.0  HGB 8.7* 8.3* 8.5*  HCT 31.6* 29.3* 30.4*  MCV 88.3 86.7 88.9  PLT 327 275 293   Basic Metabolic Panel: Recent Labs  Lab 09/27/23 1927 09/28/23 0538 09/29/23 0549  NA 135 140 140  K 3.8 3.9 3.8  CL 104 105 104  CO2 22 23 27   GLUCOSE 110* 98 99  BUN 22 22 26*  CREATININE 1.09* 1.06* 1.49*  CALCIUM  9.1 9.3 9.1   GFR: Estimated Creatinine Clearance: 17.4 mL/min (A) (by C-G formula based on SCr of 1.49 mg/dL (H)). Liver Function Tests: Recent Labs  Lab 09/27/23 1927  AST 14*  ALT 8  ALKPHOS 53  BILITOT 0.6  PROT 6.6  ALBUMIN 3.6   No results for input(s): "LIPASE", "AMYLASE" in the last 168 hours. No results for input(s): "AMMONIA" in the last 168 hours. Coagulation Profile: No results for input(s): "INR", "PROTIME" in the last 168 hours. Cardiac Enzymes: No results for input(s): "CKTOTAL", "CKMB", "CKMBINDEX", "TROPONINI" in the last 168 hours. BNP (last 3 results) Recent Labs    09/06/23 1424  PROBNP 1,482.0*   HbA1C: No  results for input(s): "HGBA1C" in the last 72 hours. CBG: No results for input(s): "GLUCAP" in the last 168 hours. Lipid Profile: No results for input(s): "CHOL", "HDL", "LDLCALC", "TRIG", "CHOLHDL", "LDLDIRECT" in the last 72 hours. Thyroid  Function Tests: Recent Labs    09/28/23 1349  TSH 0.306*  FREET4 1.38*   Anemia Panel: Recent Labs    09/28/23 0538  VITAMINB12 575  FOLATE 10.3  FERRITIN 17  TIBC 367  IRON 20*  RETICCTPCT 1.6   Urine analysis:    Component Value Date/Time   COLORURINE YELLOW 11/08/2013 2123   APPEARANCEUR CLEAR 11/08/2013 2123   LABSPEC 1.008 11/08/2013 2123   PHURINE 6.5 11/08/2013 2123   GLUCOSEU NEGATIVE 11/08/2013 2123   HGBUR NEGATIVE 11/08/2013 2123   BILIRUBINUR NEGATIVE 11/08/2013 2123   KETONESUR NEGATIVE 11/08/2013 2123   PROTEINUR NEGATIVE 11/08/2013 2123   UROBILINOGEN 0.2 11/08/2013 2123   NITRITE NEGATIVE 11/08/2013 2123   LEUKOCYTESUR TRACE (A) 11/08/2013 2123   Sepsis Labs: @LABRCNTIP (procalcitonin:4,lacticidven:4)  )No results found for this or any previous visit (from the past 240 hours).    Studies: No results found.   Scheduled Meds:  apixaban   2.5 mg Oral BID   carvedilol   3.125 mg Oral BID WC   furosemide   20 mg Intravenous Q12H   pantoprazole   40 mg Oral Daily    Continuous Infusions:   LOS: 1 day     Johnryan Sao J Channie Bostick, MD [facility]  If 7PM-7AM, please contact night-coverage [email or web address] 09/29/2023, 2:03 PM

## 2023-09-30 ENCOUNTER — Inpatient Hospital Stay (HOSPITAL_COMMUNITY)

## 2023-09-30 DIAGNOSIS — I5023 Acute on chronic systolic (congestive) heart failure: Secondary | ICD-10-CM | POA: Diagnosis not present

## 2023-09-30 DIAGNOSIS — I5021 Acute systolic (congestive) heart failure: Secondary | ICD-10-CM

## 2023-09-30 LAB — CBC WITH DIFFERENTIAL/PLATELET
Abs Immature Granulocytes: 0.03 10*3/uL (ref 0.00–0.07)
Basophils Absolute: 0 10*3/uL (ref 0.0–0.1)
Basophils Relative: 1 %
Eosinophils Absolute: 0.1 10*3/uL (ref 0.0–0.5)
Eosinophils Relative: 1 %
HCT: 27.9 % — ABNORMAL LOW (ref 36.0–46.0)
Hemoglobin: 8.3 g/dL — ABNORMAL LOW (ref 12.0–15.0)
Immature Granulocytes: 1 %
Lymphocytes Relative: 36 %
Lymphs Abs: 2.1 10*3/uL (ref 0.7–4.0)
MCH: 24.6 pg — ABNORMAL LOW (ref 26.0–34.0)
MCHC: 29.7 g/dL — ABNORMAL LOW (ref 30.0–36.0)
MCV: 82.8 fL (ref 80.0–100.0)
Monocytes Absolute: 0.6 10*3/uL (ref 0.1–1.0)
Monocytes Relative: 10 %
Neutro Abs: 3.1 10*3/uL (ref 1.7–7.7)
Neutrophils Relative %: 51 %
Platelets: 283 10*3/uL (ref 150–400)
RBC: 3.37 MIL/uL — ABNORMAL LOW (ref 3.87–5.11)
RDW: 15.3 % (ref 11.5–15.5)
WBC: 6 10*3/uL (ref 4.0–10.5)
nRBC: 0 % (ref 0.0–0.2)

## 2023-09-30 LAB — BASIC METABOLIC PANEL WITH GFR
Anion gap: 11 (ref 5–15)
BUN: 30 mg/dL — ABNORMAL HIGH (ref 8–23)
CO2: 23 mmol/L (ref 22–32)
Calcium: 9 mg/dL (ref 8.9–10.3)
Chloride: 104 mmol/L (ref 98–111)
Creatinine, Ser: 1.66 mg/dL — ABNORMAL HIGH (ref 0.44–1.00)
GFR, Estimated: 29 mL/min — ABNORMAL LOW (ref >60)
Glucose, Bld: 95 mg/dL (ref 70–99)
Potassium: 3.5 mmol/L (ref 3.5–5.1)
Sodium: 138 mmol/L (ref 135–145)

## 2023-09-30 LAB — BRAIN NATRIURETIC PEPTIDE: B Natriuretic Peptide: 575.4 pg/mL — ABNORMAL HIGH (ref 0.0–100.0)

## 2023-09-30 MED ORDER — ENSURE ENLIVE PO LIQD
237.0000 mL | Freq: Two times a day (BID) | ORAL | Status: DC
  Administered 2023-10-01 – 2023-10-02 (×3): 237 mL via ORAL

## 2023-09-30 NOTE — Progress Notes (Signed)
 Heart Failure Navigator Progress Note  Assessed for Heart & Vascular TOC clinic readiness.  Patient does not meet criteria due to has a scheduled [facility] appointment on 10/10/2023. .   Navigator will sign off at this time.   Randie Bustle, BSN, Scientist, clinical (histocompatibility and immunogenetics) Only

## 2023-09-30 NOTE — TOC Progression Note (Signed)
 Transition of Care [facility]) - Progression Note   Patient Details  Name: [name] MRN: [id number] Date of Birth: 1927-09-04  [facility] CM/SW Contact  Zenon Hilda, LCSW Phone Number: 09/30/2023, 2:38 PM  Clinical Narrative: PT evaluation recommended HH. CSW met with patient and daughter, Raynold Camara, to discuss recommendations. Daughter agreeable to patient being set up with [facility]. CSW discussed HH agencies with daughter and [facility] was chosen. CSW made [facility] referral to Cindie with [facility], which was accepted. HH orders requested.  Expected Discharge Plan: Home w Home Health Services Barriers to Discharge: Continued Medical Work up  Expected Discharge Plan and Services In-house Referral: Clinical Social Work Discharge Planning Services: CM Consult Post Acute Care Choice: Home Health Living arrangements for the past 2 months: Single Family Home             DME Arranged: N/A DME Agency: NA HH Arranged: PT, OT HH Agency[facility]el manager [facility] Date HH Agency Contacted: 09/30/23 Time HH Agency Contacted: 1433 Representative spoke with at [facility] Agency: Cindie  Social Determinants of Health (SDOH) Interventions SDOH Screenings   Food Insecurity: No Food Insecurity (09/28/2023)  Housing: Low Risk  (09/28/2023)  Transportation Needs: No Transportation Needs (09/28/2023)  Utilities: Not At Risk (09/28/2023)  Depression (PHQ2-9): Medium Risk (01/21/2023)  Social Connections: Socially Isolated (09/28/2023)  Tobacco Use: Medium Risk (09/27/2023)   Readmission Risk Interventions     No data to display

## 2023-09-30 NOTE — Progress Notes (Signed)
 PROGRESS NOTE  [name]:[id number] DOB: 08-29-1927 DOA: 09/27/2023 PCP: [name], MD  HPI/Recap of past 24 hours: [name] is a 89 y.o. female with medical history significant of chronic A-fib, adrenal myolipoma/renal angiomyolipoma, apical variant hypertrophic cardiomyopathy, CKD stage IIIa, diverticulosis, gastric ulcer due to H. pylori, hyperlipidemia, colon polyp, hypertension, hypothyroidism, anemia. Pt was seen by PCP PTA for SOB for the past 1 week, and sent to the ED for further evaluation due to concern for heart failure and anemia/upper GI bleed on Eliquis . Patient not compliant with Lasix  due to nausea. Daughter concerned that patient's appetite has been very poor for the past few months and reported unintentional weight loss of about 10 pounds. In the ED, VSS, satting 100% on room air.  Labs showed hemoglobin 8.7 (previously 9.3 on 09/04/2023 and 10.8 on 01/21/2023). CXR consistent with CHF with noted bibasilar consolidation and effusions.TRH called to admit.    Today, pt denies any new complaints. Cr rose to 1.66     Assessment/Plan: Principal Problem:   Acute exacerbation of CHF (congestive heart failure) (HCC) Active Problems:   Hypothyroidism   HYPERLIPIDEMIA   Normocytic anemia   Unintentional weight loss   Acute heart failure with mildly reduced ejection fraction (HFmrEF, 41-49%) (HCC)   Long term (current) use of anticoagulants   Iatrogenic hyperthyroidism   Personal history of fall   Benign hypertension   Longstanding persistent atrial fibrillation (HCC)   Acute systolic CHF exacerbation HFrEF Currently on room air BNP 824-->575 Troponin minimally elevated with flat trend Chest x-ray with noted bibasilar consolidations/effusions, consistent with CHF Echo done showed EF of 40 to 45%, regional wall motion abnormality, severe asymmetric left ventricular hypertrophy of the apical segments Cardiology consult, appreciate recs Held IV Lasix ,  start PTA Coreg  at a lower dose, continue to hold lisinopril  for possible ARB upon discharge and pending BP Monitor intake and output, daily weights, renal function   Acute on chronic normocytic anemia Iron deficiency anemia Denies any obvious signs of bleeding Hemoglobin 8.7 (previously 9.3 on 09/04/2023 and 10.8 on 01/21/2023) Anemia panel showed iron 20, sats 6, vitamin B12-->575 Gave 1 dose of IV iron Continue oral iron supplementation   Nausea, loss of appetite, unintentional weight loss ??  Unlikely adrenal pheochromocytoma CT abdomen pelvis showed possible known adrenal myelolipoma, additional consideration should include adrenal pheochromocytoma Plasma and urine metanephrines/catecholamines ordered, to rule out pheochromocytoma History of gastric ulcer due to H. Pylori, last EGD was 10 years ago. Last colonoscopy was over 16 years ago.  Continue PPI and antiemetic as needed  Hypertension Start PTA Coreg  at a lower dose, hold lisinopril  as BP soft   AKI on CKD stage IIIa Creatinine rising while on diuresis Held lasix  Daily BMP   Chronic A-fib Currently rate controlled Start PTA Coreg  at a lower dose Restart Eliquis , new dose changed to 2.5 mg BID   Hypothyroidism TSH is 0.306, free t4-->1.38 Discussed with daughter, PCP already adjusting dose (takes it only Monday through Friday) Hold Synthroid  for now     Estimated body mass index is 23.74 kg/m as calculated from the following:   Height as of this encounter: 4\' 10"  (1.473 m).   Weight as of this encounter: 51.5 kg.     Code Status: DNR  Family Communication: Daughter at bedside on 4/27  Disposition Plan: Status is: Inpatient  The patient will require care spanning > 2 midnights and should be moved to inpatient because: Level of care  Consultants: Cardiology  Procedures: None  Antimicrobials: None  DVT prophylaxis: Eliquis    Objective: Vitals:   09/30/23 0350 09/30/23 0432 09/30/23 0500  09/30/23 1236  BP: (!) 103/58   (!) 105/57  Pulse: 89   71  Resp: 17   16  Temp: 97.6 F (36.4 C)   (!) 97.4 F (36.3 C)  TempSrc: Oral   Oral  SpO2: 96%   99%  Weight:  55.3 kg 51.5 kg   Height:        Intake/Output Summary (Last 24 hours) at 09/30/2023 1549 Last data filed at 09/30/2023 1300 Gross per 24 hour  Intake 100 ml  Output 350 ml  Net -250 ml   Filed Weights   09/29/23 0441 09/30/23 0432 09/30/23 0500  Weight: 55.3 kg 55.3 kg 51.5 kg    Exam: General: NAD  Cardiovascular: S1, S2 present Respiratory: CTAB Abdomen: Soft, nontender, nondistended, bowel sounds present Musculoskeletal: No bilateral pedal edema noted Skin: Normal Psychiatry: Normal mood     Data Reviewed: CBC: Recent Labs  Lab 09/27/23 1927 09/28/23 0538 09/29/23 0549 09/30/23 0446  WBC 6.6 5.5 5.3 6.0  NEUTROABS 4.1  --  3.0 3.1  HGB 8.7* 8.3* 8.5* 8.3*  HCT 31.6* 29.3* 30.4* 27.9*  MCV 88.3 86.7 88.9 82.8  PLT 327 275 293 283   Basic Metabolic Panel: Recent Labs  Lab 09/27/23 1927 09/28/23 0538 09/29/23 0549 09/30/23 0446  NA 135 140 140 138  K 3.8 3.9 3.8 3.5  CL 104 105 104 104  CO2 22 23 27 23   GLUCOSE 110* 98 99 95  BUN 22 22 26* 30*  CREATININE 1.09* 1.06* 1.49* 1.66*  CALCIUM  9.1 9.3 9.1 9.0   GFR: Estimated Creatinine Clearance: 15.1 mL/min (A) (by C-G formula based on SCr of 1.66 mg/dL (H)). Liver Function Tests: Recent Labs  Lab 09/27/23 1927  AST 14*  ALT 8  ALKPHOS 53  BILITOT 0.6  PROT 6.6  ALBUMIN 3.6   No results for input(s): "LIPASE", "AMYLASE" in the last 168 hours. No results for input(s): "AMMONIA" in the last 168 hours. Coagulation Profile: No results for input(s): "INR", "PROTIME" in the last 168 hours. Cardiac Enzymes: No results for input(s): "CKTOTAL", "CKMB", "CKMBINDEX", "TROPONINI" in the last 168 hours. BNP (last 3 results) Recent Labs    09/06/23 1424  PROBNP 1,482.0*   HbA1C: No results for input(s): "HGBA1C" in the last 72  hours. CBG: No results for input(s): "GLUCAP" in the last 168 hours. Lipid Profile: No results for input(s): "CHOL", "HDL", "LDLCALC", "TRIG", "CHOLHDL", "LDLDIRECT" in the last 72 hours. Thyroid  Function Tests: Recent Labs    09/28/23 1349  TSH 0.306*  FREET4 1.38*   Anemia Panel: Recent Labs    09/28/23 0538  VITAMINB12 575  FOLATE 10.3  FERRITIN 17  TIBC 367  IRON 20*  RETICCTPCT 1.6   Urine analysis:    Component Value Date/Time   COLORURINE YELLOW 11/08/2013 2123   APPEARANCEUR CLEAR 11/08/2013 2123   LABSPEC 1.008 11/08/2013 2123   PHURINE 6.5 11/08/2013 2123   GLUCOSEU NEGATIVE 11/08/2013 2123   HGBUR NEGATIVE 11/08/2013 2123   BILIRUBINUR NEGATIVE 11/08/2013 2123   KETONESUR NEGATIVE 11/08/2013 2123   PROTEINUR NEGATIVE 11/08/2013 2123   UROBILINOGEN 0.2 11/08/2013 2123   NITRITE NEGATIVE 11/08/2013 2123   LEUKOCYTESUR TRACE (A) 11/08/2013 2123   Sepsis Labs: @LABRCNTIP (procalcitonin:4,lacticidven:4)  )No results found for this or any previous visit (from the past 240 hours).    Studies: No results found.  Scheduled Meds:  apixaban   2.5 mg Oral BID   carvedilol   3.125 mg Oral BID WC   pantoprazole   40 mg Oral Daily    Continuous Infusions:   LOS: 2 days     Cephas Revard J Trentyn Boisclair, MD [facility]  If 7PM-7AM, please contact night-coverage [email or web address] 09/30/2023, 3:49 PM

## 2023-09-30 NOTE — Progress Notes (Signed)
 Progress Note  Patient Name: [name] Date of Encounter: 09/30/2023  [facility] Cardiologist: Dr. Nolan Battle  Subjective   Admitted with shortness of breath which has improved Denies any chest pain   Inpatient Medications    Scheduled Meds:  apixaban   2.5 mg Oral BID   carvedilol   3.125 mg Oral BID WC   pantoprazole   40 mg Oral Daily   Continuous Infusions:  PRN Meds: acetaminophen  **OR** acetaminophen , ondansetron    Vital Signs    Vitals:   09/30/23 0117 09/30/23 0350 09/30/23 0432 09/30/23 0500  BP: (!) 114/57 (!) 103/58    Pulse:  89    Resp:  17    Temp:  97.6 F (36.4 C)    TempSrc:  Oral    SpO2:  96%    Weight:   55.3 kg 51.5 kg  Height:        Intake/Output Summary (Last 24 hours) at 09/30/2023 1057 Last data filed at 09/30/2023 0859 Gross per 24 hour  Intake 50 ml  Output 350 ml  Net -300 ml      09/30/2023    5:00 AM 09/30/2023    4:32 AM 09/29/2023    4:41 AM  Last 3 Weights  Weight (lbs) 113 lb 9.6 oz 121 lb 14.6 oz 121 lb 14.6 oz  Weight (kg) 51.529 kg 55.3 kg 55.3 kg      Telemetry    Currently not on tele- Personally Reviewed  ECG    No new EKG to review- Personally Reviewed  Physical Exam   GEN: No acute distress.   Neck: No JVD Cardiac: RRR, no murmurs, rubs, or gallops.  Respiratory: few wheezes at left base GI: Soft, nontender, non-distended  MS: No edema; No deformity. Neuro:  Nonfocal  Psych: Normal affect   Labs    High Sensitivity Troponin:   Recent Labs  Lab 09/28/23 1349 09/28/23 1555  TROPONINIHS 17 19*      Chemistry Recent Labs  Lab 09/27/23 1927 09/28/23 0538 09/29/23 0549 09/30/23 0446  NA 135 140 140 138  K 3.8 3.9 3.8 3.5  CL 104 105 104 104  CO2 22 23 27 23   GLUCOSE 110* 98 99 95  BUN 22 22 26* 30*  CREATININE 1.09* 1.06* 1.49* 1.66*  CALCIUM  9.1 9.3 9.1 9.0  PROT 6.6  --   --   --   ALBUMIN 3.6  --   --   --   AST 14*  --   --   --   ALT 8  --   --   --   ALKPHOS 53  --   --    --   BILITOT 0.6  --   --   --   GFRNONAA 47* 49* 33* 29*  ANIONGAP 9 12 9 11      Hematology Recent Labs  Lab 09/28/23 0538 09/29/23 0549 09/30/23 0446  WBC 5.5 5.3 6.0  RBC 3.38*  3.36* 3.42* 3.37*  HGB 8.3* 8.5* 8.3*  HCT 29.3* 30.4* 27.9*  MCV 86.7 88.9 82.8  MCH 24.6* 24.9* 24.6*  MCHC 28.3* 28.0* 29.7*  RDW 15.1 15.2 15.3  PLT 275 293 283    BNP Recent Labs  Lab 09/28/23 0538  BNP 824.2*     DDimer No results for input(s): "DDIMER" in the last 168 hours.   CHA2DS2-VASc Score = 6   This indicates a 9.7% annual risk of stroke. The patient's score is based upon: CHF History: 1 HTN History:  1 Diabetes History: 0 Stroke History: 0 Vascular Disease History: 1 Age Score: 2 Gender Score: 1   Radiology    No results found.  Patient Profile     89 y.o. female  with a hx of adrenal myolipoma/renal angiomyolipoma, persistent atrial fibrillation, apical variant hypertrophic cardiomyopathy, hypertension, hyperlipidemia, chronic kidney disease, gastric ulcer due to H. pylori, hiatal hernia, GERD, and anemia.  who is being seen for the evaluation of congestive heart failure at the request of Veronica Gordon, MD.   Assessment & Plan    Acute heart failure with mildly reduced LVEF, newly discovered. Apical hypertrophic cardiomyopathy based on echo Persistent atrial fibrillation. Iatrogenic hyperthyroidism. Hypertension. History of falls   PLAN: Acute heart failure with mildly reduced LVEF Apical hypertrophic cardiomyopathy, by echo this is admit Newly discovered Symptoms and laboratory findings suggestive of clinical CHF exacerbation BNP 824. Chest x-ray illustrates Vascular congestion. High sensitive troponins 17 followed by 19, suggestive of demand ischemia Echocardiogram notes mildly reduced LVEF at 45-50% with apical regional wall motion abnormalities Net IO Since Admission: -750 cc  Serum creatinine 1.06>>1.66, potassium 3.5 on Lasix  20 mg IV  twice daily x 2 doses Home medications include: Carvedilol  6.25 mg p.o. twice daily, lisinopril  40 mg p.o. daily (Last dose of lisinopril  per daughter Friday, September 27, 2023). GDMT limited by soft BP and AKI >>uptitrate as hemodynamics and laboratory values allow. BP soft at 103/58 mmHg today Continue carvedilol  3.125 mg twice daily No ACE/ARNI/ARB/MRA at this time given soft BP Avoid SGLT2i in the setting of significantly advanced age and increased risk of UTI repeat BNP today and Chest xray to help guide diuretic therapy   Persistent atrial fibrillation History of cardioversion 2018 Did not tolerate calcium  channel blockers and therefore currently on beta-blocker therapy Currently on low-dose carvedilol : Heart rate 80 to 100 bpm on telemetry in A-fib Thromboembolic prophylaxis: Continue Eliquis  2.5 mg twice daily (dosed for age >63 and weight < 60 kg)  Iatrogenic hyperthyroidism: On Synthroid  at home TSH is 0.306 and free T4 elevated at 1.38   HTN: On lisinopril  at home. ACE inhibitor held due to soft BP Focus on diuresis and then up titration of GDMT with addition of losartan low-dose at discharge if BP tolerates   Hx of falls: Reemphasized importance of fall preventions.   I spent 35 minutes caring for this patient today face to face, ordering and reviewing labs, reviewing records from 2D echo 09/28/2023, seeing the patient, documenting in the record.  For questions or updates, please contact [facility] Please consult [email or web address] for contact info under        Signed, Gaylyn Keas, MD  09/30/2023, 10:57 AM

## 2023-09-30 NOTE — Evaluation (Signed)
 Physical Therapy Evaluation Patient Details Name: [name] MRN: [id number] DOB: 13-Mar-1928 Today's Date: 09/30/2023  History of Present Illness  89 yo female admitted with acute exac of CHF, weakness, fatigue. Hx of Afib, CKD, cardiomyopathy, fibromyalgia, OA, osteoporosis, anxiety  Clinical Impression  On eval, pt was CGA-Min A for mobility. She presents with general weakness, decreased activity tolerance, and impaired gait/balance. Pt reports fatigue with minimal activity at present time. Daughter was present during session-she has some concerns-encouraged her to speak with MD/RN. Will plan to follow pt acutely. Recommend HHPT f/u.         If plan is discharge home, recommend the following: A little help with walking and/or transfers;A little help with bathing/dressing/bathroom;Assistance with cooking/housework;Assist for transportation;Help with stairs or ramp for entrance   Can travel by private vehicle        Equipment Recommendations None recommended by PT  Recommendations for Other Services       Functional Status Assessment       Precautions / Restrictions Precautions Precautions: Fall Restrictions Weight Bearing Restrictions Per Provider Order: No      Mobility  Bed Mobility Overal bed mobility: Needs Assistance Bed Mobility: Supine to Sit, Sit to Supine     Supine to sit: Min assist, HOB elevated, Used rails Sit to supine: Supervision, HOB elevated, Used rails   General bed mobility comments: Pt uses rails (has one at home). Increased time. Assist for supine>sit.    Transfers Overall transfer level: Needs assistance Equipment used: Rolling walker (2 wheels) Transfers: Sit to/from Stand Sit to Stand: Contact guard assist           General transfer comment: Increased time. Pt tends to pull up on walker.    Ambulation/Gait Ambulation/Gait assistance: Contact guard assist Gait Distance (Feet): 12 Feet (x2) Assistive device: Rolling walker (2  wheels) Gait Pattern/deviations: Step-through pattern, Decreased stride length       General Gait Details: Slow gait speed. Fatigues quickly/easily. Pt politely declined hallway ambulation 2* fatigue.  Stairs            Wheelchair Mobility     Tilt Bed    Modified Rankin (Stroke Patients Only)       Balance Overall balance assessment: Needs assistance, History of Falls         Standing balance support: Bilateral upper extremity supported, During functional activity, Reliant on assistive device for balance Standing balance-Leahy Scale: Fair                               Pertinent Vitals/Pain Pain Assessment Pain Assessment: No/denies pain    Home Living Family/patient expects to be discharged to:: Private residence Living Arrangements: Alone Available Help at Discharge: Family;Available PRN/intermittently Type of Home: House Home Access: Stairs to enter Entrance Stairs-Rails: Right Entrance Stairs-Number of Steps: 3   Home Layout: One level Home Equipment: Agricultural consultant (2 wheels);Adaptive equipment;BSC/3in1;Tub bench      Prior Function Prior Level of Function : Independent/Modified Independent             Mobility Comments: uses RW-houshold ambulation       Extremity/Trunk Assessment   Upper Extremity Assessment Upper Extremity Assessment: Defer to OT evaluation    Lower Extremity Assessment Lower Extremity Assessment: Generalized weakness    Cervical / Trunk Assessment Cervical / Trunk Assessment: Kyphotic  Communication   Communication Communication: Impaired Factors Affecting Communication: Hearing impaired    Cognition  Behavior During Therapy: WFL for tasks assessed/performed   PT - Cognitive impairments: No apparent impairments                         Following commands: Intact       Cueing Cueing Techniques: Verbal cues     General Comments      Exercises     Assessment/Plan    PT  Assessment Patient needs continued PT services  PT Problem List Decreased strength;Decreased activity tolerance;Decreased balance;Decreased mobility       PT Treatment Interventions DME instruction;Gait training;Functional mobility training;Therapeutic activities;Therapeutic exercise;Patient/family education;Balance training    PT Goals (Current goals can be found in the Care Plan section)  Acute Rehab PT Goals Patient Stated Goal: to go home PT Goal Formulation: With patient/family Time For Goal Achievement: 10/14/23 Potential to Achieve Goals: Good    Frequency Min 3X/week     Co-evaluation               AM-PAC PT "6 Clicks" Mobility  Outcome Measure Help needed turning from your back to your side while in a flat bed without using bedrails?: A Little Help needed moving from lying on your back to sitting on the side of a flat bed without using bedrails?: A Little Help needed moving to and from a bed to a chair (including a wheelchair)?: A Little Help needed standing up from a chair using your arms (e.g., wheelchair or bedside chair)?: A Little Help needed to walk in hospital room?: A Little Help needed climbing 3-5 steps with a railing? : A Little 6 Click Score: 18    End of Session   Activity Tolerance: Patient limited by fatigue Patient left: in bed;with call bell/phone within reach;with family/visitor present   PT Visit Diagnosis: Difficulty in walking, not elsewhere classified (R26.2)    Time: 1324-4010 PT Time Calculation (min) (ACUTE ONLY): 23 min   Charges:   PT Evaluation $PT Eval Low Complexity: 1 Low PT Treatments $Gait Training: 8-22 mins PT General Charges $$ ACUTE PT VISIT: 1 Visit           Tanda Falter, PT Acute Rehabilitation  Office: [phone]

## 2023-10-01 ENCOUNTER — Inpatient Hospital Stay (HOSPITAL_COMMUNITY)

## 2023-10-01 ENCOUNTER — Telehealth: Payer: Self-pay | Admitting: Internal Medicine

## 2023-10-01 ENCOUNTER — Ambulatory Visit: Admitting: Nurse Practitioner

## 2023-10-01 DIAGNOSIS — I5023 Acute on chronic systolic (congestive) heart failure: Secondary | ICD-10-CM | POA: Diagnosis not present

## 2023-10-01 LAB — CBC WITH DIFFERENTIAL/PLATELET
Abs Immature Granulocytes: 0.02 10*3/uL (ref 0.00–0.07)
Basophils Absolute: 0 10*3/uL (ref 0.0–0.1)
Basophils Relative: 1 %
Eosinophils Absolute: 0.1 10*3/uL (ref 0.0–0.5)
Eosinophils Relative: 1 %
HCT: 29.2 % — ABNORMAL LOW (ref 36.0–46.0)
Hemoglobin: 8.3 g/dL — ABNORMAL LOW (ref 12.0–15.0)
Immature Granulocytes: 0 %
Lymphocytes Relative: 26 %
Lymphs Abs: 1.6 10*3/uL (ref 0.7–4.0)
MCH: 24.6 pg — ABNORMAL LOW (ref 26.0–34.0)
MCHC: 28.4 g/dL — ABNORMAL LOW (ref 30.0–36.0)
MCV: 86.6 fL (ref 80.0–100.0)
Monocytes Absolute: 0.6 10*3/uL (ref 0.1–1.0)
Monocytes Relative: 9 %
Neutro Abs: 3.9 10*3/uL (ref 1.7–7.7)
Neutrophils Relative %: 63 %
Platelets: 277 10*3/uL (ref 150–400)
RBC: 3.37 MIL/uL — ABNORMAL LOW (ref 3.87–5.11)
RDW: 15.3 % (ref 11.5–15.5)
WBC: 6.2 10*3/uL (ref 4.0–10.5)
nRBC: 0 % (ref 0.0–0.2)

## 2023-10-01 LAB — BASIC METABOLIC PANEL WITH GFR
Anion gap: 11 (ref 5–15)
BUN: 32 mg/dL — ABNORMAL HIGH (ref 8–23)
CO2: 24 mmol/L (ref 22–32)
Calcium: 9 mg/dL (ref 8.9–10.3)
Chloride: 102 mmol/L (ref 98–111)
Creatinine, Ser: 1.84 mg/dL — ABNORMAL HIGH (ref 0.44–1.00)
GFR, Estimated: 25 mL/min — ABNORMAL LOW (ref >60)
Glucose, Bld: 111 mg/dL — ABNORMAL HIGH (ref 70–99)
Potassium: 3.5 mmol/L (ref 3.5–5.1)
Sodium: 137 mmol/L (ref 135–145)

## 2023-10-01 MED ORDER — BISACODYL 10 MG RE SUPP: 10.0000 mg | Freq: Every day | RECTAL | Status: DC | PRN

## 2023-10-01 MED ORDER — SODIUM CHLORIDE 0.9 % IV SOLN: INTRAVENOUS | Status: AC

## 2023-10-01 MED ORDER — POLYETHYLENE GLYCOL 3350 17 G PO PACK
17.0000 g | PACK | Freq: Every day | ORAL | Status: DC
  Administered 2023-10-01 – 2023-10-02 (×2): 17 g via ORAL
  Filled 2023-10-01 (×2): qty 1

## 2023-10-01 MED ORDER — SENNOSIDES-DOCUSATE SODIUM 8.6-50 MG PO TABS
1.0000 | ORAL_TABLET | Freq: Two times a day (BID) | ORAL | Status: DC
  Administered 2023-10-01 – 2023-10-02 (×3): 1 via ORAL
  Filled 2023-10-01 (×3): qty 1

## 2023-10-01 MED ORDER — DOCUSATE SODIUM 50 MG PO CAPS
50.0000 mg | ORAL_CAPSULE | Freq: Once | ORAL | Status: AC
  Administered 2023-10-01: 50 mg via ORAL
  Filled 2023-10-01: qty 1

## 2023-10-01 NOTE — Progress Notes (Addendum)
 PROGRESS NOTE  [name] SWF:[id number] DOB: 1927/11/08 DOA: 09/27/2023 PCP: [name], MD  HPI/Recap of past 24 hours: [name] is a 89 y.o. female with medical history significant of chronic A-fib, adrenal myolipoma/renal angiomyolipoma, apical variant hypertrophic cardiomyopathy, CKD stage IIIa, diverticulosis, gastric ulcer due to H. pylori, hyperlipidemia, colon polyp, hypertension, hypothyroidism, anemia. Pt was seen by PCP PTA for SOB for the past 1 week, and sent to the ED for further evaluation due to concern for heart failure and anemia/upper GI bleed on Eliquis . Patient not compliant with Lasix  due to nausea. Daughter concerned that patient's appetite has been very poor for the past few months and reported unintentional weight loss of about 10 pounds. In the ED, VSS, satting 100% on room air.  Labs showed hemoglobin 8.7 (previously 9.3 on 09/04/2023 and 10.8 on 01/21/2023). CXR consistent with CHF with noted bibasilar consolidation and effusions.TRH called to admit.    Today, patient denies any new complaints.  Creatinine continues to rise.     Assessment/Plan: Principal Problem:   Acute exacerbation of CHF (congestive heart failure) (HCC) Active Problems:   Hypothyroidism   HYPERLIPIDEMIA   Primary hypertension   Normocytic anemia   Unintentional weight loss   Acute heart failure with mildly reduced ejection fraction (HFmrEF, 41-49%) (HCC)   Long term (current) use of anticoagulants   Iatrogenic hyperthyroidism   Personal history of fall   Benign hypertension   Longstanding persistent atrial fibrillation (HCC)   Acute systolic CHF exacerbation HFrEF Currently on room air BNP 824-->575 Troponin minimally elevated with flat trend Chest x-ray with noted bibasilar consolidations/effusions, consistent with CHF Echo done showed EF of 40 to 45%, regional wall motion abnormality, severe asymmetric left ventricular hypertrophy of the apical  segments Cardiology consulted, signed off Held IV Lasix , start PTA Coreg  at a lower dose, continue to hold lisinopril  for possible ARB upon discharge and pending BP Monitor intake and output, daily weights, renal function   Acute on chronic normocytic anemia Iron deficiency anemia Denies any obvious signs of bleeding Hemoglobin 8.7 (previously 9.3 on 09/04/2023 and 10.8 on 01/21/2023) Anemia panel showed iron 20, sats 6, vitamin B12-->575 Gave 1 dose of IV iron Continue oral iron supplementation   Nausea, loss of appetite, unintentional weight loss ??  Unlikely adrenal pheochromocytoma CT abdomen pelvis showed possible known adrenal myelolipoma, additional consideration should include adrenal pheochromocytoma Plasma and urine metanephrines/catecholamines ordered, to rule out pheochromocytoma History of gastric ulcer due to H. Pylori, last EGD was 10 years ago. Last colonoscopy was over 16 years ago.  Continue PPI and antiemetic as needed  Hypertension Start PTA Coreg  at a lower dose, hold lisinopril  as BP soft   AKI on CKD stage IIIa Creatinine continues to rise, possibly from overdiuresis Held lasix  Start very gentle hydration with IVF 50 cc x 10 hours Bladder scan less than 300 cc, no evidence of urinary retention Renal ultrasound pending If continues to worsen, may consult nephrology Daily BMP   Chronic A-fib Currently rate controlled Start PTA Coreg  at a lower dose Restart Eliquis , new dose changed to 2.5 mg BID   Hypothyroidism TSH is 0.306, free t4-->1.38 Discussed with daughter, PCP already adjusting dose (takes it only Monday through Friday) Hold Synthroid  for now  Constipation Bowel regimen     Estimated body mass index is 25.43 kg/m as calculated from the following:   Height as of this encounter: 4\' 10"  (1.473 m).   Weight as of this encounter: 55.2 kg.  Code Status: DNR  Family Communication: Daughter at bedside on 4/27  Disposition Plan: Status is:  Inpatient  The patient will require care spanning > 2 midnights and should be moved to inpatient because: Level of care      Consultants: Cardiology  Procedures: None  Antimicrobials: None  DVT prophylaxis: Eliquis    Objective: Vitals:   09/30/23 1934 10/01/23 0500 10/01/23 0526 10/01/23 1201  BP: (!) 101/57  113/68 (!) 118/58  Pulse: 91  89 94  Resp: 18  16 16   Temp: 97.7 F (36.5 C)  98.7 F (37.1 C) (!) 97.4 F (36.3 C)  TempSrc:    Oral  SpO2: 98%  96% 99%  Weight:  55.2 kg    Height:        Intake/Output Summary (Last 24 hours) at 10/01/2023 1832 Last data filed at 10/01/2023 1831 Gross per 24 hour  Intake 240 ml  Output --  Net 240 ml   Filed Weights   09/30/23 0432 09/30/23 0500 10/01/23 0500  Weight: 55.3 kg 51.5 kg 55.2 kg    Exam: General: NAD  Cardiovascular: S1, S2 present Respiratory: CTAB Abdomen: Soft, nontender, nondistended, bowel sounds present Musculoskeletal: No bilateral pedal edema noted Skin: Normal Psychiatry: Normal mood     Data Reviewed: CBC: Recent Labs  Lab 09/27/23 1927 09/28/23 0538 09/29/23 0549 09/30/23 0446 10/01/23 0459  WBC 6.6 5.5 5.3 6.0 6.2  NEUTROABS 4.1  --  3.0 3.1 3.9  HGB 8.7* 8.3* 8.5* 8.3* 8.3*  HCT 31.6* 29.3* 30.4* 27.9* 29.2*  MCV 88.3 86.7 88.9 82.8 86.6  PLT 327 275 293 283 277   Basic Metabolic Panel: Recent Labs  Lab 09/27/23 1927 09/28/23 0538 09/29/23 0549 09/30/23 0446 10/01/23 0459  NA 135 140 140 138 137  K 3.8 3.9 3.8 3.5 3.5  CL 104 105 104 104 102  CO2 22 23 27 23 24   GLUCOSE 110* 98 99 95 111*  BUN 22 22 26* 30* 32*  CREATININE 1.09* 1.06* 1.49* 1.66* 1.84*  CALCIUM  9.1 9.3 9.1 9.0 9.0   GFR: Estimated Creatinine Clearance: 14.1 mL/min (A) (by C-G formula based on SCr of 1.84 mg/dL (H)). Liver Function Tests: Recent Labs  Lab 09/27/23 1927  AST 14*  ALT 8  ALKPHOS 53  BILITOT 0.6  PROT 6.6  ALBUMIN 3.6   No results for input(s): "LIPASE", "AMYLASE" in  the last 168 hours. No results for input(s): "AMMONIA" in the last 168 hours. Coagulation Profile: No results for input(s): "INR", "PROTIME" in the last 168 hours. Cardiac Enzymes: No results for input(s): "CKTOTAL", "CKMB", "CKMBINDEX", "TROPONINI" in the last 168 hours. BNP (last 3 results) Recent Labs    09/06/23 1424  PROBNP 1,482.0*   HbA1C: No results for input(s): "HGBA1C" in the last 72 hours. CBG: No results for input(s): "GLUCAP" in the last 168 hours. Lipid Profile: No results for input(s): "CHOL", "HDL", "LDLCALC", "TRIG", "CHOLHDL", "LDLDIRECT" in the last 72 hours. Thyroid  Function Tests: No results for input(s): "TSH", "T4TOTAL", "FREET4", "T3FREE", "THYROIDAB" in the last 72 hours.  Anemia Panel: No results for input(s): "VITAMINB12", "FOLATE", "FERRITIN", "TIBC", "IRON", "RETICCTPCT" in the last 72 hours.  Urine analysis:    Component Value Date/Time   COLORURINE YELLOW 11/08/2013 2123   APPEARANCEUR CLEAR 11/08/2013 2123   LABSPEC 1.008 11/08/2013 2123   PHURINE 6.5 11/08/2013 2123   GLUCOSEU NEGATIVE 11/08/2013 2123   HGBUR NEGATIVE 11/08/2013 2123   BILIRUBINUR NEGATIVE 11/08/2013 2123   KETONESUR NEGATIVE 11/08/2013 2123  PROTEINUR NEGATIVE 11/08/2013 2123   UROBILINOGEN 0.2 11/08/2013 2123   NITRITE NEGATIVE 11/08/2013 2123   LEUKOCYTESUR TRACE (A) 11/08/2013 2123   Sepsis Labs: @LABRCNTIP (procalcitonin:4,lacticidven:4)  )No results found for this or any previous visit (from the past 240 hours).    Studies: No results found.   Scheduled Meds:  apixaban   2.5 mg Oral BID   carvedilol   3.125 mg Oral BID WC   feeding supplement  237 mL Oral BID BM   pantoprazole   40 mg Oral Daily   polyethylene glycol  17 g Oral Daily   senna-docusate  1 tablet Oral BID    Continuous Infusions:   LOS: 3 days     Veronica Gordon, MD [facility]  If 7PM-7AM, please contact night-coverage [email or web address] 10/01/2023, 6:32 PM

## 2023-10-01 NOTE — Progress Notes (Addendum)
 Progress Note  Patient Name: [name] Date of Encounter: 10/01/2023  [facility] Cardiologist: Dr. Nolan Battle  Subjective   SOB continues to improve.  No CP.  Wants to go home  Inpatient Medications    Scheduled Meds:  apixaban   2.5 mg Oral BID   carvedilol   3.125 mg Oral BID WC   feeding supplement  237 mL Oral BID BM   pantoprazole   40 mg Oral Daily   polyethylene glycol  17 g Oral Daily   senna-docusate  1 tablet Oral BID   Continuous Infusions:  PRN Meds: acetaminophen  **OR** acetaminophen , bisacodyl, ondansetron    Vital Signs    Vitals:   09/30/23 1934 10/01/23 0500 10/01/23 0526 10/01/23 1201  BP: (!) 101/57  113/68 (!) 118/58  Pulse: 91  89 94  Resp: 18  16 16   Temp: 97.7 F (36.5 C)  98.7 F (37.1 C) (!) 97.4 F (36.3 C)  TempSrc:    Oral  SpO2: 98%  96% 99%  Weight:  55.2 kg    Height:        Intake/Output Summary (Last 24 hours) at 10/01/2023 1429 Last data filed at 10/01/2023 0981 Gross per 24 hour  Intake 240 ml  Output --  Net 240 ml      10/01/2023    5:00 AM 09/30/2023    5:00 AM 09/30/2023    4:32 AM  Last 3 Weights  Weight (lbs) 121 lb 11.1 oz 113 lb 9.6 oz 121 lb 14.6 oz  Weight (kg) 55.2 kg 51.529 kg 55.3 kg      Telemetry    Currently not on tele- Personally Reviewed  ECG    No new EKG to review- Personally Reviewed  Physical Exam   GEN: Well nourished, well developed in no acute distress HEENT: Normal NECK: No JVD; No carotid bruits LYMPHATICS: No lymphadenopathy CARDIAC:RRR, no murmurs, rubs, gallops RESPIRATORY:  Clear to auscultation without rales, wheezing or rhonchi  ABDOMEN: Soft, non-tender, non-distended MUSCULOSKELETAL:  No edema; No deformity  SKIN: Warm and dry NEUROLOGIC:  Alert and oriented x 3 PSYCHIATRIC:  Normal affect  Labs    High Sensitivity Troponin:   Recent Labs  Lab 09/28/23 1349 09/28/23 1555  TROPONINIHS 17 19*      Chemistry Recent Labs  Lab 09/27/23 1927 09/28/23 0538  09/29/23 0549 09/30/23 0446 10/01/23 0459  NA 135   < > 140 138 137  K 3.8   < > 3.8 3.5 3.5  CL 104   < > 104 104 102  CO2 22   < > 27 23 24   GLUCOSE 110*   < > 99 95 111*  BUN 22   < > 26* 30* 32*  CREATININE 1.09*   < > 1.49* 1.66* 1.84*  CALCIUM  9.1   < > 9.1 9.0 9.0  PROT 6.6  --   --   --   --   ALBUMIN 3.6  --   --   --   --   AST 14*  --   --   --   --   ALT 8  --   --   --   --   ALKPHOS 53  --   --   --   --   BILITOT 0.6  --   --   --   --   GFRNONAA 47*   < > 33* 29* 25*  ANIONGAP 9   < > 9 11 11    < > =  values in this interval not displayed.     Hematology Recent Labs  Lab 09/29/23 0549 09/30/23 0446 10/01/23 0459  WBC 5.3 6.0 6.2  RBC 3.42* 3.37* 3.37*  HGB 8.5* 8.3* 8.3*  HCT 30.4* 27.9* 29.2*  MCV 88.9 82.8 86.6  MCH 24.9* 24.6* 24.6*  MCHC 28.0* 29.7* 28.4*  RDW 15.2 15.3 15.3  PLT 293 283 277    BNP Recent Labs  Lab 09/28/23 0538 09/30/23 1328  BNP 824.2* 575.4*     DDimer No results for input(s): "DDIMER" in the last 168 hours.   CHA2DS2-VASc Score = 6   This indicates a 9.7% annual risk of stroke. The patient's score is based upon: CHF History: 1 HTN History: 1 Diabetes History: 0 Stroke History: 0 Vascular Disease History: 1 Age Score: 2 Gender Score: 1   Radiology    DG Chest 2 View Result Date: 09/30/2023 CLINICAL DATA:  Dyspnea, CHF exacerbation EXAM: CHEST - 2 VIEW COMPARISON:  09/27/2023 FINDINGS: Seated frontal and lateral views. Accentuated kyphosis. Small bilateral pleural effusions are present, left greater than right. Mild left basilar compressive atelectasis with resultant retrocardiac opacification. Lungs are otherwise clear. No pneumothorax. Cardiac size is within normal limits. No acute bone abnormality. Osseous structures are age-appropriate. IMPRESSION: 1. Small bilateral pleural effusions, left greater than right. Electronically Signed   By: Worthy Heads M.D.   On: 09/30/2023 19:45    Patient Profile     89  y.o. female  with a hx of adrenal myolipoma/renal angiomyolipoma, persistent atrial fibrillation, apical variant hypertrophic cardiomyopathy, hypertension, hyperlipidemia, chronic kidney disease, gastric ulcer due to H. pylori, hiatal hernia, GERD, and anemia.  who is being seen for the evaluation of congestive heart failure at the request of Veronica Gordon, MD.   Assessment & Plan    Acute heart failure with mildly reduced LVEF, newly discovered. Apical hypertrophic cardiomyopathy based on echo Persistent atrial fibrillation. Iatrogenic hyperthyroidism. Hypertension. History of falls   PLAN: Acute heart failure with mildly reduced LVEF Apical hypertrophic cardiomyopathy, by echo this is admit Newly discovered Symptoms and laboratory findings suggestive of clinical CHF exacerbation BNP 824. Chest x-ray illustrates Vascular congestion. High sensitive troponins 17 followed by 19, suggestive of demand ischemia Echocardiogram notes mildly reduced LVEF at 45-50% with apical regional wall motion abnormalities (severe apical hypertrophy with akinesis) Given advanced age and AKI would not pursue ischemic workup and treat conservatively I&O's  incomplete Serum creatinine 1.06>>1.66>>1.84 despite stopping diuretics yesterday BNP yesterday was 575 but improved from several days ago likely related to her very small pleural effusions noted on chest x-ray yesterday Given bump in serum creatinine would not recommend diuretic therapy today Home medications include: Carvedilol  6.25 mg p.o. twice daily, lisinopril  40 mg p.o. daily (Last dose of lisinopril  per daughter Friday, September 27, 2023). GDMT limited by soft BP and AKI  BP still soft at times Will continue current current dose of 3.125 mg twice daily for now  no ACE/ARNI/ARB/MRA at this time given soft BP Avoid SGLT2i in the setting of significantly advanced age and increased risk of UTI Patient very anxious to go home.  Clinically she  appears euvolemic Recommend restarting Home dose of lasix  20mg  every other day PRN for LE edema, SOB or wt gain > 3lbs in 1 day or 5lbs in 1 week   Persistent atrial fibrillation History of cardioversion 2018 Did not tolerate calcium  channel blockers and therefore currently on beta-blocker therapy Continue carvedilol  3.125 mg twice daily Remains in atrial fibrillation  with good heart rate control Thromboembolic prophylaxis: Continue Eliquis  2.5 mg twice daily (dosed for age >34 and weight < 60 kg)  Iatrogenic hyperthyroidism: On Synthroid  at home TSH is 0.306 and free T4 elevated at 1.38 Per TRH>> holding Synthroid  for now   HTN: On lisinopril  at home. ACE inhibitor held due to soft BP Continue carvedilol  3.125 mg twice daily   Hx of falls: Reemphasized importance of fall preventions.  [facility] will sign off.   Medication Recommendations:  Carvedilol  3.125mg  BID, Eliquis  2.5mg  BID, lasix  20mg  every other day PRN for LE edema/SOB/wt gain > 3lbs in 1 day or 5lbs in 1 week Other recommendations (labs, testing, etc):  BMET in 1 week Follow up as an outpatient:  she has an appt with APP on 10/10/23   I spent 35 minutes caring for this patient today face to face, ordering and reviewing labs, reviewing records from 2D echo 09/28/2023, seeing the patient, documenting in the record.  For questions or updates, please contact [facility] Please consult [email or web address] for contact info under        Signed, Gaylyn Keas, MD  10/01/2023, 2:29 PM

## 2023-10-01 NOTE — Evaluation (Signed)
 Occupational Therapy Evaluation Patient Details Name: [name] MRN: [id number] DOB: [birth year] Today's Date: 10/01/2023   History of Present Illness   89 yr old female admitted with acute exacerbation of CHF, weakness, fatigue. {MH: Afib, CKD, cardiomyopathy, fibromyalgia, OA, osteoporosis, anxiety     Clinical Impressions The pt is currently presenting with the below listed deficits (see OT problem list). As such, she is presenting slightly below her baseline level of functioning for self-care management. At current, she requires SBA to min assist for tasks, including lower body dressing, sit to stand using a RW, and for toileting at bathroom level. During the session, she reported feelings of generalized weakness and decreased endurance. She will benefit from further OT services to maximize her independence with self care tasks and to facilitate her safe return home. Home health OT is recommended.      If plan is discharge home, recommend the following:   Help with stairs or ramp for entrance;Assistance with cooking/housework;Assist for transportation     Functional Status Assessment   Patient has had a recent decline in their functional status and demonstrates the ability to make significant improvements in function in a reasonable and predictable amount of time.     Equipment Recommendations   None recommended by OT     Recommendations for Other Services         Precautions/Restrictions   Restrictions Weight Bearing Restrictions Per Provider Order: No     Mobility Bed Mobility Overal bed mobility: Needs Assistance Bed Mobility: Supine to Sit     Supine to sit: HOB elevated, Used rails, Contact guard Sit to supine: Mod assist   General bed mobility comments: required assist for BLE, for sit to supine    Transfers Overall transfer level: Needs assistance Equipment used: Rolling walker (2 wheels) Transfers: Sit to/from Stand Sit to Stand: Contact  guard assist                  Balance     Sitting balance-Leahy Scale: Good       Standing balance-Leahy Scale: Fair                             ADL either performed or assessed with clinical judgement   ADL Overall ADL's : Needs assistance/impaired Eating/Feeding: Independent;Sitting   Grooming: Contact guard assist;Standing Grooming Details (indicate cue type and reason): She performed hand washing in standing at sink level.         Upper Body Dressing : Set up;Sitting   Lower Body Dressing: Contact guard assist;Sitting/lateral leans   Toilet Transfer: Contact guard assist;Ambulation;Rolling walker (2 wheels);Grab bars Toilet Transfer Details (indicate cue type and reason): She ambulated to and from the bathroom in her room using a RW. Toileting- Clothing Manipulation and Hygiene: Contact guard assist;Sit to/from stand Toileting - Clothing Manipulation Details (indicate cue type and reason): The pt performed toileting tasks at bathroom level.             Vision   Additional Comments: She correctly read the time depicted on the wall clock.            Pertinent Vitals/Pain Pain Assessment Pain Assessment: No/denies pain     Extremity/Trunk Assessment Upper Extremity Assessment Upper Extremity Assessment: Overall WFL for tasks assessed;Right hand dominant   Lower Extremity Assessment Lower Extremity Assessment:  (B LE AROM WFL. Slight generalized weakness, based on observation of functional abilities)  Communication Communication Factors Affecting Communication: Hearing impaired   Cognition Arousal: Alert Behavior During Therapy: WFL for tasks assessed/performed               OT - Cognition Comments: Oriented x4, friendly                 Following commands: Intact                  Home Living Family/patient expects to be discharged to:: Private residence Living Arrangements: Alone. Her daughter is nearby  and stops by almost daily.  Available Help at Discharge: Family;Available PRN/intermittently Type of Home: House Home Access: Stairs to enter [employer] of Steps: 3 Entrance Stairs-Rails: Left;Right Home Layout: One level     Bathroom Shower/Tub: Tub/shower unit         Home Equipment: Agricultural consultant (2 wheels);Shower seat          Prior Functioning/Environment               Mobility Comments: She has been using a RW recently for ambulation. ADLs Comments: She was modified independent to independent with ADLs, she does not drive, and her daughter performs most of the cleaning. She prepared only simple, light meals.    OT Problem List: Decreased strength;Impaired balance (sitting and/or standing)   OT Treatment/Interventions: Self-care/ADL training;Therapeutic exercise;Therapeutic activities;Energy conservation;Patient/family education;DME and/or AE instruction;Balance training      OT Goals(Current goals can be found in the care plan section)   Acute Rehab OT Goals OT Goal Formulation: With patient Time For Goal Achievement: 10/15/23 Potential to Achieve Goals: Good ADL Goals Pt Will Perform Grooming: with modified independence;standing Pt Will Perform Lower Body Dressing: with modified independence;sitting/lateral leans;sit to/from stand Pt Will Transfer to Toilet: with modified independence;ambulating Pt Will Perform Toileting - Clothing Manipulation and hygiene: with modified independence;sit to/from stand   OT Frequency:  Min 2X/week       AM-PAC OT "6 Clicks" Daily Activity     Outcome Measure Help from another person eating meals?: None Help from another person taking care of personal grooming?: None Help from another person toileting, which includes using toliet, bedpan, or urinal?: A Little Help from another person bathing (including washing, rinsing, drying)?: A Little Help from another person to put on and taking off regular upper body  clothing?: None Help from another person to put on and taking off regular lower body clothing?: A Little 6 Click Score: 21   End of Session Equipment Utilized During Treatment: Gait belt Nurse Communication: Mobility status  Activity Tolerance: Patient tolerated treatment well Patient left: in bed;with call bell/phone within reach;with bed alarm set  OT Visit Diagnosis: Muscle weakness (generalized) (M62.81);Unsteadiness on feet (R26.81)                Time: 1610-9604 OT Time Calculation (min): 21 min Charges:  OT General Charges $OT Visit: 1 Visit    Sheralyn Dies, OTR/L 10/01/2023, 1:49 PM

## 2023-10-01 NOTE — TOC Progression Note (Signed)
 Transition of Care [facility]) - Progression Note   Patient Details  Name: [name] MRN: [id number] Date of Birth: February 23, 1928  [facility]) CM/SW Contact  Zenon Hilda, LCSW Phone Number: 10/01/2023, 3:32 PM  Clinical Narrative: HH orders placed by hospitalist. CSW notified Cindie with [facility]. CSW updated patient and daughter.  Expected Discharge Plan: Home w Home Health Services Barriers to Discharge: Continued Medical Work up  Expected Discharge Plan and Services In-house Referral: Clinical Social Work Discharge Planning Services: CM Consult Post Acute Care Choice: Home Health Living arrangements for the past 2 months: Single Family Home          DME Arranged: N/A DME Agency: NA HH Arranged: PT, OT HH AgencyHotel manager Home Health Care Date HH Agency Contacted: 09/30/23 Time HH Agency Contacted: 1433 Representative spoke with at [facility] Agency: Cindie  Social Determinants of Health (SDOH) Interventions SDOH Screenings   Food Insecurity: No Food Insecurity (09/28/2023)  Housing: Low Risk  (09/28/2023)  Transportation Needs: No Transportation Needs (09/28/2023)  Utilities: Not At Risk (09/28/2023)  Depression (PHQ2-9): Medium Risk (01/21/2023)  Social Connections: Socially Isolated (09/28/2023)  Tobacco Use: Medium Risk (09/27/2023)   Readmission Risk Interventions     No data to display

## 2023-10-01 NOTE — Telephone Encounter (Signed)
 Mariah Shines from [place] lab requested that Dr. Donnette Gal' CMA call her back at [phone] regarding the patient.

## 2023-10-01 NOTE — Progress Notes (Signed)
 Physical Therapy Treatment Patient Details Name: [name] MRN: [id number] DOB: 06-08-1927 Today's Date: 10/01/2023   History of Present Illness 89 yo female admitted with acute exac of CHF, weakness, fatigue. Hx of Afib, CKD, cardiomyopathy, fibromyalgia, OA, osteoporosis, anxiety    PT Comments  Pt agreeable to working with therapy. Daughter present during session. Pt with improved activity tolerance on today compared to eval on yesterday. Continue to recommend HHPT f/u.     If plan is discharge home, recommend the following: A little help with walking and/or transfers;A little help with bathing/dressing/bathroom;Assistance with cooking/housework;Assist for transportation;Help with stairs or ramp for entrance   Can travel by private vehicle        Equipment Recommendations  None recommended by PT    Recommendations for Other Services       Precautions / Restrictions Precautions Precautions: Fall Restrictions Weight Bearing Restrictions Per Provider Order: No     Mobility  Bed Mobility Overal bed mobility: Needs Assistance Bed Mobility: Supine to Sit, Sit to Supine     Supine to sit: Supervision, HOB elevated, Used rails Sit to supine: Supervision, HOB elevated, Used rails   General bed mobility comments: no assist required. increased time. pt used rail (has one at home)    Transfers Overall transfer level: Needs assistance Equipment used: Rolling walker (2 wheels) Transfers: Sit to/from Stand Sit to Stand: Supervision           General transfer comment: Increased time. Cues for safety, hand placement    Ambulation/Gait Ambulation/Gait assistance: Supervision Gait Distance (Feet): 60 Feet Assistive device: Rolling walker (2 wheels) Gait Pattern/deviations: Step-through pattern, Decreased stride length       General Gait Details: Improved activity tolerance this session.   Stairs             Wheelchair Mobility     Tilt Bed     Modified Rankin (Stroke Patients Only)       Balance Overall balance assessment: Needs assistance, History of Falls         Standing balance support: Bilateral upper extremity supported, During functional activity, Reliant on assistive device for balance Standing balance-Leahy Scale: Fair                              Hotel manager: No apparent difficulties Factors Affecting Communication: Hearing impaired  Cognition Arousal: Alert Behavior During Therapy: WFL for tasks assessed/performed   PT - Cognitive impairments: No apparent impairments                         Following commands: Intact      Cueing Cueing Techniques: Verbal cues  Exercises      General Comments        Pertinent Vitals/Pain Pain Assessment Pain Assessment: No/denies pain    Home Living                          Prior Function            PT Goals (current goals can now be found in the care plan section) Progress towards PT goals: Progressing toward goals    Frequency    Min 3X/week      PT Plan      Co-evaluation              AM-PAC PT "6 Clicks" Mobility   Outcome Measure  Help needed turning from your back to your side while in a flat bed without using bedrails?: A Little Help needed moving from lying on your back to sitting on the side of a flat bed without using bedrails?: A Little Help needed moving to and from a bed to a chair (including a wheelchair)?: A Little Help needed standing up from a chair using your arms (e.g., wheelchair or bedside chair)?: A Little Help needed to walk in hospital room?: A Little Help needed climbing 3-5 steps with a railing? : A Little 6 Click Score: 18    End of Session Equipment Utilized During Treatment: Gait belt Activity Tolerance: Patient tolerated treatment well Patient left: in bed;with call bell/phone within reach;with family/visitor present;with bed alarm set    PT Visit Diagnosis: Difficulty in walking, not elsewhere classified (R26.2)     Time: 1610-9604 PT Time Calculation (min) (ACUTE ONLY): 10 min  Charges:    $Gait Training: 8-22 mins PT General Charges $$ ACUTE PT VISIT: 1 Visit                        Tanda Falter, PT Acute Rehabilitation  Office: [phone]

## 2023-10-02 ENCOUNTER — Other Ambulatory Visit (HOSPITAL_COMMUNITY): Payer: Self-pay

## 2023-10-02 DIAGNOSIS — R0602 Shortness of breath: Secondary | ICD-10-CM

## 2023-10-02 LAB — BASIC METABOLIC PANEL WITH GFR
Anion gap: 11 (ref 5–15)
BUN: 31 mg/dL — ABNORMAL HIGH (ref 8–23)
CO2: 25 mmol/L (ref 22–32)
Calcium: 8.9 mg/dL (ref 8.9–10.3)
Chloride: 100 mmol/L (ref 98–111)
Creatinine, Ser: 1.33 mg/dL — ABNORMAL HIGH (ref 0.44–1.00)
GFR, Estimated: 37 mL/min — ABNORMAL LOW (ref >60)
Glucose, Bld: 95 mg/dL (ref 70–99)
Potassium: 4 mmol/L (ref 3.5–5.1)
Sodium: 136 mmol/L (ref 135–145)

## 2023-10-02 LAB — CBC WITH DIFFERENTIAL/PLATELET
Abs Immature Granulocytes: 0.02 10*3/uL (ref 0.00–0.07)
Basophils Absolute: 0 10*3/uL (ref 0.0–0.1)
Basophils Relative: 1 %
Eosinophils Absolute: 0.1 10*3/uL (ref 0.0–0.5)
Eosinophils Relative: 1 %
HCT: 27.2 % — ABNORMAL LOW (ref 36.0–46.0)
Hemoglobin: 7.9 g/dL — ABNORMAL LOW (ref 12.0–15.0)
Immature Granulocytes: 0 %
Lymphocytes Relative: 30 %
Lymphs Abs: 1.9 10*3/uL (ref 0.7–4.0)
MCH: 25.2 pg — ABNORMAL LOW (ref 26.0–34.0)
MCHC: 29 g/dL — ABNORMAL LOW (ref 30.0–36.0)
MCV: 86.6 fL (ref 80.0–100.0)
Monocytes Absolute: 0.7 10*3/uL (ref 0.1–1.0)
Monocytes Relative: 11 %
Neutro Abs: 3.6 10*3/uL (ref 1.7–7.7)
Neutrophils Relative %: 57 %
Platelets: 263 10*3/uL (ref 150–400)
RBC: 3.14 MIL/uL — ABNORMAL LOW (ref 3.87–5.11)
RDW: 15.4 % (ref 11.5–15.5)
WBC: 6.3 10*3/uL (ref 4.0–10.5)
nRBC: 0.3 % — ABNORMAL HIGH (ref 0.0–0.2)

## 2023-10-02 LAB — METANEPHRINES, PLASMA
Metanephrine, Free: 35.7 pg/mL (ref 0.0–88.0)
Normetanephrine, Free: 118.5 pg/mL (ref 0.0–297.2)

## 2023-10-02 MED ORDER — CARVEDILOL 3.125 MG PO TABS
3.1250 mg | ORAL_TABLET | Freq: Two times a day (BID) | ORAL | 1 refills | Status: DC
  Filled 2023-10-02: qty 30, 15d supply, fill #0

## 2023-10-02 MED ORDER — TORSEMIDE 10 MG PO TABS
10.0000 mg | ORAL_TABLET | ORAL | 1 refills | Status: DC | PRN
  Filled 2023-10-02: qty 10, 10d supply, fill #0

## 2023-10-02 MED ORDER — FERROUS SULFATE 325 (65 FE) MG PO TBEC
325.0000 mg | DELAYED_RELEASE_TABLET | ORAL | 1 refills | Status: DC
  Filled 2023-10-02: qty 15, 30d supply, fill #0

## 2023-10-02 MED ORDER — APIXABAN 2.5 MG PO TABS
2.5000 mg | ORAL_TABLET | Freq: Two times a day (BID) | ORAL | 1 refills | Status: DC
  Filled 2023-10-02: qty 60, 30d supply, fill #0

## 2023-10-02 MED ORDER — TORSEMIDE 10 MG PO TABS
10.0000 mg | ORAL_TABLET | Freq: Every day | ORAL | Status: DC
  Administered 2023-10-02: 10 mg via ORAL
  Filled 2023-10-02: qty 1

## 2023-10-02 NOTE — Telephone Encounter (Signed)
 Spoke with [name] today.

## 2023-10-02 NOTE — Progress Notes (Signed)
 Discharge medications delivered to patient at bedside [facility]

## 2023-10-02 NOTE — Progress Notes (Signed)
 Occupational Therapy Treatment Patient Details Name: [name] MRN: [id number] DOB: 07/17/1927 Today's Date: 10/02/2023   History of present illness 89 yr old female admitted with acute exacerbation of CHF, weakness, fatigue. PMH: Afib, CKD, cardiomyopathy, fibromyalgia, OA, osteoporosis, anxiety   OT comments  The pt is progressing towards her OT goals. She required SBA for supine to sit and sit to stand using a RW. She required total assist to don her socks seated EOB. She stated she does not normally wear socks at home, rather she wears slide in shoes. She further participated in ambulation out into the hall using a RW. She reported feelings of fatigue and mild shortness of breath with activity, with OT subsequently educating her on pursed lip breathing and therapeutic rest breaks. An educational handout was provided on energy conservation strategies during self-care tasks. Continue OT plan of care.       If plan is discharge home, recommend the following:  Help with stairs or ramp for entrance;Assistance with cooking/housework;Assist for transportation   Equipment Recommendations  None recommended by OT    Recommendations for Other Services      Precautions / Restrictions Restrictions Weight Bearing Restrictions Per Provider Order: No       Mobility Bed Mobility Overal bed mobility: Needs Assistance Bed Mobility: Supine to Sit     Supine to sit: Supervision, HOB elevated, Used rails          Transfers Overall transfer level: Needs assistance Equipment used: Rolling walker (2 wheels) Transfers: Sit to/from Stand Sit to Stand: Supervision                 Balance     Sitting balance-Leahy Scale: Good       Standing balance-Leahy Scale: Fair          ADL either performed or assessed with clinical judgement   ADL          Lower Body Dressing Details (indicate cue type and reason): The pt required total assist to don her socks seated EOB. She  stated she does not normally wear socks at home, rather she wears slide in shoes.                              Communication Communication Communication: No apparent difficulties Factors Affecting Communication: Hearing impaired   Cognition Arousal: Alert Behavior During Therapy: WFL for tasks assessed/performed               OT - Cognition Comments: Oriented x4, friendly                 Following commands: Intact                      Pertinent Vitals/ Pain       Pain Assessment Pain Assessment: No/denies pain   Frequency  Min 2X/week        Progress Toward Goals  OT Goals(current goals can now be found in the care plan section)  Progress towards OT goals: Progressing toward goals  Acute Rehab OT Goals OT Goal Formulation: With patient/family Time For Goal Achievement: 10/15/23 Potential to Achieve Goals: Good  Plan         AM-PAC OT "6 Clicks" Daily Activity     Outcome Measure   Help from another person eating meals?: None Help from another person taking care of personal grooming?: None Help from another person toileting, which  includes using toliet, bedpan, or urinal?: A Little Help from another person bathing (including washing, rinsing, drying)?: A Little Help from another person to put on and taking off regular upper body clothing?: None Help from another person to put on and taking off regular lower body clothing?: A Little 6 Click Score: 21    End of Session Equipment Utilized During Treatment: Rolling walker (2 wheels)  OT Visit Diagnosis: Muscle weakness (generalized) (M62.81);Unsteadiness on feet (R26.81)   Activity Tolerance Patient tolerated treatment well   Patient Left in chair;with call bell/phone within reach;with family/visitor present   Nurse Communication Mobility status        Time: 1191-4782 OT Time Calculation (min): 22 min  Charges: OT General Charges $OT Visit: 1 Visit OT  Treatments $Therapeutic Activity: 8-22 mins     Sheralyn Dies, OTR/L 10/02/2023, 12:24 PM

## 2023-10-02 NOTE — TOC Transition Note (Signed)
 Transition of Care [facility]) - Discharge Note  Patient Details  Name: BENETA [name] MRN: [id number] Date of Birth: 16-Aug-1927  Transition of Care [facility]) CM/SW Contact:  Zenon Hilda, LCSW Phone Number: 10/02/2023, 1:10 PM  Clinical Narrative: Patient to discharge home today. CSW notified Cindie with [facility] of discharge. TOC signing off.  Final next level of care: Home w Home Health Services Barriers to Discharge: Barriers Resolved  Patient Goals and CMS Choice Patient states their goals for this hospitalization and ongoing recovery are:: home CMS Medicare.gov Compare Post Acute Care list provided to:: Patient Choice offered to / list presented to : Patient  ownership interest in [facility].provided to:: Patient   Discharge Plan and Services Additional resources added to the After Visit Summary for   In-house Referral: Clinical Social Work Discharge Planning Services: CM Consult Post Acute Care Choice: Home Health          DME Arranged: N/A DME Agency: NA HH Arranged: PT, OT HH Agency: [facility] Date [facility] Agency Contacted: 09/30/23 Time HH Agency Contacted: 1433 Representative spoke with at [facility] Agency: Cindie  Social Drivers of Health (SDOH) Interventions SDOH Screenings   Food Insecurity: No Food Insecurity (09/28/2023)  Housing: Low Risk  (09/28/2023)  Transportation Needs: No Transportation Needs (09/28/2023)  Utilities: Not At Risk (09/28/2023)  Depression (PHQ2-9): Medium Risk (01/21/2023)  Social Connections: Socially Isolated (09/28/2023)  Tobacco Use: Medium Risk (09/27/2023)   Readmission Risk Interventions     No data to display

## 2023-10-02 NOTE — Discharge Summary (Signed)
 Physician Discharge Summary  [name] EAV:[id number] DOB: September 15, 1927 DOA: 09/27/2023  PCP: [name], MD  Admit date: 09/27/2023 Discharge date: 10/02/2023  Time spent: 40 minutes  Recommendations for Outpatient Follow-up:  Follow outpatient CBC/CMP  Follow volume status/labs outpatient Follow with cardiology outpatient Follow anemia outpatient - supplementing iron  Follow weight loss, poor PO intake outpatient Follow adrenal and renal masses outpatient - pending urine metanephrines Follow hypothyroidism outpatient, needs repeat labs   Discharge Diagnoses:  Principal Problem:   Acute exacerbation of CHF (congestive heart failure) (HCC) Active Problems:   Hypothyroidism   HYPERLIPIDEMIA   Primary hypertension   Normocytic anemia   Unintentional weight loss   Acute heart failure with mildly reduced ejection fraction (HFmrEF, 41-49%) (HCC)   Long term (current) use of anticoagulants   Iatrogenic hyperthyroidism   Personal history of fall   Benign hypertension   Longstanding persistent atrial fibrillation (HCC)   Discharge Condition: stable  Diet recommendation: low sodium   Filed Weights   09/30/23 0500 10/01/23 0500 10/02/23 0500  Weight: 51.5 kg 55.2 kg 52.3 kg    History of present illness:   89 yo with atrial fib on eliquis , adrenal myolipoma/renal angiomyolipoma, apical variant hypertrophic cardiomyopathy and multiple other medical issues sent to the ED from PCP for SOB with concern for HF exacerbation and possible GI bleed.  She's improved overall with diuresis.  Cards was consulted.  Hb remained relatively stable.  Plan for discharge on prn diuretics.  See below and previous notes for additional details.   Hospital Course:  Assessment and Plan:  Acute systolic CHF exacerbation HFrEF Currently on room air BNP 824-->575 Troponin minimally elevated with flat trend Chest x-ray with noted bibasilar consolidations/effusions, consistent with CHF Echo  done showed EF of 40 to 45%, regional wall motion abnormality, severe asymmetric left ventricular hypertrophy of the apical segments Cardiology consulted, signed off - recommending as needed diuretic (will d/c with torsemide  as she notes feeling sick with lasix ), coreg  3.125 mg BID   Acute on chronic normocytic anemia Iron deficiency anemia Denies any obvious signs of bleeding Hb relatively stable Labs c/w iron def S/p IV iron.  Continue every other day iron at discharge Additional workup per PCP based on risks/benefits   Nausea, loss of appetite, unintentional weight loss Follow outpatient with PCP History of gastric ulcer due to H. Pylori, last EGD was 10 years ago. Last colonoscopy was over 16 years ago.  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 CT abdomen pelvis showed possible known adrenal myelolipoma, additional consideration should include adrenal pheochromocytoma Plasma metanephrines wnl and urine metanephrines pending, to rule out pheochromocytoma Follow masses with PCP outpatient  Hypertension Lisinopril  on hold Coreg  at lower dose at discharge   AKI on CKD stage IIIa Creatinine continues to rise, possibly from overdiuresis Renal US  with angiomyolipoma on left Improved today, follow outpatient   Chronic [name] Currently rate controlled Start PTA Coreg  at Carver Murakami lower dose Restart Eliquis , new dose changed to 2.5 mg BID based on age/weight   Hypothyroidism TSH is 0.306 Her dose recently reduced to 5 times weekly, follow repeat labs outpatient with PCP   Constipation Bowel regimen     Procedures: Echo IMPRESSIONS     1. Left ventricular ejection fraction, by estimation, is 40 to 45%. The  left ventricle has mildly decreased function. The left ventricle  demonstrates regional wall motion abnormalities (see scoring  diagram/findings for description). There is severe   asymmetric left ventricular hypertrophy of the apical segment.   2. Right ventricular systolic function is normal. The right ventricular  size is normal.   3. Left atrial size was severely dilated.   4. The mitral valve is normal in structure. No evidence of mitral valve  regurgitation. No evidence of mitral stenosis.   5. The aortic valve is normal in structure. There is moderate  calcification of the aortic valve. There is moderate thickening of the  aortic valve. Aortic valve regurgitation is not visualized. No aortic  stenosis is present.   6. The inferior vena cava is normal in size with greater than 50%  respiratory variability, suggesting right atrial pressure of 3 mmHg.    Consultations: cards  Discharge Exam: Vitals:   10/01/23 1936 10/02/23 0517  BP: (!) 108/51 105/64  Pulse: 80 82  Resp: 16 16  Temp: 97.7 F (36.5 C) 98 F (36.7 C)  SpO2: 96% 97%   Eager to d/c home Did well with therapy, mild DOE Daughter at bedside, discussed d/c plans  General: No acute distress. Cardiovascular: Heart sounds show Collier Bohnet regular rate, and rhythm.  Lungs: diminished, no adventitious lung sounds Abdomen: Soft, nontender, nondistended  Neurological: Alert and oriented 3. Moves all extremities 4 with equal strength. Cranial nerves II through XII grossly intact. Extremities: No clubbing or cyanosis. No edema.  Discharge Instructions   Discharge Instructions     (HEART FAILURE PATIENTS) Call MD:  Anytime you have any of the following symptoms: 1) 3 pound weight gain in 24 hours or 5 pounds in 1 week 2) shortness of breath, with or without Kedrick Mcnamee dry hacking cough 3) swelling in the hands, feet or stomach 4) if you have to sleep on extra pillows at night in order to breathe.   Complete by: As directed    Call MD for:  difficulty breathing, headache or visual disturbances   Complete by: As directed    Call MD for:  extreme fatigue   Complete by: As directed    Call MD for:  hives    Complete by: As directed    Call MD for:  persistant dizziness or light-headedness   Complete by: As directed    Call MD for:  persistant nausea and vomiting   Complete by: As directed    Call MD for:  redness, tenderness, or signs of infection (pain, swelling, redness, odor or green/yellow discharge around incision site)   Complete by: As directed    Call MD for:  severe uncontrolled pain   Complete by: As directed    Call MD for:  temperature >100.4   Complete by: As directed    Diet - low sodium heart healthy   Complete by: As directed    Discharge instructions   Complete by: As directed    You were seen for heart failure.    Your ultrasound of your heart showed Karole Oo weakened pump.  You also had pleural effusions (fluid between the lung and chest wall).  You've improved after diuresis (pulling fluid off).  You were seen by cardiology.  We'll send you home on torsemide  10 mg every other day as needed.  Take torsemide  as needed for swelling or shortness of breath or weight gain (more than 3 lbs in 1 day or 5 lbs in 1 week).  We've reduced your coreg  to 3.125 mg twice Caera Enwright day.  We've stopped your lisinopril  for now.    You have  anemia which is related to iron deficiency.  Continue every other day iron at home.  Follow with your PCP to determine how to work this up in the future.  Often, we consider procedures like colonoscopy/endoscopy for iron deficiency anemia to evaluate for chronic blood loss, but at 89, any procedure should be discussed with consideration of any risks in addition to the potential benefits.    Your eliquis  has been reduced to 2.5 mg twice Carolle Ishii day based on your age and weight.    Your imaging showed Aritza Brunet 5.2 cm left adrenal mass (possible adrenal myelolipoma).  We sent labs to rule out pheochromocytoma (which we think is unlikely).  Follow these pending labs with your PCP outpatient (pending urine metanephrines.  Your plasma metanephrines were normal).  You had Johari Bennetts 5.6 cm  angiomyolipoma.  These lesions should be followed by your PCP outpatient.    Return for new, recurrent, or worsening symptoms.  Please ask your PCP to request records from this hospitalization so they know what was done and what the next steps will be.   Increase activity slowly   Complete by: As directed       Allergies as of 10/02/2023       Reactions   Achromycin [tetracycline Hcl] Swelling, Other (See Comments)   Reaction:  Facial swelling    Celecoxib Diarrhea   Clarithromycin  Other (See Comments)   Reaction:  Unknown    Ezetimibe-simvastatin  Diarrhea, Nausea And Vomiting, Other (See Comments)   Reaction:  Muscle pain    Flagyl  [metronidazole ] Other (See Comments)   hallucinations   Penicillins Anaphylaxis, Other (See Comments)   Has patient had Latayna Ritchie PCN reaction causing immediate rash, facial/tongue/throat swelling, SOB or lightheadedness with hypotension: Yes Has patient had Jasin Brazel PCN reaction causing severe rash involving mucus membranes or skin necrosis: No Has patient had Marquarius Lofton PCN reaction that required hospitalization No Has patient had Tallen Schnorr PCN reaction occurring within the last 10 years: No If all of the above answers are "NO", then may proceed with Cephalosporin use.   Simvastatin  Other (See Comments)   Reaction:  Muscle pain    Clonazepam     SOB, weakness   Erythromycin    Sertraline     hyponatremia   Tramadol     Woozy, off balanced   Abilify  [aripiprazole ] Other (See Comments)   Reaction:  Insomnia         Medication List     STOP taking these medications    furosemide  20 MG tablet Commonly known as: LASIX    lisinopril  40 MG tablet Commonly known as: ZESTRIL        TAKE these medications    apixaban  2.5 MG Tabs tablet Commonly known as: ELIQUIS  Take 1 tablet (2.5 mg total) by mouth 2 (two) times daily. What changed:  medication strength how much to take   Arexvy 120 MCG/0.5ML injection Generic drug: RSV vaccine recomb adjuvanted   carvedilol  3.125  MG tablet Commonly known as: COREG  Take 1 tablet (3.125 mg total) by mouth 2 (two) times daily with Keanan Melander meal. What changed:  medication strength how much to take when to take this   Cholecalciferol  50 MCG (2000 UT) Caps Take 1 capsule (2,000 Units total) by mouth daily.   cyanocobalamin  1000 MCG tablet Commonly known as: VITAMIN B12 Take 1 tablet (1,000 mcg total) by mouth daily.   ferrous sulfate 324 MG Tbec Take 1 tablet (324 mg total) by mouth every other day. Follow up with Dr. Donnette Gal regarding your iron deficiency anemia.  What changed:  when to take this additional instructions   levothyroxine  75 MCG tablet Commonly known as: SYNTHROID  TAKE 1 TABLET BY MOUTH DAILY 5 DAYS Burnetta Kohls WEEK ONLY   LORazepam  0.5 MG tablet Commonly known as: ATIVAN  TAKE 1/2 TABLET BY MOUTH DAILY AS NEEDED FOR ANXIETY   mupirocin  ointment 2 % Commonly known as: BACTROBAN  On leg wound w/dressing change qd or bid   omeprazole  40 MG capsule Commonly known as: PRILOSEC TAKE 1 CAPSULE BY MOUTH EVERY DAY   ondansetron  4 MG disintegrating tablet Commonly known as: ZOFRAN -ODT Take 1 tablet (4 mg total) by mouth every 8 (eight) hours as needed for nausea or vomiting.   torsemide  10 MG tablet Commonly known as: DEMADEX  Take 1 tablet (10 mg total) by mouth as needed (Take every other day as needed for swelling, shortness of breath, or weight gain (>3 lbs in 1 day or 5 lbs in 1 week)).       Allergies  Allergen Reactions   Achromycin [Tetracycline Hcl] Swelling and Other (See Comments)    Reaction:  Facial swelling    Celecoxib Diarrhea   Clarithromycin  Other (See Comments)    Reaction:  Unknown    Ezetimibe-Simvastatin  Diarrhea, Nausea And Vomiting and Other (See Comments)    Reaction:  Muscle pain    Flagyl  [Metronidazole ] Other (See Comments)    hallucinations   Penicillins Anaphylaxis and Other (See Comments)    Has patient had Artez Regis PCN reaction causing immediate rash, facial/tongue/throat  swelling, SOB or lightheadedness with hypotension: Yes Has patient had Jeray Shugart PCN reaction causing severe rash involving mucus membranes or skin necrosis: No Has patient had Madeleine Fenn PCN reaction that required hospitalization No Has patient had Jinna Weinman PCN reaction occurring within the last 10 years: No If all of the above answers are "NO", then may proceed with Cephalosporin use.   Simvastatin  Other (See Comments)    Reaction:  Muscle pain    Clonazepam      SOB, weakness   Erythromycin    Sertraline      hyponatremia   Tramadol      Woozy, off balanced   Abilify  [Aripiprazole ] Other (See Comments)    Reaction:  Insomnia     Follow-up Information     [facility] Follow [facility].   Specialty: Home Health Services Why: Gasper Karst will provide PT and OT in the home after discharge. Contact information: [address] [place] [address] [phone]                  The results of significant diagnostics from this hospitalization (including imaging, microbiology, ancillary and laboratory) are listed below for reference.    Significant Diagnostic Studies: US  RENAL Result Date: 10/01/2023 CLINICAL DATA:  Acute kidney injury EXAM: RENAL / URINARY TRACT ULTRASOUND COMPLETE COMPARISON:  09/28/2023 FINDINGS: Right Kidney: Renal measurements: 7.9 x 3.5 x 4.4 cm = volume: 63.9 mL. Echogenicity within normal limits. No mass or hydronephrosis visualized. Left Kidney: Renal measurements: 8.6 x 4.3 by 5.0 cm = volume: 96.1 mL. Normal cortical echotexture. Mixed echogenicity solid mass off the lateral margin left kidney measuring 5.0 x 3.1 x 5.2 cm, corresponding to the left renal angiomyolipoma seen on preceding CT. No hydronephrosis. Bladder: Appears normal for degree of bladder distention. Other: None. IMPRESSION: 1. Left renal angiomyolipoma. 2. Otherwise unremarkable appearance of the kidneys. Electronically Signed   By: Bobbye Burrow M.D.   On: 10/01/2023 19:17   DG Chest 2 View Result  Date: 09/30/2023 CLINICAL DATA:  Dyspnea, CHF exacerbation EXAM: CHEST - 2 VIEW COMPARISON:  09/27/2023 FINDINGS: Seated frontal and lateral views. Accentuated kyphosis. Small bilateral pleural effusions are present, left greater than right. Mild left basilar compressive atelectasis with resultant retrocardiac opacification. Lungs are otherwise clear. No pneumothorax. Cardiac size is within normal limits. No acute bone abnormality. Osseous structures are age-appropriate. IMPRESSION: 1. Small bilateral pleural effusions, left greater than right. Electronically Signed   By: Worthy Heads M.D.   On: 09/30/2023 19:45   ECHOCARDIOGRAM COMPLETE Result Date: 09/28/2023    ECHOCARDIOGRAM REPORT   Patient Name:   [name] Desantiago Date of Exam: 09/28/2023 Medical Rec #:  [id number]        Height:       58.0 in Accession #:    [id number]       Weight:       117.0 lb Date of Birth:  10-Feb-1928        BSA:          1.450 m Patient Age:    89 years         BP:           120/58 mmHg Patient Gender: F                HR:           73 bpm. Exam Location:  Inpatient Procedure: 2D Echo, Color Doppler, Cardiac Doppler and Intracardiac            Opacification Agent (Both Spectral and Color Flow Doppler were            utilized during procedure). Indications:    CHF Acute Diastolic I50.31  History:        Patient has no prior history of Echocardiogram examinations.  Sonographer:    Hersey Lorenzo RDCS Referring Phys: [id number] VASUNDHRA RATHORE IMPRESSIONS  1. Left ventricular ejection fraction, by estimation, is 40 to 45%. The left ventricle has mildly decreased function. The left ventricle demonstrates regional wall motion abnormalities (see scoring diagram/findings for description). There is severe asymmetric left ventricular hypertrophy of the apical segment.  2. Right ventricular systolic function is normal. The right ventricular size is normal.  3. Left atrial size was severely dilated.  4. The mitral valve is normal in structure.  No evidence of mitral valve regurgitation. No evidence of mitral stenosis.  5. The aortic valve is normal in structure. There is moderate calcification of the aortic valve. There is moderate thickening of the aortic valve. Aortic valve regurgitation is not visualized. No aortic stenosis is present.  6. The inferior vena cava is normal in size with greater than 50% respiratory variability, suggesting right atrial pressure of 3 mmHg. FINDINGS  Left Ventricle: Left ventricular ejection fraction, by estimation, is 40 to 45%. The left ventricle has mildly decreased function. The left ventricle demonstrates regional wall motion abnormalities. The left ventricular internal cavity size was normal in size. There is severe asymmetric left ventricular hypertrophy of the apical segment.  LV Wall Scoring: The entire apex is akinetic. Right Ventricle: The right ventricular size is normal. No increase in right ventricular wall thickness. Right ventricular systolic function is normal. Left Atrium: Left atrial size was severely dilated. Right Atrium: Right atrial size was normal in size. Pericardium: There is no evidence of pericardial effusion. Mitral Valve: The mitral valve is normal in structure. Mild mitral annular calcification. No evidence of mitral valve regurgitation. No evidence of mitral valve stenosis. Tricuspid Valve: The tricuspid valve is normal  in structure. Tricuspid valve regurgitation is not demonstrated. No evidence of tricuspid stenosis. Aortic Valve: The aortic valve is normal in structure. There is moderate calcification of the aortic valve. There is moderate thickening of the aortic valve. Aortic valve regurgitation is not visualized. No aortic stenosis is present. Pulmonic Valve: The pulmonic valve was normal in structure. Pulmonic valve regurgitation is not visualized. No evidence of pulmonic stenosis. Aorta: The aortic root is normal in size and structure. Venous: The inferior vena cava is normal in size  with greater than 50% respiratory variability, suggesting right atrial pressure of 3 mmHg. IAS/Shunts: No atrial level shunt detected by color flow Doppler. Additional Comments: Severe apical hypertrophy cardiomyopathy variant.  LEFT VENTRICLE PLAX 2D LVIDd:         3.90 cm LVIDs:         2.80 cm LV PW:         0.80 cm LV IVS:        0.80 cm LVOT diam:     1.50 cm LV SV:         37 LV SV Index:   26 LVOT Area:     1.77 cm  RIGHT VENTRICLE            IVC RV S prime:     4.78 cm/s  IVC diam: 1.10 cm TAPSE (M-mode): 0.9 cm LEFT ATRIUM             Index        RIGHT ATRIUM           Index LA diam:        3.90 cm 2.69 cm/m   RA Area:     13.50 cm LA Vol (A2C):   79.9 ml 55.10 ml/m  RA Volume:   27.90 ml  19.24 ml/m LA Vol (A4C):   56.6 ml 39.03 ml/m LA Biplane Vol: 68.0 ml 46.90 ml/m  AORTIC VALVE LVOT Vmax:   90.70 cm/s LVOT Vmean:  67.100 cm/s LVOT VTI:    0.210 m  AORTA Ao Root diam: 2.50 cm Ao Asc diam:  2.60 cm MITRAL VALVE MV Area (PHT): 4.68 cm     SHUNTS MV Decel Time: 162 msec     Systemic VTI:  0.21 m MV E velocity: 108.00 cm/s  Systemic Diam: 1.50 cm MV Julien Oscar velocity: 30.80 cm/s MV E/Florencio Hollibaugh ratio:  3.51 Dorothye Gathers MD Electronically signed by Dorothye Gathers MD Signature Date/Time: 09/28/2023/1:07:21 PM    Final    CT ABDOMEN PELVIS WO CONTRAST Result Date: 09/28/2023 CLINICAL DATA:  Unintentional weight loss EXAM: CT ABDOMEN AND PELVIS WITHOUT CONTRAST TECHNIQUE: Multidetector CT imaging of the abdomen and pelvis was performed following the standard protocol without IV contrast. RADIATION DOSE REDUCTION: This exam was performed according to the departmental dose-optimization program which includes automated exposure control, adjustment of the mA and/or kV according to patient size and/or use of iterative reconstruction technique. COMPARISON:  07/17/2013 FINDINGS: Lower chest: Extensive multi-vessel coronary artery calcification. Global cardiac size within limits. No pericardial effusion. Moderate bilateral  pleural effusions are present. Hepatobiliary: No focal liver abnormality is seen. No gallstones, gallbladder wall thickening, or biliary dilatation. Pancreas: Unremarkable Spleen: Unremarkable Adrenals/Urinary Tract: The right adrenal gland is unremarkable. Left and right gland is largely replaced by Fleming Prill rounded mass which demonstrates interval increase in size now measuring 4.4 x 5.2 cm (previously measuring 3.4 x 3 8 cm). Additionally, the mass demonstrates increasingly soft tissue density and small amount of macroscopic fat is identified,  this represents Caliber Landess relative warty of the lesion. While and adrenal myelolipoma is not excluded, additional considerations should include adrenal pheochromocytoma which can occasionally contain small volume macroscopic fat due to lipid degeneration. Punctate foci calcification also noted. The kidneys are normal in size and position. Almost entirely lipomatous left renal angiomyolipoma demonstrates interval increase in size measuring 4.3 x 5.6 roughly 5.5 cm in greatest dimension. The kidneys otherwise. The bladder is unremarkable. Stomach/Bowel: Severe descending and sigmoid diverticulosis. Stomach, small bowel, and large bowel are otherwise unremarkable. Appendix superior. No evidence of obstruction or focal inflammation. No free intraperitoneal gas or fluid. Vascular/Lymphatic: Aortic atherosclerosis. No enlarged abdominal or pelvic lymph nodes. Reproductive: Status post hysterectomy. No adnexal masses. Pessary in place Other: No abdominal wall hernia or abnormality. No abdominopelvic ascites. Musculoskeletal: Osseous structures are diffusely osteopenic. Degenerative changes are seen within the thoracolumbar spine. No acute bone abnormality. IMPRESSION: 1. Interval increase in size and density of Clint Biello 5.2 cm left adrenal mass. While this may represent an adrenal myelolipoma, additional considerations should include adrenal pheochromocytoma which can occasionally contain small volume  macroscopic fat due to lipid degeneration. Correlation with biochemical markers of adrenal pheochromocytoma is recommended. 2. 5.6 cm nearly completely lipomatous left renal angiomyolipoma. 3. Severe descending and sigmoid diverticulosis. 4. Moderate bilateral pleural effusions. 5. Extensive multi-vessel coronary artery calcification. Aortic Atherosclerosis (ICD10-I70.0). Electronically Signed   By: Worthy Heads M.D.   On: 09/28/2023 02:58   DG Chest Port 1 View Result Date: 09/27/2023 CLINICAL DATA:  Short of breath, atrial fibrillation EXAM: PORTABLE CHEST 1 VIEW COMPARISON:  09/04/2023 FINDINGS: Single frontal view of the chest demonstrates an enlarged cardiac silhouette. Bibasilar veiling opacities, left greater than right, consistent with consolidation and effusions. No pneumothorax. No acute bony abnormalities. IMPRESSION: 1. Findings consistent with congestive heart failure, with bibasilar consolidation and effusions. Electronically Signed   By: Bobbye Burrow M.D.   On: 09/27/2023 20:09   DG Chest 2 View Result Date: 09/04/2023 CLINICAL DATA:  Dyspnea on exertion EXAM: CHEST - 2 VIEW COMPARISON:  Chest x-ray 12/03/2019 FINDINGS: There are new small bilateral pleural effusions, left greater than right. There is no focal lung infiltrate. The heart is mildly enlarged. No pneumothorax. Triangular shaped density overlies the left lung apex is likely related to overlying soft tissues. No acute fractures are seen. IMPRESSION: 1. New small bilateral pleural effusions, left greater than right. 2. Mild cardiomegaly. Electronically Signed   By: Tyron Gallon M.D.   On: 09/04/2023 17:54    Microbiology: No results found for this or any previous visit (from the past 240 hours).   Labs: Basic Metabolic Panel: Recent Labs  Lab 09/28/23 0538 09/29/23 0549 09/30/23 0446 10/01/23 0459 10/02/23 0513  NA 140 140 138 137 136  K 3.9 3.8 3.5 3.5 4.0  CL 105 104 104 102 100  CO2 23 27 23 24 25   GLUCOSE 98  99 95 111* 95  BUN 22 26* 30* 32* 31*  CREATININE 1.06* 1.49* 1.66* 1.84* 1.33*  CALCIUM  9.3 9.1 9.0 9.0 8.9   Liver Function Tests: Recent Labs  Lab 09/27/23 1927  AST 14*  ALT 8  ALKPHOS 53  BILITOT 0.6  PROT 6.6  ALBUMIN 3.6   No results for input(s): "LIPASE", "AMYLASE" in the last 168 hours. No results for input(s): "AMMONIA" in the last 168 hours. CBC: Recent Labs  Lab 09/27/23 1927 09/28/23 0538 09/29/23 0549 09/30/23 0446 10/01/23 0459 10/02/23 0513  WBC 6.6 5.5 5.3 6.0 6.2 6.3  NEUTROABS  4.1  --  3.0 3.1 3.9 3.6  HGB 8.7* 8.3* 8.5* 8.3* 8.3* 7.9*  HCT 31.6* 29.3* 30.4* 27.9* 29.2* 27.2*  MCV 88.3 86.7 88.9 82.8 86.6 86.6  PLT 327 275 293 283 277 263   Cardiac Enzymes: No results for input(s): "CKTOTAL", "CKMB", "CKMBINDEX", "TROPONINI" in the last 168 hours. BNP: BNP (last 3 results) Recent Labs    09/28/23 0538 09/30/23 1328  BNP 824.2* 575.4*    ProBNP (last 3 results) Recent Labs    09/06/23 1424  PROBNP 1,482.0*    CBG: No results for input(s): "GLUCAP" in the last 168 hours.     Signed:  Donnetta Gains MD.  [facility] 10/02/2023, 12:00 PM

## 2023-10-03 ENCOUNTER — Other Ambulatory Visit: Payer: Self-pay | Admitting: Internal Medicine

## 2023-10-03 ENCOUNTER — Telehealth: Payer: Self-pay | Admitting: Internal Medicine

## 2023-10-03 ENCOUNTER — Telehealth: Payer: Self-pay

## 2023-10-03 DIAGNOSIS — E039 Hypothyroidism, unspecified: Secondary | ICD-10-CM | POA: Diagnosis not present

## 2023-10-03 DIAGNOSIS — E0580 Other thyrotoxicosis without thyrotoxic crisis or storm: Secondary | ICD-10-CM | POA: Diagnosis not present

## 2023-10-03 DIAGNOSIS — I5021 Acute systolic (congestive) heart failure: Secondary | ICD-10-CM | POA: Diagnosis not present

## 2023-10-03 DIAGNOSIS — I5023 Acute on chronic systolic (congestive) heart failure: Secondary | ICD-10-CM

## 2023-10-03 DIAGNOSIS — N1831 Chronic kidney disease, stage 3a: Secondary | ICD-10-CM

## 2023-10-03 DIAGNOSIS — R634 Abnormal weight loss: Secondary | ICD-10-CM | POA: Diagnosis not present

## 2023-10-03 DIAGNOSIS — E8881 Metabolic syndrome: Secondary | ICD-10-CM | POA: Diagnosis not present

## 2023-10-03 DIAGNOSIS — R911 Solitary pulmonary nodule: Secondary | ICD-10-CM | POA: Diagnosis not present

## 2023-10-03 DIAGNOSIS — Z6824 Body mass index (BMI) 24.0-24.9, adult: Secondary | ICD-10-CM | POA: Diagnosis not present

## 2023-10-03 DIAGNOSIS — F419 Anxiety disorder, unspecified: Secondary | ICD-10-CM | POA: Diagnosis not present

## 2023-10-03 DIAGNOSIS — I4811 Longstanding persistent atrial fibrillation: Secondary | ICD-10-CM | POA: Diagnosis not present

## 2023-10-03 DIAGNOSIS — E559 Vitamin D deficiency, unspecified: Secondary | ICD-10-CM | POA: Diagnosis not present

## 2023-10-03 DIAGNOSIS — D631 Anemia in chronic kidney disease: Secondary | ICD-10-CM | POA: Diagnosis not present

## 2023-10-03 DIAGNOSIS — D1771 Benign lipomatous neoplasm of kidney: Secondary | ICD-10-CM | POA: Diagnosis not present

## 2023-10-03 DIAGNOSIS — K5730 Diverticulosis of large intestine without perforation or abscess without bleeding: Secondary | ICD-10-CM | POA: Diagnosis not present

## 2023-10-03 DIAGNOSIS — M797 Fibromyalgia: Secondary | ICD-10-CM | POA: Diagnosis not present

## 2023-10-03 DIAGNOSIS — D649 Anemia, unspecified: Secondary | ICD-10-CM

## 2023-10-03 DIAGNOSIS — I422 Other hypertrophic cardiomyopathy: Secondary | ICD-10-CM | POA: Diagnosis not present

## 2023-10-03 DIAGNOSIS — K449 Diaphragmatic hernia without obstruction or gangrene: Secondary | ICD-10-CM | POA: Diagnosis not present

## 2023-10-03 DIAGNOSIS — I130 Hypertensive heart and chronic kidney disease with heart failure and stage 1 through stage 4 chronic kidney disease, or unspecified chronic kidney disease: Secondary | ICD-10-CM | POA: Diagnosis not present

## 2023-10-03 DIAGNOSIS — C8400 Mycosis fungoides, unspecified site: Secondary | ICD-10-CM | POA: Diagnosis not present

## 2023-10-03 DIAGNOSIS — E785 Hyperlipidemia, unspecified: Secondary | ICD-10-CM | POA: Diagnosis not present

## 2023-10-03 DIAGNOSIS — M810 Age-related osteoporosis without current pathological fracture: Secondary | ICD-10-CM | POA: Diagnosis not present

## 2023-10-03 DIAGNOSIS — K5900 Constipation, unspecified: Secondary | ICD-10-CM | POA: Diagnosis not present

## 2023-10-03 DIAGNOSIS — N179 Acute kidney failure, unspecified: Secondary | ICD-10-CM | POA: Diagnosis not present

## 2023-10-03 DIAGNOSIS — E278 Other specified disorders of adrenal gland: Secondary | ICD-10-CM | POA: Diagnosis not present

## 2023-10-03 DIAGNOSIS — I4819 Other persistent atrial fibrillation: Secondary | ICD-10-CM

## 2023-10-03 DIAGNOSIS — D630 Anemia in neoplastic disease: Secondary | ICD-10-CM | POA: Diagnosis not present

## 2023-10-03 NOTE — Transitions of Care (Post Inpatient/ED Visit) (Signed)
   10/03/2023  Name: TYRONE TROISE MRN: [id number] DOB: 06-20-1927  Today's TOC FU Call Status: Today's TOC FU Call Status:: Unsuccessful Call (1st Attempt) Unsuccessful Call (1st Attempt) Date: 10/03/23  Attempted to reach the patient regarding the most recent Inpatient/ED visit.  Follow Up Plan: Additional outreach attempts will be made to reach the patient to complete the Transitions of Care (Post Inpatient/ED visit) call.    Katheryn Pandy MSN, RN RN Case Sales executive Health  [facility] Office Hours Wed/Thur  8:00 am-6:00 pm Direct Dial: [phone] Main Phone [phone]  Fax: [phone] .com

## 2023-10-03 NOTE — Telephone Encounter (Signed)
 Home health nursing ordered  Ok to take tylenol    Arexvy is a vaccine -- removed from list Mupirocin  removed from list   Continue to monitor BP and lightheadedness - may need to adjust medication if BP too low or frequently lightheaded

## 2023-10-03 NOTE — Telephone Encounter (Signed)
 Copied from CRM [id number]. Topic: Clinical - Home Health Verbal Orders >> Oct 03, 2023  2:10 PM [name] wrote: Caller/Agency: jim from [facility] Callback Number: [phone] Service Requested: Physical Therapy Frequency: one week one, two week four, one week four and jim request a home health nursing for diease and medication  management Any new concerns about the patient? Yes, patient was in the hospital and was discharged on yesterday and was diagnosed with congested heart failure. Patient blood pressure is 120/70 while she is sitting and standing it is 110/60. Patient heart rate was irregular and patient reports chronically that she has intermittent diarrhea, more sleepy and fatigued. Patient hemoglobin was low in the hospital. Josiah Nigh has medication concern. He states on the discharge paper she has mupirocin  and she is not taking the medication and also on the discharge paper it states she has an injection called arexvy and she does not have that medication. The medication she is taking that is not on her discharge paper is tylenol  650mg  she takes it up to 3x a day and Josiah Nigh want to know if that was ok for her to take

## 2023-10-04 ENCOUNTER — Telehealth: Payer: Self-pay

## 2023-10-04 NOTE — Telephone Encounter (Signed)
 Call daughter-not sure if she can identify any specific cause since it is intermittent.  If it is intermittent unlikely to be an infection.  If she having just 1 episode or multiple episodes?

## 2023-10-04 NOTE — Transitions of Care (Post Inpatient/ED Visit) (Signed)
   10/04/2023  Name: [name] MRN: [id number] DOB: Nov 19, 1927  Today's TOC FU Call Status: Today's TOC FU Call Status:: Successful TOC FU Call Completed TOC FU Call Complete Date: 10/04/23 (spoke with daughter, [name] who states [facility] home health PT has already come/reviewed medications, patient has PCP and cardiology appointments next and daughter declined TOC) Patient's Name and Date of Birth confirmed.  Transition Care Management Follow-up Telephone Call    Medications Reviewed Today:Daughter denied need to review stating [facility] already reviewed Medications Reviewed Today   Medications were not reviewed in this encounter     Home Care and Equipment/Supplies: Were Home Health Services Ordered?: Yes Name of Home Health Agency:: [facility] Has Agency set up a time to come to your home?: Yes First Home Health Visit Date: 10/03/23     Follow up appointments reviewed: PCP Follow-up appointment confirmed?: Yes Date of PCP follow-up appointment?: 10/07/23 Follow-up Provider: Oma Bias Specialist [facility] Follow-up appointment confirmed?: Yes Date of Specialist follow-up appointment?: 10/10/23 Follow-Up Specialty Provider:: Cardiology, Lawana Pray Do you need transportation to your follow-up appointment?: No  Daughter declined [facility] program and states she is currently staying with patient and monitoring weight and knows to call with weight gain. Daughter denied need to complete full assessment and states patient is doing well  Tonia Frankel RN, CCM [place]  [facility]Population [facility] RN Care Manager [phone]

## 2023-10-04 NOTE — Telephone Encounter (Signed)
 Spoke with patient's daughter today.  Patient has had this issue for quite sometime.  She is coming in Monday for follow up.

## 2023-10-05 LAB — METANEPHRINES, URINE, 24 HOUR
Metaneph Total, Ur: 80 ug/L
Metanephrines, 24H Ur: 80 ug/(24.h) (ref 36–209)
Normetanephrine, 24H Ur: 205 ug/(24.h) (ref 131–612)
Normetanephrine, Ur: 205 ug/L

## 2023-10-06 ENCOUNTER — Encounter: Payer: Self-pay | Admitting: Internal Medicine

## 2023-10-06 NOTE — Progress Notes (Unsigned)
 Subjective:    Patient ID: [name], female    DOB: [birth year], 89 y.o.   MRN: [id number]     HPI [name] is here for follow up from the hospital   Admitted 4/25 - 4/30 for acute exac CHF  Recommendations for Outpatient Follow-up:  Follow outpatient CBC/CMP  Follow volume status/labs outpatient Follow with cardiology outpatient Follow anemia outpatient - supplementing iron   Follow weight loss, poor PO intake outpatient Follow adrenal and renal masses outpatient - pending urine metanephrines Follow hypothyroidism outpatient, needs repeat labs    Admitted of SOB - CHF exacerbation and anemia - possible GI bleed.  She improved with diuresis.  Cardio was consulted. Hb remained stable.     Acute systolic CHF exacerbation: BNP  824 -> 575 Troponin minimally elevated w/ flat trend Cxr with bibasilar consolidations/effusions c/w CHF Echo EF 40-45%, regional WMA, severe asymmetric LVH of apical segments Cardio consulted - advised prn diuretic - torsemide  prn Coreg  3.125 mg bid  Acute on chronic normocytic anemia, iron  def anemia: No obvious signs of bleeding Hb stable Labs c/w low iron  S/p IV iron  - discharged on iron  every other day   Nausea, loss of appetite, unintentional weight loss: H/o gastric ulcer - h pylori Continue PPI and antiemetic  Left adrenal mass, left renal angiomyolipoma Ct ab/p showed masses Plasma and urine metanephrines wnl No f/u needed  Hypertension: Lisinopril  on hold Coreg  dose lowered  AKI on CKD 3a: Cr rose - likely from overdiuresis Improved prior to d/c  Chronic Afib Rate controlled Coreg  dose decreased Eliquis  initially held - restarted at 2.5 mg bid based on age/weight  Hypothyroidism: Tsh 0.306 Dose had already been decreased to 5 days/wk  Has follow-up with cardiology 5/8   Still SOB but it is better.  She is still experiencing nausea.  She has difficulty eating much when she starts eating after few bites  she does not feel well and has to go lay down.  Sometimes the shortness of breath and nausea come together, but not always.  Medications and allergies reviewed with patient and updated if appropriate.  Current Outpatient Medications on File Prior to Visit  Medication Sig Dispense Refill   apixaban  (ELIQUIS ) 2.5 MG TABS tablet Take 1 tablet (2.5 mg total) by mouth 2 (two) times daily. 60 tablet 1   carvedilol  (COREG ) 3.125 MG tablet Take 1 tablet (3.125 mg total) by mouth 2 (two) times daily with a meal. 30 tablet 1   Cholecalciferol  50 MCG (2000 UT) CAPS Take 1 capsule (2,000 Units total) by mouth daily. 30 capsule    ferrous sulfate  325 (65 FE) MG EC tablet Take 1 tablet (325 mg total) by mouth every other day. Follow up with Dr. Donnette Gal regarding your iron  deficiency anemia. 15 tablet 1   levothyroxine  (SYNTHROID ) 75 MCG tablet TAKE 1 TABLET BY MOUTH DAILY EXCEPT TAKE 1/2 TABLET ON TUESDAYS, THURSDAY AND SATURDAY 66 tablet 1   LORazepam  (ATIVAN ) 0.5 MG tablet TAKE 1/2 TABLET BY MOUTH DAILY AS NEEDED FOR ANXIETY 30 tablet 1   omeprazole  (PRILOSEC) 40 MG capsule TAKE 1 CAPSULE BY MOUTH EVERY DAY 90 capsule 3   ondansetron  (ZOFRAN -ODT) 4 MG disintegrating tablet Take 1 tablet (4 mg total) by mouth every 8 (eight) hours as needed for nausea or vomiting. 20 tablet 1   torsemide  (DEMADEX ) 10 MG tablet Take 1 tablet (10 mg total) by mouth as needed (Take every other day as needed for swelling, shortness of breath,  or weight gain (>3 lbs in 1 day or 5 lbs in 1 week)). 15 tablet 1   vitamin B-12 (CYANOCOBALAMIN ) 1000 MCG tablet Take 1 tablet (1,000 mcg total) by mouth daily.     No current facility-administered medications on file prior to visit.     Review of Systems  Constitutional:  Negative for fever.  Respiratory:  Positive for cough (at night) and shortness of breath. Negative for wheezing.   Cardiovascular:  Negative for chest pain, palpitations and leg swelling.  Gastrointestinal:  Positive  for diarrhea (intermittent bouts - chronic) and nausea (after eating, sometimes in the morning). Negative for abdominal pain.  Neurological:  Negative for light-headedness and headaches.       Objective:   Vitals:   10/07/23 1428  BP: 110/74  Pulse: (!) 115  Temp: 98.1 F (36.7 C)  SpO2: 95%   BP Readings from Last 3 Encounters:  10/07/23 110/74  10/02/23 (!) 102/59  09/27/23 114/70   Wt Readings from Last 3 Encounters:  10/07/23 114 lb (51.7 kg)  10/02/23 115 lb 4.8 oz (52.3 kg)  09/27/23 117 lb (53.1 kg)   Body mass index is 23.83 kg/m.    Physical Exam Constitutional:      General: She is not in acute distress.    Appearance: Normal appearance.  HENT:     Head: Normocephalic and atraumatic.  Eyes:     Conjunctiva/sclera: Conjunctivae normal.  Cardiovascular:     Rate and Rhythm: Normal rate. Rhythm irregular.     Heart sounds: Normal heart sounds.  Pulmonary:     Effort: Pulmonary effort is normal. No respiratory distress.     Breath sounds: Normal breath sounds. No wheezing.  Musculoskeletal:     Cervical back: Neck supple.     Right lower leg: No edema.     Left lower leg: No edema.  Lymphadenopathy:     Cervical: No cervical adenopathy.  Skin:    General: Skin is warm and dry.     Findings: No rash.  Neurological:     Mental Status: She is alert. Mental status is at baseline.  Psychiatric:        Mood and Affect: Mood normal.        Behavior: Behavior normal.        Lab Results  Component Value Date   WBC 6.3 10/02/2023   HGB 7.9 (L) 10/02/2023   HCT 27.2 (L) 10/02/2023   PLT 263 10/02/2023   GLUCOSE 95 10/02/2023   CHOL 188 01/21/2023   TRIG 164.0 (H) 01/21/2023   HDL 46.10 01/21/2023   LDLDIRECT 128.9 09/09/2012   LDLCALC 109 (H) 01/21/2023   ALT 8 09/27/2023   AST 14 (L) 09/27/2023   NA 136 10/02/2023   K 4.0 10/02/2023   CL 100 10/02/2023   CREATININE 1.33 (H) 10/02/2023   BUN 31 (H) 10/02/2023   CO2 25 10/02/2023   TSH 0.306  (L) 09/28/2023   INR 1.1 12/31/2016   HGBA1C 5.2 12/29/2019   MICROALBUR 0.2 04/24/2006   US  RENAL CLINICAL DATA:  Acute kidney injury  EXAM: RENAL / URINARY TRACT ULTRASOUND COMPLETE  COMPARISON:  09/28/2023  FINDINGS: Right Kidney:  Renal measurements: 7.9 x 3.5 x 4.4 cm = volume: 63.9 mL. Echogenicity within normal limits. No mass or hydronephrosis visualized.  Left Kidney:  Renal measurements: 8.6 x 4.3 by 5.0 cm = volume: 96.1 mL. Normal cortical echotexture. Mixed echogenicity solid mass off the lateral margin left kidney measuring 5.0 x  3.1 x 5.2 cm, corresponding to the left renal angiomyolipoma seen on preceding CT. No hydronephrosis.  Bladder:  Appears normal for degree of bladder distention.  Other:  None.  IMPRESSION: 1. Left renal angiomyolipoma. 2. Otherwise unremarkable appearance of the kidneys.  Electronically Signed   By: Bobbye Burrow M.D.   On: 10/01/2023 19:17    Assessment & Plan:    See Problem List for Assessment and Plan of chronic medical problems.

## 2023-10-06 NOTE — Patient Instructions (Incomplete)
      Blood work was ordered.       Medications changes include :   increase omeprazole  to twice a day.      Return in about 6 months (around 04/08/2024) for follow up.

## 2023-10-07 ENCOUNTER — Ambulatory Visit: Admitting: Internal Medicine

## 2023-10-07 ENCOUNTER — Telehealth: Payer: Self-pay | Admitting: Internal Medicine

## 2023-10-07 ENCOUNTER — Encounter (HOSPITAL_BASED_OUTPATIENT_CLINIC_OR_DEPARTMENT_OTHER): Payer: Self-pay

## 2023-10-07 VITALS — BP 110/74 | HR 115 | Temp 98.1°F | Ht <= 58 in | Wt 114.0 lb

## 2023-10-07 DIAGNOSIS — R5381 Other malaise: Secondary | ICD-10-CM | POA: Diagnosis not present

## 2023-10-07 DIAGNOSIS — N1831 Chronic kidney disease, stage 3a: Secondary | ICD-10-CM

## 2023-10-07 DIAGNOSIS — I10 Essential (primary) hypertension: Secondary | ICD-10-CM | POA: Diagnosis not present

## 2023-10-07 DIAGNOSIS — D509 Iron deficiency anemia, unspecified: Secondary | ICD-10-CM | POA: Diagnosis not present

## 2023-10-07 DIAGNOSIS — I5023 Acute on chronic systolic (congestive) heart failure: Secondary | ICD-10-CM | POA: Diagnosis not present

## 2023-10-07 DIAGNOSIS — I4819 Other persistent atrial fibrillation: Secondary | ICD-10-CM

## 2023-10-07 DIAGNOSIS — R110 Nausea: Secondary | ICD-10-CM

## 2023-10-07 DIAGNOSIS — E038 Other specified hypothyroidism: Secondary | ICD-10-CM

## 2023-10-07 LAB — CBC WITH DIFFERENTIAL/PLATELET
Basophils Absolute: 0 10*3/uL (ref 0.0–0.1)
Basophils Relative: 0.7 % (ref 0.0–3.0)
Eosinophils Absolute: 0 10*3/uL (ref 0.0–0.7)
Eosinophils Relative: 0.7 % (ref 0.0–5.0)
HCT: 31.6 % — ABNORMAL LOW (ref 36.0–46.0)
Hemoglobin: 9.7 g/dL — ABNORMAL LOW (ref 12.0–15.0)
Lymphocytes Relative: 19.4 % (ref 12.0–46.0)
Lymphs Abs: 1.4 10*3/uL (ref 0.7–4.0)
MCHC: 30.7 g/dL (ref 30.0–36.0)
MCV: 82.9 fl (ref 78.0–100.0)
Monocytes Absolute: 0.6 10*3/uL (ref 0.1–1.0)
Monocytes Relative: 8.9 % (ref 3.0–12.0)
Neutro Abs: 5 10*3/uL (ref 1.4–7.7)
Neutrophils Relative %: 70.3 % (ref 43.0–77.0)
Platelets: 310 10*3/uL (ref 150.0–400.0)
RBC: 3.82 Mil/uL — ABNORMAL LOW (ref 3.87–5.11)
RDW: 17.7 % — ABNORMAL HIGH (ref 11.5–15.5)
WBC: 7.1 10*3/uL (ref 4.0–10.5)

## 2023-10-07 LAB — COMPREHENSIVE METABOLIC PANEL WITH GFR
ALT: 8 U/L (ref 0–35)
AST: 14 U/L (ref 0–37)
Albumin: 3.8 g/dL (ref 3.5–5.2)
Alkaline Phosphatase: 55 U/L (ref 39–117)
BUN: 27 mg/dL — ABNORMAL HIGH (ref 6–23)
CO2: 26 meq/L (ref 19–32)
Calcium: 9.4 mg/dL (ref 8.4–10.5)
Chloride: 102 meq/L (ref 96–112)
Creatinine, Ser: 1.54 mg/dL — ABNORMAL HIGH (ref 0.40–1.20)
GFR: 28.89 mL/min — ABNORMAL LOW (ref >60.00)
Glucose, Bld: 140 mg/dL — ABNORMAL HIGH (ref 70–99)
Potassium: 4.6 meq/L (ref 3.5–5.1)
Sodium: 138 meq/L (ref 135–145)
Total Bilirubin: 0.4 mg/dL (ref 0.2–1.2)
Total Protein: 6.5 g/dL (ref 6.0–8.3)

## 2023-10-07 LAB — BRAIN NATRIURETIC PEPTIDE: Pro B Natriuretic peptide (BNP): 1131 pg/mL — ABNORMAL HIGH (ref 0.0–100.0)

## 2023-10-07 LAB — TSH: TSH: 2.58 u[IU]/mL (ref 0.35–5.50)

## 2023-10-07 MED ORDER — OMEPRAZOLE 40 MG PO CPDR: 40.0000 mg | DELAYED_RELEASE_CAPSULE | Freq: Two times a day (BID) | ORAL | Status: DC

## 2023-10-07 NOTE — Assessment & Plan Note (Signed)
 Chronic Follows with cardiology Continue carvedilol  3.125 mg twice daily and Eliquis  2.5 mg twice daily (both doses decreased while in the hospital)

## 2023-10-07 NOTE — Assessment & Plan Note (Signed)
 Chronic Last TSH was low and medication was adjusted TSH in hospital still slightly low-will recheck today since we are getting blood work Check tsh and will titrate med dose if needed Currently taking levothyroxine  75 mcg 4 days a week, 37.5 mcg 3 days a week

## 2023-10-07 NOTE — Assessment & Plan Note (Signed)
 Chronic BP well controlled Lisinopril  stopped in hospital Continue coreg  3.125 mg bid

## 2023-10-07 NOTE — Assessment & Plan Note (Signed)
 Subacute Has been experiencing nausea for a while Lasix  did seem to cause it, but no longer on that does not have that with torsemide  Has decreased appetite and has not been eating well-does get nauseous with eating Denies reflux, but I wonder if there is an element of GERD Increase omeprazole  to 40 mg twice daily Nausea could also be from her heart failure-advised taking the torsemide  every other day until they see cardiology and see if there is any improvement after torsemide  tomorrow

## 2023-10-07 NOTE — Telephone Encounter (Signed)
 Verbals given today.

## 2023-10-07 NOTE — Assessment & Plan Note (Signed)
 Acute on chronic Her physical deconditioning has worsened which is likely contributing some to her shortness of breath PT has started and will start OT

## 2023-10-07 NOTE — Assessment & Plan Note (Addendum)
 Recent hospitalization for acute CHF BNP initially 824 and improved to 575 with diuresis Echocardiogram with EF 40-45%, severe asymmetric LVH of apical segments Did not tolerate Lasix -prescribed torsemide  every other day or as needed-took a dose yesterday and they are not sure how often she should be taking this Advised to take every other day for now until they see cardiology in 3 days Will check BNP today, which I expect to still be elevated, but to gauge how her heart is doing Continue on coreg  3.125 mg bid

## 2023-10-07 NOTE — Telephone Encounter (Signed)
 Copied from CRM [id number]. Topic: Clinical - Home Health Verbal Orders >> Oct 03, 2023  2:10 PM [name] wrote: Caller/Agency: jim from [facility] Callback Number: [phone] Service Requested: Physical Therapy Frequency: one week one, two week four, one week four and jim request a home health nursing for diease and medication  management Any new concerns about the patient? Yes, patient was in the hospital and was discharged on yesterday and was diagnosed with congested heart failure. Patient blood pressure is 120/70 while she is sitting and standing it is 110/60. Patient heart rate was irregular and patient reports chronically that she has intermittent diarrhea, more sleepy and fatigued. Patient hemoglobin was low in the hospital. [name] has medication concern. He states on the discharge paper she has mupirocin  and she is not taking the medication and also on the discharge paper it states she has an injection called arexvy and she does not have that medication. The medication she is taking that is not on her discharge paper is tylenol  650mg  she takes it up to 3x a day and [name] want to know if that was ok for her to take >> Oct 07, 2023  9:02 AM [name] wrote: Nurse [name] called this morning to inform the clinic that the verbal order can be pushed back to next week. She requested that this update be communicated to the appropriate team or individual handling the order.  Callback Number: [phone]

## 2023-10-07 NOTE — Assessment & Plan Note (Addendum)
 Subacute Anemia thought to be acute on chronic normocytic anemia-iron  deficiency anemia There is no evidence of bleeding ?  Not taking in enough iron  or subtle bleed Hemoglobin was stable S/p IV iron  in the hospital Discharged home on iron  every other day Given nausea will increase omeprazole  to 40 mg twice daily CBC today

## 2023-10-07 NOTE — Telephone Encounter (Signed)
 Okay for verbal order to be pushed back.  I think we addressed the other issues.  She is coming in today.

## 2023-10-07 NOTE — Assessment & Plan Note (Addendum)
 Chronic Had AKI on CKD related to diuresis in the hospital Improved once diuresis was stopped Not drinking much fluid, but will work on improving how much she is drinking-I doubt she will overdo it become overloaded

## 2023-10-08 DIAGNOSIS — N1831 Chronic kidney disease, stage 3a: Secondary | ICD-10-CM | POA: Diagnosis not present

## 2023-10-08 DIAGNOSIS — N179 Acute kidney failure, unspecified: Secondary | ICD-10-CM | POA: Diagnosis not present

## 2023-10-08 DIAGNOSIS — D631 Anemia in chronic kidney disease: Secondary | ICD-10-CM | POA: Diagnosis not present

## 2023-10-08 DIAGNOSIS — I130 Hypertensive heart and chronic kidney disease with heart failure and stage 1 through stage 4 chronic kidney disease, or unspecified chronic kidney disease: Secondary | ICD-10-CM | POA: Diagnosis not present

## 2023-10-08 DIAGNOSIS — I5021 Acute systolic (congestive) heart failure: Secondary | ICD-10-CM | POA: Diagnosis not present

## 2023-10-08 DIAGNOSIS — C8400 Mycosis fungoides, unspecified site: Secondary | ICD-10-CM | POA: Diagnosis not present

## 2023-10-08 NOTE — Progress Notes (Unsigned)
 Cardiology Clinic Note   Patient Name: [name] Date of Encounter: 10/10/2023  Primary Care Provider:  [name], MD Primary Cardiologist: Dr. Nolan Battle  Patient Profile    [name] 89 year old female presents to clinic today for follow-up evaluation of her acute systolic CHF and hypertension.  Past Medical History    Past Medical History:  Diagnosis Date   Adrenal myelolipoma    Apical variant hypertrophic cardiomyopathy (HCC)    Echo 4/18: Normal LV size with marked apical hypertrophy. LVEF 60-65%. No WMA. Mild MR. Moderate left atrial enlargement. Normal RV size and function. Mild right atrial enlargemen   Chronic kidney disease    DIVERTICULOSIS, COLON    Dysmetabolic syndrome X    FIBROMYALGIA    Gastric ulcer due to Helicobacter pylori    Hiatal hernia    Hx of cataract surgery 01/08/2016   HYPERLIPIDEMIA    Hyperplastic colon polyp    HYPERTENSION, ESSENTIAL NOS    Hyponatremia    Hypothyroidism    MYCOSIS FUNGOIDES    Nodule of left lung    Normocytic anemia    Olecranon fracture 01/07/2016   OSTEOARTHRITIS    OSTEOPOROSIS    Persistent atrial fibrillation (HCC)    s/p DCCV 2018 // Apixaban  for anticoag   Renal angiomyolipoma    Unspecified vitamin D  deficiency    Past Surgical History:  Procedure Laterality Date   CARDIOVERSION N/A 01/03/2017   Procedure: CARDIOVERSION;  Surgeon: Loyde Rule, MD;  Location: [facility] ENDOSCOPY;  Service: Cardiovascular;  Laterality: N/A;   colonoscopy with polypectomy  07/25/2007   Dr Grandville Lax, Recheck in 7 years for 2009   G 4 P 2     I & D EXTREMITY Right 01/08/2016   Procedure: IRRIGATION AND DEBRIDEMENT OF OLECRANON FRACTURE;  Surgeon: Saundra Curl, MD;  Location: [facility] OR;  Service: Orthopedics;  Laterality: Right;   ORIF ELBOW FRACTURE Right 01/08/2016   Procedure: OPEN REDUCTION INTERNAL FIXATION (ORIF) ELBOW/OLECRANON FRACTURE, NEUROLYSIS;  Surgeon: Saundra Curl, MD;  Location: [facility] OR;  Service:  Orthopedics;  Laterality: Right;    Allergies  Allergies  Allergen Reactions   Achromycin [Tetracycline Hcl] Swelling and Other (See Comments)    Reaction:  Facial swelling    Celecoxib Diarrhea   Clarithromycin  Other (See Comments)    Reaction:  Unknown    Ezetimibe-Simvastatin  Diarrhea, Nausea And Vomiting and Other (See Comments)    Reaction:  Muscle pain    Flagyl  [Metronidazole ] Other (See Comments)    hallucinations   Penicillins Anaphylaxis and Other (See Comments)    Has patient had a PCN reaction causing immediate rash, facial/tongue/throat swelling, SOB or lightheadedness with hypotension: Yes Has patient had a PCN reaction causing severe rash involving mucus membranes or skin necrosis: No Has patient had a PCN reaction that required hospitalization No Has patient had a PCN reaction occurring within the last 10 years: No If all of the above answers are "NO", then may proceed with Cephalosporin use.   Simvastatin  Other (See Comments)    Reaction:  Muscle pain    Clonazepam      SOB, weakness   Erythromycin    Sertraline      hyponatremia   Tramadol      Woozy, off balanced   Abilify  [Aripiprazole ] Other (See Comments)    Reaction:  Insomnia     History of Present Illness    [name] has a PMH of adrenal myolipoma/renal angiomyolipoma, persistent atrial fibrillation, apical variant  hypertrophic cardiomyopathy, HTN, HLD, chronic kidney disease, gastric ulcer due to H. pylori, hiatal hernia, GERD, and anemia.  She underwent DCCV in 2018.  She did not tolerate calcium  channel blockers.  She was placed on apixaban  2.5 mg twice daily with reduced dose for age and weight less than 60 kg.  She was seen and evaluated by Dr. Micael Adas during hospital admission on 09/28/2023.  She was discharged on 10/02/2023.  Her CHA2DS2-VASc score was noted to be 6.  She was noted to have acute heart failure with mildly reduced LVEF.  This was new for her.  Her BNP was elevated at 824.  Chest  x-ray showed vascular congestion.  Her high-sensitivity troponins were noted to be 17 and then 19.  It was felt that she had demand ischemia.  Her echocardiogram showed mildly reduced LVEF of 45-50% with apical regional wall motion abnormalities.  Due to her advanced age and AKI it was felt that conservative management was the best course of treatment.  Her serum creatinine was noted to be 1.06-1.66 and 1.84.  Her BNP improved to 575.  She was continued on carvedilol  twice daily, lisinopril  40 mg daily.  Her blood pressure was noted to be low at times.  She not started on ACE Arni or ARB/MRA.  It was felt that avoiding SGLT2 inhibitor due to advanced age and increased risk for UTI was appropriate.  Her home dose of furosemide  20 mg every other day for lower extremity swelling, shortness of breath and weight gain greater than 3 pounds in a day or 5 pounds in 1 week was recommended.  She presents to the clinic today for follow-up evaluation and states she has been increasing her physical activity.  Her appetite has not yet returned.  She has been eating yogurt, drinking Ensure, and eating small bites.  Her weight has been maintaining.  Today in the clinic her weight is 115.  She denies chest pain and shortness of breath.  She reports compliance with her medications.  She presents with her daughter.  We reviewed her hospitalization.  They expressed understanding.  I will give her the salty 6 diet sheet, have her continue to increase her physical activity as tolerated, recommended small more frequent meals, and we will plan follow-up in 3 months.  She requests Dr. Renna Cary.  She was previously seen by Dr.Varanasi.  Today she denies chest pain, shortness of breath, lower extremity edema, , palpitations, melena, hematuria, hemoptysis, diaphoresis, weakness, presyncope, syncope, orthopnea, and PND.    Home Medications    Prior to Admission medications   Medication Sig Start Date End Date Taking? Authorizing  Provider  apixaban  (ELIQUIS ) 2.5 MG TABS tablet Take 1 tablet (2.5 mg total) by mouth 2 (two) times daily. 10/02/23 12/01/23  Etter Hermann., MD  carvedilol  (COREG ) 3.125 MG tablet Take 1 tablet (3.125 mg total) by mouth 2 (two) times daily with a meal. 10/02/23 11/01/23  Etter Hermann., MD  Cholecalciferol  50 MCG (2000 UT) CAPS Take 1 capsule (2,000 Units total) by mouth daily. 07/01/20   [name], MD  ferrous sulfate  325 (65 FE) MG EC tablet Take 1 tablet (325 mg total) by mouth every other day. Follow up with Dr. Donnette Gal regarding your iron  deficiency anemia. 10/02/23 12/01/23  Etter Hermann., MD  levothyroxine  (SYNTHROID ) 75 MCG tablet TAKE 1 TABLET BY MOUTH DAILY EXCEPT TAKE 1/2 TABLET ON TUESDAYS, THURSDAY AND SATURDAY 10/03/23   [name], MD  LORazepam  (ATIVAN )  0.5 MG tablet TAKE 1/2 TABLET BY MOUTH DAILY AS NEEDED FOR ANXIETY 07/04/23   Burns, Beckey Bourgeois, MD  omeprazole  (PRILOSEC) 40 MG capsule Take 1 capsule (40 mg total) by mouth 2 (two) times daily before a meal. 10/07/23   Burns, Beckey Bourgeois, MD  ondansetron  (ZOFRAN -ODT) 4 MG disintegrating tablet Take 1 tablet (4 mg total) by mouth every 8 (eight) hours as needed for nausea or vomiting. 09/04/23   [name], MD  torsemide  (DEMADEX ) 10 MG tablet Take 1 tablet (10 mg total) by mouth as needed (Take every other day as needed for swelling, shortness of breath, or weight gain (>3 lbs in 1 day or 5 lbs in 1 week)). 10/02/23 12/01/23  Etter Hermann., MD  vitamin B-12 (CYANOCOBALAMIN ) 1000 MCG tablet Take 1 tablet (1,000 mcg total) by mouth daily. 07/01/20   [name], MD    Family History    Family History  Problem Relation Age of Onset   Diabetes Mother    Heart attack Maternal Uncle 12       his 2 sons had MI in 27s   Hypertension Maternal Grandmother    Stroke Maternal Grandmother 74   She indicated that her mother is deceased. She indicated that her father is deceased. She indicated that her maternal  grandmother is deceased. She indicated that her maternal grandfather is deceased. She indicated that her paternal grandmother is deceased. She indicated that her paternal grandfather is deceased. She indicated that the status of her maternal uncle is unknown.  Social History    Social History   Socioeconomic History   Marital status: Widowed    Spouse name: Not on file   Number of children: 2   Years of education: Not on file   Highest education level: Not on file  Occupational History   Occupation: Retired    Associate Professor: RETIRED  Tobacco Use   Smoking status: Former    Current packs/day: 0.00    Average packs/day: 0.5 packs/day for 14.0 years (7.0 ttl pk-yrs)    Types: Cigarettes    Start date: 43    Quit date: 1956    Years since quitting: 69.3   Smokeless tobacco: Never   Tobacco comments:    smoked 1951-1965 , up to 1/2 ppd  Vaping Use   Vaping status: Never Used  Substance and Sexual Activity   Alcohol use: No   Drug use: No   Sexual activity: Not Currently    Partners: Male    Birth control/protection: Post-menopausal  Other Topics Concern   Not on file  Social History Narrative   Widowed.  Lives alone.  Ambulates without assistance.  Steady on feet.   Social Drivers of Corporate investment banker Strain: Not on file  Food Insecurity: No Food Insecurity (09/28/2023)   Hunger Vital Sign    Worried About Running Out of Food in the Last Year: Never true    Ran Out of Food in the Last Year: Never true  Transportation Needs: No Transportation Needs (09/28/2023)   PRAPARE - Administrator, Civil Service (Medical): No    Lack of Transportation (Non-Medical): No  Physical Activity: Not on file  Stress: Not on file  Social Connections: Socially Isolated (09/28/2023)   Social Connection and Isolation Panel [NHANES]    Frequency of Communication with Friends and Family: Twice a week    Frequency of Social Gatherings with Friends and Family: Twice a week  Attends Religious Services: Never    Active Member of Clubs or Organizations: No    Attends Banker Meetings: Never    Marital Status: Widowed  Intimate Partner Violence: Not At Risk (09/28/2023)   Humiliation, Afraid, Rape, and Kick questionnaire    Fear of Current or Ex-Partner: No    Emotionally Abused: No    Physically Abused: No    Sexually Abused: No     Review of Systems    General:  No chills, fever, night sweats or weight changes.  Cardiovascular:  No chest pain, dyspnea on exertion, edema, orthopnea, palpitations, paroxysmal nocturnal dyspnea. Dermatological: No rash, lesions/masses Respiratory: No cough, dyspnea Urologic: No hematuria, dysuria Abdominal:   No nausea, vomiting, diarrhea, bright red blood per rectum, melena, or hematemesis Neurologic:  No visual changes, wkns, changes in mental status. All other systems reviewed and are otherwise negative except as noted above.  Physical Exam    VS:  BP 110/60 (BP Location: Right Arm, Patient Position: Sitting, Cuff Size: Normal)   Pulse 71   Ht 4\' 10"  (1.473 m)   Wt 115 lb (52.2 kg)   LMP  (LMP Unknown) Comment: not sexually  SpO2 99%   BMI 24.04 kg/m  , BMI Body mass index is 24.04 kg/m. GEN: Well nourished, well developed, in no acute distress. HEENT: normal. Neck: Supple, no JVD, carotid bruits, or masses. Cardiac: RRR, no murmurs, rubs, or gallops. No clubbing, cyanosis, edema.  Radials/DP/PT 2+ and equal bilaterally.  Respiratory:  Respirations regular and unlabored, clear to auscultation bilaterally. GI: Soft, nontender, nondistended, BS + x 4. MS: no deformity or atrophy. Skin: warm and dry, no rash. Neuro:  Strength and sensation are intact. Psych: Normal affect.  Accessory Clinical Findings    Recent Labs: 09/30/2023: B Natriuretic Peptide 575.4 10/07/2023: ALT 8; BUN 27; Creatinine, Ser 1.54; Hemoglobin 9.7; Platelets 310.0; Potassium 4.6; Pro B Natriuretic peptide (BNP) 1,131.0; Sodium  138; TSH 2.58   Recent Lipid Panel    Component Value Date/Time   CHOL 188 01/21/2023 1406   TRIG 164.0 (H) 01/21/2023 1406   HDL 46.10 01/21/2023 1406   CHOLHDL 4 01/21/2023 1406   VLDL 32.8 01/21/2023 1406   LDLCALC 109 (H) 01/21/2023 1406   LDLDIRECT 128.9 09/09/2012 1129         ECG personally reviewed by me today-none today.    Echocardiogram 09/28/2023  IMPRESSIONS     1. Left ventricular ejection fraction, by estimation, is 40 to 45%. The  left ventricle has mildly decreased function. The left ventricle  demonstrates regional wall motion abnormalities (see scoring  diagram/findings for description). There is severe  asymmetric left ventricular hypertrophy of the apical segment.   2. Right ventricular systolic function is normal. The right ventricular  size is normal.   3. Left atrial size was severely dilated.   4. The mitral valve is normal in structure. No evidence of mitral valve  regurgitation. No evidence of mitral stenosis.   5. The aortic valve is normal in structure. There is moderate  calcification of the aortic valve. There is moderate thickening of the  aortic valve. Aortic valve regurgitation is not visualized. No aortic  stenosis is present.   6. The inferior vena cava is normal in size with greater than 50%  respiratory variability, suggesting right atrial pressure of 3 mmHg.   FINDINGS   Left Ventricle: Left ventricular ejection fraction, by estimation, is 40  to 45%. The left ventricle has mildly decreased function. The  left  ventricle demonstrates regional wall motion abnormalities. The left  ventricular internal cavity size was normal  in size. There is severe asymmetric left ventricular hypertrophy of the  apical segment.     LV Wall Scoring:  The entire apex is akinetic.   Right Ventricle: The right ventricular size is normal. No increase in  right ventricular wall thickness. Right ventricular systolic function is  normal.   Left  Atrium: Left atrial size was severely dilated.   Right Atrium: Right atrial size was normal in size.   Pericardium: There is no evidence of pericardial effusion.   Mitral Valve: The mitral valve is normal in structure. Mild mitral annular  calcification. No evidence of mitral valve regurgitation. No evidence of  mitral valve stenosis.   Tricuspid Valve: The tricuspid valve is normal in structure. Tricuspid  valve regurgitation is not demonstrated. No evidence of tricuspid  stenosis.   Aortic Valve: The aortic valve is normal in structure. There is moderate  calcification of the aortic valve. There is moderate thickening of the  aortic valve. Aortic valve regurgitation is not visualized. No aortic  stenosis is present.   Pulmonic Valve: The pulmonic valve was normal in structure. Pulmonic valve  regurgitation is not visualized. No evidence of pulmonic stenosis.   Aorta: The aortic root is normal in size and structure.   Venous: The inferior vena cava is normal in size with greater than 50%  respiratory variability, suggesting right atrial pressure of 3 mmHg.   IAS/Shunts: No atrial level shunt detected by color flow Doppler.   Additional Comments: Severe apical hypertrophy cardiomyopathy variant.       Assessment & Plan   1.  Acute CHF, apical hypertrophic cardiomyopathy-euvolemic.  Weight stable.  Echocardiogram showed mildly reduced EF of 45-50%, apical regional wall motion abnormalities.  BP today 110/60. Heart healthy low-sodium diet Maintain blood pressure log Continue carvedilol , torsemide  as needed (shortness of breath or weight gains 3 pounds overnight or 5 pounds in 1 week Elevate lower extremities when active  Atrial fibrillation-heart rate today 71.  Reports compliance with apixaban .  Denies bleeding issues. Avoid triggers caffeine, chocolate, EtOH, dehydration etc. Continue apixaban -reduce dose age, weight) Continue carvedilol   Essential hypertension-BP  today 110/60. Maintain blood pressure log Continue carvedilol  Heart healthy low-sodium diet-salty 6 diet sheet given  Disposition: Follow-up with Dr. Alphonzo Jenkins or me in 2-3 months.    Chet Cota. Nyasiah Moffet NP-C     10/10/2023, 2:24 PM [facility] [address] Office [phone] Fax [phone]    I spent 14 minutes examining this patient, reviewing medications, and using patient centered shared decision making involving their cardiac care.   I spent  20 minutes reviewing past medical history,  medications, and prior cardiac tests.

## 2023-10-09 ENCOUNTER — Encounter: Payer: Self-pay | Admitting: Internal Medicine

## 2023-10-09 DIAGNOSIS — D631 Anemia in chronic kidney disease: Secondary | ICD-10-CM | POA: Diagnosis not present

## 2023-10-09 DIAGNOSIS — N1831 Chronic kidney disease, stage 3a: Secondary | ICD-10-CM | POA: Diagnosis not present

## 2023-10-09 DIAGNOSIS — N179 Acute kidney failure, unspecified: Secondary | ICD-10-CM | POA: Diagnosis not present

## 2023-10-09 DIAGNOSIS — I5021 Acute systolic (congestive) heart failure: Secondary | ICD-10-CM | POA: Diagnosis not present

## 2023-10-09 DIAGNOSIS — I130 Hypertensive heart and chronic kidney disease with heart failure and stage 1 through stage 4 chronic kidney disease, or unspecified chronic kidney disease: Secondary | ICD-10-CM | POA: Diagnosis not present

## 2023-10-09 DIAGNOSIS — C8400 Mycosis fungoides, unspecified site: Secondary | ICD-10-CM | POA: Diagnosis not present

## 2023-10-10 ENCOUNTER — Ambulatory Visit: Attending: General Practice | Admitting: General Practice

## 2023-10-10 ENCOUNTER — Encounter: Payer: Self-pay | Admitting: General Practice

## 2023-10-10 VITALS — BP 110/60 | HR 71 | Ht <= 58 in | Wt 115.0 lb

## 2023-10-10 DIAGNOSIS — I422 Other hypertrophic cardiomyopathy: Secondary | ICD-10-CM | POA: Diagnosis not present

## 2023-10-10 DIAGNOSIS — I4819 Other persistent atrial fibrillation: Secondary | ICD-10-CM | POA: Diagnosis not present

## 2023-10-10 DIAGNOSIS — I10 Essential (primary) hypertension: Secondary | ICD-10-CM | POA: Diagnosis not present

## 2023-10-10 NOTE — Patient Instructions (Addendum)
 Medication Instructions:  Remember to weigh your self daily.   *If you need a refill on your cardiac medications before your next appointment, please call your pharmacy*   Lab Work: No labs were ordered during today's visit.  If you have labs (blood work) drawn today and your tests are completely normal, you will receive your results only by: MyChart Message (if you have MyChart) OR A paper copy in the mail If you have any lab test that is abnormal or we need to change your treatment, we will call you to review the results.   Testing/Procedures: No procedures were ordered during today's visit.    Follow-Up: At [facility], you and your health needs are our priority.  As part of our continuing mission to provide you with exceptional heart care, we have created designated Provider Care Teams.  These Care Teams include your primary Cardiologist (physician) and Advanced Practice Providers (APPs -  Physician Assistants and Nurse Practitioners) who all work together to provide you with the care you need, when you need it.  We recommend signing up for the patient portal called "MyChart".  Sign up information is provided on this After Visit Summary.  MyChart is used to connect with patients for Virtual Visits (Telemedicine).  Patients are able to view lab/test results, encounter notes, upcoming appointments, etc.  Non-urgent messages can be sent to your provider as well.   To learn more about what you can do with MyChart, go to [email or web address].    Your next appointment:   3 month(s)  Provider:   Dr. Dorothye Gathers or Lawana Pray  Other Instructions  [facility]:     Thank you for choosing [facility]!

## 2023-10-11 ENCOUNTER — Telehealth: Payer: Self-pay | Admitting: General Practice

## 2023-10-11 ENCOUNTER — Telehealth: Payer: Self-pay | Admitting: Internal Medicine

## 2023-10-11 DIAGNOSIS — N179 Acute kidney failure, unspecified: Secondary | ICD-10-CM | POA: Diagnosis not present

## 2023-10-11 DIAGNOSIS — C8400 Mycosis fungoides, unspecified site: Secondary | ICD-10-CM | POA: Diagnosis not present

## 2023-10-11 DIAGNOSIS — I130 Hypertensive heart and chronic kidney disease with heart failure and stage 1 through stage 4 chronic kidney disease, or unspecified chronic kidney disease: Secondary | ICD-10-CM | POA: Diagnosis not present

## 2023-10-11 DIAGNOSIS — I5021 Acute systolic (congestive) heart failure: Secondary | ICD-10-CM | POA: Diagnosis not present

## 2023-10-11 DIAGNOSIS — D631 Anemia in chronic kidney disease: Secondary | ICD-10-CM | POA: Diagnosis not present

## 2023-10-11 DIAGNOSIS — N1831 Chronic kidney disease, stage 3a: Secondary | ICD-10-CM | POA: Diagnosis not present

## 2023-10-11 NOTE — Telephone Encounter (Signed)
 Copied from CRM [id number]. Topic: Clinical - Home Health Verbal Orders >> Oct 11, 2023  8:17 AM Winnifred Havers wrote: Caller/Agency: [facility]  Callback Number: [phone] Service Requested: Occupational Therapy  Frequency: 1 week 3 functional mobility, safety, heart failure education  Any new concerns about the patient? Yes

## 2023-10-11 NOTE — Telephone Encounter (Signed)
 Okay for orders?

## 2023-10-11 NOTE — Telephone Encounter (Signed)
 Returned call to patient's daughter Jerryl Morin she was calling to verify Torsemide  dose.Advised take Torsemide  10 mg every other day as needed for sob,swelling,weight gain 3 lbs over night or 5 lbs in 1 week.

## 2023-10-11 NOTE — Telephone Encounter (Signed)
 Patient c/o Palpitations:  STAT if patient reporting lightheadedness, shortness of breath, or chest pain  How long have you had palpitations/irregular HR/ Afib? Are you having the symptoms now? Home health nurse was in home and stated her HR was up a little   Are you currently experiencing lightheadedness, SOB or CP? He stated that pt did have some SOB that had been going for a little while   Do you have a history of afib (atrial fibrillation) or irregular heart rhythm? Yes   Have you checked your BP or HR? (document readings if available): 150/80 seated and 130/70 standing/ Hr was in the 70's and 80's   Are you experiencing any other symptoms?   They would like to know what the dosage needs to be for the torsemide  (DEMADEX ) 10 MG table   Daughter number [phone] Physical therapist Josiah [phone]

## 2023-10-11 NOTE — Telephone Encounter (Signed)
Verbals given to [facility] today.

## 2023-10-14 DIAGNOSIS — N179 Acute kidney failure, unspecified: Secondary | ICD-10-CM | POA: Diagnosis not present

## 2023-10-14 DIAGNOSIS — C8400 Mycosis fungoides, unspecified site: Secondary | ICD-10-CM | POA: Diagnosis not present

## 2023-10-14 DIAGNOSIS — I130 Hypertensive heart and chronic kidney disease with heart failure and stage 1 through stage 4 chronic kidney disease, or unspecified chronic kidney disease: Secondary | ICD-10-CM | POA: Diagnosis not present

## 2023-10-14 DIAGNOSIS — I5021 Acute systolic (congestive) heart failure: Secondary | ICD-10-CM | POA: Diagnosis not present

## 2023-10-14 DIAGNOSIS — N1831 Chronic kidney disease, stage 3a: Secondary | ICD-10-CM | POA: Diagnosis not present

## 2023-10-14 DIAGNOSIS — D631 Anemia in chronic kidney disease: Secondary | ICD-10-CM | POA: Diagnosis not present

## 2023-10-15 ENCOUNTER — Other Ambulatory Visit (HOSPITAL_COMMUNITY): Payer: Self-pay

## 2023-10-16 DIAGNOSIS — I5021 Acute systolic (congestive) heart failure: Secondary | ICD-10-CM | POA: Diagnosis not present

## 2023-10-16 DIAGNOSIS — C8400 Mycosis fungoides, unspecified site: Secondary | ICD-10-CM | POA: Diagnosis not present

## 2023-10-16 DIAGNOSIS — I130 Hypertensive heart and chronic kidney disease with heart failure and stage 1 through stage 4 chronic kidney disease, or unspecified chronic kidney disease: Secondary | ICD-10-CM | POA: Diagnosis not present

## 2023-10-16 DIAGNOSIS — N179 Acute kidney failure, unspecified: Secondary | ICD-10-CM | POA: Diagnosis not present

## 2023-10-16 DIAGNOSIS — D631 Anemia in chronic kidney disease: Secondary | ICD-10-CM | POA: Diagnosis not present

## 2023-10-16 DIAGNOSIS — N1831 Chronic kidney disease, stage 3a: Secondary | ICD-10-CM | POA: Diagnosis not present

## 2023-10-17 DIAGNOSIS — N1831 Chronic kidney disease, stage 3a: Secondary | ICD-10-CM | POA: Diagnosis not present

## 2023-10-17 DIAGNOSIS — I130 Hypertensive heart and chronic kidney disease with heart failure and stage 1 through stage 4 chronic kidney disease, or unspecified chronic kidney disease: Secondary | ICD-10-CM | POA: Diagnosis not present

## 2023-10-17 DIAGNOSIS — N179 Acute kidney failure, unspecified: Secondary | ICD-10-CM | POA: Diagnosis not present

## 2023-10-17 DIAGNOSIS — D631 Anemia in chronic kidney disease: Secondary | ICD-10-CM | POA: Diagnosis not present

## 2023-10-17 DIAGNOSIS — I5021 Acute systolic (congestive) heart failure: Secondary | ICD-10-CM | POA: Diagnosis not present

## 2023-10-17 DIAGNOSIS — C8400 Mycosis fungoides, unspecified site: Secondary | ICD-10-CM | POA: Diagnosis not present

## 2023-10-18 DIAGNOSIS — I422 Other hypertrophic cardiomyopathy: Secondary | ICD-10-CM | POA: Diagnosis not present

## 2023-10-18 DIAGNOSIS — M797 Fibromyalgia: Secondary | ICD-10-CM | POA: Diagnosis not present

## 2023-10-18 DIAGNOSIS — I130 Hypertensive heart and chronic kidney disease with heart failure and stage 1 through stage 4 chronic kidney disease, or unspecified chronic kidney disease: Secondary | ICD-10-CM | POA: Diagnosis not present

## 2023-10-18 DIAGNOSIS — D630 Anemia in neoplastic disease: Secondary | ICD-10-CM | POA: Diagnosis not present

## 2023-10-18 DIAGNOSIS — N179 Acute kidney failure, unspecified: Secondary | ICD-10-CM | POA: Diagnosis not present

## 2023-10-18 DIAGNOSIS — I4811 Longstanding persistent atrial fibrillation: Secondary | ICD-10-CM | POA: Diagnosis not present

## 2023-10-18 DIAGNOSIS — M810 Age-related osteoporosis without current pathological fracture: Secondary | ICD-10-CM | POA: Diagnosis not present

## 2023-10-18 DIAGNOSIS — C8400 Mycosis fungoides, unspecified site: Secondary | ICD-10-CM | POA: Diagnosis not present

## 2023-10-18 DIAGNOSIS — N1831 Chronic kidney disease, stage 3a: Secondary | ICD-10-CM | POA: Diagnosis not present

## 2023-10-18 DIAGNOSIS — D631 Anemia in chronic kidney disease: Secondary | ICD-10-CM | POA: Diagnosis not present

## 2023-10-18 DIAGNOSIS — I5021 Acute systolic (congestive) heart failure: Secondary | ICD-10-CM | POA: Diagnosis not present

## 2023-10-18 DIAGNOSIS — D1771 Benign lipomatous neoplasm of kidney: Secondary | ICD-10-CM | POA: Diagnosis not present

## 2023-10-21 ENCOUNTER — Telehealth: Payer: Self-pay | Admitting: Internal Medicine

## 2023-10-21 DIAGNOSIS — I5021 Acute systolic (congestive) heart failure: Secondary | ICD-10-CM | POA: Diagnosis not present

## 2023-10-21 DIAGNOSIS — N179 Acute kidney failure, unspecified: Secondary | ICD-10-CM | POA: Diagnosis not present

## 2023-10-21 DIAGNOSIS — C8400 Mycosis fungoides, unspecified site: Secondary | ICD-10-CM | POA: Diagnosis not present

## 2023-10-21 DIAGNOSIS — I130 Hypertensive heart and chronic kidney disease with heart failure and stage 1 through stage 4 chronic kidney disease, or unspecified chronic kidney disease: Secondary | ICD-10-CM | POA: Diagnosis not present

## 2023-10-21 DIAGNOSIS — D631 Anemia in chronic kidney disease: Secondary | ICD-10-CM | POA: Diagnosis not present

## 2023-10-21 DIAGNOSIS — N1831 Chronic kidney disease, stage 3a: Secondary | ICD-10-CM | POA: Diagnosis not present

## 2023-10-21 NOTE — Telephone Encounter (Addendum)
 Iron  is likely playing a role.   Not sure what she is taking currently ---   Can take take miralax  daily - twice daily.  Can take stool softener. Eat prunes, drink prune juice.    If she has to can try taking iron  every other day or switch to slow release iron  which is less likely to cause constipation

## 2023-10-21 NOTE — Telephone Encounter (Signed)
 Copied from CRM [id number]. Topic: Clinical - Medical Advice >> Oct 21, 2023  1:50 PM Albertha Alosa wrote: Reason for CRM: Jola Nash from [facility] Called in regarding the patient, Stated she is struggling with constipation, wanted to see what it was that she could take , daughter wonder is the ron playing a role, and if so what can be done about it.   Patient BP is 150/80 uncomfortable because of the bowel issue,  Josiah Nigh number is [phone] foe any questions   Call daughter Jerryl Morin on what to do with patient going forward

## 2023-10-22 NOTE — Telephone Encounter (Signed)
 Spoke with daughter today

## 2023-10-23 DIAGNOSIS — I130 Hypertensive heart and chronic kidney disease with heart failure and stage 1 through stage 4 chronic kidney disease, or unspecified chronic kidney disease: Secondary | ICD-10-CM | POA: Diagnosis not present

## 2023-10-23 DIAGNOSIS — I5021 Acute systolic (congestive) heart failure: Secondary | ICD-10-CM | POA: Diagnosis not present

## 2023-10-23 DIAGNOSIS — D631 Anemia in chronic kidney disease: Secondary | ICD-10-CM | POA: Diagnosis not present

## 2023-10-23 DIAGNOSIS — N1831 Chronic kidney disease, stage 3a: Secondary | ICD-10-CM | POA: Diagnosis not present

## 2023-10-23 DIAGNOSIS — C8400 Mycosis fungoides, unspecified site: Secondary | ICD-10-CM | POA: Diagnosis not present

## 2023-10-23 DIAGNOSIS — N179 Acute kidney failure, unspecified: Secondary | ICD-10-CM | POA: Diagnosis not present

## 2023-10-25 ENCOUNTER — Other Ambulatory Visit: Payer: Self-pay

## 2023-10-25 MED ORDER — FERROUS SULFATE 325 (65 FE) MG PO TBEC: 325.0000 mg | DELAYED_RELEASE_TABLET | ORAL | Status: AC

## 2023-10-29 ENCOUNTER — Telehealth: Payer: Self-pay | Admitting: Internal Medicine

## 2023-10-29 NOTE — Telephone Encounter (Signed)
 Okay for orders?

## 2023-10-29 NOTE — Telephone Encounter (Signed)
 Copied from CRM [id number]. Topic: Clinical - Home Health Verbal Orders >> Oct 25, 2023  4:42 PM Hassie Lint wrote: Caller/Agency: [facility] Callback Number: [phone] Service Requested: Occupational Therapy Frequency: Patient missed 1 appointment, Care giver declined. Asking to add 1 additional visit for patient to make up. Any new concerns about the patient? No

## 2023-10-29 NOTE — Telephone Encounter (Signed)
Verbals left on voicemail today. 

## 2023-10-30 ENCOUNTER — Ambulatory Visit: Payer: Self-pay

## 2023-10-30 DIAGNOSIS — D631 Anemia in chronic kidney disease: Secondary | ICD-10-CM | POA: Diagnosis not present

## 2023-10-30 DIAGNOSIS — I130 Hypertensive heart and chronic kidney disease with heart failure and stage 1 through stage 4 chronic kidney disease, or unspecified chronic kidney disease: Secondary | ICD-10-CM | POA: Diagnosis not present

## 2023-10-30 DIAGNOSIS — I5021 Acute systolic (congestive) heart failure: Secondary | ICD-10-CM | POA: Diagnosis not present

## 2023-10-30 DIAGNOSIS — N179 Acute kidney failure, unspecified: Secondary | ICD-10-CM | POA: Diagnosis not present

## 2023-10-30 DIAGNOSIS — C8400 Mycosis fungoides, unspecified site: Secondary | ICD-10-CM | POA: Diagnosis not present

## 2023-10-30 DIAGNOSIS — N1831 Chronic kidney disease, stage 3a: Secondary | ICD-10-CM | POA: Diagnosis not present

## 2023-10-30 NOTE — Telephone Encounter (Signed)
 Continue to monitor.  Of course the weight does not fluctuate a little bit, but if there is weight gain of 3 pounds in 1 day or 5 pounds over 1 week the next when she needs to take the medication.

## 2023-10-30 NOTE — Telephone Encounter (Signed)
 DPR [name] states that she is concerned about the pts weight. Caller states that she was 114 lbs last week 5/23 and was 117 lbs today. DPR states that the pt was maintaining for "awhile". 116 lb yesterday per the daughter. DPR calling to let PCP know. Pt does not have any s/s at this time. RN advised daughter to also inform pt cardiologist.  Copied from CRM [id number]. Topic: Clinical - Medical Advice >> Oct 30, 2023 10:16 AM Stanly Early wrote: Reason for CRM: Daughter is calling because her mother has gain weight on medication torsemide  (DEMADEX ) 10 MG tablet. and have a few questions. [phone]

## 2023-10-31 ENCOUNTER — Telehealth: Payer: Self-pay | Admitting: Internal Medicine

## 2023-10-31 NOTE — Telephone Encounter (Signed)
 Copied from CRM [id number]. Topic: General - Other >> Oct 30, 2023  2:20 PM Deaijah H wrote: Reason for CRM: Jim PT [facility] health called in to provide weight values today 117/2 days ago 113.6 and new dx of CHF. Swelling distal to ankles left +2 and right +1. lungs clear. Oxygen high. If needed, please call [phone]

## 2023-10-31 NOTE — Telephone Encounter (Signed)
 Called and left message for patient's daughter today.  My-chart also sent

## 2023-10-31 NOTE — Telephone Encounter (Signed)
 noted

## 2023-11-01 DIAGNOSIS — I5021 Acute systolic (congestive) heart failure: Secondary | ICD-10-CM | POA: Diagnosis not present

## 2023-11-01 DIAGNOSIS — I130 Hypertensive heart and chronic kidney disease with heart failure and stage 1 through stage 4 chronic kidney disease, or unspecified chronic kidney disease: Secondary | ICD-10-CM | POA: Diagnosis not present

## 2023-11-01 DIAGNOSIS — N1831 Chronic kidney disease, stage 3a: Secondary | ICD-10-CM | POA: Diagnosis not present

## 2023-11-01 DIAGNOSIS — D631 Anemia in chronic kidney disease: Secondary | ICD-10-CM | POA: Diagnosis not present

## 2023-11-01 DIAGNOSIS — C8400 Mycosis fungoides, unspecified site: Secondary | ICD-10-CM | POA: Diagnosis not present

## 2023-11-01 DIAGNOSIS — N179 Acute kidney failure, unspecified: Secondary | ICD-10-CM | POA: Diagnosis not present

## 2023-11-02 DIAGNOSIS — R634 Abnormal weight loss: Secondary | ICD-10-CM | POA: Diagnosis not present

## 2023-11-02 DIAGNOSIS — M797 Fibromyalgia: Secondary | ICD-10-CM | POA: Diagnosis not present

## 2023-11-02 DIAGNOSIS — D630 Anemia in neoplastic disease: Secondary | ICD-10-CM | POA: Diagnosis not present

## 2023-11-02 DIAGNOSIS — D1771 Benign lipomatous neoplasm of kidney: Secondary | ICD-10-CM | POA: Diagnosis not present

## 2023-11-02 DIAGNOSIS — K5730 Diverticulosis of large intestine without perforation or abscess without bleeding: Secondary | ICD-10-CM | POA: Diagnosis not present

## 2023-11-02 DIAGNOSIS — R911 Solitary pulmonary nodule: Secondary | ICD-10-CM | POA: Diagnosis not present

## 2023-11-02 DIAGNOSIS — M810 Age-related osteoporosis without current pathological fracture: Secondary | ICD-10-CM | POA: Diagnosis not present

## 2023-11-02 DIAGNOSIS — E039 Hypothyroidism, unspecified: Secondary | ICD-10-CM | POA: Diagnosis not present

## 2023-11-02 DIAGNOSIS — E278 Other specified disorders of adrenal gland: Secondary | ICD-10-CM | POA: Diagnosis not present

## 2023-11-02 DIAGNOSIS — I4811 Longstanding persistent atrial fibrillation: Secondary | ICD-10-CM | POA: Diagnosis not present

## 2023-11-02 DIAGNOSIS — I422 Other hypertrophic cardiomyopathy: Secondary | ICD-10-CM | POA: Diagnosis not present

## 2023-11-02 DIAGNOSIS — K5900 Constipation, unspecified: Secondary | ICD-10-CM | POA: Diagnosis not present

## 2023-11-02 DIAGNOSIS — N1831 Chronic kidney disease, stage 3a: Secondary | ICD-10-CM | POA: Diagnosis not present

## 2023-11-02 DIAGNOSIS — N179 Acute kidney failure, unspecified: Secondary | ICD-10-CM | POA: Diagnosis not present

## 2023-11-02 DIAGNOSIS — C8400 Mycosis fungoides, unspecified site: Secondary | ICD-10-CM | POA: Diagnosis not present

## 2023-11-02 DIAGNOSIS — F419 Anxiety disorder, unspecified: Secondary | ICD-10-CM | POA: Diagnosis not present

## 2023-11-02 DIAGNOSIS — E0580 Other thyrotoxicosis without thyrotoxic crisis or storm: Secondary | ICD-10-CM | POA: Diagnosis not present

## 2023-11-02 DIAGNOSIS — E559 Vitamin D deficiency, unspecified: Secondary | ICD-10-CM | POA: Diagnosis not present

## 2023-11-02 DIAGNOSIS — D631 Anemia in chronic kidney disease: Secondary | ICD-10-CM | POA: Diagnosis not present

## 2023-11-02 DIAGNOSIS — I5021 Acute systolic (congestive) heart failure: Secondary | ICD-10-CM | POA: Diagnosis not present

## 2023-11-02 DIAGNOSIS — E785 Hyperlipidemia, unspecified: Secondary | ICD-10-CM | POA: Diagnosis not present

## 2023-11-02 DIAGNOSIS — I130 Hypertensive heart and chronic kidney disease with heart failure and stage 1 through stage 4 chronic kidney disease, or unspecified chronic kidney disease: Secondary | ICD-10-CM | POA: Diagnosis not present

## 2023-11-02 DIAGNOSIS — Z6824 Body mass index (BMI) 24.0-24.9, adult: Secondary | ICD-10-CM | POA: Diagnosis not present

## 2023-11-02 DIAGNOSIS — K449 Diaphragmatic hernia without obstruction or gangrene: Secondary | ICD-10-CM | POA: Diagnosis not present

## 2023-11-02 DIAGNOSIS — E8881 Metabolic syndrome: Secondary | ICD-10-CM | POA: Diagnosis not present

## 2023-11-06 ENCOUNTER — Other Ambulatory Visit: Payer: Self-pay | Admitting: Internal Medicine

## 2023-11-06 DIAGNOSIS — N179 Acute kidney failure, unspecified: Secondary | ICD-10-CM | POA: Diagnosis not present

## 2023-11-06 DIAGNOSIS — C8400 Mycosis fungoides, unspecified site: Secondary | ICD-10-CM | POA: Diagnosis not present

## 2023-11-06 DIAGNOSIS — I130 Hypertensive heart and chronic kidney disease with heart failure and stage 1 through stage 4 chronic kidney disease, or unspecified chronic kidney disease: Secondary | ICD-10-CM | POA: Diagnosis not present

## 2023-11-06 DIAGNOSIS — N1831 Chronic kidney disease, stage 3a: Secondary | ICD-10-CM | POA: Diagnosis not present

## 2023-11-06 DIAGNOSIS — D631 Anemia in chronic kidney disease: Secondary | ICD-10-CM | POA: Diagnosis not present

## 2023-11-06 DIAGNOSIS — I5021 Acute systolic (congestive) heart failure: Secondary | ICD-10-CM | POA: Diagnosis not present

## 2023-11-13 ENCOUNTER — Telehealth: Payer: Self-pay | Admitting: General Practice

## 2023-11-13 DIAGNOSIS — C8400 Mycosis fungoides, unspecified site: Secondary | ICD-10-CM | POA: Diagnosis not present

## 2023-11-13 DIAGNOSIS — I5021 Acute systolic (congestive) heart failure: Secondary | ICD-10-CM | POA: Diagnosis not present

## 2023-11-13 DIAGNOSIS — I130 Hypertensive heart and chronic kidney disease with heart failure and stage 1 through stage 4 chronic kidney disease, or unspecified chronic kidney disease: Secondary | ICD-10-CM | POA: Diagnosis not present

## 2023-11-13 DIAGNOSIS — N179 Acute kidney failure, unspecified: Secondary | ICD-10-CM | POA: Diagnosis not present

## 2023-11-13 DIAGNOSIS — D631 Anemia in chronic kidney disease: Secondary | ICD-10-CM | POA: Diagnosis not present

## 2023-11-13 DIAGNOSIS — N1831 Chronic kidney disease, stage 3a: Secondary | ICD-10-CM | POA: Diagnosis not present

## 2023-11-13 NOTE — Telephone Encounter (Signed)
 Spoke with daughter Jerryl Morin (OK per DPR) who reports pt HR is staying around 84 bpm she denies any pain, discomfort, shortness of breath, just not up to par.  She reports taking medications as listed.  Advised to continue to monitor - normal HR being between 60-100 bpm and call back if any further concerns.  Daughter states understanding and was appreciative of the call.

## 2023-11-13 NOTE — Telephone Encounter (Signed)
 Caller Josiah Nigh) reported a little bit of nausea, no vomiting.  Caller stated patient's heart rate is normally in the 70-80's but today patient HR was in the mid 90's and slightly irregular.  Caller stated can call patient's daughter Jerryl Morin) at [phone] to follow-up.

## 2023-11-19 ENCOUNTER — Ambulatory Visit: Admitting: Radiology

## 2023-11-20 ENCOUNTER — Ambulatory Visit (INDEPENDENT_AMBULATORY_CARE_PROVIDER_SITE_OTHER): Admitting: Radiology

## 2023-11-20 ENCOUNTER — Encounter: Payer: Self-pay | Admitting: Radiology

## 2023-11-20 VITALS — BP 130/74 | HR 100 | Ht <= 58 in | Wt 119.0 lb

## 2023-11-20 DIAGNOSIS — Z4689 Encounter for fitting and adjustment of other specified devices: Secondary | ICD-10-CM

## 2023-11-20 NOTE — Progress Notes (Signed)
   Subjective:     History of Present Illness: [name] is a 89 y.o. female with stage III pelvic organ prolapse who presents for a pessary check. She is using a size 4 ring with support pessary. The pessary has been working well she does complain of occasional vaginal odor. She is not using vaginal estrogen. She denies vaginal bleeding.  Past Medical History: Patient  has a past medical history of Adrenal myelolipoma, Apical variant hypertrophic cardiomyopathy (HCC), Chronic kidney disease, DIVERTICULOSIS, COLON, Dysmetabolic syndrome X, FIBROMYALGIA, Gastric ulcer due to Helicobacter pylori, Hiatal hernia, cataract surgery (01/08/2016), HYPERLIPIDEMIA, Hyperplastic colon polyp, HYPERTENSION, ESSENTIAL NOS, Hyponatremia, Hypothyroidism, MYCOSIS FUNGOIDES, Nodule of left lung, Normocytic anemia, Olecranon fracture (01/07/2016), OSTEOARTHRITIS, OSTEOPOROSIS, Persistent atrial fibrillation (HCC), Renal angiomyolipoma, and Unspecified vitamin D  deficiency.   Past Surgical History: She  has a past surgical history that includes G 4 P 2; colonoscopy with polypectomy (07/25/2007); ORIF elbow fracture (Right, 01/08/2016); I & D extremity (Right, 01/08/2016); and Cardioversion (N/A, 01/03/2017).   Medications: She has a current medication list which includes the following prescription(s): apixaban , carvedilol , cholecalciferol , ferrous sulfate , levothyroxine , lorazepam , omeprazole , ondansetron , torsemide , and cyanocobalamin .   Allergies: Patient is allergic to achromycin [tetracycline hcl], celecoxib, clarithromycin , ezetimibe-simvastatin , flagyl  [metronidazole ], penicillins, simvastatin , clonazepam , erythromycin, sertraline , tramadol , and abilify  [aripiprazole ].       Objective:    Physical Exam: BP 130/74 (BP Location: Left Arm, Cuff Size: Normal)   Pulse 100   Ht 4' 10 (1.473 m)   Wt 119 lb (54 kg)   LMP  (LMP Unknown) Comment: not sexually  SpO2 99%   BMI 24.87 kg/m  Gen: No apparent  distress, A&O x 3. Detailed Urogynecologic Evaluation:  Pelvic Exam: Normal external female genitalia; Bartholin's and Skene's glands normal in appearance; urethral meatus normal in appearance, no urethral masses + discharge. The pessary was noted to be in place. It was removed and cleaned. Speculum exam revealed no lesions in the vagina. The pessary was replaced. It was comfortable to the patient and fit well.   .  Assessment/Plan:    1. Pessary maintenance  She will keep the pessary in place until next visit.  She will follow-up in 3 months for a pessary check or sooner as needed.  All questions were answered.  Neziah Braley B, NP 2:59 PM

## 2023-11-21 DIAGNOSIS — N179 Acute kidney failure, unspecified: Secondary | ICD-10-CM | POA: Diagnosis not present

## 2023-11-21 DIAGNOSIS — I130 Hypertensive heart and chronic kidney disease with heart failure and stage 1 through stage 4 chronic kidney disease, or unspecified chronic kidney disease: Secondary | ICD-10-CM | POA: Diagnosis not present

## 2023-11-21 DIAGNOSIS — I5021 Acute systolic (congestive) heart failure: Secondary | ICD-10-CM | POA: Diagnosis not present

## 2023-11-21 DIAGNOSIS — N1831 Chronic kidney disease, stage 3a: Secondary | ICD-10-CM | POA: Diagnosis not present

## 2023-11-21 DIAGNOSIS — C8400 Mycosis fungoides, unspecified site: Secondary | ICD-10-CM | POA: Diagnosis not present

## 2023-11-21 DIAGNOSIS — D631 Anemia in chronic kidney disease: Secondary | ICD-10-CM | POA: Diagnosis not present

## 2023-11-22 ENCOUNTER — Other Ambulatory Visit: Payer: Self-pay | Admitting: Internal Medicine

## 2023-11-28 ENCOUNTER — Telehealth: Payer: Self-pay | Admitting: Internal Medicine

## 2023-11-28 DIAGNOSIS — C8400 Mycosis fungoides, unspecified site: Secondary | ICD-10-CM | POA: Diagnosis not present

## 2023-11-28 DIAGNOSIS — D631 Anemia in chronic kidney disease: Secondary | ICD-10-CM | POA: Diagnosis not present

## 2023-11-28 DIAGNOSIS — I130 Hypertensive heart and chronic kidney disease with heart failure and stage 1 through stage 4 chronic kidney disease, or unspecified chronic kidney disease: Secondary | ICD-10-CM | POA: Diagnosis not present

## 2023-11-28 DIAGNOSIS — N179 Acute kidney failure, unspecified: Secondary | ICD-10-CM | POA: Diagnosis not present

## 2023-11-28 DIAGNOSIS — N1831 Chronic kidney disease, stage 3a: Secondary | ICD-10-CM | POA: Diagnosis not present

## 2023-11-28 DIAGNOSIS — I5021 Acute systolic (congestive) heart failure: Secondary | ICD-10-CM | POA: Diagnosis not present

## 2023-11-28 NOTE — Telephone Encounter (Signed)
 Noted

## 2023-11-28 NOTE — Telephone Encounter (Signed)
 Copied from CRM [id number]. Topic: General - Other >> Nov 28, 2023  2:18 PM Ernestene P wrote: Reason for CRM: Signe- Pt advise pt Is doing well and is being discharged from home health services. Any questions he can be reached [phone]

## 2023-12-17 ENCOUNTER — Other Ambulatory Visit: Payer: Self-pay | Admitting: Internal Medicine

## 2023-12-29 ENCOUNTER — Other Ambulatory Visit: Payer: Self-pay | Admitting: Internal Medicine

## 2024-01-08 NOTE — Progress Notes (Unsigned)
 Cardiology Office Note:    Date:  01/09/2024   ID:  [name], DOB 04/12/1928, MRN [id number]  PCP:  Geofm Glade PARAS, MD   [facility] Providers Cardiologist:  None { Click to update primary MD,subspecialty MD or APP then REFRESH:1}    Referring MD: Geofm Glade PARAS, MD    History of Present Illness:    [name] is a 89 y.o. female with a hx of permanent atrial fibrillation, adrenal myelolipoma, apical variant hypertrophic cardiomyopathy, normocytic anemia, hypertension, aortic atherosclerosis, hyperlipidemia, hypothyroidism, CKD stage III, GERD, hiatal hernia, presents to the office today for a 75-month follow-up.  She was admitted to the hospital on 09/27/2023 for an episode of an acute systolic CHF exacerbation and pleural effusions where she improved following diuresis.  Her echo at that time showed a mildly reduced LVEF of 45-50% with apical RWMA.  Conservative therapies were pursued due to her advanced age and AKI.  She was not started on an ACE, ARNI, ARB, or SGLT2 inhibitor.  Patient is intolerant to statins, causes muscle aches, did not pursue further treatment of lipids at that time.  Her outpatient follow-up visit in May 2025, she had been increasing her physical activity, reported difficulty with her appetite but able to maintain her weight and was instructed to increase physical activity as tolerated.  Patient is accompanied by her daughter [name] and they are in office visit today.  Savon has gained 4 pounds since her previous visit in May and states that she is feeling better.  The patient and her daughter both confirm that she has been able to increase her oral intake in comparison to her previous visit, states that her appetite has returned, and overall states she feels much better.  She states that she does occasionally still feel a little nauseous after eating, but states it resolves shortly after.  Patient lives alone, her daughter [name] comes over daily to  help her with activities of daily living including grocery shopping and cooking.  Patient uses a walker and is able to dress and bathe herself, she is also able to perform light cleaning duties around the house like dusting and washing the dishes.  Her daughter manages her medications and states that she has been taking them without missing doses, tolerating well.  States occasionally after hot shower she does feel mildly short of breath, but this passes after sitting down for few minutes.  Daughter and patient agree that she has not had any leg swelling aside from mild dependent 1+ edema bilateral feet, reports that she is still using the torsemide  and tolerating it well.  Did report dark stools but attributes this to her regular iron  supplement.  Denies orthopnea, chest pain, shortness of breath, palpitations, vomiting, bloody stools/vomit, dizziness, near-syncope, numbness, tingling.  The patient does complain of still feeling some mild fatigue consistently on a daily basis, is on a vitamin B12 supplement that is managed by her PCP, as well as iron  and Synthroid . Will order a CBC, BMP, TSH in office today. Patient stated that her friend sees Dr. Jeffrie and is requesting to change her primary cardiologist to Dr. Jeffrie, will set up follow-up appointment in 4 months.   Past Medical History:  Diagnosis Date   Adrenal myelolipoma    Apical variant hypertrophic cardiomyopathy (HCC)    Echo 4/18: Normal LV size with marked apical hypertrophy. LVEF 60-65%. No WMA. Mild MR. Moderate left atrial enlargement. Normal RV size and function. Mild right atrial enlargemen  Chronic kidney disease    DIVERTICULOSIS, COLON    Dysmetabolic syndrome X    FIBROMYALGIA    Gastric ulcer due to Helicobacter pylori    Hiatal hernia    Hx of cataract surgery 01/08/2016   HYPERLIPIDEMIA    Hyperplastic colon polyp    HYPERTENSION, ESSENTIAL NOS    Hyponatremia    Hypothyroidism    MYCOSIS FUNGOIDES    Nodule of left lung     Normocytic anemia    Olecranon fracture 01/07/2016   OSTEOARTHRITIS    OSTEOPOROSIS    Persistent atrial fibrillation (HCC)    s/p DCCV 2018 // Apixaban  for anticoag   Renal angiomyolipoma    Unspecified vitamin D  deficiency     Past Surgical History:  Procedure Laterality Date   CARDIOVERSION N/A 01/03/2017   Procedure: CARDIOVERSION;  Surgeon: Delford Maude BROCKS, MD;  Location: [facility] ENDOSCOPY;  Service: Cardiovascular;  Laterality: N/A;   colonoscopy with polypectomy  07/25/2007   Dr Obie, Recheck in 7 years for 2009   G 4 P 2     I & D EXTREMITY Right 01/08/2016   Procedure: IRRIGATION AND DEBRIDEMENT OF OLECRANON FRACTURE;  Surgeon: Evalene JONETTA Chancy, MD;  Location[facility] OR;  Service: Orthopedics;  Laterality: Right;   ORIF ELBOW FRACTURE Right 01/08/2016   Procedure: OPEN REDUCTION INTERNAL FIXATION (ORIF) ELBOW/OLECRANON FRACTURE, NEUROLYSIS;  Surgeon: Evalene JONETTA Chancy, MD;  Location: [facility] OR;  Service: Orthopedics;  Laterality: Right;    Current Medications: Current Meds  Medication Sig   carvedilol  (COREG ) 3.125 MG tablet TAKE 1 TABLET BY MOUTH 2 TIMES DAILY WITH A MEAL   Cholecalciferol  50 MCG (2000 UT) CAPS Take 1 capsule (2,000 Units total) by mouth daily.   ELIQUIS  2.5 MG TABS tablet TAKE 1 TABLET BY MOUTH 2 TIMES DAILY   ferrous sulfate  325 (65 FE) MG EC tablet Take 1 tablet (325 mg total) by mouth every other day. Patient switching to OTC slow release form   levothyroxine  (SYNTHROID ) 75 MCG tablet TAKE 1 TABLET BY MOUTH DAILY EXCEPT TAKE 1/2 TABLET ON TUESDAYS, THURSDAY AND SATURDAY   LORazepam  (ATIVAN ) 0.5 MG tablet TAKE 1/2 TABLET BY MOUTH DAILY AS NEEDED FOR ANXIETY   omeprazole  (PRILOSEC) 40 MG capsule TAKE 1 CAPSULE BY MOUTH EVERY DAY   ondansetron  (ZOFRAN -ODT) 4 MG disintegrating tablet TAKE 1 TABLET BY MOUTH EVERY 8 HOURS AS NEEDED FOR NAUSEA AND VOMITING   torsemide  (DEMADEX ) 10 MG tablet TALE 1 TABLET BY MOUTH EVERY OTHER DAY AS NEEDED FOR SWELLING,SHORTNESS OF BREATHOR  WT GAIN (>3LBS IN 1 DAY OR 5LBS IN 1 WEEK)   vitamin B-12 (CYANOCOBALAMIN ) 1000 MCG tablet Take 1 tablet (1,000 mcg total) by mouth daily.     Allergies:   Achromycin [tetracycline hcl], Celecoxib, Clarithromycin , Ezetimibe-simvastatin , Flagyl  [metronidazole ], Penicillins, Simvastatin , Clonazepam , Erythromycin, Sertraline , Tramadol , and Abilify  [aripiprazole ]   Social History   Socioeconomic History   Marital status: Widowed    Spouse name: Not on file   Number of children: 2   Years of education: Not on file   Highest education level: Not on file  Occupational History   Occupation: Retired    Associate Professor: RETIRED  Tobacco Use   Smoking status: Former    Current packs/day: 0.00    Average packs/day: 0.5 packs/day for 14.0 years (7.0 ttl pk-yrs)    Types: Cigarettes    Start date: 66    Quit date: 1956    Years since quitting: 69.6   Smokeless tobacco: Never  Tobacco comments:    smoked 1951-1965 , up to 1/2 ppd  Vaping Use   Vaping status: Never Used  Substance and Sexual Activity   Alcohol use: No   Drug use: No   Sexual activity: Not Currently    Partners: Male    Birth control/protection: Post-menopausal  Other Topics Concern   Not on file  Social History Narrative   Widowed.  Lives alone.  Ambulates without assistance.  Steady on feet.   Social Drivers of Corporate investment banker Strain: Not on file  Food Insecurity: No Food Insecurity (09/28/2023)   Hunger Vital Sign    Worried About Running Out of Food in the Last Year: Never true    Ran Out of Food in the Last Year: Never true  Transportation Needs: No Transportation Needs (09/28/2023)   PRAPARE - Administrator, Civil Service (Medical): No    Lack of Transportation (Non-Medical): No  Physical Activity: Not on file  Stress: Not on file  Social Connections: Socially Isolated (09/28/2023)   Social Connection and Isolation Panel    Frequency of Communication with Friends and Family: Twice a week     Frequency of Social Gatherings with Friends and Family: Twice a week    Attends Religious Services: Never    Database administrator or Organizations: No    Attends Banker Meetings: Never    Marital Status: Widowed     Family History: The patient's family history includes Diabetes in her mother; Heart attack (age of onset: 94) in her maternal uncle; Hypertension in her maternal grandmother; Stroke (age of onset: 29) in her maternal grandmother.  ROS:   Please see the history of present illness.     All other systems reviewed and are negative.  EKGs/Labs/Other Studies Reviewed:    The following studies were reviewed today:   Recent Labs: 09/30/2023: B Natriuretic Peptide 575.4 10/07/2023: ALT 8; BUN 27; Creatinine, Ser 1.54; Hemoglobin 9.7; Platelets 310.0; Potassium 4.6; Pro B Natriuretic peptide (BNP) 1,131.0; Sodium 138; TSH 2.58  Recent Lipid Panel    Component Value Date/Time   CHOL 188 01/21/2023 1406   TRIG 164.0 (H) 01/21/2023 1406   HDL 46.10 01/21/2023 1406   CHOLHDL 4 01/21/2023 1406   VLDL 32.8 01/21/2023 1406   LDLCALC 109 (H) 01/21/2023 1406   LDLDIRECT 128.9 09/09/2012 1129     Risk Assessment/Calculations:    CHA2DS2-VASc Score = 6  {Confirm score is correct.  If not, click here to update score.  REFRESH note.  :1} This indicates a 9.7% annual risk of stroke. The patient's score is based upon: CHF History: 1 HTN History: 1 Diabetes History: 0 Stroke History: 0 Vascular Disease History: 1 Age Score: 2 Gender Score: 1   {This patient has a significant risk of stroke if diagnosed with atrial fibrillation.  Please consider VKA or DOAC agent for anticoagulation if the bleeding risk is acceptable.   You can also use the SmartPhrase .[email or web address] for documentation.   :[id number]}   Physical Exam:    VS:  BP 130/60   Pulse 84   Ht 4' 10 (1.473 m)   Wt 119 lb (54 kg)   LMP  (LMP Unknown) Comment: not sexually  SpO2 98%   BMI 24.87 kg/m      Wt Readings from Last 3 Encounters:  01/09/24 119 lb (54 kg)  11/20/23 119 lb (54 kg)  10/10/23 115 lb (52.2 kg)     GEN:  Well  nourished, well developed in no acute distress HEENT: Normal NECK: No carotid bruits LYMPHATICS: No lymphadenopathy CARDIAC: S1-S2 normal, irregularly irregular, no murmurs, rubs, gallops, radial pulses 2+ bilaterally, PT pulses 1+ bilaterally RESPIRATORY:  Clear to auscultation without rales, wheezing or rhonchi  MUSCULOSKELETAL: 1+ edema bilateral feet, not extending into ankles, family reports is baseline for pt. No deformity  SKIN: Warm and dry NEUROLOGIC:  Alert and oriented x 3 PSYCHIATRIC:  Normal affect   ASSESSMENT:    1. Apical variant hypertrophic cardiomyopathy (HCC)   2. Persistent atrial fibrillation (HCC)   3. Essential hypertension   4. Iron  deficiency anemia, unspecified iron  deficiency anemia type   5. Other fatigue    PLAN:    In order of problems listed above:  - Apical variant hypertrophic cardiomyopathy -Echo 4/26: LV EF is 40 to 45% with mildly decreased function and RWMA.  Severe asymmetric left ventricular hypertrophy of the apical segment. Severe dilation of the left atrium.  - Continue carvedilol  3.125 mg BID  - Continue torsemide  10 mg every other day PRN  - Continue lisinopril ***  -Persistent atrial fibrillation,  - Continue Eliquis  2.5 mg BID  - Essential hypertension  - BP 130/60 in office today  - Continue carvedilol  and lisinopril   - Iron  deficiency anemia  - Takes extended release iron  supplement every other day, advised to continue   - Hgb 9.7 in May, up from 7.9 during hospitalization in April  - Will recheck CBC   5.  - Fatigue  - Hx of hypothyroidism, on synthroid , will recheck TSH and BMP     Medication Adjustments/Labs and Tests Ordered: Current medicines are reviewed at length with the patient today.  Concerns regarding medicines are outlined above.  Orders Placed This Encounter  Procedures    Basic Metabolic Panel (BMET)   CBC   Thyroid  Panel With TSH   No orders of the defined types were placed in this encounter.   Patient Instructions  Medication Instructions:  Your physician recommends that you continue on your current medications as directed. Please refer to the Current Medication list given to you today.  *If you need a refill on your cardiac medications before your next appointment, please call your pharmacy*  Lab Work: TODAY: BMET, CBC, THYROID  PANEL If you have labs (blood work) drawn today and your tests are completely normal, you will receive your results only by: MyChart Message (if you have MyChart) OR A paper copy in the mail If you have any lab test that is abnormal or we need to change your treatment, we will call you to review the results.  Testing/Procedures: NONE  Follow-Up: At [facility], you and your health needs are our priority.  As part of our continuing mission to provide you with exceptional heart care, our providers are all part of one team.  This team includes your primary Cardiologist (physician) and Advanced Practice Providers or APPs (Physician Assistants and Nurse Practitioners) who all work together to provide you with the care you need, when you need it.  Your next appointment:   4 month(s)  Provider:   DR. JEFFRIE   We recommend signing up for the patient portal called MyChart.  Sign up information is provided on this After Visit Summary.  MyChart is used to connect with patients for Virtual Visits (Telemedicine).  Patients are able to view lab/test results, encounter notes, upcoming appointments, etc.  Non-urgent messages can be sent to your provider as well.   To learn more about  what you can do with MyChart, go to [email or web address].         Signed, Miriam FORBES Shams, NP  01/09/2024 5:18 PM    [facility]

## 2024-01-08 NOTE — Progress Notes (Unsigned)
 Error

## 2024-01-09 ENCOUNTER — Ambulatory Visit: Attending: General Practice | Admitting: Emergency Medicine

## 2024-01-09 ENCOUNTER — Encounter: Payer: Self-pay | Admitting: General Practice

## 2024-01-09 VITALS — BP 130/60 | HR 84 | Ht <= 58 in | Wt 119.0 lb

## 2024-01-09 DIAGNOSIS — I4819 Other persistent atrial fibrillation: Secondary | ICD-10-CM

## 2024-01-09 DIAGNOSIS — D509 Iron deficiency anemia, unspecified: Secondary | ICD-10-CM | POA: Insufficient documentation

## 2024-01-09 DIAGNOSIS — R5383 Other fatigue: Secondary | ICD-10-CM | POA: Insufficient documentation

## 2024-01-09 DIAGNOSIS — I422 Other hypertrophic cardiomyopathy: Secondary | ICD-10-CM | POA: Insufficient documentation

## 2024-01-09 DIAGNOSIS — I10 Essential (primary) hypertension: Secondary | ICD-10-CM | POA: Diagnosis not present

## 2024-01-09 DIAGNOSIS — I4821 Permanent atrial fibrillation: Secondary | ICD-10-CM | POA: Insufficient documentation

## 2024-01-09 NOTE — Patient Instructions (Signed)
 Medication Instructions:  Your physician recommends that you continue on your current medications as directed. Please refer to the Current Medication list given to you today.  *If you need a refill on your cardiac medications before your next appointment, please call your pharmacy*  Lab Work: TODAY: BMET, CBC, THYROID  PANEL If you have labs (blood work) drawn today and your tests are completely normal, you will receive your results only by: MyChart Message (if you have MyChart) OR A paper copy in the mail If you have any lab test that is abnormal or we need to change your treatment, we will call you to review the results.  Testing/Procedures: NONE  Follow-Up: At [facility], you and your health needs are our priority.  As part of our continuing mission to provide you with exceptional heart care, our providers are all part of one team.  This team includes your primary Cardiologist (physician) and Advanced Practice Providers or APPs (Physician Assistants and Nurse Practitioners) who all work together to provide you with the care you need, when you need it.  Your next appointment:   4 month(s)  Provider:   DR. JEFFRIE   We recommend signing up for the patient portal called MyChart.  Sign up information is provided on this After Visit Summary.  MyChart is used to connect with patients for Virtual Visits (Telemedicine).  Patients are able to view lab/test results, encounter notes, upcoming appointments, etc.  Non-urgent messages can be sent to your provider as well.   To learn more about what you can do with MyChart, go to [email or web address].

## 2024-01-10 ENCOUNTER — Encounter: Payer: Self-pay | Admitting: Emergency Medicine

## 2024-01-10 ENCOUNTER — Ambulatory Visit: Payer: Self-pay | Admitting: Emergency Medicine

## 2024-01-10 LAB — CBC
Hematocrit: 33.3 % — ABNORMAL LOW (ref 34.0–46.6)
Hemoglobin: 10.2 g/dL — ABNORMAL LOW (ref 11.1–15.9)
MCH: 28.3 pg (ref 26.6–33.0)
MCHC: 30.6 g/dL — ABNORMAL LOW (ref 31.5–35.7)
MCV: 92 fL (ref 79–97)
Platelets: 263 x10E3/uL (ref 150–450)
RBC: 3.61 x10E6/uL — ABNORMAL LOW (ref 3.77–5.28)
RDW: 14.3 % (ref 11.7–15.4)
WBC: 6.3 x10E3/uL (ref 3.4–10.8)

## 2024-01-10 LAB — BASIC METABOLIC PANEL WITH GFR
BUN/Creatinine Ratio: 13 (ref 12–28)
BUN: 15 mg/dL (ref 10–36)
CO2: 21 mmol/L (ref 20–29)
Calcium: 9.1 mg/dL (ref 8.7–10.3)
Chloride: 103 mmol/L (ref 96–106)
Creatinine, Ser: 1.16 mg/dL — ABNORMAL HIGH (ref 0.57–1.00)
Glucose: 87 mg/dL (ref 70–99)
Potassium: 4.4 mmol/L (ref 3.5–5.2)
Sodium: 141 mmol/L (ref 134–144)
eGFR: 44 mL/min/1.73 — ABNORMAL LOW (ref >59)

## 2024-01-10 LAB — THYROID PANEL WITH TSH
Free Thyroxine Index: 2.2 (ref 1.2–4.9)
T3 Uptake Ratio: 29 % (ref 24–39)
T4, Total: 7.7 ug/dL (ref 4.5–12.0)
TSH: 2.69 u[IU]/mL (ref 0.450–4.500)

## 2024-01-12 ENCOUNTER — Other Ambulatory Visit: Payer: Self-pay | Admitting: Internal Medicine

## 2024-01-20 ENCOUNTER — Other Ambulatory Visit: Payer: Self-pay

## 2024-01-20 ENCOUNTER — Telehealth: Payer: Self-pay

## 2024-01-20 MED ORDER — OMEPRAZOLE 40 MG PO CPDR: DELAYED_RELEASE_CAPSULE | ORAL | 2 refills | Status: AC

## 2024-01-20 NOTE — Telephone Encounter (Signed)
 Copied from CRM #[id number]. Topic: Clinical - Prescription Issue >> Jan 17, 2024  2:16 PM Berneda FALCON wrote: Reason for CRM: Daughter [name] states that this medication should be 2X per day but RX is only listed for once per day. We need to get this updated for her please and go ahead and get a refill if possible please.  omeprazole  (PRILOSEC) 40 MG capsule  [id number], [place] - [address], [place] [address] Phone: [phone]  Fax: [phone] DEA #: [id number] DAW Reason: --

## 2024-01-20 NOTE — Telephone Encounter (Signed)
 New script sent and message left for [name] on VM today.

## 2024-02-05 ENCOUNTER — Ambulatory Visit (INDEPENDENT_AMBULATORY_CARE_PROVIDER_SITE_OTHER): Admitting: Radiology

## 2024-02-05 ENCOUNTER — Encounter: Payer: Self-pay | Admitting: Radiology

## 2024-02-05 VITALS — BP 126/68

## 2024-02-05 DIAGNOSIS — N8110 Cystocele, unspecified: Secondary | ICD-10-CM

## 2024-02-05 DIAGNOSIS — Z4689 Encounter for fitting and adjustment of other specified devices: Secondary | ICD-10-CM

## 2024-02-05 NOTE — Progress Notes (Signed)
   Subjective:     History of Present Illness: [name] is a 89 y.o. female with stage III pelvic organ prolapse who presents for a pessary check. She is using a size 4 ring with support pessary. The pessary has been working well. She is not using vaginal estrogen. She denies vaginal bleeding.  Past Medical History: Patient  has a past medical history of Adrenal myelolipoma, Apical variant hypertrophic cardiomyopathy (HCC), Chronic kidney disease, DIVERTICULOSIS, COLON, Dysmetabolic syndrome X, FIBROMYALGIA, Gastric ulcer due to Helicobacter pylori, Hiatal hernia, cataract surgery (01/08/2016), HYPERLIPIDEMIA, Hyperplastic colon polyp, HYPERTENSION, ESSENTIAL NOS, Hyponatremia, Hypothyroidism, MYCOSIS FUNGOIDES, Nodule of left lung, Normocytic anemia, Olecranon fracture (01/07/2016), OSTEOARTHRITIS, OSTEOPOROSIS, Persistent atrial fibrillation (HCC), Renal angiomyolipoma, and Unspecified vitamin D  deficiency.   Past Surgical History: She  has a past surgical history that includes G 4 P 2; colonoscopy with polypectomy (07/25/2007); ORIF elbow fracture (Right, 01/08/2016); I & D extremity (Right, 01/08/2016); and Cardioversion (N/A, 01/03/2017).   Medications: She has a current medication list which includes the following prescription(s): carvedilol , cholecalciferol , eliquis , ferrous sulfate , levothyroxine , lorazepam , omeprazole , ondansetron , torsemide , and cyanocobalamin .   Allergies: Patient is allergic to achromycin [tetracycline hcl], celecoxib, clarithromycin , ezetimibe-simvastatin , flagyl  [metronidazole ], penicillins, simvastatin , clonazepam , erythromycin, sertraline , tramadol , and abilify  [aripiprazole ].       Objective:    Physical Exam: BP 126/68 (BP Location: Left Arm, Patient Position: Sitting, Cuff Size: Normal)   LMP  (LMP Unknown) Comment: not sexually Gen: No apparent distress, A&O x 3. Detailed Urogynecologic Evaluation:  Pelvic Exam: Normal external female genitalia;  Bartholin's and Skene's glands normal in appearance; urethral meatus normal in appearance, no urethral masses + discharge. The pessary was noted to be in place. It was removed and cleaned. Speculum exam revealed no lesions in the vagina. The pessary was replaced. It was comfortable to the patient and fit well.   .  Assessment/Plan:    1. Pessary maintenance  She will keep the pessary in place until next visit.  She will follow-up in 3 months for a pessary check or sooner as needed.  All questions were answered.  Shondale Quinley B, NP 2:02 PM

## 2024-02-18 ENCOUNTER — Ambulatory Visit: Admitting: Radiology

## 2024-03-15 ENCOUNTER — Other Ambulatory Visit: Payer: Self-pay | Admitting: Internal Medicine

## 2024-03-17 ENCOUNTER — Other Ambulatory Visit: Payer: Self-pay | Admitting: Internal Medicine

## 2024-03-28 ENCOUNTER — Other Ambulatory Visit: Payer: Self-pay | Admitting: Internal Medicine

## 2024-04-07 NOTE — Progress Notes (Unsigned)
 Subjective:    Patient ID: [name], female    DOB: Mar 04, 1928, 89 y.o.   MRN: [id number]     HPI [name] is here for follow up of her chronic medical problems.  She is here with her daughter.  She has no concerns.    Medications and allergies reviewed with patient and updated if appropriate.  Current Outpatient Medications on File Prior to Visit  Medication Sig Dispense Refill   carvedilol  (COREG ) 3.125 MG tablet TAKE 1 TABLET BY MOUTH TWICE A DAY WITH FOOD 180 tablet 1   Cholecalciferol  50 MCG (2000 UT) CAPS Take 1 capsule (2,000 Units total) by mouth daily. 30 capsule    ELIQUIS  2.5 MG TABS tablet TAKE 1 TABLET BY MOUTH 2 TIMES DAILY 60 tablet 1   ferrous sulfate  325 (65 FE) MG EC tablet Take 1 tablet (325 mg total) by mouth every other day. Patient switching to OTC slow release form     levothyroxine  (SYNTHROID ) 75 MCG tablet TAKE 1 TABLET BY MOUTH DAILY EXCEPT TAKE 1/2 TABLET ON TUESDAYS, THURSDAY AND SATURDAY 66 tablet 1   LORazepam  (ATIVAN ) 0.5 MG tablet TAKE 1/2 TABLET BY MOUTH DAILY AS NEEDED FOR ANXIETY 30 tablet 1   omeprazole  (PRILOSEC) 40 MG capsule Take one tablet by mouth two times daily as needed 180 capsule 2   ondansetron  (ZOFRAN -ODT) 4 MG disintegrating tablet TAKE 1 TABLET BY MOUTH EVERY 8 HOURS AS NEEDED FOR NAUSEA AND VOMITING 20 tablet 1   torsemide  (DEMADEX ) 10 MG tablet TALE 1 TABLET BY MOUTH EVERY OTHER DAY AS NEEDED FOR SWELLING,SHORTNESS OF BREATHOR WT GAIN (>3LBS IN 1 DAY OR 5LBS IN 1 WEEK) 45 tablet 1   vitamin B-12 (CYANOCOBALAMIN ) 1000 MCG tablet Take 1 tablet (1,000 mcg total) by mouth daily.     No current facility-administered medications on file prior to visit.     Review of Systems  Constitutional:  Negative for fever.  Respiratory:  Negative for cough. Shortness of breath: after showering or doing a lot.  Cardiovascular:  Positive for leg swelling (mild). Negative for chest pain and palpitations.  Gastrointestinal:  Positive  for nausea (occasonal). Negative for abdominal pain.  Neurological:  Negative for light-headedness and headaches.       Objective:   Vitals:   04/08/24 1503  BP: 124/66  Pulse: 80  Temp: 98 F (36.7 C)  SpO2: 97%   BP Readings from Last 3 Encounters:  04/08/24 124/66  02/05/24 126/68  01/09/24 130/60   Wt Readings from Last 3 Encounters:  04/08/24 127 lb (57.6 kg)  01/09/24 119 lb (54 kg)  11/20/23 119 lb (54 kg)   Body mass index is 26.54 kg/m.    Physical Exam Constitutional:      General: She is not in acute distress.    Appearance: Normal appearance.  HENT:     Head: Normocephalic and atraumatic.  Eyes:     Conjunctiva/sclera: Conjunctivae normal.  Cardiovascular:     Rate and Rhythm: Normal rate and regular rhythm.     Heart sounds: Murmur heard.  Pulmonary:     Effort: Pulmonary effort is normal. No respiratory distress.     Breath sounds: Normal breath sounds. No wheezing.  Musculoskeletal:     Cervical back: Neck supple.     Right lower leg: Edema (mild - feet) present.     Left lower leg: Edema (mild - feet) present.  Lymphadenopathy:     Cervical: No cervical adenopathy.  Skin:    General: Skin is warm and dry.     Findings: No rash.  Neurological:     Mental Status: She is alert. Mental status is at baseline.  Psychiatric:        Mood and Affect: Mood normal.        Behavior: Behavior normal.        Lab Results  Component Value Date   WBC 6.3 01/09/2024   HGB 10.2 (L) 01/09/2024   HCT 33.3 (L) 01/09/2024   PLT 263 01/09/2024   GLUCOSE 87 01/09/2024   CHOL 188 01/21/2023   TRIG 164.0 (H) 01/21/2023   HDL 46.10 01/21/2023   LDLDIRECT 128.9 09/09/2012   LDLCALC 109 (H) 01/21/2023   ALT 8 10/07/2023   AST 14 10/07/2023   NA 141 01/09/2024   K 4.4 01/09/2024   CL 103 01/09/2024   CREATININE 1.16 (H) 01/09/2024   BUN 15 01/09/2024   CO2 21 01/09/2024   TSH 2.690 01/09/2024   INR 1.1 12/31/2016   HGBA1C 5.2 12/29/2019    MICROALBUR 0.2 04/24/2006     Assessment & Plan:    See Problem List for Assessment and Plan of chronic medical problems.

## 2024-04-07 NOTE — Patient Instructions (Addendum)
   Flu immunization administered today.     Blood work was ordered.       Medications changes include :   None     Return in about 6 months (around 10/06/2024) for follow up.

## 2024-04-08 ENCOUNTER — Encounter: Payer: Self-pay | Admitting: Internal Medicine

## 2024-04-08 ENCOUNTER — Ambulatory Visit (INDEPENDENT_AMBULATORY_CARE_PROVIDER_SITE_OTHER): Admitting: Internal Medicine

## 2024-04-08 VITALS — BP 124/66 | HR 80 | Temp 98.0°F | Ht <= 58 in | Wt 127.0 lb

## 2024-04-08 DIAGNOSIS — I5022 Chronic systolic (congestive) heart failure: Secondary | ICD-10-CM | POA: Diagnosis not present

## 2024-04-08 DIAGNOSIS — E559 Vitamin D deficiency, unspecified: Secondary | ICD-10-CM | POA: Diagnosis not present

## 2024-04-08 DIAGNOSIS — Z23 Encounter for immunization: Secondary | ICD-10-CM | POA: Diagnosis not present

## 2024-04-08 DIAGNOSIS — I4819 Other persistent atrial fibrillation: Secondary | ICD-10-CM | POA: Diagnosis not present

## 2024-04-08 DIAGNOSIS — K219 Gastro-esophageal reflux disease without esophagitis: Secondary | ICD-10-CM

## 2024-04-08 DIAGNOSIS — R110 Nausea: Secondary | ICD-10-CM

## 2024-04-08 DIAGNOSIS — F419 Anxiety disorder, unspecified: Secondary | ICD-10-CM | POA: Diagnosis not present

## 2024-04-08 DIAGNOSIS — I10 Essential (primary) hypertension: Secondary | ICD-10-CM

## 2024-04-08 DIAGNOSIS — E038 Other specified hypothyroidism: Secondary | ICD-10-CM | POA: Diagnosis not present

## 2024-04-08 DIAGNOSIS — D509 Iron deficiency anemia, unspecified: Secondary | ICD-10-CM

## 2024-04-08 DIAGNOSIS — N1832 Chronic kidney disease, stage 3b: Secondary | ICD-10-CM

## 2024-04-08 LAB — COMPREHENSIVE METABOLIC PANEL WITH GFR
ALT: 6 U/L (ref 0–35)
AST: 13 U/L (ref 0–37)
Albumin: 4 g/dL (ref 3.5–5.2)
Alkaline Phosphatase: 64 U/L (ref 39–117)
BUN: 23 mg/dL (ref 6–23)
CO2: 28 meq/L (ref 19–32)
Calcium: 9.1 mg/dL (ref 8.4–10.5)
Chloride: 105 meq/L (ref 96–112)
Creatinine, Ser: 1.24 mg/dL — ABNORMAL HIGH (ref 0.40–1.20)
GFR: 37.34 mL/min — ABNORMAL LOW (ref >60.00)
Glucose, Bld: 87 mg/dL (ref 70–99)
Potassium: 4.4 meq/L (ref 3.5–5.1)
Sodium: 141 meq/L (ref 135–145)
Total Bilirubin: 0.3 mg/dL (ref 0.2–1.2)
Total Protein: 6.8 g/dL (ref 6.0–8.3)

## 2024-04-08 LAB — CBC WITH DIFFERENTIAL/PLATELET
Basophils Absolute: 0.1 K/uL (ref 0.0–0.1)
Basophils Relative: 0.7 % (ref 0.0–3.0)
Eosinophils Absolute: 0.1 K/uL (ref 0.0–0.7)
Eosinophils Relative: 1.3 % (ref 0.0–5.0)
HCT: 31.2 % — ABNORMAL LOW (ref 36.0–46.0)
Hemoglobin: 9.9 g/dL — ABNORMAL LOW (ref 12.0–15.0)
Lymphocytes Relative: 22.8 % (ref 12.0–46.0)
Lymphs Abs: 1.8 K/uL (ref 0.7–4.0)
MCHC: 31.9 g/dL (ref 30.0–36.0)
MCV: 87 fl (ref 78.0–100.0)
Monocytes Absolute: 0.7 K/uL (ref 0.1–1.0)
Monocytes Relative: 8.7 % (ref 3.0–12.0)
Neutro Abs: 5.2 K/uL (ref 1.4–7.7)
Neutrophils Relative %: 66.5 % (ref 43.0–77.0)
Platelets: 280 K/uL (ref 150.0–400.0)
RBC: 3.58 Mil/uL — ABNORMAL LOW (ref 3.87–5.11)
RDW: 15.8 % — ABNORMAL HIGH (ref 11.5–15.5)
WBC: 7.9 K/uL (ref 4.0–10.5)

## 2024-04-08 LAB — IBC PANEL
Iron: 27 ug/dL — ABNORMAL LOW (ref 42–145)
Saturation Ratios: 6.9 % — ABNORMAL LOW (ref 20.0–50.0)
TIBC: 389.2 ug/dL (ref 250.0–450.0)
Transferrin: 278 mg/dL (ref 212.0–360.0)

## 2024-04-08 NOTE — Assessment & Plan Note (Signed)
Chronic Overall controlled Continue lorazepam 0.25 mg daily as needed-she does not take this on a daily basis

## 2024-04-08 NOTE — Assessment & Plan Note (Signed)
 Chronic BP well controlled Cmp, cbc Continue coreg  3.125 mg bid

## 2024-04-08 NOTE — Assessment & Plan Note (Signed)
 Chronic Anemia thought to be acute on chronic normocytic anemia-iron  deficiency anemia Currently taking iron  every other day CBC, iron  panel

## 2024-04-08 NOTE — Assessment & Plan Note (Signed)
 Chronic CKD stage 3b-kidney function has varied over the past year and has actually ranged from stage 3a- 4 Appears to be euvolemic CMP, CBC Continue torsemide  every other day as needed

## 2024-04-08 NOTE — Assessment & Plan Note (Signed)
 Chronic Last echo EF 40-45%, severe asymmetric LVH of apical segments Appears euvolemic Following with cardiology Continue coreg  3.125 mg bid, torsemide  10 mg every other day as needed

## 2024-04-08 NOTE — Assessment & Plan Note (Signed)
 Chronic Euthyroid Check tsh and will titrate med dose if needed Continue levothyroxine  75 mcg 4 days a week, 37.5 mcg 3 days a week

## 2024-04-08 NOTE — Assessment & Plan Note (Signed)
 Chronic Follows with cardiology Continue carvedilol  3.125 mg twice daily and Eliquis  2.5 mg twice daily CBC, CMP, TSH

## 2024-04-08 NOTE — Assessment & Plan Note (Signed)
 Chronic Taking vitamin D daily Check vitamin D level

## 2024-04-08 NOTE — Assessment & Plan Note (Signed)
 Chronic Intermittent ?  Related to GERD versus other Continue Zofran  every 8 hours as needed Continue Tums as needed

## 2024-04-08 NOTE — Assessment & Plan Note (Addendum)
 Chronic GERD controlled, but she does have intermittent feeling of sickness or nausea which is likely GERD Continue omeprazole  40 mg twice daily as needed Tums does help-okay to continue Tums as needed

## 2024-04-09 LAB — FERRITIN: Ferritin: 19.5 ng/mL (ref 10.0–291.0)

## 2024-04-09 LAB — VITAMIN D 25 HYDROXY (VIT D DEFICIENCY, FRACTURES): VITD: 25.54 ng/mL — ABNORMAL LOW (ref 30.00–100.00)

## 2024-04-09 LAB — TSH: TSH: 4.67 u[IU]/mL (ref 0.35–5.50)

## 2024-04-10 ENCOUNTER — Ambulatory Visit: Payer: Self-pay | Admitting: Internal Medicine

## 2024-04-17 ENCOUNTER — Ambulatory Visit

## 2024-04-21 ENCOUNTER — Other Ambulatory Visit: Payer: Self-pay | Admitting: Internal Medicine

## 2024-05-06 ENCOUNTER — Ambulatory Visit: Attending: Cardiology | Admitting: Cardiology

## 2024-05-06 ENCOUNTER — Encounter: Payer: Self-pay | Admitting: Cardiology

## 2024-05-06 VITALS — BP 155/78 | HR 80 | Ht <= 58 in | Wt 128.7 lb

## 2024-05-06 DIAGNOSIS — I422 Other hypertrophic cardiomyopathy: Secondary | ICD-10-CM | POA: Diagnosis not present

## 2024-05-06 DIAGNOSIS — I4821 Permanent atrial fibrillation: Secondary | ICD-10-CM | POA: Diagnosis not present

## 2024-05-06 DIAGNOSIS — I10 Essential (primary) hypertension: Secondary | ICD-10-CM | POA: Insufficient documentation

## 2024-05-06 NOTE — Progress Notes (Signed)
 Cardiology Office Note:  .   Date:  05/06/2024  ID:  [name], DOB Apr 16, 1928, MRN [id number] PCP: Geofm Glade PARAS, MD  [facility] Providers Cardiologist:  Oneil Parchment, MD     History of Present Illness: .   [name] is a 89 y.o. female Discussed the use of AI scribe  History of Present Illness [name] is a 89 year old female with permanent atrial fibrillation and apical variant hypertrophic cardiomyopathy who presents for follow-up. She is accompanied by her daughter. She was referred by a friend, [name].  She experiences shortness of breath during activities such as showering and dressing, which resolves after resting for a few minutes. Additionally, she experiences nausea after eating, which she attributes to her congestive heart failure and medications.  She is on chronic anticoagulation with Eliquis  2.5 mg twice a day. She also takes carvedilol  3.125 mg twice a day and torsemide  10 mg every other day as needed. An echocardiogram shows an ejection fraction of 40-45% with mildly decreased function, severe asymmetric left ventricular hypertrophy of the apical segment, and severe dilation of the left atrium.  She has a history of iron  deficiency anemia with a prior hemoglobin level of 9.7. She lives alone, takes care of herself, and prefers to be independent, although she acknowledges that she does not get out much and needs to be careful to avoid falls. She has a history of falls, including one that resulted in a broken arm and another that required stitches.  She mentions having grandchildren and recently welcomed twin great-grandchildren. She wants to remain active and acknowledges the importance of staying as active as possible, despite her age.       Studies Reviewed: .        Results LABS Hemoglobin: 9.7 g/dL  DIAGNOSTIC Echocardiogram: Ejection Fraction (EF) 40-45%, mildly decreased function, severe asymmetric left ventricular hypertrophy  of the apical segment, severe dilation of the left atrium Risk Assessment/Calculations:           Physical Exam:   VS:  BP (!) 155/78   Pulse 80   Ht 4' 10 (1.473 m)   Wt 128 lb 11.2 oz (58.4 kg)   LMP  (LMP Unknown) Comment: not sexually  SpO2 99%   BMI 26.90 kg/m    Wt Readings from Last 3 Encounters:  05/06/24 128 lb 11.2 oz (58.4 kg)  04/08/24 127 lb (57.6 kg)  01/09/24 119 lb (54 kg)    GEN: Well nourished, well developed in no acute distress, in wheelchair NECK: No JVD; No carotid bruits CARDIAC: irreg, 1/6 SM, no rubs, no gallops RESPIRATORY:  Clear to auscultation without rales, wheezing or rhonchi  ABDOMEN: Soft, non-tender, non-distended EXTREMITIES:  No edema; No deformity   ASSESSMENT AND PLAN: .    Assessment and Plan Assessment & Plan Permanent atrial fibrillation Chronic condition managed with anticoagulation therapy. - Continue Eliquis  2.5 mg twice daily for anticoagulation.  Apical variant hypertrophic cardiomyopathy Severe asymmetric left ventricular hypertrophy of the apical segment. Managed with carvedilol . - Continue carvedilol  3.125 mg twice daily.  Chronic heart failure with mildly decreased ejection fraction Chronic heart failure with ejection fraction of 40-45%. Symptoms include mild dyspnea on exertion and occasional nausea, possibly related to medication or age. No significant fluid overload or pulmonary congestion noted on examination. - Continue torsemide  10 mg every other day as needed for fluid management. - Encouraged safe physical activity to maintain mobility and prevent falls.  Dispo: 1 yr  Signed, Oneil Parchment, MD

## 2024-05-06 NOTE — Patient Instructions (Signed)
 Medication Instructions:  No medication changes were made at this visit. Continue current regimen.   *If you need a refill on your cardiac medications before your next appointment, please call your pharmacy*  Lab Work: None ordered today. If you have labs (blood work) drawn today and your tests are completely normal, you will receive your results only by: MyChart Message (if you have MyChart) OR A paper copy in the mail If you have any lab test that is abnormal or we need to change your treatment, we will call you to review the results.  Testing/Procedures: None ordered today.  Follow-Up: At [facility], you and your health needs are our priority.  As part of our continuing mission to provide you with exceptional heart care, our providers are all part of one team.  This team includes your primary Cardiologist (physician) and Advanced Practice Providers or APPs (Physician Assistants and Nurse Practitioners) who all work together to provide you with the care you need, when you need it.  Your next appointment:   1 year(s)  Provider:   Oneil Parchment, MD

## 2024-05-09 ENCOUNTER — Other Ambulatory Visit: Payer: Self-pay | Admitting: Internal Medicine

## 2024-05-13 ENCOUNTER — Ambulatory Visit: Admitting: Radiology

## 2024-05-25 ENCOUNTER — Ambulatory Visit: Admitting: Radiology

## 2024-06-10 ENCOUNTER — Encounter: Payer: Self-pay | Admitting: Radiology

## 2024-06-10 ENCOUNTER — Ambulatory Visit: Admitting: Radiology

## 2024-06-10 VITALS — BP 134/76

## 2024-06-10 DIAGNOSIS — Z4689 Encounter for fitting and adjustment of other specified devices: Secondary | ICD-10-CM | POA: Diagnosis not present

## 2024-06-10 NOTE — Progress Notes (Signed)
" ° °  Subjective:     History of Present Illness: [name] is a 89 y.o. female with stage III pelvic organ prolapse who presents for a pessary check. She is using a size 4 ring with support pessary. The pessary has been working well. She is not using vaginal estrogen. She denies vaginal bleeding. Had some pink discharge a few weeks ago, none since.  Past Medical History: Patient  has a past medical history of Adrenal myelolipoma, Apical variant hypertrophic cardiomyopathy (HCC), Chronic kidney disease, DIVERTICULOSIS, COLON, Dysmetabolic syndrome X, FIBROMYALGIA, Gastric ulcer due to Helicobacter pylori, Hiatal hernia, cataract surgery (01/08/2016), HYPERLIPIDEMIA, Hyperplastic colon polyp, HYPERTENSION, ESSENTIAL NOS, Hyponatremia, Hypothyroidism, MYCOSIS FUNGOIDES, Nodule of left lung, Normocytic anemia, Olecranon fracture (01/07/2016), OSTEOARTHRITIS, OSTEOPOROSIS, Persistent atrial fibrillation (HCC), Renal angiomyolipoma, and Unspecified vitamin D  deficiency.   Past Surgical History: She  has a past surgical history that includes G 4 P 2; colonoscopy with polypectomy (07/25/2007); ORIF elbow fracture (Right, 01/08/2016); I & D extremity (Right, 01/08/2016); and Cardioversion (N/A, 01/03/2017).   Medications: She has a current medication list which includes the following prescription(s): carvedilol , cholecalciferol , eliquis , ferrous sulfate , levothyroxine , lorazepam , omeprazole , ondansetron , torsemide , and cyanocobalamin .   Allergies: Patient is allergic to achromycin [tetracycline hcl], celecoxib, clarithromycin , ezetimibe-simvastatin , flagyl  [metronidazole ], penicillins, simvastatin , clonazepam , erythromycin, sertraline , tramadol , and abilify  [aripiprazole ].       Objective:    Physical Exam: BP 134/76 (BP Location: Left Arm, Patient Position: Sitting, Cuff Size: Normal)   LMP  (LMP Unknown) Comment: not sexually Gen: No apparent distress, A&O x 3. Detailed Urogynecologic Evaluation:   Pelvic Exam: Normal external female genitalia; Bartholin's and Skene's glands normal in appearance; urethral meatus normal in appearance, no urethral masses + discharge. The pessary was noted to be in place. It was removed and cleaned. Speculum exam revealed no lesions in the vagina. The pessary was replaced. It was comfortable to the patient and fit well.   .  Assessment/Plan:    1. Pessary maintenance  She will keep the pessary in place until next visit.  She will follow-up in 3 months for a pessary check or sooner as needed.  All questions were answered.  Tashaun Obey B, NP 2:25 PM   "

## 2024-06-12 ENCOUNTER — Other Ambulatory Visit: Payer: Self-pay | Admitting: Internal Medicine

## 2024-07-04 ENCOUNTER — Other Ambulatory Visit: Payer: Self-pay | Admitting: Internal Medicine

## 2024-07-07 MED ORDER — ONDANSETRON HCL 4 MG PO TABS: 4.0000 mg | ORAL_TABLET | Freq: Three times a day (TID) | ORAL | 2 refills | Status: AC | PRN

## 2024-07-07 NOTE — Addendum Note (Signed)
 Addended by: GEOFM GLADE PARAS on: 07/07/2024 04:23 PM   Modules accepted: Orders

## 2024-09-10 ENCOUNTER — Ambulatory Visit: Admitting: Radiology

## 2024-10-06 ENCOUNTER — Ambulatory Visit: Admitting: Internal Medicine
